# Patient Record
Sex: Male | Born: 1965 | Race: Black or African American | Hispanic: No | Marital: Married | Smoking: Former smoker
Health system: Southern US, Community
[De-identification: ages and names within clinical notes are randomized; demographics above are authoritative.]

## PROBLEM LIST (undated history)

## (undated) DIAGNOSIS — H30033 Focal chorioretinal inflammation, peripheral, bilateral: Secondary | ICD-10-CM

## (undated) DIAGNOSIS — H35063 Retinal vasculitis, bilateral: Secondary | ICD-10-CM

## (undated) DIAGNOSIS — K219 Gastro-esophageal reflux disease without esophagitis: Secondary | ICD-10-CM

## (undated) DIAGNOSIS — N400 Enlarged prostate without lower urinary tract symptoms: Secondary | ICD-10-CM

## (undated) DIAGNOSIS — T7840XA Allergy, unspecified, initial encounter: Secondary | ICD-10-CM

## (undated) DIAGNOSIS — D869 Sarcoidosis, unspecified: Secondary | ICD-10-CM

## (undated) DIAGNOSIS — N529 Male erectile dysfunction, unspecified: Secondary | ICD-10-CM

## (undated) DIAGNOSIS — M1990 Unspecified osteoarthritis, unspecified site: Secondary | ICD-10-CM

## (undated) DIAGNOSIS — H579 Unspecified disorder of eye and adnexa: Secondary | ICD-10-CM

## (undated) HISTORY — DX: Sarcoidosis, unspecified: D86.9

## (undated) HISTORY — DX: Unspecified osteoarthritis, unspecified site: M19.90

---

## 1999-04-09 ENCOUNTER — Emergency Department (HOSPITAL_COMMUNITY): Admission: EM | Admit: 1999-04-09 | Discharge: 1999-04-09 | Payer: Self-pay | Admitting: Emergency Medicine

## 2008-12-21 ENCOUNTER — Emergency Department (HOSPITAL_COMMUNITY): Admission: EM | Admit: 2008-12-21 | Discharge: 2008-12-21 | Payer: Self-pay | Admitting: Emergency Medicine

## 2010-05-07 LAB — RAPID STREP SCREEN (MED CTR MEBANE ONLY): Streptococcus, Group A Screen (Direct): POSITIVE — AB

## 2012-01-27 ENCOUNTER — Emergency Department (HOSPITAL_COMMUNITY)
Admission: EM | Admit: 2012-01-27 | Discharge: 2012-01-27 | Disposition: A | Discharge status: Home / Self Care | Payer: Self-pay | Attending: Emergency Medicine | Admitting: Emergency Medicine

## 2012-01-27 ENCOUNTER — Encounter (HOSPITAL_COMMUNITY): Payer: Self-pay | Admitting: Emergency Medicine

## 2012-01-27 DIAGNOSIS — Z20828 Contact with and (suspected) exposure to other viral communicable diseases: Secondary | ICD-10-CM | POA: Insufficient documentation

## 2012-01-27 DIAGNOSIS — Z202 Contact with and (suspected) exposure to infections with a predominantly sexual mode of transmission: Secondary | ICD-10-CM

## 2012-01-27 DIAGNOSIS — Z8619 Personal history of other infectious and parasitic diseases: Secondary | ICD-10-CM | POA: Insufficient documentation

## 2012-01-27 MED ORDER — LIDOCAINE HCL 2 % IJ SOLN
INTRAMUSCULAR | Status: AC
  Administered 2012-01-27: 20 mg
  Filled 2012-01-27: qty 20

## 2012-01-27 MED ORDER — AZITHROMYCIN 250 MG PO TABS
1000.0000 mg | ORAL_TABLET | Freq: Once | ORAL | Status: AC
  Administered 2012-01-27: 1000 mg via ORAL
  Filled 2012-01-27: qty 4

## 2012-01-27 MED ORDER — CEFTRIAXONE SODIUM 1 G IJ SOLR
1.0000 g | Freq: Once | INTRAMUSCULAR | Status: AC
  Administered 2012-01-27: 1 g via INTRAMUSCULAR
  Filled 2012-01-27: qty 10

## 2012-01-27 NOTE — ED Notes (Signed)
Pain on urination x 1 week. Reports cloudy discharge from penis

## 2012-01-27 NOTE — ED Provider Notes (Signed)
Medical screening examination/treatment/procedure(s) were performed by non-physician practitioner and as supervising physician I was immediately available for consultation/collaboration.  Yazlyn Wentzel, MD 01/27/12 1513 

## 2012-01-27 NOTE — ED Provider Notes (Signed)
History     CSN: [id number]  Arrival date & time 01/27/12  1217   First MD Initiated Contact with Patient 01/27/12 1233      Chief Complaint  Patient presents with  . Penile Discharge    1 week hx of discharge from penis    (Consider location/radiation/quality/duration/timing/severity/associated sxs/prior treatment) HPI Comments: 46 year old male presents emergency department with concern of sexually transmitted disease. States he was "messing around with the girl who slept with an ex who had STDs" last week. He is coming from the past and tested positive for both gonorrhea and Chlamydia. States he has cloudy penile discharge. Denies pain or tenderness. Despite triage summary patient denies dysuria. Denies increased frequency or urgency. No testicular pain or swelling. No fever or chills.  Patient is a 46 y.o. male presenting with penile discharge. The history is provided by the patient.  Penile Discharge Pertinent negatives include no abdominal pain, chills or fever.    No past medical history on file.  No past surgical history on file.  No family history on file.  History  Substance Use Topics  . Smoking status: Not on file  . Smokeless tobacco: Not on file  . Alcohol Use: Not on file      Review of Systems  Constitutional: Negative for fever and chills.  Gastrointestinal: Negative for abdominal pain.  Genitourinary: Positive for discharge. Negative for frequency, penile swelling, scrotal swelling, penile pain and testicular pain.  Skin: Negative.     Allergies  Review of patient's allergies indicates no known allergies.  Home Medications  No current outpatient prescriptions on file.  There were no vitals taken for this visit.  Physical Exam  Constitutional: He is oriented to person, place, and time. He appears well-developed and well-nourished. No distress.  HENT:  Head: Normocephalic and atraumatic.  Mouth/Throat: Oropharynx is clear and moist.  Eyes:  Conjunctivae normal are normal.  Neck: Normal range of motion. Neck supple.  Cardiovascular: Normal rate, regular rhythm and normal heart sounds.   Pulmonary/Chest: Effort normal and breath sounds normal.  Genitourinary: Penis normal. Right testis shows no swelling and no tenderness. Left testis shows no swelling and no tenderness. Circumcised. No penile erythema or penile tenderness. No discharge found.  Musculoskeletal: Normal range of motion.  Neurological: He is alert and oriented to person, place, and time.  Skin: Skin is warm and dry. No rash noted.  Psychiatric: He has a normal mood and affect. His behavior is normal.    ED Course  Procedures (including critical care time)   Labs Reviewed  GC/CHLAMYDIA PROBE AMP   No results found.   1. Exposure to STD       MDM  46 year old male with exposure to sexually transmitted disease. Penile cultures obtained. Patient received 1 g IM Rocephin and 1000 mg azithromycin. He is aware if results are positive he needs to tell his partner. Return precautions discussed.       Trevor Mace, PA-C 01/27/12 1257

## 2012-01-28 LAB — GC/CHLAMYDIA PROBE AMP
CT Probe RNA: NEGATIVE
GC Probe RNA: NEGATIVE

## 2012-02-10 ENCOUNTER — Encounter (HOSPITAL_COMMUNITY): Payer: Self-pay | Admitting: Emergency Medicine

## 2012-02-10 ENCOUNTER — Emergency Department (HOSPITAL_COMMUNITY)
Admission: EM | Admit: 2012-02-10 | Discharge: 2012-02-10 | Disposition: A | Discharge status: Home / Self Care | Payer: Self-pay | Attending: Emergency Medicine | Admitting: Emergency Medicine

## 2012-02-10 DIAGNOSIS — R300 Dysuria: Secondary | ICD-10-CM | POA: Insufficient documentation

## 2012-02-10 DIAGNOSIS — F17200 Nicotine dependence, unspecified, uncomplicated: Secondary | ICD-10-CM | POA: Insufficient documentation

## 2012-02-10 LAB — URINALYSIS, ROUTINE W REFLEX MICROSCOPIC
Nitrite: NEGATIVE
Specific Gravity, Urine: 1.023 (ref 1.005–1.030)
Urobilinogen, UA: 0.2 mg/dL (ref 0.0–1.0)
pH: 6.5 (ref 5.0–8.0)

## 2012-02-10 MED ORDER — DOXYCYCLINE HYCLATE 100 MG PO CAPS: 100.0000 mg | ORAL_CAPSULE | Freq: Two times a day (BID) | ORAL | Status: DC

## 2012-02-10 MED ORDER — DOXYCYCLINE HYCLATE 100 MG PO TABS: 100.0000 mg | ORAL_TABLET | Freq: Once | ORAL | Status: DC

## 2012-02-10 NOTE — ED Provider Notes (Signed)
Medical screening examination/treatment/procedure(s) were performed by non-physician practitioner and as supervising physician I was immediately available for consultation/collaboration.    Nelia Shi, MD 02/10/12 1350

## 2012-02-10 NOTE — ED Notes (Addendum)
Pt reports pressure and urgency on urination x 3 days

## 2012-02-10 NOTE — ED Provider Notes (Signed)
History     CSN: [id number]  Arrival date & time 02/10/12  1059   First MD Initiated Contact with Patient 02/10/12 1116      Chief Complaint  Patient presents with  . Penile Discharge    discharge via penis. denies sexual contact    (Consider location/radiation/quality/duration/timing/severity/associated sxs/prior treatment) HPI The patient presents to the emergency department with continued dysuria.  Patient states he thinks he needs more antibiotics.  Patient denies fever, nausea, vomiting, abdominal pain, back pain, diarrhea, headache, chest pain, or shortness of breath.  Patient states she did not take any other medications prior to arrival.  He says nothing seems to make his condition, better or worse. History reviewed. No pertinent past medical history.  History reviewed. No pertinent past surgical history.  Family History  Problem Relation Age of Onset  . Diabetes Mother     History  Substance Use Topics  . Smoking status: Current Every Day Smoker    Types: Cigarettes  . Smokeless tobacco: Not on file  . Alcohol Use: Yes      Review of Systems All other systems negative except as documented in the HPI. All pertinent positives and negatives as reviewed in the HPI. Allergies  Review of patient's allergies indicates no known allergies.  Home Medications  No current outpatient prescriptions on file.  BP 119/76  Pulse 84  Temp 97.9 F (36.6 C) (Oral)  Resp 18  SpO2 99%  Physical Exam  Constitutional: He is oriented to person, place, and time. He appears well-developed and well-nourished. No distress.  HENT:  Head: Normocephalic and atraumatic.  Mouth/Throat: Oropharynx is clear and moist.  Eyes: Pupils are equal, round, and reactive to light.  Neck: Normal range of motion. Neck supple.  Cardiovascular: Normal rate, regular rhythm and normal heart sounds.  Exam reveals no gallop and no friction rub.   No murmur heard. Pulmonary/Chest: Effort normal and  breath sounds normal.  Genitourinary: Penis normal. No penile tenderness.  Neurological: He is alert and oriented to person, place, and time.    ED Course  Procedures (including critical care time)  Patient will be treated with doxycycline and advised to return here as needed.  Will be given urology followup.  Told to increase his fluid intake, as much  As possible  MDM          Carlyle Dolly, PA-C 02/10/12 1325

## 2012-02-10 NOTE — ED Notes (Signed)
PA at bedside. Pt given explanation about need to antibiotic to help any residual sx of STD. Referal reviewed with pt. Pt markedly more relaxed

## 2012-02-10 NOTE — ED Notes (Signed)
Pt is very concerned about infection. Pt has multiple questions. Not satisfied with answers by this RN. Referred to PA

## 2013-01-30 ENCOUNTER — Emergency Department (HOSPITAL_BASED_OUTPATIENT_CLINIC_OR_DEPARTMENT_OTHER)
Admission: EM | Admit: 2013-01-30 | Discharge: 2013-01-30 | Disposition: A | Discharge status: Home / Self Care | Payer: Self-pay | Attending: Emergency Medicine | Admitting: Emergency Medicine

## 2013-01-30 ENCOUNTER — Encounter (HOSPITAL_BASED_OUTPATIENT_CLINIC_OR_DEPARTMENT_OTHER): Payer: Self-pay | Admitting: Emergency Medicine

## 2013-01-30 DIAGNOSIS — Z792 Long term (current) use of antibiotics: Secondary | ICD-10-CM | POA: Insufficient documentation

## 2013-01-30 DIAGNOSIS — Z0000 Encounter for general adult medical examination without abnormal findings: Secondary | ICD-10-CM

## 2013-01-30 DIAGNOSIS — Z711 Person with feared health complaint in whom no diagnosis is made: Secondary | ICD-10-CM | POA: Insufficient documentation

## 2013-01-30 DIAGNOSIS — F17200 Nicotine dependence, unspecified, uncomplicated: Secondary | ICD-10-CM | POA: Insufficient documentation

## 2013-01-30 LAB — URINALYSIS, ROUTINE W REFLEX MICROSCOPIC
Leukocytes, UA: NEGATIVE
Nitrite: NEGATIVE
Protein, ur: NEGATIVE mg/dL
Specific Gravity, Urine: 1.027 (ref 1.005–1.030)
Urobilinogen, UA: 1 mg/dL (ref 0.0–1.0)

## 2013-01-30 NOTE — ED Provider Notes (Signed)
CSN: [id number]     Arrival date & time 01/30/13  9562 History   First MD Initiated Contact with Patient 01/30/13 0300     Chief Complaint  Patient presents with  . Urinary Frequency   (Consider location/radiation/quality/duration/timing/severity/associated sxs/prior Treatment) Patient is a 47 y.o. male presenting with frequency. The history is provided by the patient.  Urinary Frequency This is a chronic problem. The current episode started more than 1 week ago (at least 2 months). The problem occurs constantly. The problem has not changed since onset.Pertinent negatives include no chest pain, no abdominal pain, no headaches and no shortness of breath. Nothing aggravates the symptoms. Nothing relieves the symptoms. He has tried nothing for the symptoms. The treatment provided no relief.  No flank pain no penile discharge no exposure to STI.  No fevers no foul smelling urine  History reviewed. No pertinent past medical history. History reviewed. No pertinent past surgical history. Family History  Problem Relation Age of Onset  . Diabetes Mother    History  Substance Use Topics  . Smoking status: Current Every Day Smoker    Types: Cigars  . Smokeless tobacco: Not on file  . Alcohol Use: 3.6 oz/week    6 Cans of beer per week    Review of Systems  Respiratory: Negative for shortness of breath.   Cardiovascular: Negative for chest pain.  Gastrointestinal: Negative for abdominal pain.  Genitourinary: Positive for frequency. Negative for dysuria, hematuria, discharge, scrotal swelling, penile pain and testicular pain.  Neurological: Negative for headaches.  All other systems reviewed and are negative.    Allergies  Review of patient's allergies indicates no known allergies.  Home Medications   Current Outpatient Rx  Name  Route  Sig  Dispense  Refill  . doxycycline (VIBRAMYCIN) 100 MG capsule   Oral   Take 1 capsule (100 mg total) by mouth 2 (two) times daily.   20  capsule   0    BP 138/84  Pulse 87  Temp(Src) 98.6 F (37 C) (Oral)  Resp 20  Ht 5\' 8"  (1.727 m)  Wt 190 lb (86.183 kg)  BMI 28.90 kg/m2  SpO2 98% Physical Exam  Constitutional: He is oriented to person, place, and time. He appears well-developed and well-nourished. No distress.  HENT:  Head: Normocephalic and atraumatic.  Mouth/Throat: Oropharynx is clear and moist.  Eyes: Conjunctivae are normal. Pupils are equal, round, and reactive to light.  Neck: Normal range of motion. Neck supple.  Cardiovascular: Normal rate, regular rhythm and intact distal pulses.   Pulmonary/Chest: Effort normal and breath sounds normal. He has no wheezes. He has no rales.  Abdominal: Soft. Bowel sounds are normal. There is no tenderness. There is no rebound and no guarding.  Musculoskeletal: Normal range of motion.  Neurological: He is alert and oriented to person, place, and time.  Skin: Skin is warm and dry.  Psychiatric: He has a normal mood and affect.    ED Course  Procedures (including critical care time) Labs Review Labs Reviewed  URINALYSIS, ROUTINE W REFLEX MICROSCOPIC   Imaging Review No results found.  EKG Interpretation   None       MDM  No diagnosis found. Post void residuals normal.  No UTI.  Follow up with urology for ongoing evaluation    Brooks Stotz K Myrene Bougher-Rasch, MD 01/30/13 [phone]

## 2013-01-30 NOTE — ED Notes (Signed)
Pt complains of frequent urination for 2 months.  Reports the he doesn't feel he is emptying his bladder completely.  Denies dysuria.

## 2015-12-05 ENCOUNTER — Emergency Department (HOSPITAL_COMMUNITY)
Admission: EM | Admit: 2015-12-05 | Discharge: 2015-12-05 | Disposition: A | Discharge status: Home / Self Care | Payer: Self-pay | Attending: Emergency Medicine | Admitting: Emergency Medicine

## 2015-12-05 ENCOUNTER — Emergency Department (HOSPITAL_COMMUNITY): Payer: Self-pay

## 2015-12-05 ENCOUNTER — Encounter (HOSPITAL_COMMUNITY): Payer: Self-pay | Admitting: Emergency Medicine

## 2015-12-05 DIAGNOSIS — R0789 Other chest pain: Secondary | ICD-10-CM | POA: Insufficient documentation

## 2015-12-05 LAB — COMPREHENSIVE METABOLIC PANEL
ALK PHOS: 68 U/L (ref 38–126)
ALT: 14 U/L — AB (ref 17–63)
AST: 18 U/L (ref 15–41)
Albumin: 3.4 g/dL — ABNORMAL LOW (ref 3.5–5.0)
Anion gap: 5 (ref 5–15)
BUN: 11 mg/dL (ref 6–20)
CALCIUM: 9.1 mg/dL (ref 8.9–10.3)
CO2: 27 mmol/L (ref 22–32)
CREATININE: 1.14 mg/dL (ref 0.61–1.24)
Chloride: 108 mmol/L (ref 101–111)
GFR calc non Af Amer: >60 mL/min (ref >60)
GLUCOSE: 94 mg/dL (ref 65–99)
Potassium: 4.7 mmol/L (ref 3.5–5.1)
SODIUM: 140 mmol/L (ref 135–145)
Total Bilirubin: 0.4 mg/dL (ref 0.3–1.2)
Total Protein: 6.6 g/dL (ref 6.5–8.1)

## 2015-12-05 LAB — CBC WITH DIFFERENTIAL/PLATELET
BASOS PCT: 0 %
Basophils Absolute: 0 10*3/uL (ref 0.0–0.1)
EOS ABS: 0.1 10*3/uL (ref 0.0–0.7)
EOS PCT: 2 %
HCT: 39.9 % (ref 39.0–52.0)
Hemoglobin: 12.8 g/dL — ABNORMAL LOW (ref 13.0–17.0)
Lymphocytes Relative: 40 %
Lymphs Abs: 2.3 10*3/uL (ref 0.7–4.0)
MCH: 26.7 pg (ref 26.0–34.0)
MCHC: 32.1 g/dL (ref 30.0–36.0)
MCV: 83.3 fL (ref 78.0–100.0)
MONO ABS: 0.5 10*3/uL (ref 0.1–1.0)
MONOS PCT: 9 %
NEUTROS PCT: 49 %
Neutro Abs: 2.8 10*3/uL (ref 1.7–7.7)
PLATELETS: 251 10*3/uL (ref 150–400)
RBC: 4.79 MIL/uL (ref 4.22–5.81)
RDW: 14.3 % (ref 11.5–15.5)
WBC: 5.8 10*3/uL (ref 4.0–10.5)

## 2015-12-05 LAB — I-STAT TROPONIN, ED: TROPONIN I, POC: 0 ng/mL (ref 0.00–0.08)

## 2015-12-05 LAB — TROPONIN I: Troponin I: <0.03 ng/mL (ref <0.03)

## 2015-12-05 MED ORDER — ASPIRIN 81 MG PO CHEW
324.0000 mg | CHEWABLE_TABLET | Freq: Once | ORAL | Status: AC
  Administered 2015-12-05: 324 mg via ORAL
  Filled 2015-12-05: qty 4

## 2015-12-05 MED ORDER — METHOCARBAMOL 500 MG PO TABS: 500.0000 mg | ORAL_TABLET | Freq: Two times a day (BID) | ORAL | 0 refills | Status: DC | PRN

## 2015-12-05 NOTE — Discharge Instructions (Signed)
Follow up with your primary care provider for discussion of today's diagnosis.  Robaxin is your muscle relaxer to take as needed. You can also take ibuprofen along with this medication.  Return to ER for new or worsening symptoms, any additional concerns.

## 2015-12-05 NOTE — ED Provider Notes (Signed)
[facility]-EMERGENCY DEPT Provider Note   CSN: [id number] Arrival date & time: 12/05/15  1202     History   Chief Complaint Chief Complaint  Patient presents with  . Chest Pain    HPI Nathan [name] is a 50 y.o. male.  The history is provided by the patient. No language interpreter was used.  Chest Pain   This is a new problem. The problem occurs constantly. The problem has been gradually worsening. The pain is moderate. The pain does not radiate. Pertinent negatives include no cough and no shortness of breath. He has tried nothing for the symptoms. The treatment provided no relief. There are no known risk factors.  Pt complains of pain in his right chest that started this morning.  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    History reviewed. No pertinent past medical history.  There are no active problems to display for this patient.   History reviewed. No pertinent surgical history.     Home Medications    Prior to Admission medications   Not on File    Family History History reviewed. No pertinent family history.  Social History Social History  Substance Use Topics  . Smoking status: Not on file  . Smokeless tobacco: Not on file  . Alcohol use Not on file     Allergies   Review of patient's allergies indicates no known allergies.   Review of Systems Review of Systems  Respiratory: Negative for cough and shortness of breath.   Cardiovascular: Positive for chest pain.  All other systems reviewed and are negative.    Physical Exam Updated Vital Signs BP 146/92 (BP Location: Right Arm)   Pulse (!) 55   Temp 98.4 F (36.9 C) (Oral)   Resp 19   SpO2 100%   Physical Exam  Constitutional: He appears well-developed and well-nourished.  HENT:  Head: Normocephalic and atraumatic.  Eyes: Conjunctivae are normal.  Neck: Neck supple.  Cardiovascular: Normal rate and regular rhythm.   No murmur heard. asmetry chest. Right chest larger/firmer chest  muscle  ? Spasm.    Pulmonary/Chest: Effort normal and breath sounds normal. No respiratory distress.  Abdominal: Soft. There is no tenderness.  Musculoskeletal: He exhibits no edema.  Neurological: He is alert.  Skin: Skin is warm and dry.  Psychiatric: He has a normal mood and affect.  Nursing note and vitals reviewed.    ED Treatments / Results  Labs (all labs ordered are listed, but only abnormal results are displayed) Labs Reviewed  COMPREHENSIVE METABOLIC PANEL - Abnormal; Notable for the following:       Result Value   Albumin 3.4 (*)    ALT 14 (*)    All other components within normal limits  CBC WITH DIFFERENTIAL/PLATELET - Abnormal; Notable for the following:    Hemoglobin 12.8 (*)    All other components within normal limits  TROPONIN I  I-STAT TROPOININ, ED    EKG  EKG Interpretation  Date/Time:  Thursday December 05 2015 12:06:40 EDT Ventricular Rate:  61 PR Interval:  122 QRS Duration: 90 QT Interval:  390 QTC Calculation: 392 R Axis:   74 Text Interpretation:  Normal sinus rhythm Artifact Abnormal ekg Confirmed by Gerhard MunchLOCKWOOD, ROBERT  MD [phone]([id number]) on 12/05/2015 12:13:36 PM       Radiology Dg Chest 2 View  Result Date: 12/05/2015 CLINICAL DATA:  Chest pain EXAM: CHEST  2 VIEW COMPARISON:  None. FINDINGS: The heart size and mediastinal contours are within  normal limits. Both lungs are clear. The visualized skeletal structures are unremarkable. IMPRESSION: No active cardiopulmonary disease. Electronically Signed   By: Marlan Palau M.D.   On: 12/05/2015 12:47    Procedures Procedures (including critical care time)  Medications Ordered in ED Medications  aspirin chewable tablet 324 mg (not administered)     Initial Impression / Assessment and Plan / ED Course  I have reviewed the triage vital signs and the nursing notes.  Pertinent labs & imaging results that were available during my care of the patient were reviewed by me and considered in my medical  decision making (see chart for details).  Clinical Course    I suspect pain is muscular.  Pt appears to have right sides chest muscle spasm.  Final Clinical Impressions(s) / ED Diagnoses   Final diagnoses:  Chest wall pain    New Prescriptions New Prescriptions   No medications on file     Elson Areas, PA-C 12/05/15 1558    Charlynne Pander, MD 12/06/15 [phone]

## 2015-12-05 NOTE — ED Triage Notes (Addendum)
Pt states chest pain on right side, nonradiating with SOB starting this morning. Denies dizziness or nausea. Nonsmoker. Reports had another episode similar but could not find anything on stress test few years ago. Does do some heavy lifting for job. Denies cough. Does get worse with movement.

## 2015-12-05 NOTE — ED Notes (Signed)
Pt verbalized understanding of d/c instructions and has no further questions. Pt stable and NAD. Pt d/c home with wife. Pt ambulatory, VSS.

## 2017-11-15 ENCOUNTER — Other Ambulatory Visit: Payer: Self-pay | Admitting: Family Medicine

## 2017-11-15 ENCOUNTER — Ambulatory Visit
Admission: RE | Admit: 2017-11-15 | Discharge: 2017-11-15 | Disposition: A | Discharge status: Home / Self Care | Payer: Managed Care, Other (non HMO) | Source: Ambulatory Visit | Attending: Family Medicine | Admitting: Family Medicine

## 2017-11-15 DIAGNOSIS — H3581 Retinal edema: Secondary | ICD-10-CM

## 2019-06-30 ENCOUNTER — Emergency Department (HOSPITAL_BASED_OUTPATIENT_CLINIC_OR_DEPARTMENT_OTHER)
Admission: EM | Admit: 2019-06-30 | Discharge: 2019-06-30 | Disposition: A | Discharge status: Home / Self Care | Payer: Commercial Managed Care - PPO | Attending: Emergency Medicine | Admitting: Emergency Medicine

## 2019-06-30 ENCOUNTER — Emergency Department (HOSPITAL_BASED_OUTPATIENT_CLINIC_OR_DEPARTMENT_OTHER): Payer: Commercial Managed Care - PPO

## 2019-06-30 ENCOUNTER — Other Ambulatory Visit: Payer: Self-pay

## 2019-06-30 ENCOUNTER — Encounter (HOSPITAL_BASED_OUTPATIENT_CLINIC_OR_DEPARTMENT_OTHER): Payer: Self-pay | Admitting: *Deleted

## 2019-06-30 DIAGNOSIS — F17290 Nicotine dependence, other tobacco product, uncomplicated: Secondary | ICD-10-CM | POA: Insufficient documentation

## 2019-06-30 DIAGNOSIS — S2241XA Multiple fractures of ribs, right side, initial encounter for closed fracture: Secondary | ICD-10-CM | POA: Diagnosis not present

## 2019-06-30 DIAGNOSIS — Y999 Unspecified external cause status: Secondary | ICD-10-CM | POA: Diagnosis not present

## 2019-06-30 DIAGNOSIS — Y9289 Other specified places as the place of occurrence of the external cause: Secondary | ICD-10-CM | POA: Insufficient documentation

## 2019-06-30 DIAGNOSIS — R079 Chest pain, unspecified: Secondary | ICD-10-CM

## 2019-06-30 DIAGNOSIS — S270XXA Traumatic pneumothorax, initial encounter: Secondary | ICD-10-CM | POA: Insufficient documentation

## 2019-06-30 DIAGNOSIS — Y9389 Activity, other specified: Secondary | ICD-10-CM | POA: Diagnosis not present

## 2019-06-30 DIAGNOSIS — S299XXA Unspecified injury of thorax, initial encounter: Secondary | ICD-10-CM | POA: Diagnosis present

## 2019-06-30 MED ORDER — OXYCODONE-ACETAMINOPHEN 5-325 MG PO TABS
2.0000 | ORAL_TABLET | Freq: Once | ORAL | Status: AC
  Administered 2019-06-30: 2 via ORAL
  Filled 2019-06-30: qty 2

## 2019-06-30 MED ORDER — OXYCODONE-ACETAMINOPHEN 5-325 MG PO TABS
1.0000 | ORAL_TABLET | Freq: Once | ORAL | Status: AC
  Administered 2019-06-30: 1 via ORAL
  Filled 2019-06-30: qty 1

## 2019-06-30 MED ORDER — OXYCODONE-ACETAMINOPHEN 5-325 MG PO TABS: 1.0000 | ORAL_TABLET | Freq: Four times a day (QID) | ORAL | 0 refills | Status: DC | PRN

## 2019-06-30 MED ORDER — OXYCODONE-ACETAMINOPHEN 5-325 MG PO TABS
1.0000 | ORAL_TABLET | ORAL | Status: DC | PRN
  Administered 2019-06-30: 1 via ORAL
  Filled 2019-06-30: qty 1
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
## 2019-07-05 ENCOUNTER — Emergency Department (HOSPITAL_BASED_OUTPATIENT_CLINIC_OR_DEPARTMENT_OTHER): Payer: Commercial Managed Care - PPO

## 2019-07-05 ENCOUNTER — Emergency Department (HOSPITAL_BASED_OUTPATIENT_CLINIC_OR_DEPARTMENT_OTHER)
Admission: EM | Admit: 2019-07-05 | Discharge: 2019-07-05 | Disposition: A | Discharge status: Home / Self Care | Payer: Commercial Managed Care - PPO | Attending: Emergency Medicine | Admitting: Emergency Medicine

## 2019-07-05 ENCOUNTER — Other Ambulatory Visit: Payer: Self-pay

## 2019-07-05 ENCOUNTER — Encounter (HOSPITAL_BASED_OUTPATIENT_CLINIC_OR_DEPARTMENT_OTHER): Payer: Self-pay

## 2019-07-05 DIAGNOSIS — S2241XD Multiple fractures of ribs, right side, subsequent encounter for fracture with routine healing: Secondary | ICD-10-CM | POA: Diagnosis not present

## 2019-07-05 DIAGNOSIS — R0602 Shortness of breath: Secondary | ICD-10-CM | POA: Diagnosis present

## 2019-07-05 DIAGNOSIS — S2241XA Multiple fractures of ribs, right side, initial encounter for closed fracture: Secondary | ICD-10-CM

## 2019-07-05 DIAGNOSIS — F17210 Nicotine dependence, cigarettes, uncomplicated: Secondary | ICD-10-CM | POA: Insufficient documentation

## 2019-07-05 MED ORDER — KETOROLAC TROMETHAMINE 60 MG/2ML IM SOLN
60.0000 mg | Freq: Once | INTRAMUSCULAR | Status: DC
  Filled 2019-07-05: qty 2

## 2019-07-05 MED ORDER — OXYCODONE-ACETAMINOPHEN 5-325 MG PO TABS: 1.0000 | ORAL_TABLET | ORAL | 0 refills | Status: DC | PRN

## 2019-07-05 MED ORDER — HYDROMORPHONE HCL 1 MG/ML IJ SOLN: 1.0000 mg | Freq: Once | INTRAMUSCULAR | Status: DC
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
## 2019-07-05 NOTE — ED Notes (Signed)
ED Provider at bedside. 

## 2019-07-05 NOTE — ED Triage Notes (Signed)
States SOB since he was seen here last Friday, states coughing up thick mucus, chest is sore from coughing and has a 'knot like feeling'. 99% on RA

## 2019-07-06 ENCOUNTER — Encounter: Payer: Self-pay | Admitting: Family Medicine

## 2019-07-06 ENCOUNTER — Ambulatory Visit (INDEPENDENT_AMBULATORY_CARE_PROVIDER_SITE_OTHER): Payer: Commercial Managed Care - PPO | Admitting: Family Medicine

## 2019-07-06 ENCOUNTER — Ambulatory Visit: Payer: Self-pay | Attending: Internal Medicine

## 2019-07-06 VITALS — BP 117/80 | HR 92 | Ht 67.0 in | Wt 200.0 lb

## 2019-07-06 DIAGNOSIS — Z23 Encounter for immunization: Secondary | ICD-10-CM

## 2019-07-06 DIAGNOSIS — S270XXA Traumatic pneumothorax, initial encounter: Secondary | ICD-10-CM

## 2019-07-06 DIAGNOSIS — S2241XA Multiple fractures of ribs, right side, initial encounter for closed fracture: Secondary | ICD-10-CM | POA: Insufficient documentation

## 2019-07-06 MED ORDER — LIDOCAINE 5 % EX PTCH: 1.0000 | MEDICATED_PATCH | Freq: Two times a day (BID) | CUTANEOUS | 2 refills | Status: AC

## 2019-07-06 NOTE — Progress Notes (Signed)
   Covid-19 Vaccination Clinic  Name:  RAY GERVASI    MRN: [id number] DOB: 10-Jun-1965  07/06/2019  Mr. Pollio was observed post Covid-19 immunization for 15 minutes without incident. He was provided with Vaccine Information Sheet and instruction to access the V-Safe system.   Mr. Mcdermid was instructed to call 911 with any severe reactions post vaccine: [name] Kitchen Difficulty breathing  . Swelling of face and throat  . A fast heartbeat  . A bad rash all over body  . Dizziness and weakness   Immunizations Administered    Name Date Dose VIS Date Route   Pfizer COVID-19 Vaccine 07/06/2019  8:59 AM 0.3 mL 03/29/2018 Intramuscular   Manufacturer: [employer], [employer]   Lot: [id number]   NDC: [address]-2
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
## 2019-07-06 NOTE — Patient Instructions (Signed)
Nice to meet you Please try the rib belt  Please try ice Please be seen in the emergency department if you develop shortness of breath or have trouble catching your breath   Please send me a message in MyChart with any questions or updates.  Please see me back in 2 weeks.   --Dr. Jordan Likes

## 2019-07-20 ENCOUNTER — Ambulatory Visit (INDEPENDENT_AMBULATORY_CARE_PROVIDER_SITE_OTHER): Payer: Commercial Managed Care - PPO | Admitting: Family Medicine

## 2019-07-20 ENCOUNTER — Ambulatory Visit (HOSPITAL_BASED_OUTPATIENT_CLINIC_OR_DEPARTMENT_OTHER)
Admission: RE | Admit: 2019-07-20 | Discharge: 2019-07-20 | Disposition: A | Discharge status: Home / Self Care | Payer: Commercial Managed Care - PPO | Source: Ambulatory Visit | Attending: Family Medicine | Admitting: Family Medicine

## 2019-07-20 ENCOUNTER — Other Ambulatory Visit: Payer: Self-pay

## 2019-07-20 ENCOUNTER — Encounter: Payer: Self-pay | Admitting: Family Medicine

## 2019-07-20 VITALS — BP 131/99 | HR 80 | Ht 67.0 in | Wt 205.0 lb

## 2019-07-20 DIAGNOSIS — S2241XD Multiple fractures of ribs, right side, subsequent encounter for fracture with routine healing: Secondary | ICD-10-CM | POA: Diagnosis present

## 2019-07-20 DIAGNOSIS — S270XXD Traumatic pneumothorax, subsequent encounter: Secondary | ICD-10-CM | POA: Diagnosis not present

## 2019-07-20 MED ORDER — OXYCODONE-ACETAMINOPHEN 5-325 MG PO TABS
1.0000 | ORAL_TABLET | ORAL | 0 refills | Status: AC | PRN
Start: 2019-07-20 — End: ?
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
## 2019-07-20 NOTE — Assessment & Plan Note (Signed)
No shortness of breath. -X-ray
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
## 2019-07-20 NOTE — Patient Instructions (Signed)
Good to see you Please try placing the lidocaine patches higher up and on the side.  Please try ice  Please try to avoid taking shallow breaths  I will call with the xrays today   Please send me a message in MyChart with any questions or updates.  Please see me back in 2 weeks.   --Dr. Jordan Likes

## 2019-07-20 NOTE — Progress Notes (Signed)
Medication Samples have been provided to the patient.  Drug name: Duexis       Strength: 800mg /26.6mg         Qty: 3 Boxes  LOT:  Exp.Date: 03/2020  Dosing instructions: Take 1 tablet by mouth three (3) times a day.  The patient has been instructed regarding the correct time, dose, and frequency of taking this medication, including desired effects and most common side effects.   04/2020, Kathi Simpers 1:34 PM 07/20/2019

## 2019-07-21 ENCOUNTER — Telehealth: Payer: Self-pay | Admitting: Family Medicine

## 2019-07-21 NOTE — Telephone Encounter (Signed)
Informed of results.   Myra Rude, MD [facility] 07/21/2019, 9:50 AM

## 2019-07-27 ENCOUNTER — Ambulatory Visit: Payer: Self-pay | Attending: Internal Medicine

## 2019-07-27 DIAGNOSIS — Z23 Encounter for immunization: Secondary | ICD-10-CM

## 2019-07-27 NOTE — Progress Notes (Signed)
   Covid-19 Vaccination Clinic  Name:  Nathan [name]    MRN: [id number] DOB: 1965-12-01  07/27/2019  Mr. [name] was observed post Covid-19 immunization for 15 minutes without incident. He was provided with Vaccine Information Sheet and instruction to access the V-Safe system.   Mr. [name] was instructed to call 911 with any severe reactions post vaccine: [name] Kitchen Difficulty breathing  . Swelling of face and throat  . A fast heartbeat  . A bad rash all over body  . Dizziness and weakness   Immunizations Administered    Name Date Dose VIS Date Route   Pfizer COVID-19 Vaccine 07/27/2019  8:55 AM 0.3 mL 03/29/2018 Intramuscular   Manufacturer: [employer], [employer]   Lot: [id number]   NDC: [address]-6

## 2019-08-03 ENCOUNTER — Ambulatory Visit (INDEPENDENT_AMBULATORY_CARE_PROVIDER_SITE_OTHER): Payer: Commercial Managed Care - PPO | Admitting: Family Medicine

## 2019-08-03 ENCOUNTER — Other Ambulatory Visit: Payer: Self-pay

## 2019-08-03 ENCOUNTER — Encounter: Payer: Self-pay | Admitting: Family Medicine

## 2019-08-03 DIAGNOSIS — S2241XD Multiple fractures of ribs, right side, subsequent encounter for fracture with routine healing: Secondary | ICD-10-CM

## 2019-08-03 NOTE — Progress Notes (Signed)
  David Gibson. - 54 y.o. male MRN [id number]  Date of birth: 05-Jan-1966  SUBJECTIVE:  Including CC & ROS.  Chief Complaint  Patient presents with  . Follow-up    ribs on right side    [name]. is a 54 y.o. male that is following up for his rib pain.  His pain is improved significantly.  The Duexis seems to be helping.  He denies any shortness of breath.  He still notices a twinge from time to time but overall his pain is improved significantly..  Independent review of the rib and chest x-ray from 6/17 shows ongoing rib fractures and no pneumothorax.   Review of Systems See HPI   HISTORY: Past Medical, Surgical, Social, and Family History Reviewed & Updated per EMR.   Pertinent Historical Findings include:  No past medical history on file.  No past surgical history on file.  Family History  Problem Relation Age of Onset  . Diabetes Mother     Social History   Socioeconomic History  . Marital status: Single    Spouse name: Not on file  . Number of children: Not on file  . Years of education: Not on file  . Highest education level: Not on file  Occupational History  . Not on file  Tobacco Use  . Smoking status: Current Every Day Smoker    Types: Cigars  . Smokeless tobacco: Never Used  Substance and Sexual Activity  . Alcohol use: Yes    Alcohol/week: 6.0 standard drinks    Types: 6 Cans of beer per week  . Drug use: Yes    Types: Marijuana  . Sexual activity: Not on file  Other Topics Concern  . Not on file  Social History Narrative  . Not on file   Social Determinants of Health   Financial Resource Strain:   . Difficulty of Paying Living Expenses:   Food Insecurity:   . Worried About Programme researcher, broadcasting/film/video in the Last Year:   . Barista in the Last Year:   Transportation Needs:   . Freight forwarder (Medical):   [name] Kitchen Lack of Transportation (Non-Medical):   Physical Activity:   . Days of Exercise per Week:   . Minutes  of Exercise per Session:   Stress:   . Feeling of Stress :   Social Connections:   . Frequency of Communication with Friends and Family:   . Frequency of Social Gatherings with Friends and Family:   . Attends Religious Services:   . Active Member of Clubs or Organizations:   . Attends Banker Meetings:   [name] Kitchen Marital Status:   Intimate Partner Violence:   . Fear of Current or Ex-Partner:   . Emotionally Abused:   [name] Kitchen Physically Abused:   . Sexually Abused:      PHYSICAL EXAM:  VS: BP 120/81   Pulse 62   Ht 5\' 8"  (1.727 m)   Wt 214 lb (97.1 kg)   BMI 32.54 kg/m  Physical Exam Gen: NAD, alert, cooperative with exam, well-appearing    ASSESSMENT & PLAN:   Multiple closed fractures of ribs of right side Pain is improved quite significantly since his initial injury.  Initially was injured on 5/28. -Counseled on home exercise therapy and supportive care. -Duexis samples. -Provided work note.
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
## 2019-08-03 NOTE — Progress Notes (Signed)
Medication Samples have been provided to the patient.  Drug name: Duexis       Strength: 800mg /26.6mg         Qty: 2 boxes  LOT:  Exp.Date: 04/2020  Dosing instructions: take 1 tablet by mouth three (3) times a day.  The patient has been instructed regarding the correct time, dose, and frequency of taking this medication, including desired effects and most common side effects.   05/2020, Kathi Simpers 11:24 AM 08/03/2019

## 2019-08-03 NOTE — Patient Instructions (Signed)
Good to see you Please use ice as needed  Please try the duexis as needed   Please send me a message in MyChart with any questions or updates.  Please see [place] back as needed.   --Dr. Jordan Likes

## 2020-02-15 ENCOUNTER — Ambulatory Visit: Payer: Self-pay | Attending: Internal Medicine

## 2020-02-15 DIAGNOSIS — Z23 Encounter for immunization: Secondary | ICD-10-CM

## 2020-02-15 NOTE — Progress Notes (Signed)
   Covid-19 Vaccination Clinic  Name:  Nathan [name]    MRN: [id number] DOB: 07-Apr-1965  02/15/2020  [name] was observed post Covid-19 immunization for 15 minutes without incident. He was provided with Vaccine Information Sheet and instruction to access the V-Safe system.   [name] was instructed to call 911 with any severe reactions post vaccine: [name] Kitchen Difficulty breathing  . Swelling of face and throat  . A fast heartbeat  . A bad rash all over body  . Dizziness and weakness   Immunizations Administered    Name Date Dose VIS Date Route   Pfizer COVID-19 Vaccine 02/15/2020  3:54 PM 0.3 mL 11/22/2019 Intramuscular   Manufacturer: [employer], [employer]   Lot: [id number]   NDC: [address]-1

## 2020-03-19 ENCOUNTER — Emergency Department (HOSPITAL_BASED_OUTPATIENT_CLINIC_OR_DEPARTMENT_OTHER): Payer: Self-pay

## 2020-03-19 ENCOUNTER — Other Ambulatory Visit: Payer: Self-pay

## 2020-03-19 ENCOUNTER — Emergency Department (HOSPITAL_COMMUNITY)
Admission: EM | Admit: 2020-03-19 | Discharge: 2020-03-19 | Disposition: A | Discharge status: Home / Self Care | Payer: Self-pay | Attending: Emergency Medicine | Admitting: Emergency Medicine

## 2020-03-19 ENCOUNTER — Encounter (HOSPITAL_COMMUNITY): Payer: Self-pay | Admitting: Emergency Medicine

## 2020-03-19 ENCOUNTER — Other Ambulatory Visit (HOSPITAL_COMMUNITY): Payer: Self-pay | Admitting: Emergency Medicine

## 2020-03-19 DIAGNOSIS — R609 Edema, unspecified: Secondary | ICD-10-CM

## 2020-03-19 DIAGNOSIS — M7989 Other specified soft tissue disorders: Secondary | ICD-10-CM

## 2020-03-19 DIAGNOSIS — M7122 Synovial cyst of popliteal space [Baker], left knee: Secondary | ICD-10-CM | POA: Insufficient documentation

## 2020-03-19 DIAGNOSIS — F17290 Nicotine dependence, other tobacco product, uncomplicated: Secondary | ICD-10-CM | POA: Insufficient documentation

## 2020-03-19 LAB — CBC
HCT: 46.3 % (ref 39.0–52.0)
Hemoglobin: 13.4 g/dL (ref 13.0–17.0)
MCH: 23.8 pg — ABNORMAL LOW (ref 26.0–34.0)
MCHC: 28.9 g/dL — ABNORMAL LOW (ref 30.0–36.0)
MCV: 82.1 fL (ref 80.0–100.0)
Platelets: 247 10*3/uL (ref 150–400)
RBC: 5.64 MIL/uL (ref 4.22–5.81)
RDW: 15.4 % (ref 11.5–15.5)
WBC: 4.8 10*3/uL (ref 4.0–10.5)
nRBC: 0 % (ref 0.0–0.2)

## 2020-03-19 LAB — BASIC METABOLIC PANEL
Anion gap: 8 (ref 5–15)
BUN: 13 mg/dL (ref 6–20)
CO2: 25 mmol/L (ref 22–32)
Calcium: 9.3 mg/dL (ref 8.9–10.3)
Chloride: 108 mmol/L (ref 98–111)
Creatinine, Ser: 0.92 mg/dL (ref 0.61–1.24)
GFR, Estimated: >60 mL/min (ref >60)
Glucose, Bld: 65 mg/dL — ABNORMAL LOW (ref 70–99)
Potassium: 4 mmol/L (ref 3.5–5.1)
Sodium: 141 mmol/L (ref 135–145)

## 2020-03-19 MED ORDER — NAPROXEN 500 MG PO TABS: 500.0000 mg | ORAL_TABLET | Freq: Three times a day (TID) | ORAL | 0 refills | Status: AC

## 2020-03-19 MED ORDER — OXYCODONE-ACETAMINOPHEN 5-325 MG PO TABS
1.0000 | ORAL_TABLET | Freq: Once | ORAL | Status: AC
  Administered 2020-03-19: 1 via ORAL
  Filled 2020-03-19: qty 1

## 2020-03-19 MED ORDER — NAPROXEN 500 MG PO TABS: 500.0000 mg | ORAL_TABLET | Freq: Three times a day (TID) | ORAL | 0 refills | Status: DC

## 2020-03-19 NOTE — ED Provider Notes (Signed)
[facility] EMERGENCY DEPARTMENT Provider Note   CSN: [id number] Arrival date & time: 03/19/20  1041     History Chief Complaint  Patient presents with  . Leg Pain    David Gibson. is a 55 y.o. male with no significant past medical history presents to the ED for evaluation of leg pain.  Left-sided, posterior leg, constant.  Associated with swelling.  Onset in December but worsening over the last 1 month.  States he finally came to the ED because he started of pain.  Pain is worse with movement, extension of the leg, walking, palpation.  Has taken some over-the-counter pain reliever without sustained relief.  Reports in the middle of December 2021 he was at work and had an accidental injury.  He was moving a ladder on wheels when it fell and hit him on his lower tibia bilaterally.  He developed a bruise and swelling on both shins.  States the swelling and bruising started to improve but in the last 1 month he noticed that his left leg started swelling up again and hurting.  Denies distal tingling, numbness.  No redness, warmth.  No chest pain, shortness of breath, hemoptysis.  No history of DVT or PE.  No recent surgery, hormone therapy, prolonged sitting.  HPI     History reviewed. No pertinent past medical history.  Patient Active Problem List   Diagnosis Date Noted  . Multiple closed fractures of ribs of right side 07/06/2019  . Pneumothorax, traumatic 07/06/2019    History reviewed. No pertinent surgical history.     Family History  Problem Relation Age of Onset  . Diabetes Mother     Social History   Tobacco Use  . Smoking status: Current Every Day Smoker    Types: Cigars  . Smokeless tobacco: Never Used  Substance Use Topics  . Alcohol use: Yes    Alcohol/week: 6.0 standard drinks    Types: 6 Cans of beer per week  . Drug use: Yes    Types: Marijuana    Home Medications Prior to Admission medications   Medication Sig Start Date  End Date Taking? Authorizing Provider  lidocaine (LIDODERM) 5 % Place 1 patch onto the skin every 12 (twelve) hours. Remove & Discard patch within 12 hours or as directed by MD 07/06/19   Myra Rude, MD  naproxen (NAPROSYN) 500 MG tablet Take 1 tablet (500 mg total) by mouth 3 (three) times daily with meals for 15 days. 03/19/20 04/03/20  Liberty Handy, PA-C  oxyCODONE-acetaminophen (PERCOCET) 5-325 MG tablet Take 1-2 tablets by mouth every 4 (four) hours as needed for severe pain. 07/20/19   Myra Rude, MD    Allergies    Patient has no known allergies.  Review of Systems   Review of Systems  Cardiovascular: Positive for leg swelling.  Musculoskeletal: Positive for gait problem.  All other systems reviewed and are negative.   Physical Exam Updated Vital Signs BP (!) 146/97 (BP Location: Left Arm)   Pulse (!) 57   Temp 98.6 F (37 C) (Oral)   Resp (!) 21   SpO2 98%   Physical Exam Vitals and nursing note reviewed.  Constitutional:      General: He is not in acute distress.    Appearance: He is well-developed and well-nourished.     Comments: NAD.  HENT:     Head: Normocephalic and atraumatic.     Right Ear: External ear normal.  Left Ear: External ear normal.     Nose: Nose normal.  Eyes:     General: No scleral icterus.    Extraocular Movements: EOM normal.     Conjunctiva/sclera: Conjunctivae normal.  Cardiovascular:     Rate and Rhythm: Normal rate and regular rhythm.     Pulses: Intact distal pulses.          Dorsalis pedis pulses are 1+ on the right side and 1+ on the left side.     Heart sounds: Normal heart sounds. No murmur heard.     Comments: Nonpitting edema in the left leg from the knee down to the dorsal foot.  Tenderness in the proximal calf and popliteal space.  Noted left medial thigh varicose veins, patient states these are new but does not know how long they have been there.  No skin redness, warmth.  Right leg normal. Pulmonary:      Effort: Pulmonary effort is normal.     Breath sounds: Normal breath sounds. No wheezing.  Musculoskeletal:        General: No deformity. Normal range of motion.     Cervical back: Normal range of motion and neck supple.     Comments: No reproducible tenderness over bony prominences of the tib/fib bilaterally.  No focal bony tenderness of the knees or ankles.  Full range of motion of these joints bilaterally.  Skin:    General: Skin is warm and dry.     Capillary Refill: Capillary refill takes less than 2 seconds.  Neurological:     Mental Status: He is alert and oriented to person, place, and time.     Comments: Sensation and strength intact in lower extremities bilaterally  Psychiatric:        Mood and Affect: Mood and affect normal.        Behavior: Behavior normal.        Thought Content: Thought content normal.        Judgment: Judgment normal.     ED Results / Procedures / Treatments   Labs (all labs ordered are listed, but only abnormal results are displayed) Labs Reviewed  BASIC METABOLIC PANEL - Abnormal; Notable for the following components:      Result Value   Glucose, Bld 65 (*)    All other components within normal limits  CBC - Abnormal; Notable for the following components:   MCH 23.8 (*)    MCHC 28.9 (*)    All other components within normal limits    EKG None  Radiology VAS [place] LOWER EXTREMITY VENOUS (DVT) ([facility] 7a-7p)  Result Date: 03/19/2020  Lower Venous DVT Study Indications: Edema, and Swelling.  Comparison Study: no prior Performing Technologist: Blanch Media RVS  Examination Guidelines: A complete evaluation includes B-mode imaging, spectral Doppler, color Doppler, and power Doppler as needed of all accessible portions of each vessel. Bilateral testing is considered an integral part of a complete examination. Limited examinations for reoccurring indications may be performed as noted. The reflux portion of the exam is performed with the patient  in reverse Trendelenburg.  +---------+---------------+---------+-----------+----------+--------------+ RIGHT    CompressibilityPhasicitySpontaneityPropertiesThrombus Aging +---------+---------------+---------+-----------+----------+--------------+ CFV      Full           Yes      Yes                                 +---------+---------------+---------+-----------+----------+--------------+ SFJ  Full                                                        +---------+---------------+---------+-----------+----------+--------------+ FV Prox  Full                                                        +---------+---------------+---------+-----------+----------+--------------+ FV Mid   Full                                                        +---------+---------------+---------+-----------+----------+--------------+ FV DistalFull                                                        +---------+---------------+---------+-----------+----------+--------------+ PFV      Full                                                        +---------+---------------+---------+-----------+----------+--------------+ POP      Full           Yes      Yes                                 +---------+---------------+---------+-----------+----------+--------------+ PTV      Full                                                        +---------+---------------+---------+-----------+----------+--------------+ PERO     Full                                                        +---------+---------------+---------+-----------+----------+--------------+   +---------+---------------+---------+-----------+----------+--------------+ LEFT     CompressibilityPhasicitySpontaneityPropertiesThrombus Aging +---------+---------------+---------+-----------+----------+--------------+ CFV      Full           Yes      Yes                                  +---------+---------------+---------+-----------+----------+--------------+ SFJ      Full                                                        +---------+---------------+---------+-----------+----------+--------------+  FV Prox  Full                                                        +---------+---------------+---------+-----------+----------+--------------+ FV Mid   Full                                                        +---------+---------------+---------+-----------+----------+--------------+ FV DistalFull                                                        +---------+---------------+---------+-----------+----------+--------------+ PFV      Full                                                        +---------+---------------+---------+-----------+----------+--------------+ POP      Full           Yes      Yes                                 +---------+---------------+---------+-----------+----------+--------------+ PTV      Full                                                        +---------+---------------+---------+-----------+----------+--------------+ PERO     Full                                                        +---------+---------------+---------+-----------+----------+--------------+     Summary: RIGHT: - There is no evidence of deep vein thrombosis in the lower extremity.  - No cystic structure found in the popliteal fossa.  LEFT: - There is no evidence of deep vein thrombosis in the lower extremity.  - A cystic structure is found in the popliteal fossa.  *See table(s) above for measurements and observations.    Preliminary     Procedures Procedures   Medications Ordered in ED Medications  oxyCODONE-acetaminophen (PERCOCET/ROXICET) 5-325 MG per tablet 1 tablet (1 tablet Oral Given 03/19/20 1508)    ED Course  I have reviewed the triage vital signs and the nursing notes.  Pertinent labs & imaging results that were  available during my care of the patient were reviewed by me and considered in my medical decision making (see chart for details).    MDM Rules/Calculators/A&P                           55 y.o.  yo with chief complaint of left leg swelling and pain. Preceding tibia injury months prior.   Previous medical records available, triage and nursing notes reviewed to obtain more history and assist with MDM  Differential diagnosis: DVT vs baker's cyst vs gastroc strain. No history of CHF, kidney or liver disease. Considered CHF, AKI less likely. No evidence of cellulitis, abscess, ischemic leg. Normal distal pulse and neuro. Doubt compartment syndrome.   ER lab work and imaging ordered by triage RN and me, as above  I have personally visualized and interpreted ER diagnostic work up including labs and imaging.    Labs reveal - normal WBC, creatinine.   Imaging reveals - bilateral vascular US shows left popliteal cyst, no DVT   Medications ordered - percocet   1500: Re-evaluated the patient.   Discussed US findings. Suspect baker's cyst given distal edema possibly ruptured. No evidence of infection, vascular or nerve compromise distally. Will give percocet. Ace dressing, knee sleeve for compression, crutches. Discharge with high dose NSAIDs, elevation and ortho follow up. Return precautions discussed.  Final Clinical Impression(s) / ED Diagnoses Final diagnoses:  Popliteal cyst, left    Rx / DC Orders ED Discharge Orders         Ordered    naproxen (NAPROSYN) 500 MG tablet  3 times daily with meals,   Status:  Discontinued        03/19/20 1509    naproxen (NAPROSYN) 500 MG tablet  3 times daily with meals        03/19/20 1509           Liberty Handy, PA-C 03/19/20 1510    Derwood Kaplan, MD 03/19/20 1655

## 2020-03-19 NOTE — Progress Notes (Signed)
Lower extremity venous has been completed.   Preliminary results in CV Proc.   Blanch Media 03/19/2020 1:20 PM

## 2020-03-19 NOTE — Progress Notes (Signed)
Orthopedic Tech Progress Note Patient Details:  David Gibson. [birth year] [id number]  Ortho Devices Type of Ortho Device: Ace wrap,Crutches,Knee Sleeve Ortho Device/Splint Location: LLE Ortho Device/Splint Interventions: Ordered,Application,Adjustment   Post Interventions Patient Tolerated: Well Instructions Provided: Care of device   Donald Pore 03/19/2020, 3:59 PM

## 2020-03-19 NOTE — Discharge Instructions (Signed)
You were seen in the ED for left leg pain and swelling  Ultrasound showed a cyst in the back of your knee.  The cyst is filled with fluid.  Given the swelling in the lower part of your leg and foot it is possible that the cyst is leaking or has ruptured.  This fluid will cause inflammation and pain and swelling.  Take 500 mg of naproxen morning, afternoon and night with meals for pain.  You can also take it as needed.  If you have persistent or more severe pain, you can add at 650 mg to 1000 mg of acetaminophen (Tylenol) every 6-8 hours in addition to the naproxen.  We have given you a knee sleeve and an Ace bandage.  Wear these to help provide compression and help with swelling.  Elevate your leg as much as possible to help with swelling.  You may use your crutches for the next 48 hours to rest your leg.  Further care is done by orthopedic providers.  Please see contact information below, call them today and make an appointment in the next 7 to 14 days for reevaluation in the clinic  Return to the ED for signs of infection like redness, warmth, fevers or decreased sensation or discoloration of your foot

## 2020-03-19 NOTE — ED Notes (Signed)
Patient transported to Ultrasound 

## 2020-03-19 NOTE — ED Triage Notes (Signed)
Pt reports L leg pain x 1 month.  States he got hit at work with a rolling set of steps to L lower leg 1 month ago and he continues to have pain and difficulty ambulating.

## 2020-08-26 ENCOUNTER — Encounter (HOSPITAL_BASED_OUTPATIENT_CLINIC_OR_DEPARTMENT_OTHER): Payer: Self-pay | Admitting: Urology

## 2020-08-26 ENCOUNTER — Other Ambulatory Visit: Payer: Self-pay

## 2020-08-26 ENCOUNTER — Emergency Department (HOSPITAL_BASED_OUTPATIENT_CLINIC_OR_DEPARTMENT_OTHER)
Admission: EM | Admit: 2020-08-26 | Discharge: 2020-08-26 | Disposition: A | Discharge status: Home / Self Care | Payer: Medicaid Other | Attending: Emergency Medicine | Admitting: Emergency Medicine

## 2020-08-26 ENCOUNTER — Emergency Department (HOSPITAL_BASED_OUTPATIENT_CLINIC_OR_DEPARTMENT_OTHER): Payer: Medicaid Other

## 2020-08-26 DIAGNOSIS — R1030 Lower abdominal pain, unspecified: Secondary | ICD-10-CM | POA: Diagnosis not present

## 2020-08-26 DIAGNOSIS — F17290 Nicotine dependence, other tobacco product, uncomplicated: Secondary | ICD-10-CM | POA: Insufficient documentation

## 2020-08-26 DIAGNOSIS — N50812 Left testicular pain: Secondary | ICD-10-CM | POA: Diagnosis present

## 2020-08-26 LAB — URINALYSIS, ROUTINE W REFLEX MICROSCOPIC
Bilirubin Urine: NEGATIVE
Glucose, UA: NEGATIVE mg/dL
Hgb urine dipstick: NEGATIVE
Ketones, ur: NEGATIVE mg/dL
Leukocytes,Ua: NEGATIVE
Nitrite: NEGATIVE
Protein, ur: NEGATIVE mg/dL
Specific Gravity, Urine: >1.03 — ABNORMAL HIGH (ref 1.005–1.030)
pH: 6 (ref 5.0–8.0)

## 2020-08-26 MED ORDER — IBUPROFEN 800 MG PO TABS: 800.0000 mg | ORAL_TABLET | Freq: Three times a day (TID) | ORAL | 0 refills | Status: AC | PRN

## 2020-08-26 MED ORDER — KETOROLAC TROMETHAMINE 60 MG/2ML IM SOLN: 60.0000 mg | Freq: Once | INTRAMUSCULAR | Status: DC

## 2020-08-26 NOTE — ED Triage Notes (Signed)
States left sided groin pain and swelling x 1 year, states feels bloated as well, denies N/V.

## 2020-08-26 NOTE — Discharge Instructions (Addendum)
You presented to the ED with some right sided heaviness and intermittent right flank pain. You stated it was chronic in nature but worsening. No hernia was seen on examination. An ultrasound of the testicle was done that showed you had a hydrocele. I would recommend a urology follow up to see the definite management of the hydrocele as that could explain your heaviness feel in the testicles. Your blood pressure was elevated today but that could be due to pain. I would recommend establishing with a primary care doctor and seeing if you have underlying blood pressure. If you get chest pain or shortness of breath, please come to the ED.

## 2020-08-26 NOTE — ED Notes (Signed)
Unable to obtain VS. Pt is in [place].

## 2020-08-27 NOTE — ED Provider Notes (Addendum)
[facility] EMERGENCY DEPARTMENT Provider Note   CSN: [id number] Arrival date & time: 08/26/20  1522     History No chief complaint on file.   David Gibson. is a 55 y.o. male with past medical history as stated below presented to the ED with heaviness feeling in the groin area. Patient stated this is a chronic problem for him and that he has been having similar symptoms since last year. He lifted something heavy last year that after which the symptoms developed. He remembered getting an unusual feeling after that incident that persisted. Recently it has become a constant discomfort. He feels pain in the left flank and groin area. He feels as his testicles are hanging down and he has to lift them up. He denied having a PCP. He endorsed abdominal fullness but denied any pain. He also denied nausea, vomiting, diarrhea or constipation.   The history is provided by the patient.     History reviewed. No pertinent past medical history.  Patient Active Problem List   Diagnosis Date Noted   Multiple closed fractures of ribs of right side 07/06/2019   Pneumothorax, traumatic 07/06/2019    History reviewed. No pertinent surgical history.     Family History  Problem Relation Age of Onset   Diabetes Mother     Social History   Tobacco Use   Smoking status: Every Day    Types: Cigars   Smokeless tobacco: Never  Substance Use Topics   Alcohol use: Yes    Alcohol/week: 6.0 standard drinks    Types: 6 Cans of beer per week   Drug use: Yes    Types: Marijuana    Home Medications Prior to Admission medications   Medication Sig Start Date End Date Taking? Authorizing Provider  ibuprofen (ADVIL) 800 MG tablet Take 1 tablet (800 mg total) by mouth every 8 (eight) hours as needed for moderate pain. 08/26/20 09/25/20 Yes Gwenevere Abbot, MD  lidocaine (LIDODERM) 5 % Place 1 patch onto the skin every 12 (twelve) hours. Remove & Discard patch within 12 hours or as  directed by MD 07/06/19   Myra Rude, MD  oxyCODONE-acetaminophen (PERCOCET) 5-325 MG tablet Take 1-2 tablets by mouth every 4 (four) hours as needed for severe pain. 07/20/19   Myra Rude, MD    Allergies    Patient has no known allergies.  Review of Systems   Review of Systems  Constitutional:  Negative for chills and fever.  HENT:  Negative for ear pain and sore throat.   Eyes:  Negative for pain and visual disturbance.  Respiratory:  Negative for cough and shortness of breath.   Cardiovascular:  Negative for chest pain and palpitations.  Gastrointestinal:  Negative for abdominal pain and vomiting.  Genitourinary:  Positive for flank pain (intermittent left side flank pain) and scrotal swelling. Negative for dysuria, hematuria and penile discharge.  Musculoskeletal:  Positive for gait problem. Negative for arthralgias and back pain.  Skin:  Negative for color change and rash.  Neurological:  Negative for seizures and syncope.  Psychiatric/Behavioral:  Negative for agitation and behavioral problems.   All other systems reviewed and are negative.  Physical Exam Updated Vital Signs BP (!) 148/113 (BP Location: Right Arm)   Pulse (!) 55   Temp 98.3 F (36.8 C) (Oral)   Resp 18   Ht 5\' 8"  (1.727 m)   Wt 97.1 kg   SpO2 100%   BMI 32.54 kg/m   Physical Exam  Vitals and nursing note reviewed.  Constitutional:      Appearance: Normal appearance. He is well-developed.  HENT:     Head: Normocephalic and atraumatic.     Right Ear: External ear normal.     Left Ear: External ear normal.     Mouth/Throat:     Mouth: Mucous membranes are moist.     Pharynx: Oropharynx is clear. No posterior oropharyngeal erythema.  Eyes:     Extraocular Movements: Extraocular movements intact.     Conjunctiva/sclera: Conjunctivae normal.     Pupils: Pupils are equal, round, and reactive to light.  Cardiovascular:     Rate and Rhythm: Normal rate and regular rhythm.     Heart sounds:  No murmur heard. Pulmonary:     Effort: Pulmonary effort is normal. No respiratory distress.     Breath sounds: Normal breath sounds.  Abdominal:     General: Bowel sounds are normal.     Palpations: Abdomen is soft.     Tenderness: There is no abdominal tenderness.     Hernia: There is no hernia in the left inguinal area or right inguinal area.  Genitourinary:    Penis: Normal.      Testes: Cremasteric reflex is present.     Comments: Testicles appear swollen.  Musculoskeletal:        General: No swelling or tenderness. Normal range of motion.     Cervical back: Normal range of motion and neck supple.  Skin:    General: Skin is warm and dry.  Neurological:     General: No focal deficit present.     Mental Status: He is alert and oriented to person, place, and time.  Psychiatric:        Mood and Affect: Mood normal.        Behavior: Behavior normal.    ED Results / Procedures / Treatments   Labs (all labs ordered are listed, but only abnormal results are displayed) Labs Reviewed  URINALYSIS, ROUTINE W REFLEX MICROSCOPIC - Abnormal; Notable for the following components:      Result Value   Specific Gravity, Urine >1.030 (*)    All other components within normal limits    EKG None  Radiology US SCROTUM W/DOPPLER  Result Date: 08/26/2020 CLINICAL DATA:  55 year old male with left testicular pain. EXAM: SCROTAL ULTRASOUND DOPPLER ULTRASOUND OF THE TESTICLES TECHNIQUE: Complete ultrasound examination of the testicles, epididymis, and other scrotal structures was performed. Color and spectral Doppler ultrasound were also utilized to evaluate blood flow to the testicles. COMPARISON:  None. FINDINGS: Right testicle Measurements: 4.3 x 2.0 x 3.6 cm. No mass or microlithiasis visualized. Left testicle Measurements: 3.6 x 1.9 x 3.1 cm. No mass or microlithiasis visualized. Right epididymis:  Normal in size and appearance. Left epididymis:  Normal in size and appearance. Hydrocele:   Small bilateral hydroceles. Varicocele:  None visualized. Pulsed Doppler interrogation of both testes demonstrates normal low resistance arterial and venous waveforms bilaterally. IMPRESSION: Small bilateral hydroceles, otherwise unremarkable testicular ultrasound. Electronically Signed   By: Elgie Collard M.D.   On: 08/26/2020 20:39    Procedures Procedures   Medications Ordered in ED Medications - No data to display  ED Course  I have reviewed the triage vital signs and the nursing notes.  Pertinent labs & imaging results that were available during my care of the patient were reviewed by me and considered in my medical decision making (see chart for details).    MDM Rules/Calculators/A&P  Testicular Discomfort Patient presented to the ED with left sided testicular discomfort that is chronic in nature but recently worsened. It was intermittent but now became constant. Ddx include inguinal hernia vs varicocele vs hydrocele vs testicular mass/tumor vs testicular torsion. Most concerning was testicular tumor and testicular torsion so ultrasound of both testicles was done which showed no mass or torsion but a hydrocele.  Varicocele was suspected from physical exam but not seen on the ultrasound making it unlikely. Hydrocele explains his symptoms of heaviness in his testicles and the feeling that he gets about them hanging too low. Advised patient a urology follow up is needed for definitive management of this. The pulling sensation may be causing strain on his groin.   Plan: -Ultrasound of testicles -Ibuprofen 800mg  TID prn -Follow up PCP and urology referral for definitive management.  -PCP to consider CT of AP if symptoms persist Final Clinical Impression(s) / ED Diagnoses Final diagnoses:  Pain in left testicle    Rx / DC Orders ED Discharge Orders          Ordered    ibuprofen (ADVIL) 800 MG tablet  Every 8 hours PRN        08/26/20 2142              2143, MD 08/27/20 1649    08/29/20, MD 08/27/20 1651    08/29/20, MD 08/28/20 1419

## 2020-10-12 ENCOUNTER — Inpatient Hospital Stay (HOSPITAL_COMMUNITY)
Admission: EM | Admit: 2020-10-12 | Discharge: 2020-10-22 | DRG: 196 | Disposition: A | Discharge status: Home / Self Care | Payer: 59 | Attending: Internal Medicine | Admitting: Internal Medicine

## 2020-10-12 ENCOUNTER — Emergency Department (HOSPITAL_COMMUNITY): Payer: 59

## 2020-10-12 ENCOUNTER — Other Ambulatory Visit: Payer: Self-pay

## 2020-10-12 ENCOUNTER — Encounter (HOSPITAL_COMMUNITY): Payer: Self-pay | Admitting: Emergency Medicine

## 2020-10-12 DIAGNOSIS — J90 Pleural effusion, not elsewhere classified: Secondary | ICD-10-CM | POA: Diagnosis present

## 2020-10-12 DIAGNOSIS — Z79899 Other long term (current) drug therapy: Secondary | ICD-10-CM | POA: Diagnosis not present

## 2020-10-12 DIAGNOSIS — R590 Localized enlarged lymph nodes: Secondary | ICD-10-CM

## 2020-10-12 DIAGNOSIS — K219 Gastro-esophageal reflux disease without esophagitis: Secondary | ICD-10-CM | POA: Diagnosis present

## 2020-10-12 DIAGNOSIS — D869 Sarcoidosis, unspecified: Secondary | ICD-10-CM | POA: Diagnosis present

## 2020-10-12 DIAGNOSIS — D8685 Sarcoid myocarditis: Secondary | ICD-10-CM | POA: Diagnosis not present

## 2020-10-12 DIAGNOSIS — Z20822 Contact with and (suspected) exposure to covid-19: Secondary | ICD-10-CM | POA: Diagnosis present

## 2020-10-12 DIAGNOSIS — R079 Chest pain, unspecified: Secondary | ICD-10-CM | POA: Diagnosis present

## 2020-10-12 DIAGNOSIS — Z23 Encounter for immunization: Secondary | ICD-10-CM | POA: Diagnosis not present

## 2020-10-12 DIAGNOSIS — R42 Dizziness and giddiness: Secondary | ICD-10-CM | POA: Diagnosis present

## 2020-10-12 DIAGNOSIS — E875 Hyperkalemia: Secondary | ICD-10-CM | POA: Diagnosis not present

## 2020-10-12 DIAGNOSIS — I340 Nonrheumatic mitral (valve) insufficiency: Secondary | ICD-10-CM | POA: Diagnosis not present

## 2020-10-12 DIAGNOSIS — I493 Ventricular premature depolarization: Secondary | ICD-10-CM | POA: Diagnosis not present

## 2020-10-12 DIAGNOSIS — H30033 Focal chorioretinal inflammation, peripheral, bilateral: Secondary | ICD-10-CM | POA: Diagnosis present

## 2020-10-12 DIAGNOSIS — Z91120 Patient's intentional underdosing of medication regimen due to financial hardship: Secondary | ICD-10-CM

## 2020-10-12 DIAGNOSIS — I5021 Acute systolic (congestive) heart failure: Secondary | ICD-10-CM | POA: Diagnosis not present

## 2020-10-12 DIAGNOSIS — E669 Obesity, unspecified: Secondary | ICD-10-CM | POA: Diagnosis present

## 2020-10-12 DIAGNOSIS — I472 Ventricular tachycardia: Secondary | ICD-10-CM | POA: Diagnosis not present

## 2020-10-12 DIAGNOSIS — I5041 Acute combined systolic (congestive) and diastolic (congestive) heart failure: Secondary | ICD-10-CM | POA: Diagnosis not present

## 2020-10-12 DIAGNOSIS — Z6832 Body mass index (BMI) 32.0-32.9, adult: Secondary | ICD-10-CM | POA: Diagnosis not present

## 2020-10-12 DIAGNOSIS — J189 Pneumonia, unspecified organism: Secondary | ICD-10-CM | POA: Diagnosis not present

## 2020-10-12 DIAGNOSIS — I272 Pulmonary hypertension, unspecified: Secondary | ICD-10-CM | POA: Diagnosis not present

## 2020-10-12 DIAGNOSIS — I959 Hypotension, unspecified: Secondary | ICD-10-CM | POA: Diagnosis not present

## 2020-10-12 DIAGNOSIS — R0600 Dyspnea, unspecified: Secondary | ICD-10-CM | POA: Diagnosis present

## 2020-10-12 DIAGNOSIS — N1831 Chronic kidney disease, stage 3a: Secondary | ICD-10-CM | POA: Diagnosis present

## 2020-10-12 DIAGNOSIS — R0609 Other forms of dyspnea: Secondary | ICD-10-CM | POA: Diagnosis not present

## 2020-10-12 DIAGNOSIS — N170 Acute kidney failure with tubular necrosis: Secondary | ICD-10-CM | POA: Diagnosis present

## 2020-10-12 DIAGNOSIS — Z597 Insufficient social insurance and welfare support: Secondary | ICD-10-CM | POA: Diagnosis not present

## 2020-10-12 DIAGNOSIS — Z833 Family history of diabetes mellitus: Secondary | ICD-10-CM

## 2020-10-12 DIAGNOSIS — R0602 Shortness of breath: Secondary | ICD-10-CM | POA: Diagnosis present

## 2020-10-12 DIAGNOSIS — I420 Dilated cardiomyopathy: Secondary | ICD-10-CM | POA: Diagnosis not present

## 2020-10-12 DIAGNOSIS — I083 Combined rheumatic disorders of mitral, aortic and tricuspid valves: Secondary | ICD-10-CM | POA: Diagnosis present

## 2020-10-12 DIAGNOSIS — N400 Enlarged prostate without lower urinary tract symptoms: Secondary | ICD-10-CM | POA: Diagnosis present

## 2020-10-12 DIAGNOSIS — N179 Acute kidney failure, unspecified: Secondary | ICD-10-CM

## 2020-10-12 DIAGNOSIS — K5900 Constipation, unspecified: Secondary | ICD-10-CM | POA: Diagnosis not present

## 2020-10-12 DIAGNOSIS — R0603 Acute respiratory distress: Secondary | ICD-10-CM | POA: Diagnosis not present

## 2020-10-12 DIAGNOSIS — D8689 Sarcoidosis of other sites: Secondary | ICD-10-CM | POA: Diagnosis present

## 2020-10-12 DIAGNOSIS — D649 Anemia, unspecified: Secondary | ICD-10-CM | POA: Diagnosis not present

## 2020-10-12 DIAGNOSIS — I248 Other forms of acute ischemic heart disease: Secondary | ICD-10-CM | POA: Diagnosis not present

## 2020-10-12 DIAGNOSIS — I4729 Other ventricular tachycardia: Secondary | ICD-10-CM

## 2020-10-12 DIAGNOSIS — Z87891 Personal history of nicotine dependence: Secondary | ICD-10-CM

## 2020-10-12 DIAGNOSIS — H35063 Retinal vasculitis, bilateral: Secondary | ICD-10-CM | POA: Diagnosis present

## 2020-10-12 HISTORY — DX: Benign prostatic hyperplasia without lower urinary tract symptoms: N40.0

## 2020-10-12 HISTORY — DX: Unspecified disorder of eye and adnexa: H57.9

## 2020-10-12 HISTORY — DX: Male erectile dysfunction, unspecified: N52.9

## 2020-10-12 HISTORY — DX: Focal chorioretinal inflammation, peripheral, bilateral: H30.033

## 2020-10-12 HISTORY — DX: Allergy, unspecified, initial encounter: T78.40XA

## 2020-10-12 HISTORY — DX: Gastro-esophageal reflux disease without esophagitis: K21.9

## 2020-10-12 HISTORY — DX: Retinal vasculitis, bilateral: H35.063

## 2020-10-12 LAB — RESP PANEL BY RT-PCR (FLU A&B, COVID) ARPGX2
Influenza A by PCR: NEGATIVE
Influenza B by PCR: NEGATIVE
SARS Coronavirus 2 by RT PCR: NEGATIVE

## 2020-10-12 LAB — LIPASE, BLOOD: Lipase: 28 U/L (ref 11–51)

## 2020-10-12 LAB — BRAIN NATRIURETIC PEPTIDE: B Natriuretic Peptide: 558.3 pg/mL — ABNORMAL HIGH (ref 0.0–100.0)

## 2020-10-12 LAB — CBC
HCT: 38.8 % — ABNORMAL LOW (ref 39.0–52.0)
Hemoglobin: 12.2 g/dL — ABNORMAL LOW (ref 13.0–17.0)
MCH: 27.4 pg (ref 26.0–34.0)
MCHC: 31.4 g/dL (ref 30.0–36.0)
MCV: 87 fL (ref 80.0–100.0)
Platelets: 343 10*3/uL (ref 150–400)
RBC: 4.46 MIL/uL (ref 4.22–5.81)
RDW: 15.2 % (ref 11.5–15.5)
WBC: 4.7 10*3/uL (ref 4.0–10.5)
nRBC: 0 % (ref 0.0–0.2)

## 2020-10-12 LAB — BASIC METABOLIC PANEL
Anion gap: 8 (ref 5–15)
BUN: 12 mg/dL (ref 6–20)
CO2: 22 mmol/L (ref 22–32)
Calcium: 9 mg/dL (ref 8.9–10.3)
Chloride: 109 mmol/L (ref 98–111)
Creatinine, Ser: 1.51 mg/dL — ABNORMAL HIGH (ref 0.61–1.24)
GFR, Estimated: 55 mL/min — ABNORMAL LOW (ref >60)
Glucose, Bld: 96 mg/dL (ref 70–99)
Potassium: 3.8 mmol/L (ref 3.5–5.1)
Sodium: 139 mmol/L (ref 135–145)

## 2020-10-12 LAB — HEPATIC FUNCTION PANEL
ALT: 19 U/L (ref 0–44)
AST: 30 U/L (ref 15–41)
Albumin: 3.3 g/dL — ABNORMAL LOW (ref 3.5–5.0)
Alkaline Phosphatase: 73 U/L (ref 38–126)
Bilirubin, Direct: 0.2 mg/dL (ref 0.0–0.2)
Indirect Bilirubin: 0.9 mg/dL (ref 0.3–0.9)
Total Bilirubin: 1.1 mg/dL (ref 0.3–1.2)
Total Protein: 7.3 g/dL (ref 6.5–8.1)

## 2020-10-12 LAB — TROPONIN I (HIGH SENSITIVITY)
Troponin I (High Sensitivity): 156 ng/L (ref <18)
Troponin I (High Sensitivity): 164 ng/L (ref <18)

## 2020-10-12 LAB — D-DIMER, QUANTITATIVE: D-Dimer, Quant: 1.68 ug/mL-FEU — ABNORMAL HIGH (ref 0.00–0.50)

## 2020-10-12 MED ORDER — SODIUM CHLORIDE 0.9 % IV SOLN
1.0000 g | Freq: Once | INTRAVENOUS | Status: AC
  Administered 2020-10-12: 1 g via INTRAVENOUS
  Filled 2020-10-12: qty 10

## 2020-10-12 MED ORDER — MORPHINE SULFATE (PF) 4 MG/ML IV SOLN
4.0000 mg | Freq: Once | INTRAVENOUS | Status: AC
  Administered 2020-10-12: 4 mg via INTRAVENOUS
  Filled 2020-10-12: qty 1

## 2020-10-12 MED ORDER — SODIUM CHLORIDE 0.9 % IV SOLN
2.0000 g | INTRAVENOUS | Status: AC
  Administered 2020-10-13 – 2020-10-17 (×4): 2 g via INTRAVENOUS
  Filled 2020-10-12 (×5): qty 20

## 2020-10-12 MED ORDER — PANTOPRAZOLE SODIUM 40 MG PO TBEC
40.0000 mg | DELAYED_RELEASE_TABLET | Freq: Every day | ORAL | Status: DC
  Administered 2020-10-13 – 2020-10-22 (×10): 40 mg via ORAL
  Filled 2020-10-12 (×11): qty 1

## 2020-10-12 MED ORDER — SODIUM CHLORIDE 0.9% FLUSH: 3.0000 mL | INTRAVENOUS | Status: DC | PRN

## 2020-10-12 MED ORDER — ACETAMINOPHEN 650 MG RE SUPP: 650.0000 mg | Freq: Four times a day (QID) | RECTAL | Status: DC | PRN

## 2020-10-12 MED ORDER — ALBUTEROL SULFATE (2.5 MG/3ML) 0.083% IN NEBU
2.5000 mg | INHALATION_SOLUTION | Freq: Four times a day (QID) | RESPIRATORY_TRACT | Status: DC
  Administered 2020-10-12 – 2020-10-13 (×3): 2.5 mg via RESPIRATORY_TRACT
  Filled 2020-10-12 (×3): qty 3

## 2020-10-12 MED ORDER — METHOCARBAMOL 500 MG PO TABS
500.0000 mg | ORAL_TABLET | Freq: Three times a day (TID) | ORAL | Status: DC | PRN
  Administered 2020-10-14 – 2020-10-15 (×2): 500 mg via ORAL
  Filled 2020-10-12 (×2): qty 1

## 2020-10-12 MED ORDER — HYDROCODONE-ACETAMINOPHEN 5-325 MG PO TABS: 1.0000 | ORAL_TABLET | ORAL | Status: DC | PRN

## 2020-10-12 MED ORDER — LORATADINE 10 MG PO TABS
10.0000 mg | ORAL_TABLET | Freq: Every day | ORAL | Status: DC
  Administered 2020-10-13 – 2020-10-22 (×10): 10 mg via ORAL
  Filled 2020-10-12 (×11): qty 1

## 2020-10-12 MED ORDER — TAMSULOSIN HCL 0.4 MG PO CAPS
0.8000 mg | ORAL_CAPSULE | ORAL | Status: DC
  Administered 2020-10-17 – 2020-10-21 (×3): 0.8 mg via ORAL
  Filled 2020-10-12 (×7): qty 2

## 2020-10-12 MED ORDER — TAMSULOSIN HCL 0.4 MG PO CAPS
0.8000 mg | ORAL_CAPSULE | Freq: Every day | ORAL | Status: DC
  Administered 2020-10-12 – 2020-10-16 (×5): 0.8 mg via ORAL
  Filled 2020-10-12 (×4): qty 2

## 2020-10-12 MED ORDER — TAMSULOSIN HCL 0.4 MG PO CAPS: 0.8000 mg | ORAL_CAPSULE | ORAL | Status: DC

## 2020-10-12 MED ORDER — MONTELUKAST SODIUM 10 MG PO TABS
10.0000 mg | ORAL_TABLET | Freq: Every day | ORAL | Status: DC
  Administered 2020-10-13 – 2020-10-22 (×9): 10 mg via ORAL
  Filled 2020-10-12 (×11): qty 1

## 2020-10-12 MED ORDER — SODIUM CHLORIDE 0.9 % IV SOLN
500.0000 mg | Freq: Once | INTRAVENOUS | Status: DC
  Filled 2020-10-12: qty 500

## 2020-10-12 MED ORDER — SODIUM CHLORIDE 0.9% FLUSH
3.0000 mL | Freq: Two times a day (BID) | INTRAVENOUS | Status: DC
  Administered 2020-10-13 – 2020-10-22 (×8): 3 mL via INTRAVENOUS

## 2020-10-12 MED ORDER — ENOXAPARIN SODIUM 40 MG/0.4ML IJ SOSY
40.0000 mg | PREFILLED_SYRINGE | INTRAMUSCULAR | Status: DC
  Administered 2020-10-12 – 2020-10-22 (×9): 40 mg via SUBCUTANEOUS
  Filled 2020-10-12 (×10): qty 0.4

## 2020-10-12 MED ORDER — ONDANSETRON HCL 4 MG/2ML IJ SOLN
4.0000 mg | Freq: Once | INTRAMUSCULAR | Status: AC
  Administered 2020-10-12: 4 mg via INTRAVENOUS
  Filled 2020-10-12: qty 2

## 2020-10-12 MED ORDER — POLYETHYLENE GLYCOL 3350 17 G PO PACK: 17.0000 g | PACK | Freq: Every day | ORAL | Status: DC | PRN

## 2020-10-12 MED ORDER — IOHEXOL 350 MG/ML SOLN
75.0000 mL | Freq: Once | INTRAVENOUS | Status: AC | PRN
  Administered 2020-10-12: 75 mL via INTRAVENOUS

## 2020-10-12 MED ORDER — ONDANSETRON HCL 4 MG/2ML IJ SOLN: 4.0000 mg | Freq: Four times a day (QID) | INTRAMUSCULAR | Status: DC | PRN

## 2020-10-12 MED ORDER — ONDANSETRON HCL 4 MG PO TABS: 4.0000 mg | ORAL_TABLET | Freq: Four times a day (QID) | ORAL | Status: DC | PRN

## 2020-10-12 MED ORDER — ACETAMINOPHEN 325 MG PO TABS
650.0000 mg | ORAL_TABLET | Freq: Four times a day (QID) | ORAL | Status: DC | PRN
  Administered 2020-10-13 – 2020-10-20 (×3): 650 mg via ORAL
  Filled 2020-10-12 (×3): qty 2

## 2020-10-12 MED ORDER — DOCUSATE SODIUM 100 MG PO CAPS
100.0000 mg | ORAL_CAPSULE | Freq: Two times a day (BID) | ORAL | Status: DC
  Administered 2020-10-13 – 2020-10-22 (×18): 100 mg via ORAL
  Filled 2020-10-12 (×19): qty 1

## 2020-10-12 MED ORDER — SODIUM CHLORIDE 0.9 % IV SOLN
500.0000 mg | INTRAVENOUS | Status: DC
  Administered 2020-10-12 – 2020-10-13 (×2): 500 mg via INTRAVENOUS
  Filled 2020-10-12 (×2): qty 500

## 2020-10-12 MED ORDER — SODIUM CHLORIDE 0.9 % IV SOLN
1.0000 g | Freq: Once | INTRAVENOUS | Status: DC
  Filled 2020-10-12: qty 10

## 2020-10-12 MED ORDER — SODIUM CHLORIDE 0.9 % IV SOLN: 250.0000 mL | INTRAVENOUS | Status: DC | PRN

## 2020-10-12 MED ORDER — SODIUM CHLORIDE 0.9 % IV SOLN: 1.0000 g | INTRAVENOUS | Status: DC

## 2020-10-12 MED ORDER — ASPIRIN EC 81 MG PO TBEC
81.0000 mg | DELAYED_RELEASE_TABLET | Freq: Every day | ORAL | Status: DC
  Administered 2020-10-13 – 2020-10-22 (×10): 81 mg via ORAL
  Filled 2020-10-12 (×11): qty 1

## 2020-10-12 MED ORDER — FUROSEMIDE 10 MG/ML IJ SOLN
20.0000 mg | Freq: Once | INTRAMUSCULAR | Status: AC
  Administered 2020-10-12: 20 mg via INTRAVENOUS
  Filled 2020-10-12: qty 2

## 2020-10-12 NOTE — H&P (Addendum)
[facility] History and Physical  [name]:[id number] DOB: 06-16-1965 DOA: 10/12/2020  Referring physician: ED  PCP: Dois Davenport, MD   Patient is coming from: Home  Chief Complaint: Shortness of breath-3 to 4 weeks  HPI: Nathan [name] is a 55 y.o. male with no significant past medical history presented to hospital with 3 to 4 weeks of shortness of breath and chest discomfort for 3 to 4 weeks.  Patient stated that all started with some chest discomfort around epigastric lower chest region 3 to 4 weeks back.  Subsequently, he started having increasing shortness of breath especially dyspnea on exertion.  Patient also had some cough with some productive sputum.  Patient denied any hemoptysis.  Patient denies any fever, chills or rigor.  Patient stated that his pain is constant over the last 3 to 4 weeks.  He has been taking Motrin with some relief.  Patient denies any dizziness, loss of consciousness.  Patient denies any nausea vomiting or abdominal pain.  Patient was seen by PCP and diagnosed with GERD and was taking medication for that.  Denies any changes in his bowel habits.  Denies any urinary urgency, frequency or dysuria.  Denies any sick contacts or recent travel.  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  This was 3 years back.  His symptoms improved so he did not follow-up with his ophthalmologist.    ED Course: Patient initially presented to the urgent care unit.  Chest x-ray was done which showed some hilar lymphadenopathy.  Was initially noted to be hypoxic with pulse ox of 87% on room air. patient was also noted to be tachycardic.  Patient was then transferred to the ED for further evaluation.  In the ED, patient was noted to have elevated BNP of 558.  D- dimer was elevated at 1.6, so CT angiogram of the chest was performed which showed a hilar and midsternal lymphadenopathy,  pleural effusion.  COVID test and influenza was negative.  Creatinine was elevated at 1.5.  Chest x-ray showed patchy interstitial and hazy airspace opacities suggestive of multifocal pneumonia.  Patient also received Rocephin and Zithromax and 20 mg of Lasix in the ED.  Review of Systems:  All systems were reviewed and were negative unless otherwise mentioned in the HPI  History reviewed. No pertinent past medical history. History reviewed. No pertinent surgical history.  Social History:  reports that he has been smoking cigarettes. He has never used smokeless tobacco. He reports current alcohol use. He reports that he does not currently use drugs.  No Known Allergies  History reviewed. No pertinent family history.   Prior to Admission medications   Medication Sig Start Date End Date Taking? Authorizing Provider  cetirizine (ZYRTEC) 10 MG tablet Take 10 mg by mouth daily.   Yes [provider]  ibuprofen (ADVIL,MOTRIN) 200 MG tablet Take 800 mg by mouth every 6 (six) hours as needed for moderate pain.   Yes [provider]  montelukast (SINGULAIR) 10 MG tablet Take 10 mg by mouth daily. 08/30/20  Yes [provider]  omeprazole (PRILOSEC) 40 MG capsule Take 40 mg by mouth every morning. 08/30/20  Yes [provider]  sildenafil (REVATIO) 20 MG tablet Take 60 mg by mouth as needed. 04/19/20  Yes [provider]  tamsulosin (FLOMAX) 0.4 MG CAPS capsule Take 0.8 mg by mouth every other day. 10/04/20  Yes  [provider]  methocarbamol (ROBAXIN) 500 MG tablet Take 1 tablet (500 mg total) by mouth 2 (two) times daily as needed for muscle spasms. Patient not taking: No sig reported 12/05/15   Ward, Chase Picket, PA-C  naproxen (NAPROSYN) 500 MG tablet TAKE 1 TABLET (500 MG TOTAL) BY MOUTH 3 (THREE) TIMES DAILY WITH MEALS FOR 15 DAYS. Patient not taking: No sig reported 03/19/20 03/19/21  Liberty Handy, [place]    Physical Exam: Vitals:    10/12/20 1359 10/12/20 1445 10/12/20 1559 10/12/20 1600  BP: (!) 124/96 131/89 126/83 126/83  Pulse: (!) 117 (!) 113 (!) 114 (!) 115  Resp: (!) 31 (!) 32 (!) 22 18  Temp:      TempSrc:      SpO2: 91% 97% 98% 98%  Weight:      Height:       Wt Readings from Last 3 Encounters:  10/12/20 102.5 kg   Body mass index is 32.43 kg/m.  General: Obese built, not in obvious distress HENT: Normocephalic, pupils equally reacting to light and accommodation.  No scleral pallor or icterus noted. Oral mucosa is moist.  Chest:    Diminished breath sounds bilaterally. No crackles or wheezes.  CVS: S1 &S2 heard. No murmur.  Regular rate and rhythm. Abdomen: Soft, nontender, nondistended.  Bowel sounds are heard.  Liver is not palpable, no abdominal mass palpated Extremities: No cyanosis, clubbing or edema.  Peripheral pulses are palpable. Psych: Alert, awake and oriented, normal mood CNS:  No cranial nerve deficits.  Power equal in all extremities.   No cerebellar signs.   Skin: Warm and dry.  No rashes noted.  Labs on Admission:   CBC: Recent Labs  Lab 10/12/20 1224  WBC 4.7  HGB 12.2*  HCT 38.8*  MCV 87.0  PLT 343    Basic Metabolic Panel: Recent Labs  Lab 10/12/20 1224  NA 139  K 3.8  CL 109  CO2 22  GLUCOSE 96  BUN 12  CREATININE 1.51*  CALCIUM 9.0    Liver Function Tests: Recent Labs  Lab 10/12/20 1252  AST 30  ALT 19  ALKPHOS 73  BILITOT 1.1  PROT 7.3  ALBUMIN 3.3*   Recent Labs  Lab 10/12/20 1252  LIPASE 28   No results for input(s): AMMONIA in the last 168 hours.  Cardiac Enzymes: No results for input(s): CKTOTAL, CKMB, CKMBINDEX, TROPONINI in the last 168 hours.  BNP (last 3 results) Recent Labs    10/12/20 1253  BNP 558.3*    ProBNP (last 3 results) No results for input(s): PROBNP in the last 8760 hours.  CBG: No results for input(s): GLUCAP in the last 168 hours.  Lipase     Component Value Date/Time   LIPASE 28 10/12/2020 1252      Urinalysis No results found for: COLORURINE, APPEARANCEUR, LABSPEC, PHURINE, GLUCOSEU, HGBUR, BILIRUBINUR, KETONESUR, PROTEINUR, UROBILINOGEN, NITRITE, LEUKOCYTESUR   Drugs of Abuse  No results found for: LABOPIA, COCAINSCRNUR, LABBENZ, AMPHETMU, THCU, LABBARB    Radiological Exams on Admission: DG Chest 2 View  Result Date: 10/12/2020 CLINICAL DATA:  Chest pain and shortness of breath. EXAM: CHEST - 2 VIEW COMPARISON:  Chest x-ray dated July 20, 2019. FINDINGS: The heart size and mediastinal contours are within normal limits. Patchy interstitial and hazy airspace opacities in both lungs. No pleural effusion or pneumothorax. No acute osseous abnormality. IMPRESSION: 1. Bilateral patchy interstitial and hazy airspace opacities, concerning for multifocal pneumonia, less likely pulmonary edema. Electronically Signed  By: Obie Dredge M.D.   On: 10/12/2020 14:54   CT Angio Chest PE W and/or Wo Contrast  Result Date: 10/12/2020 CLINICAL DATA:  Suspected pulmonary embolus. EXAM: CT ANGIOGRAPHY CHEST WITH CONTRAST TECHNIQUE: Multidetector CT imaging of the chest was performed using the standard protocol during bolus administration of intravenous contrast. Multiplanar CT image reconstructions and MIPs were obtained to evaluate the vascular anatomy. CONTRAST:  31mL OMNIPAQUE IOHEXOL 350 MG/ML SOLN COMPARISON:  Jun 30, 2019 FINDINGS: Cardiovascular: Satisfactory opacification of the pulmonary arteries to the segmental level. No evidence of pulmonary embolism. Normal heart size. No pericardial effusion. Mediastinum/Nodes: Conglomerates of bulky abnormal lymph nodes are seen in right pretracheal, left prevascular, bilateral hilar and infra carinal regions. These measure up to 5 cm in diameter. Lungs/Pleura: Bilateral pleural effusions, small on the left and moderate on the right. Patchy ground-glass opacities with central predominance are seen in both lungs, disproportionately affecting the right upper  lobe. Upper Abdomen: No acute abnormality. Musculoskeletal: No chest wall abnormality. No acute or significant osseous findings. Review of the MIP images confirms the above findings. IMPRESSION: 1. No evidence of pulmonary embolism. 2. Multifocal bulky mediastinal and hilar lymphadenopathy. These may represent lymphoproliferative disorder, malignant or reactive lymphadenopathy. 3. Bilateral pleural effusions, small on the left and moderate on the right. 4. Patchy ground-glass opacities with central predominance in both lungs, disproportionately affecting the right upper lobe. This may represent infectious/inflammatory pneumonitis or asymmetric mixed pattern pulmonary edema. Electronically Signed   By: Ted Mcalpine M.D.   On: 10/12/2020 15:52    EKG: Personally reviewed by me which shows tachycardia  Assessment/Plan Principal Problem:   Shortness of breath Active Problems:   Mediastinal lymphadenopathy   Pneumonia   Chest pain   Dyspnea  Dyspnea, chest pain and shortness of breath.  Chest x-ray with multi focal pneumonia.  We will continue with Rocephin and Zithromax for now.  CT scan shows bilateral hilar and midsternal lymphadenopathy with pleural effusion.  Possibility of lymphoproliferative disorder/sarcoidosis.  We will also check HIV.  We will consult pulmonary for further evaluation.   Might need pleural tapping as well.  Continue with nebulizer, supportive care, incentive spirometry.  Might benefit from IV steroids.  We will defer to pulmonary.  Multifocal pneumonia, atypical pneumonia.  Continue Rocephin and Zithromax.  Influenza and COVID was negative.  Hilar, midsternal lymphadenopathy. As per the CT angiogram of the chest.  Likely secondary to sarcoidosis or lymphoproliferative disorder.  We will consult pulmonary.  Spoke with pulmonary on-call today.  Elevated creatinine.  Creatinine of 1.5 at this time ,we will continue to monitor closely.  Hold NSAIDs for now.  Received 1  dose of IV Lasix in the ED.  Will monitor closely.  Creatinine on 02/07/2019 was 1.3 as reviewed from care everywhere.  Elevated troponin.  But flat trend.  EKG with tachycardia.  Chest pain for almost 1 month constant.  Atypical chest pain.  Has elevated creatinine as well.  Could not rule out cardiac involvement by systemic disease,  Check 2D echocardiogram for assessing regional wall motion abnormality.  Rule out cardiac involvement/pericarditis.  Continue aspirin for now.  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  Was on steroids for 6 months in the past was seen by ophthalmologist in the past.  DVT Prophylaxis: Lovenox subcu  Consultant: None  Code Status: Full code  Microbiology none  Antibiotics:  Rocephin and Zithromax IV  Family Communication:  Patients' condition and plan of care including tests  being ordered have been discussed with the patient and the patient's wife at bedside who indicate understanding and agree with the plan.  Status is: Inpatient  Remains inpatient appropriate because:IV treatments appropriate due to intensity of illness or inability to take PO and Inpatient level of care appropriate due to severity of illness  Dispo: The patient is from: Home              Anticipated d/c is to: Home              Patient currently is not medically stable to d/c.   Difficult to place patient No   Severity of Illness: The appropriate patient status for this patient is INPATIENT. Inpatient status is judged to be reasonable and necessary in order to provide the required intensity of service to ensure the patient's safety. The patient's presenting symptoms, physical exam findings, and initial radiographic and laboratory data in the context of their chronic comorbidities is felt to place them at high risk for further clinical deterioration. Furthermore, it is not anticipated that the patient will be medically stable for discharge from the hospital within 2 midnights  of admission.  I certify that at the point of admission it is my clinical judgment that the patient will require inpatient hospital care spanning beyond 2 midnights from the point of admission due to high intensity of service, high risk for further deterioration and high frequency of surveillance required.  Signed, Joycelyn Das, MD [facility] 10/12/2020

## 2020-10-12 NOTE — Consult Note (Addendum)
NAME:  Nathan [name], MRN:  [id number], DOB:  March 10, 1965, LOS: 0 ADMISSION DATE:  10/12/2020, CONSULTATION DATE:  10/12/2020 REFERRING MD:  Nathan Das MD, CHIEF COMPLAINT:  Abnormal CT scan   History of Present Illness:  55 Y/O with remote smoking history, peripheral focal chorioretinal inflammation eyes  Admitted with dyspnea, chest discomfort.  CT scan on admission significant for small bilateral pleural effusion, mediastinal lymphadenopathy  Pertinent  Medical History    has a past medical history of Ocular disease.  He has history of peripheral focal chorioretinal inflammation of both eyes diagnosed in 2020.  Followed at [facility] ophthalmology.  He was treated with methotrexate and folic acid but self discontinued after 6 months in 2021.  He has not followed up with his ophthalmologist.  Remote smoker in his 20 to 30s  Has a [employer] company.  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  No pets  Significant Hospital Events: Including procedures, antibiotic start and stop dates in addition to other pertinent events   9/10 admit  Interim History / Subjective:    Objective   Blood pressure 126/83, pulse (!) 115, temperature 98.4 F (36.9 C), temperature source Oral, resp. rate 18, height 5\' 10"  (1.778 m), weight 102.5 kg, SpO2 98 %.       No intake or output data in the 24 hours ending 10/12/20 1717 Filed Weights   10/12/20 1246  Weight: 102.5 kg    Examination: Gen:      No acute distress HEENT:  EOMI, sclera anicteric Neck:     No masses; no thyromegaly Lungs:    Clear to auscultation bilaterally; normal respiratory effort CV:         Regular rate and rhythm; no murmurs Abd:      + bowel sounds; soft, non-tender; no palpable masses, no distension Ext:    No edema; adequate peripheral perfusion Skin:      Warm and dry; no rash Neuro: alert and oriented x 3 Psych: normal mood and affect   Lab/imaging personally reviewed CT significant for  bulky mediastinal, bilateral hilar lymphadenopathy, groundglass opacity bilaterally with bilateral pleural effusion.  Troponin 156, BNP 558 Hepatic panel within normal limits, creatinine 1.51  Resolved Hospital Problem list     Assessment & Plan:  Bilateral mediastinal and hilar lymphadenopathy Suspect either sarcoidosis or lymphoproliferative disorder.  Sarcoidosis is more likely given history of chorioretinal inflammation in the past.  He may have a community-acquired pneumonia but the size of the lymph nodes is out of proportion.  Agree with ceftriaxone, azithromycin Avoid steroids until diagnosis is made Supplemental oxygen Check ACE level Follow chest x-ray If stabilized we will plan for bronchoscope with endobronchial ultrasound biopsy of lymph nodes early next week  Elevated troponin, BNP Bilateral effusions Effusions may be related to heart failure.   We will hold off on thoracentesis for now as we attempt diuresis Continue with Lasix as tolerated Trend cardiac enzymes, check echocardiogram  Best Practice (right click and "Reselect all SmartList Selections" daily)   Diet/type: Regular consistency (see orders) DVT prophylaxis: not indicated GI prophylaxis: N/A Lines: N/A Foley:  N/A Code Status:  full code Last date of multidisciplinary goals of care discussion []   Labs   CBC: Recent Labs  Lab 10/12/20 1224  WBC 4.7  HGB 12.2*  HCT 38.8*  MCV 87.0  PLT 343    Basic Metabolic Panel: Recent Labs  Lab 10/12/20 1224  NA 139  K 3.8  CL 109  CO2 22  GLUCOSE 96  BUN 12  CREATININE 1.51*  CALCIUM 9.0   GFR: Estimated Creatinine Clearance: 67.9 mL/min (A) (by C-G formula based on SCr of 1.51 mg/dL (H)). Recent Labs  Lab 10/12/20 1224  WBC 4.7    Liver Function Tests: Recent Labs  Lab 10/12/20 1252  AST 30  ALT 19  ALKPHOS 73  BILITOT 1.1  PROT 7.3  ALBUMIN 3.3*   Recent Labs  Lab 10/12/20 1252  LIPASE 28   No results for input(s):  AMMONIA in the last 168 hours.  ABG No results found for: PHART, PCO2ART, PO2ART, HCO3, TCO2, ACIDBASEDEF, O2SAT   Coagulation Profile: No results for input(s): INR, PROTIME in the last 168 hours.  Cardiac Enzymes: No results for input(s): CKTOTAL, CKMB, CKMBINDEX, TROPONINI in the last 168 hours.  HbA1C: No results found for: HGBA1C  CBG: No results for input(s): GLUCAP in the last 168 hours.  Review of Systems:   REVIEW OF SYSTEMS:   All negative; except for those that are bolded, which indicate positives.  Constitutional: weight loss, weight gain, night sweats, fevers, chills, fatigue, weakness.  HEENT: headaches, sore throat, sneezing, nasal congestion, post nasal drip, difficulty swallowing, tooth/dental problems, visual complaints, visual changes, ear aches. Neuro: difficulty with speech, weakness, numbness, ataxia. CV:  chest pain, orthopnea, PND, swelling in lower extremities, dizziness, palpitations, syncope.  Resp: cough, hemoptysis, dyspnea, wheezing. GI: heartburn, indigestion, abdominal pain, nausea, vomiting, diarrhea, constipation, change in bowel habits, loss of appetite, hematemesis, melena, hematochezia.  GU: dysuria, change in color of urine, urgency or frequency, flank pain, hematuria. MSK: joint pain or swelling, decreased range of motion. Psych: change in mood or affect, depression, anxiety, suicidal ideations, homicidal ideations. Skin: rash, itching, bruising.   Past Medical History:  He,  has a past medical history of Ocular disease.   Surgical History:  History reviewed. No pertinent surgical history.   Social History:   reports that he has been smoking cigarettes. He has never used smokeless tobacco. He reports current alcohol use. He reports that he does not currently use drugs.   Family History:  His family history includes Diabetes in his father.   Allergies No Known Allergies   Home Medications  Prior to Admission medications    Medication Sig Start Date End Date Taking? Authorizing Provider  cetirizine (ZYRTEC) 10 MG tablet Take 10 mg by mouth daily.   Yes [provider]  ibuprofen (ADVIL,MOTRIN) 200 MG tablet Take 800 mg by mouth every 6 (six) hours as needed for moderate pain.   Yes [provider]  montelukast (SINGULAIR) 10 MG tablet Take 10 mg by mouth daily. 08/30/20  Yes [provider]  omeprazole (PRILOSEC) 40 MG capsule Take 40 mg by mouth every morning. 08/30/20  Yes [provider]  sildenafil (REVATIO) 20 MG tablet Take 60 mg by mouth as needed. 04/19/20  Yes [provider]  tamsulosin (FLOMAX) 0.4 MG CAPS capsule Take 0.8 mg by mouth every other day. 10/04/20  Yes [provider]  methocarbamol (ROBAXIN) 500 MG tablet Take 1 tablet (500 mg total) by mouth 2 (two) times daily as needed for muscle spasms. Patient not taking: No sig reported 12/05/15   Ward, Chase Picket, PA-C  naproxen (NAPROSYN) 500 MG tablet TAKE 1 TABLET (500 MG TOTAL) BY MOUTH 3 (THREE) TIMES DAILY WITH MEALS FOR 15 DAYS. Patient not taking: No sig reported 03/19/20 03/19/21  Liberty Handy, PA-C     Critical care  time: NA   Chilton Greathouse MD [facility] See Amion for pager  If no response to pager , please call [phone] until 7pm After 7:00 pm call Elink  [phone] 10/12/2020, 6:02 PM

## 2020-10-12 NOTE — ED Notes (Signed)
Pt SpO2 87%, labored breathing, endorsed SOB. Placed on 2 liters [place] with relief.

## 2020-10-12 NOTE — ED Notes (Signed)
Spoke with pt and wife about home meds and morning meds. Pt would like Flomax at this time and once daily like he has been taking. MD also messaged for clarification of orders.

## 2020-10-12 NOTE — ED Triage Notes (Signed)
Pt reports L sided chest pain, epigastric pain that radiates to back and SOB x 1 month that has been getting worse over the past week.  Seen by PCP and diagnosed with GERD.  Seen by [facility] today and told to come to ED for ? Mass on chest x-ray.

## 2020-10-12 NOTE — ED Provider Notes (Signed)
Emergency Department Provider Note   I have reviewed the triage vital signs and the nursing notes.   HISTORY  Chief Complaint Chest Pain   HPI Nathan [name] is a 55 y.o. male with past medical history reviewed below including remote smoking history, presents to the emergency department with discomfort in the chest and shortness of breath.  He states symptoms have been lingering over the past 3 to 4 weeks but acutely worsened in the past 7 days.  He saw his primary care doctor initially diagnosed him with GERD and started him on medications.  Been taking his medicines with no relief in symptoms.  He feels increasingly short of breath and is having some productive sputum.  He went to urgent care today and was referred here for further evaluation given tachycardia and abnormal chest x-ray finding.  Had a rapid COVID test at urgent care which was negative.  Patient describes pain in the chest as a pressure type sensation along with difficulty breathing.  No blood in the sputum.  No vomiting, belly pain, diarrhea. No sick contacts. No prior history of asthma or COPD.    Past Medical History:  Diagnosis Date   Allergies    09/16/2020   BPH (benign prostatic hyperplasia)    ED (erectile dysfunction)    GERD (gastroesophageal reflux disease)    Peripheral focal chorioretinal inflammation of both eyes    Retinal vasculitis of both eyes     Patient Active Problem List   Diagnosis Date Noted   Pleural effusion    Acute combined systolic and diastolic heart failure (HCC)    Sarcoidosis    Severe mitral regurgitation    Shortness of breath 10/12/2020   Mediastinal lymphadenopathy 10/12/2020   Pneumonia 10/12/2020   Chest pain 10/12/2020   Dyspnea 10/12/2020     History reviewed. No pertinent surgical history.  Allergies Patient has no known allergies.  Family History  Problem Relation Age of Onset   Cancer Father 33   Diabetes Father     Social History Social History    Tobacco Use   Smoking status: Former    Packs/day: 0.50    Types: Cigarettes    Quit date: 02/02/2010    Years since quitting: 10.7   Smokeless tobacco: Never  Substance Use Topics   Alcohol use: Yes   Drug use: Not Currently    Review of Systems  Constitutional: No fever/chills Eyes: No visual changes. ENT: No sore throat. Cardiovascular: Positive chest pain. Respiratory: Positive shortness of breath. Positive productive cough.  Gastrointestinal: No abdominal pain.  No nausea, no vomiting.  No diarrhea.  No constipation. Genitourinary: Negative for dysuria. Musculoskeletal: Negative for back pain. Skin: Negative for rash. Neurological: Negative for headaches, focal weakness or numbness.  10-point ROS otherwise negative.  ____________________________________________   PHYSICAL EXAM:  VITAL SIGNS: ED Triage Vitals  Enc Vitals Group     BP 10/12/20 1214 (!) 148/90     Pulse Rate 10/12/20 1214 (!) 116     Resp 10/12/20 1214 (!) 26     Temp 10/12/20 1214 98.4 F (36.9 C)     Temp Source 10/12/20 1214 Oral     SpO2 10/12/20 1214 97 %     Weight 10/12/20 1246 226 lb (102.5 kg)     Height 10/12/20 1246 5\' 10"  (1.778 m)   Constitutional: Alert and oriented. Well appearing. Some increased respiratory rate but speaking in full sentences. No acute distress.  Eyes: Conjunctivae are normal.  Head:  Atraumatic. Nose: No congestion/rhinnorhea. Mouth/Throat: Mucous membranes are moist.   Neck: No stridor.   Cardiovascular: Tachycardia. Good peripheral circulation. Grossly normal heart sounds.   Respiratory: Normal respiratory effort but RR is increased from normal. No retractions. Lungs w/o wheezing or rhonchi. Faint crackles at the bases bilaterally.  Gastrointestinal: Soft and nontender. No distention.  Musculoskeletal: No lower extremity tenderness nor edema. No gross deformities of extremities. Neurologic:  Normal speech and language. No gross focal neurologic deficits are  appreciated.  Skin:  Skin is warm, dry and intact. No rash noted.   ____________________________________________   LABS (all labs ordered are listed, but only abnormal results are displayed)  Labs Reviewed  BASIC METABOLIC PANEL - Abnormal; Notable for the following components:      Result Value   Creatinine, Ser 1.51 (*)    GFR, Estimated 55 (*)    All other components within normal limits  CBC - Abnormal; Notable for the following components:   Hemoglobin 12.2 (*)    HCT 38.8 (*)    All other components within normal limits  HEPATIC FUNCTION PANEL - Abnormal; Notable for the following components:   Albumin 3.3 (*)    All other components within normal limits  D-DIMER, QUANTITATIVE - Abnormal; Notable for the following components:   D-Dimer, Quant 1.68 (*)    All other components within normal limits  BRAIN NATRIURETIC PEPTIDE - Abnormal; Notable for the following components:   B Natriuretic Peptide 558.3 (*)    All other components within normal limits  COMPREHENSIVE METABOLIC PANEL - Abnormal; Notable for the following components:   CO2 21 (*)    Glucose, Bld 103 (*)    Creatinine, Ser 1.64 (*)    Calcium 8.6 (*)    Total Protein 6.2 (*)    Albumin 2.8 (*)    GFR, Estimated 50 (*)    All other components within normal limits  CBC - Abnormal; Notable for the following components:   RBC 4.04 (*)    Hemoglobin 11.2 (*)    HCT 34.6 (*)    All other components within normal limits  BASIC METABOLIC PANEL - Abnormal; Notable for the following components:   CO2 21 (*)    Glucose, Bld 102 (*)    Creatinine, Ser 1.55 (*)    Calcium 8.7 (*)    GFR, Estimated 53 (*)    All other components within normal limits  CBC - Abnormal; Notable for the following components:   RBC 4.07 (*)    Hemoglobin 11.1 (*)    HCT 34.9 (*)    All other components within normal limits  BASIC METABOLIC PANEL - Abnormal; Notable for the following components:   Sodium 134 (*)    Glucose, Bld 102  (*)    Creatinine, Ser 1.42 (*)    GFR, Estimated 59 (*)    All other components within normal limits  BASIC METABOLIC PANEL - Abnormal; Notable for the following components:   Sodium 134 (*)    Glucose, Bld 114 (*)    Creatinine, Ser 1.44 (*)    GFR, Estimated 58 (*)    All other components within normal limits  CBC - Abnormal; Notable for the following components:   RBC 4.14 (*)    Hemoglobin 11.4 (*)    HCT 34.9 (*)    All other components within normal limits  TROPONIN I (HIGH SENSITIVITY) - Abnormal; Notable for the following components:   Troponin I (High Sensitivity) 156 (*)  All other components within normal limits  TROPONIN I (HIGH SENSITIVITY) - Abnormal; Notable for the following components:   Troponin I (High Sensitivity) 164 (*)    All other components within normal limits  TROPONIN I (HIGH SENSITIVITY) - Abnormal; Notable for the following components:   Troponin I (High Sensitivity) 154 (*)    All other components within normal limits  TROPONIN I (HIGH SENSITIVITY) - Abnormal; Notable for the following components:   Troponin I (High Sensitivity) 193 (*)    All other components within normal limits  TROPONIN I (HIGH SENSITIVITY) - Abnormal; Notable for the following components:   Troponin I (High Sensitivity) 129 (*)    All other components within normal limits  TROPONIN I (HIGH SENSITIVITY) - Abnormal; Notable for the following components:   Troponin I (High Sensitivity) 120 (*)    All other components within normal limits  RESP PANEL BY RT-PCR (FLU A&B, COVID) ARPGX2  LIPASE, BLOOD  HIV ANTIBODY (ROUTINE TESTING W REFLEX)  HEMOGLOBIN A1C  MAGNESIUM  PHOSPHORUS  ANGIOTENSIN CONVERTING ENZYME  MAGNESIUM  MAGNESIUM  PHOSPHORUS   ____________________________________________  EKG   EKG Interpretation  Date/Time:  Saturday October 12 2020 12:45:41 EDT Ventricular Rate:  121 PR Interval:  142 QRS Duration: 101 QT Interval:  349 QTC  Calculation: 496 R Axis:   76 Text Interpretation: Sinus tachycardia Ventricular premature complex Probable left atrial enlargement Borderline repol abnormality, diffuse leads Borderline prolonged QT interval No acute changes inferior and lateral ST depression Confirmed by Derwood Kaplan [phone]) on 10/13/2020 2:06:18 PM        ____________________________________________  RADIOLOGY  CXR and CTA PE reviewed.   ____________________________________________   PROCEDURES  Procedure(s) performed:   Procedures  CRITICAL CARE Performed by: Maia Plan Total critical care time: 35 minutes Critical care time was exclusive of separately billable procedures and treating other patients. Critical care was necessary to treat or prevent imminent or life-threatening deterioration. Critical care was time spent personally by me on the following activities: development of treatment plan with patient and/or surrogate as well as nursing, discussions with consultants, evaluation of patient's response to treatment, examination of patient, obtaining history from patient or surrogate, ordering and performing treatments and interventions, ordering and review of laboratory studies, ordering and review of radiographic studies, pulse oximetry and re-evaluation of patient's condition.  Alona Bene, MD Emergency Medicine  ____________________________________________   INITIAL IMPRESSION / ASSESSMENT AND PLAN / ED COURSE  Pertinent labs & imaging results that were available during my care of the patient were reviewed by me and considered in my medical decision making (see chart for details).   Patient presents emergency department valuation of chest pain and shortness of breath.  Developed some productive cough.  He looks well and on exam has some increased respiratory rate but no wheezing.  Faint crackles at the bases.  X-ray described in paperwork from the urgent care showing I will are adenopathy versus  atypical infection.   Differential includes all life-threatening causes for chest pain. This includes but is not exclusive to acute coronary syndrome, aortic dissection, pulmonary embolism, cardiac tamponade, community-acquired pneumonia, pericarditis, musculoskeletal chest wall pain, etc.  Plan for labs including troponin, BNP, d-dimer, and repeat CXR. Received albuterol PTA with no relief which may explain some tachycardia. Do not see an indication for repeat treatment at this time.   02:40 PM  Troponins are mildly elevated at 156.  D-dimer at 1.6.  Doubt ACS as the primary cause of the patient's symptoms.  Leaning more towards some secondary etiology for symptoms causing heart strain and mild troponin leak.  Question PE. Will move forward with PE study.   CTA w/o PE. Plan for admit with question of new CHF and pleural effusions possibly causing symptoms.   Discussed patient's case with [facility] to request admission. Patient and family (if present) updated with plan. Care transferred to [facility] service.  I reviewed all nursing notes, vitals, pertinent old records, EKGs, labs, imaging (as available).  ____________________________________________  FINAL CLINICAL IMPRESSION(S) / ED DIAGNOSES  Final diagnoses:  Pleural effusion  Mediastinal lymphadenopathy     MEDICATIONS GIVEN DURING THIS VISIT:  Medications  pantoprazole (PROTONIX) EC tablet 40 mg (40 mg Oral Given 10/15/20 1114)  methocarbamol (ROBAXIN) tablet 500 mg (500 mg Oral Given 10/15/20 0303)  loratadine (CLARITIN) tablet 10 mg (10 mg Oral Given 10/15/20 1113)  montelukast (SINGULAIR) tablet 10 mg (10 mg Oral Given 10/15/20 1113)  enoxaparin (LOVENOX) injection 40 mg (40 mg Subcutaneous Given 10/15/20 1805)  sodium chloride flush (NS) 0.9 % injection 3 mL (3 mLs Intravenous Given 10/15/20 2126)  sodium chloride flush (NS) 0.9 % injection 3 mL (has no administration in time range)  0.9 %  sodium chloride infusion (has no administration in  time range)  acetaminophen (TYLENOL) tablet 650 mg (650 mg Oral Given 10/15/20 0756)    Or  acetaminophen (TYLENOL) suppository 650 mg ( Rectal See Alternative 10/15/20 0756)  docusate sodium (COLACE) capsule 100 mg (100 mg Oral Given 10/15/20 2125)  polyethylene glycol (MIRALAX / GLYCOLAX) packet 17 g (has no administration in time range)  aspirin EC tablet 81 mg (81 mg Oral Given 10/15/20 1112)  ondansetron (ZOFRAN) tablet 4 mg (has no administration in time range)    Or  ondansetron (ZOFRAN) injection 4 mg (has no administration in time range)  tamsulosin (FLOMAX) capsule 0.8 mg (0.8 mg Oral Not Given 10/15/20 1112)  cefTRIAXone (ROCEPHIN) 2 g in sodium chloride 0.9 % 100 mL IVPB (2 g Intravenous New Bag/Given 10/15/20 1524)  tamsulosin (FLOMAX) capsule 0.8 mg (0.8 mg Oral Given 10/15/20 1113)  feeding supplement (ENSURE ENLIVE / ENSURE PLUS) liquid 237 mL (0 mLs Oral Hold 10/15/20 1521)  albuterol (PROVENTIL) (2.5 MG/3ML) 0.083% nebulizer solution 2.5 mg (2.5 mg Nebulization Given 10/15/20 0808)  nitroGLYCERIN (NITROSTAT) SL tablet 0.4 mg (0.4 mg Sublingual Given 10/15/20 0231)  morphine 2 MG/ML injection 2 mg (2 mg Intravenous Given 10/14/20 0609)  sodium chloride flush (NS) 0.9 % injection 3 mL (3 mLs Intravenous Given 10/15/20 2125)  sodium chloride flush (NS) 0.9 % injection 3 mL (has no administration in time range)  0.9 %  sodium chloride infusion (has no administration in time range)  azithromycin (ZITHROMAX) tablet 500 mg (500 mg Oral Given 10/15/20 1114)  multivitamin with minerals tablet 1 tablet (1 tablet Oral Given 10/15/20 1115)  furosemide (LASIX) injection 40 mg (40 mg Intravenous Given 10/15/20 1805)  potassium chloride SA (KLOR-CON) CR tablet 20 mEq (20 mEq Oral Given 10/15/20 2125)  oxyCODONE-acetaminophen (PERCOCET/ROXICET) 5-325 MG per tablet 1-2 tablet (has no administration in time range)  predniSONE (DELTASONE) tablet 40 mg (40 mg Oral Given 10/15/20 1316)  0.9 %  sodium chloride  infusion ( Intravenous New Bag/Given 10/16/20 0505)  morphine 4 MG/ML injection 4 mg (4 mg Intravenous Given 10/12/20 1352)  ondansetron (ZOFRAN) injection 4 mg (4 mg Intravenous Given 10/12/20 1351)  iohexol (OMNIPAQUE) 350 MG/ML injection 75 mL (75 mLs Intravenous Contrast Given 10/12/20 1520)  cefTRIAXone (ROCEPHIN) 1 g  in sodium chloride 0.9 % 100 mL IVPB (0 g Intravenous Stopped 10/12/20 1730)  furosemide (LASIX) injection 20 mg (20 mg Intravenous Given 10/12/20 1647)  influenza vac split quadrivalent PF (FLUARIX) injection 0.5 mL (0.5 mLs Intramuscular Given 10/15/20 1317)  potassium chloride SA (KLOR-CON) CR tablet 40 mEq (40 mEq Oral Given 10/15/20 1114)  aspirin chewable tablet 81 mg (81 mg Oral Given 10/16/20 0537)       Note:  This document was prepared using Dragon voice recognition software and may include unintentional dictation errors.  Alona BeneJoshua Romolo Sieling, MD, [facility] Emergency Medicine    Benedetta Sundstrom, Arlyss RepressJoshua G, MD 10/16/20 [phone]

## 2020-10-13 ENCOUNTER — Encounter (HOSPITAL_COMMUNITY): Payer: Self-pay | Admitting: Internal Medicine

## 2020-10-13 ENCOUNTER — Inpatient Hospital Stay (HOSPITAL_COMMUNITY): Payer: 59

## 2020-10-13 DIAGNOSIS — R0600 Dyspnea, unspecified: Secondary | ICD-10-CM | POA: Diagnosis not present

## 2020-10-13 DIAGNOSIS — R0602 Shortness of breath: Secondary | ICD-10-CM | POA: Diagnosis not present

## 2020-10-13 DIAGNOSIS — R590 Localized enlarged lymph nodes: Secondary | ICD-10-CM

## 2020-10-13 DIAGNOSIS — I5041 Acute combined systolic (congestive) and diastolic (congestive) heart failure: Secondary | ICD-10-CM | POA: Diagnosis not present

## 2020-10-13 DIAGNOSIS — D869 Sarcoidosis, unspecified: Secondary | ICD-10-CM | POA: Diagnosis not present

## 2020-10-13 DIAGNOSIS — R0609 Other forms of dyspnea: Secondary | ICD-10-CM | POA: Diagnosis not present

## 2020-10-13 DIAGNOSIS — R079 Chest pain, unspecified: Secondary | ICD-10-CM | POA: Diagnosis not present

## 2020-10-13 DIAGNOSIS — I340 Nonrheumatic mitral (valve) insufficiency: Secondary | ICD-10-CM

## 2020-10-13 LAB — COMPREHENSIVE METABOLIC PANEL
ALT: 16 U/L (ref 0–44)
AST: 25 U/L (ref 15–41)
Albumin: 2.8 g/dL — ABNORMAL LOW (ref 3.5–5.0)
Alkaline Phosphatase: 63 U/L (ref 38–126)
Anion gap: 8 (ref 5–15)
BUN: 13 mg/dL (ref 6–20)
CO2: 21 mmol/L — ABNORMAL LOW (ref 22–32)
Calcium: 8.6 mg/dL — ABNORMAL LOW (ref 8.9–10.3)
Chloride: 107 mmol/L (ref 98–111)
Creatinine, Ser: 1.64 mg/dL — ABNORMAL HIGH (ref 0.61–1.24)
GFR, Estimated: 50 mL/min — ABNORMAL LOW (ref >60)
Glucose, Bld: 103 mg/dL — ABNORMAL HIGH (ref 70–99)
Potassium: 3.7 mmol/L (ref 3.5–5.1)
Sodium: 136 mmol/L (ref 135–145)
Total Bilirubin: 1.1 mg/dL (ref 0.3–1.2)
Total Protein: 6.2 g/dL — ABNORMAL LOW (ref 6.5–8.1)

## 2020-10-13 LAB — CBC
HCT: 34.6 % — ABNORMAL LOW (ref 39.0–52.0)
Hemoglobin: 11.2 g/dL — ABNORMAL LOW (ref 13.0–17.0)
MCH: 27.7 pg (ref 26.0–34.0)
MCHC: 32.4 g/dL (ref 30.0–36.0)
MCV: 85.6 fL (ref 80.0–100.0)
Platelets: 211 10*3/uL (ref 150–400)
RBC: 4.04 MIL/uL — ABNORMAL LOW (ref 4.22–5.81)
RDW: 15.1 % (ref 11.5–15.5)
WBC: 5 10*3/uL (ref 4.0–10.5)
nRBC: 0 % (ref 0.0–0.2)

## 2020-10-13 LAB — MAGNESIUM: Magnesium: 1.8 mg/dL (ref 1.7–2.4)

## 2020-10-13 LAB — ECHOCARDIOGRAM COMPLETE
Calc EF: 31.4 %
Height: 70 in
MV M vel: 4.49 m/s
MV Peak grad: 80.6 mmHg
Radius: 0.5 cm
S' Lateral: 5.5 cm
Single Plane A2C EF: 25.2 %
Single Plane A4C EF: 36.9 %
Weight: 3569.69 oz

## 2020-10-13 LAB — HEMOGLOBIN A1C
Hgb A1c MFr Bld: 4.9 % (ref 4.8–5.6)
Mean Plasma Glucose: 93.93 mg/dL

## 2020-10-13 LAB — PHOSPHORUS: Phosphorus: 3.7 mg/dL (ref 2.5–4.6)

## 2020-10-13 LAB — HIV ANTIBODY (ROUTINE TESTING W REFLEX): HIV Screen 4th Generation wRfx: NONREACTIVE

## 2020-10-13 LAB — TROPONIN I (HIGH SENSITIVITY)
Troponin I (High Sensitivity): 154 ng/L (ref <18)
Troponin I (High Sensitivity): 193 ng/L (ref <18)

## 2020-10-13 MED ORDER — ENSURE ENLIVE PO LIQD
237.0000 mL | Freq: Two times a day (BID) | ORAL | Status: DC
  Administered 2020-10-13 – 2020-10-22 (×15): 237 mL via ORAL

## 2020-10-13 MED ORDER — ALBUTEROL SULFATE (2.5 MG/3ML) 0.083% IN NEBU
2.5000 mg | INHALATION_SOLUTION | RESPIRATORY_TRACT | Status: DC | PRN
  Administered 2020-10-14 – 2020-10-15 (×2): 2.5 mg via RESPIRATORY_TRACT
  Filled 2020-10-13 (×2): qty 3

## 2020-10-13 MED ORDER — INFLUENZA VAC SPLIT QUAD 0.5 ML IM SUSY
0.5000 mL | PREFILLED_SYRINGE | INTRAMUSCULAR | Status: AC
  Administered 2020-10-15: 0.5 mL via INTRAMUSCULAR
  Filled 2020-10-13 (×2): qty 0.5

## 2020-10-13 NOTE — Progress Notes (Signed)
NAME:  Nathan [name], MRN:  [id number], DOB:  1965-09-18, LOS: 1 ADMISSION DATE:  10/12/2020, CONSULTATION DATE:  10/12/2020 REFERRING MD:  Joycelyn Das MD, CHIEF COMPLAINT:  Abnormal CT scan   History of Present Illness:  55 Y/O with remote smoking history, peripheral focal chorioretinal inflammation eyes  Admitted with dyspnea, chest discomfort.  CT scan on admission significant for small bilateral pleural effusion, mediastinal lymphadenopathy  Pertinent  Medical History    has a past medical history of Ocular disease.  He has history of peripheral focal chorioretinal inflammation of both eyes diagnosed in 2020.  Followed at [facility] ophthalmology.  He was treated with methotrexate and folic acid but self discontinued after 6 months in 2021.  He has not followed up with his ophthalmologist.  Remote smoker in his 20 to 30s  Has a [employer] company.  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  No pets  Significant Hospital Events: Including procedures, antibiotic start and stop dates in addition to other pertinent events   9/10 admit  Interim History / Subjective:    Objective   Blood pressure 115/88, pulse (!) 107, temperature 98.3 F (36.8 C), temperature source Oral, resp. rate 16, height 5\' 10"  (1.778 m), weight 101.2 kg, SpO2 96 %.        Intake/Output Summary (Last 24 hours) at 10/13/2020 1035 Last data filed at 10/13/2020 0500 Gross per 24 hour  Intake --  Output 1350 ml  Net -1350 ml   Filed Weights   10/12/20 1246 10/13/20 0500  Weight: 102.5 kg 101.2 kg    Examination: Gen:      No acute distress HEENT:  EOMI, sclera anicteric Neck:     No masses; no thyromegaly Lungs:    Clear to auscultation bilaterally; normal respiratory effort CV:         Regular rate and rhythm; no murmurs Abd:      + bowel sounds; soft, non-tender; no palpable masses, no distension Ext:    No edema; adequate peripheral perfusion Skin:      Warm and dry; no  rash Neuro: alert and oriented x 3 Psych: normal mood and affect   Lab/imaging personally reviewed CT significant for bulky mediastinal, bilateral hilar lymphadenopathy, groundglass opacity bilaterally with bilateral pleural effusion.  Troponin 156, BNP 558 Hepatic panel within normal limits, creatinine 1.51  Resolved Hospital Problem list     Assessment & Plan:  Bilateral mediastinal and hilar lymphadenopathy Suspect either sarcoidosis or lymphoproliferative disorder.  Sarcoidosis is more likely given history of chorioretinal inflammation in the past.  He may have a community-acquired pneumonia but the size of the lymph nodes is out of proportion.  Continue ceftriaxone, azithromycin Avoid steroids until diagnosis is made Supplemental oxygen ACE level is pending With plan for bronchoscope he stabilized and after review of echocardiogram.  Cardiology has already been consulted by primary team Tentatively plan for tomorrow morning pending availability in the Endo suite Keep n.p.o. after midnight  Elevated troponin, BNP Bilateral effusions Effusions may be related to heart failure.   We will hold off on thoracentesis for now as we attempt diuresis Trend cardiac enzymes, check echocardiogram  Nathan [name] and his wife updated today at bedside  Best Practice (right click and "Reselect all SmartList Selections" daily)   Diet/type: Regular consistency (see orders) DVT prophylaxis: not indicated GI prophylaxis: N/A Lines: N/A Foley:  N/A Code Status:  full code Last date of multidisciplinary goals of care discussion []   Signature:   Chilton Greathouse MD [facility] See Amion for pager  If no response to pager , please call [phone] until 7pm After 7:00 pm call Elink  [phone] 10/13/2020, 10:35 AM

## 2020-10-13 NOTE — Progress Notes (Signed)
Notified by RN that pt having some indigestion. Denies "chest pain" or SOB. No nausea or vomiting. Had elevated troponin levels when admitted. Last troponin level was yesterday afternoon at 2:25 and was 164.  Echo ordered for am.   EKG done and shows tachycardia to 112 with nonspecific ST changes but no acute ST elevation or depression. Has prolonged QT interval.  Pt awake and alert x3. No complaints of pain, SOB, diaphoresis or nausea/vomiting.   Check serial troponin. Obtain Echo in am as planned.  If troponin has significantly increased will consult cardiology

## 2020-10-13 NOTE — Progress Notes (Signed)
PROGRESS NOTE  Nathan [name] ZJQ:[id number] DOB: Jun 19, 1965 DOA: 10/12/2020 PCP: Dois Davenport, MD   LOS: 1 day   Brief narrative: Nathan [name] is a 55 y.o. male with no significant past medical history presented to hospital with 3 to 4 weeks of shortness of breath and chest discomfort for 3 to 4 weeks.    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  Chest x-ray was done which showed some hilar lymphadenopathy.  Was initially noted to be hypoxic with pulse ox of 87% on room air.  Patient was also noted to be tachycardic.  Patient was then transferred to the ED for further evaluation.  In the ED, patient was noted to have elevated BNP of 558.  D- dimer was elevated at 1.6, so CT angiogram of the chest was performed which showed a hilar and midsternal lymphadenopathy, pleural effusion.  COVID test and influenza was negative.  Creatinine was elevated at 1.5.  Chest x-ray showed patchy interstitial and hazy airspace opacities suggestive of multifocal pneumonia.  Patient also received Rocephin and Zithromax and 20 mg of Lasix in the ED.  Patient was then admitted hospital for further evaluation and treatment.   Assessment/Plan:  Principal Problem:   Shortness of breath Active Problems:   Mediastinal lymphadenopathy   Pneumonia   Chest pain   Dyspnea   Dyspnea, chest pain and shortness of breath.   Chest x-ray with multifocal pneumonia.  continue with Rocephin and Zithromax for now.  CT scan shows bilateral hilar and mediastinal lymphadenopathy with pleural effusion.  Possibility of lymphoproliferative disorder/sarcoidosis.  HIV pending.  Pulmonary on board.  Continue with nebulizer, supportive care, incentive spirometry.  Holding up with steroids for possibility of lymphoproliferative disorder.  Might need bronchoscopy . Check echo  Multifocal pneumonia, atypical pneumonia.  Continue Rocephin and  Zithromax.  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  Influenza and COVID was negative.   Hilar, midsternal lymphadenopathy. As per the CT angiogram of the chest.  Likely secondary to sarcoidosis or lymphoproliferative disorder.  Pulmonary on board.  Elevated creatinine.  Creatinine of 1.5 at this time ,we will continue to monitor closely.  Hold NSAIDs for now.  Received 1 dose of IV Lasix in the ED.  Will monitor closely.  Creatinine on 02/07/2019 was 1.3 as reviewed from care everywhere. Hold further doses for now.   Elevated troponin.   EKG with tachycardia.  Chest pain for almost 1 month constant.  Atypical chest pain.  Has elevated creatinine as well.  Could not rule out cardiac involvement by systemic disease,  Check 2D echocardiogram for assessing regional wall motion abnormality.  Rule out cardiac involvement/pericarditis.  Continue aspirin for now. Will consult with cardiology.    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  Was on steroids for 6 months in the past was seen by ophthalmologist in the past.    DVT prophylaxis: enoxaparin (LOVENOX) injection 40 mg Start: 10/12/20 1730   Code Status: Full code  Family Communication: spoke with the wife on the phone.   Status is: Inpatient  Remains inpatient appropriate because:IV treatments appropriate due to intensity of illness or inability to take PO and Inpatient level of care appropriate due to severity of illness  Dispo: The patient is from: Home              Anticipated d/c is to: Home  Patient currently is not medically stable to d/c.   Difficult to place patient No  Consultants: Pulmonary Cardiology   Procedures: None  Anti-infectives:  Rocephin and Zithromax  Anti-infectives (From admission, onward)    Start     Dose/Rate Route Frequency Ordered Stop   10/13/20 1600  cefTRIAXone (ROCEPHIN) 2 g in sodium chloride 0.9 % 100 mL IVPB        2 g 200 mL/hr over 30 Minutes Intravenous Every 24 hours  10/12/20 1737     10/12/20 1745  cefTRIAXone (ROCEPHIN) 1 g in sodium chloride 0.9 % 100 mL IVPB        1 g 200 mL/hr over 30 Minutes Intravenous  Once 10/12/20 1737     10/12/20 1730  cefTRIAXone (ROCEPHIN) 1 g in sodium chloride 0.9 % 100 mL IVPB  Status:  Discontinued        1 g 200 mL/hr over 30 Minutes Intravenous Every 24 hours 10/12/20 1727 10/12/20 1735   10/12/20 1730  azithromycin (ZITHROMAX) 500 mg in sodium chloride 0.9 % 250 mL IVPB        500 mg 250 mL/hr over 60 Minutes Intravenous Every 24 hours 10/12/20 1727     10/12/20 1615  cefTRIAXone (ROCEPHIN) 1 g in sodium chloride 0.9 % 100 mL IVPB        1 g 200 mL/hr over 30 Minutes Intravenous  Once 10/12/20 1608 10/12/20 1730   10/12/20 1615  azithromycin (ZITHROMAX) 500 mg in sodium chloride 0.9 % 250 mL IVPB  Status:  Discontinued        500 mg 250 mL/hr over 60 Minutes Intravenous  Once 10/12/20 1608 10/12/20 1736       Subjective: Today, patient was seen and examined at bedside. Complains of toe pain. Cough, sputum production and chest discomfort with DOE.  Objective: Vitals:   10/13/20 0715 10/13/20 0735  BP: 115/88   Pulse: (!) 107   Resp: 16   Temp: 98.3 F (36.8 C)   SpO2: 92% 96%    Intake/Output Summary (Last 24 hours) at 10/13/2020 0918 Last data filed at 10/13/2020 0500 Gross per 24 hour  Intake --  Output 1350 ml  Net -1350 ml   Filed Weights   10/12/20 1246 10/13/20 0500  Weight: 102.5 kg 101.2 kg   Body mass index is 32.01 kg/m.   Physical Exam:  GENERAL: Patient is alert awake and oriented. Not in obvious distress. On nasal canula oxygen. HENT: No scleral pallor or icterus. Pupils equally reactive to light. Oral mucosa is moist NECK: is supple, no gross swelling noted. CHEST: Diminished breath sounds bilaterally. CVS: S1 and S2 heard, no murmur. Regular rate and rhythm.  ABDOMEN: Soft, non-tender, bowel sounds are present. EXTREMITIES: No edema. CNS: Cranial nerves are intact. No  focal motor deficits. SKIN: warm and dry without rashes.  Data Review: I have personally reviewed the following laboratory data and studies,  CBC: Recent Labs  Lab 10/12/20 1224 10/13/20 0237  WBC 4.7 5.0  HGB 12.2* 11.2*  HCT 38.8* 34.6*  MCV 87.0 85.6  PLT 343 211   Basic Metabolic Panel: Recent Labs  Lab 10/12/20 1224 10/13/20 0237  NA 139 136  K 3.8 3.7  CL 109 107  CO2 22 21*  GLUCOSE 96 103*  BUN 12 13  CREATININE 1.51* 1.64*  CALCIUM 9.0 8.6*  MG  --  1.8  PHOS  --  3.7   Liver Function Tests: Recent Labs  Lab 10/12/20 1252 10/13/20  0237  AST 30 25  ALT 19 16  ALKPHOS 73 63  BILITOT 1.1 1.1  PROT 7.3 6.2*  ALBUMIN 3.3* 2.8*   Recent Labs  Lab 10/12/20 1252  LIPASE 28   No results for input(s): AMMONIA in the last 168 hours. Cardiac Enzymes: No results for input(s): CKTOTAL, CKMB, CKMBINDEX, TROPONINI in the last 168 hours. BNP (last 3 results) Recent Labs    10/12/20 1253  BNP 558.3*    ProBNP (last 3 results) No results for input(s): PROBNP in the last 8760 hours.  CBG: No results for input(s): GLUCAP in the last 168 hours. Recent Results (from the past 240 hour(s))  Resp Panel by RT-PCR (Flu A&B, Covid) Nasopharyngeal Swab     Status: None   Collection Time: 10/12/20  1:20 PM   Specimen: Nasopharyngeal Swab; Nasopharyngeal(NP) swabs in vial transport medium  Result Value Ref Range Status   SARS Coronavirus 2 by RT PCR NEGATIVE NEGATIVE Final    Comment: (NOTE) SARS-CoV-2 target nucleic acids are NOT DETECTED.  The SARS-CoV-2 RNA is generally detectable in upper respiratory specimens during the acute phase of infection. The lowest concentration of SARS-CoV-2 viral copies this assay can detect is 138 copies/mL. A negative result does not preclude SARS-Cov-2 infection and should not be used as the sole basis for treatment or other patient management decisions. A negative result may occur with  improper specimen collection/handling,  submission of specimen other than nasopharyngeal swab, presence of viral mutation(s) within the areas targeted by this assay, and inadequate number of viral copies(<138 copies/mL). A negative result must be combined with clinical observations, patient history, and epidemiological information. The expected result is Negative.  Fact Sheet for Patients:  BloggerCourse.com  Fact Sheet for Healthcare Providers:  SeriousBroker.it  This test is no t yet approved or cleared by the [place] FDA and  has been authorized for detection and/or diagnosis of SARS-CoV-2 by FDA under an Emergency Use Authorization (EUA). This EUA will remain  in effect (meaning this test can be used) for the duration of the COVID-19 declaration under Section 564(b)(1) of the Act, 21 U.S.C.section 360bbb-3(b)(1), unless the authorization is terminated  or revoked sooner.       Influenza A by PCR NEGATIVE NEGATIVE Final   Influenza B by PCR NEGATIVE NEGATIVE Final    Comment: (NOTE) The Xpert Xpress SARS-CoV-2/FLU/RSV plus assay is intended as an aid in the diagnosis of influenza from Nasopharyngeal swab specimens and should not be used as a sole basis for treatment. Nasal washings and aspirates are unacceptable for Xpert Xpress SARS-CoV-2/FLU/RSV testing.  Fact Sheet for Patients: BloggerCourse.com  Fact Sheet for Healthcare Providers: SeriousBroker.it  This test is not yet approved or cleared by the [place] FDA and has been authorized for detection and/or diagnosis of SARS-CoV-2 by FDA under an Emergency Use Authorization (EUA). This EUA will remain in effect (meaning this test can be used) for the duration of the COVID-19 declaration under Section 564(b)(1) of the Act, 21 U.S.C. section 360bbb-3(b)(1), unless the authorization is terminated or revoked.  Performed at [facility], [address], [place], [place] [address]      Studies: DG Chest 2 View  Result Date: 10/12/2020 CLINICAL DATA:  Chest pain and shortness of breath. EXAM: CHEST - 2 VIEW COMPARISON:  Chest x-ray dated July 20, 2019. FINDINGS: The heart size and mediastinal contours are within normal limits. Patchy interstitial and hazy airspace opacities in both lungs. No pleural effusion or pneumothorax.  No acute osseous abnormality. IMPRESSION: 1. Bilateral patchy interstitial and hazy airspace opacities, concerning for multifocal pneumonia, less likely pulmonary edema. Electronically Signed   By: Obie Dredge M.D.   On: 10/12/2020 14:54   CT Angio Chest PE W and/or Wo Contrast  Result Date: 10/12/2020 CLINICAL DATA:  Suspected pulmonary embolus. EXAM: CT ANGIOGRAPHY CHEST WITH CONTRAST TECHNIQUE: Multidetector CT imaging of the chest was performed using the standard protocol during bolus administration of intravenous contrast. Multiplanar CT image reconstructions and MIPs were obtained to evaluate the vascular anatomy. CONTRAST:  64mL OMNIPAQUE IOHEXOL 350 MG/ML SOLN COMPARISON:  Jun 30, 2019 FINDINGS: Cardiovascular: Satisfactory opacification of the pulmonary arteries to the segmental level. No evidence of pulmonary embolism. Normal heart size. No pericardial effusion. Mediastinum/Nodes: Conglomerates of bulky abnormal lymph nodes are seen in right pretracheal, left prevascular, bilateral hilar and infra carinal regions. These measure up to 5 cm in diameter. Lungs/Pleura: Bilateral pleural effusions, small on the left and moderate on the right. Patchy ground-glass opacities with central predominance are seen in both lungs, disproportionately affecting the right upper lobe. Upper Abdomen: No acute abnormality. Musculoskeletal: No chest wall abnormality. No acute or significant osseous findings. Review of the MIP images confirms the above findings. IMPRESSION: 1. No evidence of pulmonary embolism. 2. Multifocal bulky  mediastinal and hilar lymphadenopathy. These may represent lymphoproliferative disorder, malignant or reactive lymphadenopathy. 3. Bilateral pleural effusions, small on the left and moderate on the right. 4. Patchy ground-glass opacities with central predominance in both lungs, disproportionately affecting the right upper lobe. This may represent infectious/inflammatory pneumonitis or asymmetric mixed pattern pulmonary edema. Electronically Signed   By: Ted Mcalpine M.D.   On: 10/12/2020 15:52      Joycelyn Das, MD  [facility] 10/13/2020  If 7PM-7AM, please contact night-coverage

## 2020-10-13 NOTE — Progress Notes (Signed)
   10/12/20 2233  Assess: MEWS Score  Temp 98.9 F (37.2 C)  BP 108/75  Pulse Rate (!) 112  Resp 18  SpO2 96 %  O2 Device Nasal Cannula  O2 Flow Rate (L/min) 2 L/min  Assess: MEWS Score  MEWS Temp 0  MEWS Systolic 0  MEWS Pulse 2  MEWS RR 0  MEWS LOC 0  MEWS Score 2  MEWS Score Color Yellow  Assess: if the MEWS score is Yellow or Red  Were vital signs taken at a resting state? Yes  Focused Assessment No change from prior assessment (per pt HR and RR improved from ED)  Early Detection of Sepsis Score *See Row Information* Low  MEWS guidelines implemented *See Row Information* Yes  Treat  MEWS Interventions Other (Comment) (no intervention needed at this time)  Pain Scale 0-10  Pain Score 0  Take Vital Signs  Increase Vital Sign Frequency  Yellow: Q 2hr X 2 then Q 4hr X 2, if remains yellow, continue Q 4hrs  Escalate  MEWS: Escalate Yellow: discuss with charge nurse/RN and consider discussing with provider and RRT  Notify: Charge Nurse/RN  Name of Charge Nurse/RN Notified Raymond RN  Date Charge Nurse/RN Notified 10/12/20  Time Charge Nurse/RN Notified 2242   Dr. Rachael Darby notified at 10/13/20 0003 via text page, provider responded back at 0020. EKG ordered for reassessment. No acute change in patient status since admission to floor.

## 2020-10-13 NOTE — Progress Notes (Addendum)
5701: Dr. Rachael Darby text paged regarding second serial troponin result of 193, up from earlier value 154. Awaiting call back.   7793: Provider called, CTM as Echo planned already.

## 2020-10-13 NOTE — Progress Notes (Signed)
  Echocardiogram 2D Echocardiogram has been performed.  Augustine Radar 10/13/2020, 12:02 PM

## 2020-10-13 NOTE — Progress Notes (Signed)
0132: EKG unable to upload into the EMR. Copy of in the paper chart. Preliminary result given to Dr. Rachael Darby via text page. Awaiting further recommendation.   Provider evaluated patient at bedside.  9924: Dr. Rachael Darby text paged about critical lab value. "FYI critical lab value Troponin I 154 this AM but down from yesterday 164."

## 2020-10-13 NOTE — Consult Note (Signed)
Cardiology Consultation:   Patient ID: Nathan [name] MRN: [id number]; DOB: [birth year]  Admit date: 10/12/2020 Date of Consult: 10/13/2020  PCP:  Dois Davenport, MD   [facility] Providers Cardiologist:  Chilton Si, MD new     Patient Profile:   Nathan [name] is a 55 y.o. male with no hx of cardiac issues who is being seen 10/13/2020 for the evaluation of chest pain and likely sarcoid, at the request of Dr Tyson Babinski.  History of Present Illness:   Nathan [name] has had chest pain for a month. However, it only bothered him when he tried to lay down. He would lay down, and the chest pain would start after a while. It was a dull, aching pain, associated w/ SOB. 7/10. He would sit up, and take deep breaths. The pain would resolve in about 30". Sometimes he would take Ibuprofen, but it did not really help.   He saw his PCP and she gave him Singulair and Prilosec for allergies and reflux, but those meds did not seem to help. He did not take them consistently.   He was also noticing increasing DOE. No LE edema, but some leg cramping. Also had cramping in his hands.   Symptoms gradually worsened.   Finally, he got tired of dealing with it and came to the ER.   In the ER, nebulizers helped the breathing. IV Lasix helped his breathing. He is on ABX as well.  He had a chest CTA 02/12/2020 revealed bulky lymphadenopathy concerning for lymphoproliferative disorder or malignant lymphadenopathy.  He also had bilateral pleural effusions and groundglass opacities centrally in both lungs.  This was concerning for infectious/inflammatory pneumonitis or asymmetric pulmonary edema.  The CT was negative for PE.  High-sensitivity troponin was elevated to 193 and relatively flat.  BNP was elevated to 550.  He had an echocardiogram.  On my review his LVEF is significantly reduced and he has focal wall motion abnormalities in the lateral regions.  His left ventricle is dilated.  He has at least moderate  mitral regurgitation.  Given his history of chorioretinal inflammation in both eyes, there is concerned that he has sarcoidosis.  He was treated with 6 months of steroids.  An ACE level is pending.   Past Medical History:  Diagnosis Date   Allergies    09/16/2020   BPH (benign prostatic hyperplasia)    ED (erectile dysfunction)    GERD (gastroesophageal reflux disease)    Peripheral focal chorioretinal inflammation of both eyes    Retinal vasculitis of both eyes     History reviewed. No pertinent surgical history.   Home Medications:  Prior to Admission medications   Medication Sig Start Date End Date Taking? Authorizing Provider  cetirizine (ZYRTEC) 10 MG tablet Take 10 mg by mouth daily.   Yes [provider]  ibuprofen (ADVIL,MOTRIN) 200 MG tablet Take 800 mg by mouth every 6 (six) hours as needed for moderate pain.   Yes [provider]  montelukast (SINGULAIR) 10 MG tablet Take 10 mg by mouth daily. 08/30/20  Yes [provider]  omeprazole (PRILOSEC) 40 MG capsule Take 40 mg by mouth every morning. 08/30/20  Yes [provider]  sildenafil (REVATIO) 20 MG tablet Take 60 mg by mouth as needed. 04/19/20  Yes [provider]  tamsulosin (FLOMAX) 0.4 MG CAPS capsule Take 0.8 mg by mouth every other day. 10/04/20  Yes [provider]  methocarbamol (ROBAXIN) 500 MG tablet Take 1 tablet (500 mg  total) by mouth 2 (two) times daily as needed for muscle spasms. Patient not taking: No sig reported 12/05/15   Ward, Chase Picket, PA-C  naproxen (NAPROSYN) 500 MG tablet TAKE 1 TABLET (500 MG TOTAL) BY MOUTH 3 (THREE) TIMES DAILY WITH MEALS FOR 15 DAYS. Patient not taking: No sig reported 03/19/20 03/19/21  Liberty Handy, PA-C    Inpatient Medications: Scheduled Meds:  aspirin EC  81 mg Oral Daily   docusate sodium  100 mg Oral BID   enoxaparin (LOVENOX) injection  40 mg Subcutaneous Q24H   feeding supplement  237 mL Oral BID BM    [START ON 10/14/2020] influenza vac split quadrivalent PF  0.5 mL Intramuscular Tomorrow-1000   loratadine  10 mg Oral Daily   montelukast  10 mg Oral Daily   pantoprazole  40 mg Oral Daily   sodium chloride flush  3 mL Intravenous Q12H   tamsulosin  0.8 mg Oral QODAY   tamsulosin  0.8 mg Oral Daily   Continuous Infusions:  sodium chloride     azithromycin Stopped (10/12/20 1852)   cefTRIAXone (ROCEPHIN)  IV     cefTRIAXone (ROCEPHIN)  IV     PRN Meds: sodium chloride, acetaminophen **OR** acetaminophen, albuterol, HYDROcodone-acetaminophen, methocarbamol, ondansetron **OR** ondansetron (ZOFRAN) IV, polyethylene glycol, sodium chloride flush  Allergies:   No Known Allergies  Social History:   Social History   Socioeconomic History   Marital status: Married    Spouse name: Not on file   Number of children: Not on file   Years of education: Not on file   Highest education level: Not on file  Occupational History   Not on file  Tobacco Use   Smoking status: Former    Packs/day: 0.50    Types: Cigarettes    Quit date: 02/02/2010    Years since quitting: 10.7   Smokeless tobacco: Never  Substance and Sexual Activity   Alcohol use: Yes   Drug use: Not Currently   Sexual activity: Not on file  Other Topics Concern   Not on file  Social History Narrative   Not on file   Social Determinants of Health   Financial Resource Strain: Not on file  Food Insecurity: Not on file  Transportation Needs: Not on file  Physical Activity: Not on file  Stress: Not on file  Social Connections: Not on file  Intimate Partner Violence: Not on file    Family History:   Family History  Problem Relation Age of Onset   Cancer Father 70   Diabetes Father      ROS:  Please see the history of present illness.  All other ROS reviewed and negative.     Physical Exam/Data:   Vitals:   10/13/20 0500 10/13/20 0715 10/13/20 0735 10/13/20 1300  BP: 101/75 115/88  115/86  Pulse: (!) 104 (!)  107  (!) 117  Resp: 20 16  20   Temp: 98.1 F (36.7 C) 98.3 F (36.8 C)  98.5 F (36.9 C)  TempSrc: Oral Oral  Oral  SpO2: 93% 92% 96% 99%  Weight:      Height:        Intake/Output Summary (Last 24 hours) at 10/13/2020 1406 Last data filed at 10/13/2020 0500 Gross per 24 hour  Intake --  Output 1350 ml  Net -1350 ml   Last 3 Weights 10/13/2020 10/12/2020  Weight (lbs) 223 lb 1.7 oz 226 lb  Weight (kg) 101.2 kg 102.513 kg     Body mass  index is 32.01 kg/m.  General:  Well nourished, well developed, +SOB HEENT: normal Lymph: no adenopathy Neck: JVD 9 cm Endocrine:  No thryomegaly Vascular: No carotid bruits; 4/4 extremity pulses 2+ Cardiac:  normal S1, S2; RRR; no murmur  Lungs: rales and some rhonchi bilaterally, no wheezing Abd: soft, nontender, no hepatomegaly  Ext: no edema Musculoskeletal:  No deformities, BUE and BLE strength normal and equal Skin: warm and dry  Neuro:  CNs 2-12 intact, no focal abnormalities noted Psych:  Normal affect   EKG:  The EKG was personally reviewed and demonstrates:  Sinus tach, HR 110, PVCs Telemetry:  Telemetry was personally reviewed and demonstrates:  SR, mostly sinus tach, frequent PVCs  Relevant CV Studies:  Echo: 10/13/2020  1. Left ventricular ejection fraction, by estimation, is 30 to 35%. The  left ventricle has moderately decreased function. The left ventricle  demonstrates global hypokinesis. The left ventricular internal cavity size was severely dilated. Left ventricular diastolic parameters are indeterminate.   2. Right ventricular systolic function is normal. The right ventricular  size is normal. There is moderately elevated pulmonary artery systolic pressure.   3. Left atrial size was moderately dilated.   4. The mitral valve is normal in structure. Severe mitral valve  regurgitation. No evidence of mitral stenosis.   5. The aortic valve is tricuspid. Aortic valve regurgitation is trivial.  Mild aortic valve sclerosis  is present, with no evidence of aortic valve stenosis.   6. The inferior vena cava is normal in size with greater than 50%  respiratory variability, suggesting right atrial pressure of 3 mmHg.   Laboratory Data:  High Sensitivity Troponin:   Recent Labs  Lab 10/12/20 1224 10/12/20 1424 10/13/20 0237 10/13/20 0524  TROPONINIHS 156* 164* 154* 193*     Chemistry Recent Labs  Lab 10/12/20 1224 10/13/20 0237  NA 139 136  K 3.8 3.7  CL 109 107  CO2 22 21*  GLUCOSE 96 103*  BUN 12 13  CREATININE 1.51* 1.64*  CALCIUM 9.0 8.6*  GFRNONAA 55* 50*  ANIONGAP 8 8    Recent Labs  Lab 10/12/20 1252 10/13/20 0237  PROT 7.3 6.2*  ALBUMIN 3.3* 2.8*  AST 30 25  ALT 19 16  ALKPHOS 73 63  BILITOT 1.1 1.1   Hematology Recent Labs  Lab 10/12/20 1224 10/13/20 0237  WBC 4.7 5.0  RBC 4.46 4.04*  HGB 12.2* 11.2*  HCT 38.8* 34.6*  MCV 87.0 85.6  MCH 27.4 27.7  MCHC 31.4 32.4  RDW 15.2 15.1  PLT 343 211   BNP Recent Labs  Lab 10/12/20 1253  BNP 558.3*    DDimer  Recent Labs  Lab 10/12/20 1252  DDIMER 1.68*     Radiology/Studies:  DG Chest 2 View  Result Date: 10/12/2020 CLINICAL DATA:  Chest pain and shortness of breath. EXAM: CHEST - 2 VIEW COMPARISON:  Chest x-ray dated July 20, 2019. FINDINGS: The heart size and mediastinal contours are within normal limits. Patchy interstitial and hazy airspace opacities in both lungs. No pleural effusion or pneumothorax. No acute osseous abnormality. IMPRESSION: 1. Bilateral patchy interstitial and hazy airspace opacities, concerning for multifocal pneumonia, less likely pulmonary edema. Electronically Signed   By: Obie Dredge M.D.   On: 10/12/2020 14:54   CT Angio Chest PE W and/or Wo Contrast  Result Date: 10/12/2020 CLINICAL DATA:  Suspected pulmonary embolus. EXAM: CT ANGIOGRAPHY CHEST WITH CONTRAST TECHNIQUE: Multidetector CT imaging of the chest was performed using the standard protocol during  bolus administration of  intravenous contrast. Multiplanar CT image reconstructions and MIPs were obtained to evaluate the vascular anatomy. CONTRAST:  10mL OMNIPAQUE IOHEXOL 350 MG/ML SOLN COMPARISON:  Jun 30, 2019 FINDINGS: Cardiovascular: Satisfactory opacification of the pulmonary arteries to the segmental level. No evidence of pulmonary embolism. Normal heart size. No pericardial effusion. Mediastinum/Nodes: Conglomerates of bulky abnormal lymph nodes are seen in right pretracheal, left prevascular, bilateral hilar and infra carinal regions. These measure up to 5 cm in diameter. Lungs/Pleura: Bilateral pleural effusions, small on the left and moderate on the right. Patchy ground-glass opacities with central predominance are seen in both lungs, disproportionately affecting the right upper lobe. Upper Abdomen: No acute abnormality. Musculoskeletal: No chest wall abnormality. No acute or significant osseous findings. Review of the MIP images confirms the above findings. IMPRESSION: 1. No evidence of pulmonary embolism. 2. Multifocal bulky mediastinal and hilar lymphadenopathy. These may represent lymphoproliferative disorder, malignant or reactive lymphadenopathy. 3. Bilateral pleural effusions, small on the left and moderate on the right. 4. Patchy ground-glass opacities with central predominance in both lungs, disproportionately affecting the right upper lobe. This may represent infectious/inflammatory pneumonitis or asymmetric mixed pattern pulmonary edema. Electronically Signed   By: Ted Mcalpine M.D.   On: 10/12/2020 15:52   ECHOCARDIOGRAM COMPLETE  Result Date: 10/13/2020    ECHOCARDIOGRAM REPORT   Patient Name:   Nathan [name] Date of Exam: 10/13/2020 Medical Rec #:  [id number]       Height:       70.0 in Accession #:    [id number]      Weight:       223.1 lb Date of Birth:  1965/05/05        BSA:          2.187 m Patient Age:    55 years        BP:           115/88 mmHg Patient Gender: M               HR:           116  bpm. Exam Location:  Inpatient Procedure: 2D Echo, Cardiac Doppler and Color Doppler Indications:    Dyspnea R06.00  History:        Patient has no prior history of Echocardiogram examinations.  Sonographer:    Eulah Pont RDCS Referring Phys: [id number] [facility] [place]OK[place]REL IMPRESSIONS  1. Left ventricular ejection fraction, by estimation, is 30 to 35%. The left ventricle has moderately decreased function. The left ventricle demonstrates global hypokinesis. The left ventricular internal cavity size was severely dilated. Left ventricular diastolic parameters are indeterminate.  2. Right ventricular systolic function is normal. The right ventricular size is normal. There is moderately elevated pulmonary artery systolic pressure.  3. Left atrial size was moderately dilated.  4. The mitral valve is normal in structure. Severe mitral valve regurgitation. No evidence of mitral stenosis.  5. The aortic valve is tricuspid. Aortic valve regurgitation is trivial. Mild aortic valve sclerosis is present, with no evidence of aortic valve stenosis.  6. The inferior vena cava is normal in size with greater than 50% respiratory variability, suggesting right atrial pressure of 3 mmHg. FINDINGS  Left Ventricle: Left ventricular ejection fraction, by estimation, is 30 to 35%. The left ventricle has moderately decreased function. The left ventricle demonstrates global hypokinesis. The left ventricular internal cavity size was severely dilated. There is no left ventricular hypertrophy. Left ventricular diastolic parameters are indeterminate. Right Ventricle: The right  ventricular size is normal. Right ventricular systolic function is normal. There is moderately elevated pulmonary artery systolic pressure. The tricuspid regurgitant velocity is 3.48 m/s, and with an assumed right atrial pressure of 3 mmHg, the estimated right ventricular systolic pressure is 51.4 mmHg. Left Atrium: Left atrial size was moderately dilated. Right Atrium: Right  atrial size was normal in size. Pericardium: Trivial pericardial effusion is present. Mitral Valve: The mitral valve is normal in structure. Severe mitral valve regurgitation. No evidence of mitral valve stenosis. Tricuspid Valve: The tricuspid valve is normal in structure. Tricuspid valve regurgitation is mild . No evidence of tricuspid stenosis. Aortic Valve: The aortic valve is tricuspid. Aortic valve regurgitation is trivial. Mild aortic valve sclerosis is present, with no evidence of aortic valve stenosis. Pulmonic Valve: The pulmonic valve was normal in structure. Pulmonic valve regurgitation is mild. No evidence of pulmonic stenosis. Aorta: The aortic root is normal in size and structure. Venous: The inferior vena cava is normal in size with greater than 50% respiratory variability, suggesting right atrial pressure of 3 mmHg. IAS/Shunts: No atrial level shunt detected by color flow Doppler.  LEFT VENTRICLE PLAX 2D LVIDd:         6.70 cm LVIDs:         5.50 cm LV PW:         1.20 cm LV IVS:        0.70 cm LVOT diam:     2.10 cm LV SV:         43 LV SV Index:   20 LVOT Area:     3.46 cm  LV Volumes (MOD) LV vol d, MOD A2C: 163.0 ml LV vol d, MOD A4C: 206.0 ml LV vol s, MOD A2C: 122.0 ml LV vol s, MOD A4C: 130.0 ml LV SV MOD A2C:     41.0 ml LV SV MOD A4C:     206.0 ml LV SV MOD BP:      57.8 ml RIGHT VENTRICLE TAPSE (M-mode): 1.6 cm LEFT ATRIUM             Index       RIGHT ATRIUM           Index LA diam:        4.80 cm 2.20 cm/m  RA Area:     13.10 cm LA Vol (A2C):   82.5 ml 37.73 ml/m RA Volume:   33.00 ml  15.09 ml/m LA Vol (A4C):   83.0 ml 37.96 ml/m LA Biplane Vol: 91.2 ml 41.71 ml/m  AORTIC VALVE LVOT Vmax:   99.45 cm/s LVOT Vmean:  65.500 cm/s LVOT VTI:    0.125 m  AORTA Ao Root diam: 2.90 cm Ao Asc diam:  3.20 cm MR Peak grad:    80.6 mmHg   TRICUSPID VALVE MR Mean grad:    52.0 mmHg   TR Peak grad:   48.4 mmHg MR Vmax:         449.00 cm/s TR Vmax:        348.00 cm/s MR Vmean:        336.0 cm/s  MR PISA:         1.57 cm    SHUNTS MR PISA Eff ROA: 12 mm      Systemic VTI:  0.12 m MR PISA Radius:  0.50 cm     Systemic Diam: 2.10 cm Olga MillersBrian Crenshaw MD Electronically signed by Olga MillersBrian Crenshaw MD Signature Date/Time: 10/13/2020/1:23:17 PM    Final  Assessment and Plan:   Chest pain,  associated w/ LVD on echo, but no WMA - sx typical for CHF, have been going on for about a month - with sarcoid a real possibility, need stress MRI to eval for ischemia and inflammatory issues  2. peripheral focal chorioretinal inflammation of both eyes and retinal vasculitis of both eyes - has been on steroids in the past, more recently on methotrexate - he quit taking the MTX sometime in the last year - last eye MD appt was 02/07/2019   Risk Assessment/Risk Scores:     HEAR Score (for undifferentiated chest pain):  HEAR Score: 3  New York Heart Association (NYHA) Functional Class NYHA Class IV        For questions or updates, please contact [facility] Please consult [email or web address] for contact info under    Signed, Theodore Demark, PA-C  10/13/2020 2:06 PM

## 2020-10-13 NOTE — Progress Notes (Signed)
   10/13/20 1804  Assess: MEWS Score  Temp 98.8 F (37.1 C)  BP 119/87  Pulse Rate (!) 129  Level of Consciousness Alert  SpO2 97 %  O2 Device Nasal Cannula  Assess: MEWS Score  MEWS Temp 0  MEWS Systolic 0  MEWS Pulse 2  MEWS RR 0  MEWS LOC 0  MEWS Score 2  MEWS Score Color Yellow  Assess: if the MEWS score is Yellow or Red  Were vital signs taken at a resting state? Yes  Focused Assessment No change from prior assessment  Early Detection of Sepsis Score *See Row Information* Low  MEWS guidelines implemented *See Row Information* No, previously yellow, continue vital signs every 4 hours  Treat  Pain Scale 0-10  Pain Score 0    Patient has tachycardia 130`s also complains of SOB. Vitals done and MD made aware. Ordered to hold next dose of albuterol.

## 2020-10-14 ENCOUNTER — Inpatient Hospital Stay (HOSPITAL_COMMUNITY): Payer: 59

## 2020-10-14 DIAGNOSIS — R0602 Shortness of breath: Secondary | ICD-10-CM | POA: Diagnosis not present

## 2020-10-14 DIAGNOSIS — I5041 Acute combined systolic (congestive) and diastolic (congestive) heart failure: Secondary | ICD-10-CM | POA: Diagnosis not present

## 2020-10-14 DIAGNOSIS — I340 Nonrheumatic mitral (valve) insufficiency: Secondary | ICD-10-CM | POA: Diagnosis not present

## 2020-10-14 DIAGNOSIS — R079 Chest pain, unspecified: Secondary | ICD-10-CM | POA: Diagnosis not present

## 2020-10-14 DIAGNOSIS — R0600 Dyspnea, unspecified: Secondary | ICD-10-CM | POA: Diagnosis not present

## 2020-10-14 LAB — CBC
HCT: 34.9 % — ABNORMAL LOW (ref 39.0–52.0)
Hemoglobin: 11.1 g/dL — ABNORMAL LOW (ref 13.0–17.0)
MCH: 27.3 pg (ref 26.0–34.0)
MCHC: 31.8 g/dL (ref 30.0–36.0)
MCV: 85.7 fL (ref 80.0–100.0)
Platelets: 216 10*3/uL (ref 150–400)
RBC: 4.07 MIL/uL — ABNORMAL LOW (ref 4.22–5.81)
RDW: 15.2 % (ref 11.5–15.5)
WBC: 5.9 10*3/uL (ref 4.0–10.5)
nRBC: 0 % (ref 0.0–0.2)

## 2020-10-14 LAB — BASIC METABOLIC PANEL
Anion gap: 9 (ref 5–15)
BUN: 14 mg/dL (ref 6–20)
CO2: 21 mmol/L — ABNORMAL LOW (ref 22–32)
Calcium: 8.7 mg/dL — ABNORMAL LOW (ref 8.9–10.3)
Chloride: 106 mmol/L (ref 98–111)
Creatinine, Ser: 1.55 mg/dL — ABNORMAL HIGH (ref 0.61–1.24)
GFR, Estimated: 53 mL/min — ABNORMAL LOW (ref >60)
Glucose, Bld: 102 mg/dL — ABNORMAL HIGH (ref 70–99)
Potassium: 3.7 mmol/L (ref 3.5–5.1)
Sodium: 136 mmol/L (ref 135–145)

## 2020-10-14 LAB — TROPONIN I (HIGH SENSITIVITY)
Troponin I (High Sensitivity): 129 ng/L (ref <18)
Troponin I (High Sensitivity): 120 ng/L (ref <18)

## 2020-10-14 LAB — ANGIOTENSIN CONVERTING ENZYME: Angiotensin-Converting Enzyme: 41 U/L (ref 14–82)

## 2020-10-14 LAB — MAGNESIUM: Magnesium: 1.8 mg/dL (ref 1.7–2.4)

## 2020-10-14 MED ORDER — AZITHROMYCIN 500 MG PO TABS
500.0000 mg | ORAL_TABLET | Freq: Every day | ORAL | Status: AC
  Administered 2020-10-14 – 2020-10-17 (×4): 500 mg via ORAL
  Filled 2020-10-14 (×4): qty 1

## 2020-10-14 MED ORDER — MORPHINE SULFATE (PF) 2 MG/ML IV SOLN
2.0000 mg | INTRAVENOUS | Status: DC | PRN
  Administered 2020-10-14: 2 mg via INTRAVENOUS
  Filled 2020-10-14: qty 1

## 2020-10-14 MED ORDER — NITROGLYCERIN 0.4 MG SL SUBL
0.4000 mg | SUBLINGUAL_TABLET | SUBLINGUAL | Status: DC | PRN
  Administered 2020-10-14 – 2020-10-15 (×4): 0.4 mg via SUBLINGUAL
  Filled 2020-10-14: qty 1

## 2020-10-14 MED ORDER — ADULT MULTIVITAMIN W/MINERALS CH
1.0000 | ORAL_TABLET | Freq: Every day | ORAL | Status: DC
  Administered 2020-10-14 – 2020-10-22 (×8): 1 via ORAL
  Filled 2020-10-14 (×8): qty 1

## 2020-10-14 MED ORDER — SODIUM CHLORIDE 0.9% FLUSH
3.0000 mL | Freq: Two times a day (BID) | INTRAVENOUS | Status: DC
  Administered 2020-10-14 – 2020-10-22 (×12): 3 mL via INTRAVENOUS

## 2020-10-14 NOTE — Progress Notes (Signed)
Initial Nutrition Assessment  DOCUMENTATION CODES:   Obesity unspecified  INTERVENTION:  -continue Ensure Enlive po BID, each supplement provides 350 kcal and 20 grams of protein -MVI with minerals daily  NUTRITION DIAGNOSIS:   Inadequate oral intake related to poor appetite as evidenced by meal completion < 50%.  GOAL:   Patient will meet greater than or equal to 90% of their needs  MONITOR:   PO intake, Supplement acceptance, Labs, Weight trends, I & O's  REASON FOR ASSESSMENT:   Malnutrition Screening Tool    ASSESSMENT:   Pt with PMH significant for GERD and BPH admitted with dyspnea, chest pain, and shortness of breath with bilateral pleural effusion.  CXR w/ multifocal PNA.   Pt unavailable at time of RD visit x2 attempts. Discussed pt with RN who reports pt with poor po intake. 25% meal completion x1 recorded meal. Pt with orders for Ensure Enlive/Plus BID and doing well with supplement thus far per RN.   Medications: colace, protonix Labs: Cr 1.55 (H), Troponin I 120 (H)  UOP: x24 hours I/O: - since admit  NUTRITION - FOCUSED PHYSICAL EXAM: Unable to perform at this time. Will attempt at follow-up.   Diet Order:   Diet Order             Diet Heart Room service appropriate? Yes; Fluid consistency: Thin  Diet effective now                   EDUCATION NEEDS:   No education needs have been identified at this time  Skin:  Skin Assessment: Reviewed RN Assessment  Last BM:  9/11  Height:   Ht Readings from Last 1 Encounters:  10/12/20 5\' 10"  (1.778 m)    Weight:   Wt Readings from Last 1 Encounters:  10/14/20 101.9 kg     BMI:  Body mass index is 32.23 kg/m.  Estimated Nutritional Needs:   Kcal:  2000-2200  Protein:  125-150 gram  Fluid:  >2L    12/14/20, MS, RD, LDN (she/her/hers) RD pager number and weekend/on-call pager number located in Amion.

## 2020-10-14 NOTE — Progress Notes (Addendum)
NAME:  Nathan [name], MRN:  [id number], DOB:  February 13, 1965, LOS: 2 ADMISSION DATE:  10/12/2020, CONSULTATION DATE:  10/12/2020 REFERRING MD:  Nathan Das MD, CHIEF COMPLAINT:  Abnormal CT scan   History of Present Illness:  55 Y/O with remote smoking history, peripheral focal chorioretinal inflammation eyes  Admitted with dyspnea, chest discomfort.  CT scan on admission significant for small bilateral pleural effusion, mediastinal lymphadenopathy  Pertinent  Medical History    has a past medical history of Allergies, BPH (benign prostatic hyperplasia), ED (erectile dysfunction), GERD (gastroesophageal reflux disease), Peripheral focal chorioretinal inflammation of both eyes, and Retinal vasculitis of both eyes.  He has history of peripheral focal chorioretinal inflammation of both eyes diagnosed in 2020.  Followed at [name].  He was treated with methotrexate and folic acid but self discontinued after 6 months in 2021.  He has not followed up with his ophthalmologist.  Remote smoker in his 20 to 30s  Has a [employer] company.  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  No pets  Significant Hospital Events: Including procedures, antibiotic start and stop dates in addition to other pertinent events   9/10 admit  Interim History / Subjective:   Complains of dyspnea.  Unchanged since yesterday.  Objective   Blood pressure 120/89, pulse (!) 113, temperature 98.1 F (36.7 C), temperature source Oral, resp. rate (!) 25, height 5\' 10"  (1.778 m), weight 101.9 kg, SpO2 98 %.        Intake/Output Summary (Last 24 hours) at 10/14/2020 0753 Last data filed at 10/13/2020 2100 Gross per 24 hour  Intake 686.6 ml  Output 400 ml  Net 286.6 ml   Filed Weights   10/12/20 1246 10/13/20 0500 10/14/20 0500  Weight: 102.5 kg 101.2 kg 101.9 kg    Examination: Gen:      No acute distress HEENT:  EOMI, sclera anicteric Neck:     No masses; no  thyromegaly Lungs:    Clear to auscultation bilaterally; normal respiratory effort CV:         Regular rate and rhythm; no murmurs Abd:      + bowel sounds; soft, non-tender; no palpable masses, no distension Ext:    No edema; adequate peripheral perfusion Skin:      Warm and dry; no rash Neuro: alert and oriented x 3 Psych: normal mood and affect   Lab/imaging personally reviewed CT significant for bulky mediastinal, bilateral hilar lymphadenopathy, groundglass opacity bilaterally with bilateral pleural effusion.  Creatinine slightly improved to 1.55, troponin remains elevated at 129 Chest x-ray with improved effusion.  Continues to have bilateral pulmonary infiltrates suggestive of edema/inflammation  Resolved Hospital Problem list     Assessment & Plan:  Bilateral mediastinal and hilar lymphadenopathy Suspect either sarcoidosis or lymphoproliferative disorder.  Sarcoidosis is more likely given history of chorioretinal inflammation in the past.  He may have a community-acquired pneumonia but the size of the lymph nodes is out of proportion.  Continue ceftriaxone, azithromycin Supplemental oxygen ACE level is pending Lasix as tolerated by renal function  Echo shows new cardiomyopathy and may need an ischemia eval. He continues to have persistent tachycardia. It is is not safe to do a bronch, biopsy under anesthesia so we will hold off. If Cardiac MRI (as suggested by cardiology) or ACE level is suggestive of sarcoid then we would recommend empiric steroids  Elevated troponin, BNP Bilateral effusions > improved on chest x ray New diagnosis of cardiomyopathy Effusions  may be related to heart failure.   Appreciate cardiology evaluation May need cardiac stress MRI, heart catheterization for ischemia evaluation renal function allows  [name] updated today at bedside  Best Practice (right click and "Reselect all SmartList Selections" daily)   Diet/type: Regular consistency (see  orders) DVT prophylaxis: not indicated GI prophylaxis: N/A Lines: N/A Foley:  N/A Code Status:  full code Last date of multidisciplinary goals of care discussion []   Signature:   MD [name] See Amion for pager  If no response to pager , please call [phone] until 7pm After 7:00 pm call Elink  [phone] 10/14/2020, 7:53 AM

## 2020-10-14 NOTE — Progress Notes (Signed)
Patient c/o chest pain and dyspneic at 5 am, checked VTS, done EKG, gand given Nitro and morphine. Then, verbalized of feeling relief of Chest pain and will continue to monitor closely.

## 2020-10-14 NOTE — Progress Notes (Addendum)
Patient ID: Nathan [name], male   DOB: 1966/01/01, 55 y.o.   MRN: [id number]   Primary Cardiologist:  Duke Salvia  Subjective:  Still with dyspnea would not be able to lay flat for cath or MRI Discussed options with patient and wife who is nurse on 5 W  Objective:  Vitals:   10/14/20 0050 10/14/20 0459 10/14/20 0500 10/14/20 0807  BP:  120/89  120/86  Pulse: (!) 129 (!) 113  (!) 111  Resp: (!) 25 (!) 25    Temp:  98.1 F (36.7 C)  98.4 F (36.9 C)  TempSrc:  Oral  Oral  SpO2: 97% 98%  99%  Weight:   101.9 kg   Height:        Intake/Output from previous day:  Intake/Output Summary (Last 24 hours) at 10/14/2020 9024 Last data filed at 10/13/2020 2100 Gross per 24 hour  Intake 686.6 ml  Output 400 ml  Net 286.6 ml    Physical Exam: Affect appropriate Healthy:  appears stated age HEENT: normal Neck supple with no adenopathy JVP normal no bruits no thyromegaly Lungs clear with no wheezing and good diaphragmatic motion Heart:  S1/S2 MR  murmur, no rub, gallop or click PMI normal Abdomen: benighn, BS positve, no tenderness, no AAA no bruit.  No HSM or HJR Distal pulses intact with no bruits No edema Neuro non-focal Skin warm and dry No muscular weakness   Lab Results: Basic Metabolic Panel: Recent Labs    10/13/20 0237 10/14/20 0327  NA 136 136  K 3.7 3.7  CL 107 106  CO2 21* 21*  GLUCOSE 103* 102*  BUN 13 14  CREATININE 1.64* 1.55*  CALCIUM 8.6* 8.7*  MG 1.8 1.8  PHOS 3.7  --    Liver Function Tests: Recent Labs    10/12/20 1252 10/13/20 0237  AST 30 25  ALT 19 16  ALKPHOS 73 63  BILITOT 1.1 1.1  PROT 7.3 6.2*  ALBUMIN 3.3* 2.8*   Recent Labs    10/12/20 1252  LIPASE 28   CBC: Recent Labs    10/13/20 0237 10/14/20 0327  WBC 5.0 5.9  HGB 11.2* 11.1*  HCT 34.6* 34.9*  MCV 85.6 85.7  PLT 211 216   Cardiac Enzymes: No results for input(s): CKTOTAL, CKMB, CKMBINDEX, TROPONINI in the last 72 hours. BNP: Invalid input(s):  POCBNP D-Dimer: Recent Labs    10/12/20 1252  DDIMER 1.68*   Hemoglobin A1C: Recent Labs    10/13/20 0237  HGBA1C 4.9   Fasting Lipid Panel: No results for input(s): CHOL, HDL, LDLCALC, TRIG, CHOLHDL, LDLDIRECT in the last 72 hours. Thyroid Function Tests: No results for input(s): TSH, T4TOTAL, T3FREE, THYROIDAB in the last 72 hours.  Invalid input(s): FREET3 Anemia Panel: No results for input(s): VITAMINB12, FOLATE, FERRITIN, TIBC, IRON, RETICCTPCT in the last 72 hours.  Imaging: DG Chest 2 View  Result Date: 10/12/2020 CLINICAL DATA:  Chest pain and shortness of breath. EXAM: CHEST - 2 VIEW COMPARISON:  Chest x-ray dated July 20, 2019. FINDINGS: The heart size and mediastinal contours are within normal limits. Patchy interstitial and hazy airspace opacities in both lungs. No pleural effusion or pneumothorax. No acute osseous abnormality. IMPRESSION: 1. Bilateral patchy interstitial and hazy airspace opacities, concerning for multifocal pneumonia, less likely pulmonary edema. Electronically Signed   By: Obie Dredge M.D.   On: 10/12/2020 14:54   CT Angio Chest PE W and/or Wo Contrast  Result Date: 10/12/2020 CLINICAL DATA:  Suspected pulmonary embolus. EXAM: CT  ANGIOGRAPHY CHEST WITH CONTRAST TECHNIQUE: Multidetector CT imaging of the chest was performed using the standard protocol during bolus administration of intravenous contrast. Multiplanar CT image reconstructions and MIPs were obtained to evaluate the vascular anatomy. CONTRAST:  56mL OMNIPAQUE IOHEXOL 350 MG/ML SOLN COMPARISON:  Jun 30, 2019 FINDINGS: Cardiovascular: Satisfactory opacification of the pulmonary arteries to the segmental level. No evidence of pulmonary embolism. Normal heart size. No pericardial effusion. Mediastinum/Nodes: Conglomerates of bulky abnormal lymph nodes are seen in right pretracheal, left prevascular, bilateral hilar and infra carinal regions. These measure up to 5 cm in diameter. Lungs/Pleura:  Bilateral pleural effusions, small on the left and moderate on the right. Patchy ground-glass opacities with central predominance are seen in both lungs, disproportionately affecting the right upper lobe. Upper Abdomen: No acute abnormality. Musculoskeletal: No chest wall abnormality. No acute or significant osseous findings. Review of the MIP images confirms the above findings. IMPRESSION: 1. No evidence of pulmonary embolism. 2. Multifocal bulky mediastinal and hilar lymphadenopathy. These may represent lymphoproliferative disorder, malignant or reactive lymphadenopathy. 3. Bilateral pleural effusions, small on the left and moderate on the right. 4. Patchy ground-glass opacities with central predominance in both lungs, disproportionately affecting the right upper lobe. This may represent infectious/inflammatory pneumonitis or asymmetric mixed pattern pulmonary edema. Electronically Signed   By: Ted Mcalpine M.D.   On: 10/12/2020 15:52   ECHOCARDIOGRAM COMPLETE  Result Date: 10/13/2020    ECHOCARDIOGRAM REPORT   Patient Name:   Nathan [name] Date of Exam: 10/13/2020 Medical Rec #:  [id number]       Height:       70.0 in Accession #:    [id number]      Weight:       223.1 lb Date of Birth:  04-Mar-1965        BSA:          2.187 m Patient Age:    55 years        BP:           115/88 mmHg Patient Gender: M               HR:           116 bpm. Exam Location:  Inpatient Procedure: 2D Echo, Cardiac Doppler and Color Doppler Indications:    Dyspnea R06.00  History:        Patient has no prior history of Echocardiogram examinations.  Sonographer:    Eulah Pont RDCS Referring Phys: [id number] [facility] [place]OKHREL IMPRESSIONS  1. Left ventricular ejection fraction, by estimation, is 30 to 35%. The left ventricle has moderately decreased function. The left ventricle demonstrates global hypokinesis. The left ventricular internal cavity size was severely dilated. Left ventricular diastolic parameters are indeterminate.  2.  Right ventricular systolic function is normal. The right ventricular size is normal. There is moderately elevated pulmonary artery systolic pressure.  3. Left atrial size was moderately dilated.  4. The mitral valve is normal in structure. Severe mitral valve regurgitation. No evidence of mitral stenosis.  5. The aortic valve is tricuspid. Aortic valve regurgitation is trivial. Mild aortic valve sclerosis is present, with no evidence of aortic valve stenosis.  6. The inferior vena cava is normal in size with greater than 50% respiratory variability, suggesting right atrial pressure of 3 mmHg. FINDINGS  Left Ventricle: Left ventricular ejection fraction, by estimation, is 30 to 35%. The left ventricle has moderately decreased function. The left ventricle demonstrates global hypokinesis. The left ventricular internal cavity size was  severely dilated. There is no left ventricular hypertrophy. Left ventricular diastolic parameters are indeterminate. Right Ventricle: The right ventricular size is normal. Right ventricular systolic function is normal. There is moderately elevated pulmonary artery systolic pressure. The tricuspid regurgitant velocity is 3.48 m/s, and with an assumed right atrial pressure of 3 mmHg, the estimated right ventricular systolic pressure is 51.4 mmHg. Left Atrium: Left atrial size was moderately dilated. Right Atrium: Right atrial size was normal in size. Pericardium: Trivial pericardial effusion is present. Mitral Valve: The mitral valve is normal in structure. Severe mitral valve regurgitation. No evidence of mitral valve stenosis. Tricuspid Valve: The tricuspid valve is normal in structure. Tricuspid valve regurgitation is mild . No evidence of tricuspid stenosis. Aortic Valve: The aortic valve is tricuspid. Aortic valve regurgitation is trivial. Mild aortic valve sclerosis is present, with no evidence of aortic valve stenosis. Pulmonic Valve: The pulmonic valve was normal in structure.  Pulmonic valve regurgitation is mild. No evidence of pulmonic stenosis. Aorta: The aortic root is normal in size and structure. Venous: The inferior vena cava is normal in size with greater than 50% respiratory variability, suggesting right atrial pressure of 3 mmHg. IAS/Shunts: No atrial level shunt detected by color flow Doppler.  LEFT VENTRICLE PLAX 2D LVIDd:         6.70 cm LVIDs:         5.50 cm LV PW:         1.20 cm LV IVS:        0.70 cm LVOT diam:     2.10 cm LV SV:         43 LV SV Index:   20 LVOT Area:     3.46 cm  LV Volumes (MOD) LV vol d, MOD A2C: 163.0 ml LV vol d, MOD A4C: 206.0 ml LV vol s, MOD A2C: 122.0 ml LV vol s, MOD A4C: 130.0 ml LV SV MOD A2C:     41.0 ml LV SV MOD A4C:     206.0 ml LV SV MOD BP:      57.8 ml RIGHT VENTRICLE TAPSE (M-mode): 1.6 cm LEFT ATRIUM             Index       RIGHT ATRIUM           Index LA diam:        4.80 cm 2.20 cm/m  RA Area:     13.10 cm LA Vol (A2C):   82.5 ml 37.73 ml/m RA Volume:   33.00 ml  15.09 ml/m LA Vol (A4C):   83.0 ml 37.96 ml/m LA Biplane Vol: 91.2 ml 41.71 ml/m  AORTIC VALVE LVOT Vmax:   99.45 cm/s LVOT Vmean:  65.500 cm/s LVOT VTI:    0.125 m  AORTA Ao Root diam: 2.90 cm Ao Asc diam:  3.20 cm MR Peak grad:    80.6 mmHg   TRICUSPID VALVE MR Mean grad:    52.0 mmHg   TR Peak grad:   48.4 mmHg MR Vmax:         449.00 cm/s TR Vmax:        348.00 cm/s MR Vmean:        336.0 cm/s MR PISA:         1.57 cm    SHUNTS MR PISA Eff ROA: 12 mm      Systemic VTI:  0.12 m MR PISA Radius:  0.50 cm     Systemic Diam: 2.10 cm Olga Millers MD Electronically  signed by Olga Millers MD Signature Date/Time: 10/13/2020/1:23:17 PM    Final     Cardiac Studies:  ECG: ST with PVC 10/14/2020    Telemetry:  Echo: EF 30-35% severe MR   Medications:    aspirin EC  81 mg Oral Daily   docusate sodium  100 mg Oral BID   enoxaparin (LOVENOX) injection  40 mg Subcutaneous Q24H   feeding supplement  237 mL Oral BID BM   influenza vac split quadrivalent PF  0.5  mL Intramuscular Tomorrow-1000   loratadine  10 mg Oral Daily   montelukast  10 mg Oral Daily   pantoprazole  40 mg Oral Daily   sodium chloride flush  3 mL Intravenous Q12H   tamsulosin  0.8 mg Oral QODAY   tamsulosin  0.8 mg Oral Daily      sodium chloride     azithromycin 500 mg (10/13/20 1603)   cefTRIAXone (ROCEPHIN)  IV     cefTRIAXone (ROCEPHIN)  IV 2 g (10/13/20 1504)    Assessment/Plan:   CHF: EF 30-35% global hypokinesis with severe MR and very broad based jet Functional MR/secondary Given degree of LV dysfunction and MR don't feel stress MRI sufficient to assess hemodynamics and r/o CAD. He is too dyspnic to lay flat and certainly could not tolerate long MRI More likely to tolerate right and left cath from arm sitting up a bit. Tentatively arranged right and left cath for Wednesday with Dr Herbie Baltimore and MRI Thursday as morphology only r/o Sarcoid. Orders written and Sarah CT Navigator aware of MRI for Thursday Sarcoid:  Lung disease ? 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 ON antibiotics per pulmonary Not ready for bronch at this time.   Situation a bit tenuous with severe LV dysfunction and severe MR. Will add ARB low dose and Dig no beta blockers with acute CHF and low output Continue diuresis follow CR   Charlton Haws 10/14/2020, 8:42 AM

## 2020-10-14 NOTE — Progress Notes (Addendum)
PROGRESS NOTE  Nathan [name] IPJ:[id number] DOB: 01-14-1966 DOA: 10/12/2020 PCP: Dois Davenport, MD   LOS: 2 days   Brief narrative:  Nathan [name] is a 55 y.o. male with no significant past medical history presented to hospital with 3 to 4 weeks of shortness of breath and chest discomfort for 3 to 4 weeks.    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  Chest x-ray was done which showed some hilar lymphadenopathy.  Was initially noted to be hypoxic with pulse ox of 87% on room air.  Patient was also noted to be tachycardic.  Patient was then transferred to the ED for further evaluation.  In the ED, patient was noted to have elevated BNP of 558.  D- dimer was elevated at 1.6, so CT angiogram of the chest was performed which showed a hilar and midsternal lymphadenopathy, pleural effusion.  COVID test and influenza was negative.  Creatinine was elevated at 1.5.  Chest x-ray showed patchy interstitial and hazy airspace opacities suggestive of multifocal pneumonia.  Patient also received Rocephin and Zithromax and 20 mg of Lasix in the ED.  Patient was then admitted hospital for further evaluation and treatment.   Assessment/Plan:  Principal Problem:   Shortness of breath Active Problems:   Mediastinal lymphadenopathy   Pneumonia   Chest pain   Dyspnea   Acute combined systolic and diastolic heart failure (HCC)   Sarcoidosis   Severe mitral regurgitation  Dyspnea, chest pain and shortness of breath with bilateral pleural effusion.   Chest x-ray with multifocal pneumonia.  Continue with Rocephin and Zithromax for now.  CT scan shows bilateral hilar and mediastinal lymphadenopathy with pleural effusion.  Possibility of lymphoproliferative disorder/sarcoidosis.  HIV pending.  Pulmonary on board.  Continue with nebulizer, supportive care, incentive spirometry.  Cardiac MRI has been requested.  Multifocal  pneumonia, atypical pneumonia.  Continue Rocephin and Zithromax.    Influenza and COVID was negative.   Hilar, midsternal lymphadenopathy. As per the CT angiogram of the chest.  Likely secondary to sarcoidosis or lymphoproliferative disorder.  Pulmonary on board.  Awaiting for cardiac optimization prior to pulmonary intervention.  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  Creatinine of 1.5.  Will continue to monitor.  Elevated troponin with reduced LV function  Likely acute systolic congestive heart failure.   EKG with tachycardia.  Still with tachycardia.  Chest pain for almost 1 month constant.  Atypical chest pain.  2D echocardiogram showed LV ejection fraction of 30 to 35% with global hypokinesis and severely dilated left ventricle.  Moderate elevation of pulmonary artery systolic pressure.  Severe mitral valve regurgitation noted.  Cardiology on board and recommend starting digoxin and ARB.  Avoiding beta-blocker at this time.  Might need cardiac catheterization as per cardiology..   Continue aspirin for now.   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  Was on steroids for 6 months in the past was seen by ophthalmologist in the past.  Obesity.  Present on admission.  Would benefit from weight loss as outpatient.   DVT prophylaxis: enoxaparin (LOVENOX) injection 40 mg Start: 10/12/20 1730   Code Status: Full code  Family Communication: Spoke with the wife at bedside.  Status is: Inpatient  Remains inpatient appropriate because:IV treatments appropriate due to intensity of illness or inability to take PO and Inpatient level of care appropriate due to severity of illness  Dispo:  The patient is from: Home              Anticipated d/c is to: Home              Patient currently is not medically stable to d/c.   Difficult to place patient No  Consultants: Pulmonary Cardiology   Procedures: None  Anti-infectives:  Rocephin and Zithromax  Anti-infectives (From  admission, onward)    Start     Dose/Rate Route Frequency Ordered Stop   10/13/20 1600  cefTRIAXone (ROCEPHIN) 2 g in sodium chloride 0.9 % 100 mL IVPB        2 g 200 mL/hr over 30 Minutes Intravenous Every 24 hours 10/12/20 1737     10/12/20 1745  cefTRIAXone (ROCEPHIN) 1 g in sodium chloride 0.9 % 100 mL IVPB        1 g 200 mL/hr over 30 Minutes Intravenous  Once 10/12/20 1737     10/12/20 1730  cefTRIAXone (ROCEPHIN) 1 g in sodium chloride 0.9 % 100 mL IVPB  Status:  Discontinued        1 g 200 mL/hr over 30 Minutes Intravenous Every 24 hours 10/12/20 1727 10/12/20 1735   10/12/20 1730  azithromycin (ZITHROMAX) 500 mg in sodium chloride 0.9 % 250 mL IVPB        500 mg 250 mL/hr over 60 Minutes Intravenous Every 24 hours 10/12/20 1727     10/12/20 1615  cefTRIAXone (ROCEPHIN) 1 g in sodium chloride 0.9 % 100 mL IVPB        1 g 200 mL/hr over 30 Minutes Intravenous  Once 10/12/20 1608 10/12/20 1730   10/12/20 1615  azithromycin (ZITHROMAX) 500 mg in sodium chloride 0.9 % 250 mL IVPB  Status:  Discontinued        500 mg 250 mL/hr over 60 Minutes Intravenous  Once 10/12/20 1608 10/12/20 1736       Subjective: Today, patient was seen and examined at bedside.  Complains of shortness of breath,chest discomfort and dyspnea on exertion and orthopnea.  Objective: Vitals:   10/14/20 0807 10/14/20 1236  BP: 120/86 104/71  Pulse: (!) 111 (!) 116  Resp:  20  Temp: 98.4 F (36.9 C) 98.3 F (36.8 C)  SpO2: 99% 99%    Intake/Output Summary (Last 24 hours) at 10/14/2020 1349 Last data filed at 10/13/2020 2100 Gross per 24 hour  Intake 566.6 ml  Output --  Net 566.6 ml    Filed Weights   10/12/20 1246 10/13/20 0500 10/14/20 0500  Weight: 102.5 kg 101.2 kg 101.9 kg   Body mass index is 32.23 kg/m.   Physical Exam:  GENERAL: Patient is alert awake and oriented. Not in obvious distress. On nasal canula oxygen.  Obese HENT: No scleral pallor or icterus. Pupils equally reactive to  light. Oral mucosa is moist NECK: is supple, no gross swelling noted. CHEST: Diminished breath sounds bilaterally.  Coarse breath sounds noted CVS: S1 and S2 heard, systolic murmur, tachycardia ABDOMEN: Soft, non-tender, bowel sounds are present. EXTREMITIES: No edema. CNS: Cranial nerves are intact. No focal motor deficits. SKIN: warm and dry without rashes.  Data Review: I have personally reviewed the following laboratory data and studies,  CBC: Recent Labs  Lab 10/12/20 1224 10/13/20 0237 10/14/20 0327  WBC 4.7 5.0 5.9  HGB 12.2* 11.2* 11.1*  HCT 38.8* 34.6* 34.9*  MCV 87.0 85.6 85.7  PLT 343 211 216    Basic Metabolic Panel: Recent Labs  Lab 10/12/20 1224 10/13/20  1540 10/14/20 0327  NA 139 136 136  K 3.8 3.7 3.7  CL 109 107 106  CO2 22 21* 21*  GLUCOSE 96 103* 102*  BUN 12 13 14   CREATININE 1.51* 1.64* 1.55*  CALCIUM 9.0 8.6* 8.7*  MG  --  1.8 1.8  PHOS  --  3.7  --     Liver Function Tests: Recent Labs  Lab 10/12/20 1252 10/13/20 0237  AST 30 25  ALT 19 16  ALKPHOS 73 63  BILITOT 1.1 1.1  PROT 7.3 6.2*  ALBUMIN 3.3* 2.8*    Recent Labs  Lab 10/12/20 1252  LIPASE 28    No results for input(s): AMMONIA in the last 168 hours. Cardiac Enzymes: No results for input(s): CKTOTAL, CKMB, CKMBINDEX, TROPONINI in the last 168 hours. BNP (last 3 results) Recent Labs    10/12/20 1253  BNP 558.3*     ProBNP (last 3 results) No results for input(s): PROBNP in the last 8760 hours.  CBG: No results for input(s): GLUCAP in the last 168 hours. Recent Results (from the past 240 hour(s))  Resp Panel by RT-PCR (Flu A&B, Covid) Nasopharyngeal Swab     Status: None   Collection Time: 10/12/20  1:20 PM   Specimen: Nasopharyngeal Swab; Nasopharyngeal(NP) swabs in vial transport medium  Result Value Ref Range Status   SARS Coronavirus 2 by RT PCR NEGATIVE NEGATIVE Final    Comment: (NOTE) SARS-CoV-2 target nucleic acids are NOT DETECTED.  The  SARS-CoV-2 RNA is generally detectable in upper respiratory specimens during the acute phase of infection. The lowest concentration of SARS-CoV-2 viral copies this assay can detect is 138 copies/mL. A negative result does not preclude SARS-Cov-2 infection and should not be used as the sole basis for treatment or other patient management decisions. A negative result may occur with  improper specimen collection/handling, submission of specimen other than nasopharyngeal swab, presence of viral mutation(s) within the areas targeted by this assay, and inadequate number of viral copies(<138 copies/mL). A negative result must be combined with clinical observations, patient history, and epidemiological information. The expected result is Negative.  Fact Sheet for Patients:  BloggerCourse.com  Fact Sheet for Healthcare Providers:  SeriousBroker.it  This test is no t yet approved or cleared by the [place] FDA and  has been authorized for detection and/or diagnosis of SARS-CoV-2 by FDA under an Emergency Use Authorization (EUA). This EUA will remain  in effect (meaning this test can be used) for the duration of the COVID-19 declaration under Section 564(b)(1) of the Act, 21 U.S.C.section 360bbb-3(b)(1), unless the authorization is terminated  or revoked sooner.       Influenza A by PCR NEGATIVE NEGATIVE Final   Influenza B by PCR NEGATIVE NEGATIVE Final    Comment: (NOTE) The Xpert Xpress SARS-CoV-2/FLU/RSV plus assay is intended as an aid in the diagnosis of influenza from Nasopharyngeal swab specimens and should not be used as a sole basis for treatment. Nasal washings and aspirates are unacceptable for Xpert Xpress SARS-CoV-2/FLU/RSV testing.  Fact Sheet for Patients: BloggerCourse.com  Fact Sheet for Healthcare Providers: SeriousBroker.it  This test is not yet approved or  cleared by the [place] FDA and has been authorized for detection and/or diagnosis of SARS-CoV-2 by FDA under an Emergency Use Authorization (EUA). This EUA will remain in effect (meaning this test can be used) for the duration of the COVID-19 declaration under Section 564(b)(1) of the Act, 21 U.S.C. section 360bbb-3(b)(1), unless the authorization is terminated or revoked.  Performed at [facility], [address] [place], [place] [address]       Studies: DG Chest 2 View  Result Date: 10/14/2020 CLINICAL DATA:  Pleural effusion, mediastinal adenopathy. EXAM: CHEST - 2 VIEW COMPARISON:  10/12/2020 FINDINGS: Heart is normal size. Prominence of the mediastinal and hilar contours compatible with adenopathy as seen on prior CT. Patchy bilateral airspace disease again noted. Small bilateral effusions. IMPRESSION: Patchy bilateral airspace disease with small effusions, similar to prior CT. Mediastinal and bilateral hilar adenopathy, similar to prior study. Electronically Signed   By: Charlett Nose M.D.   On: 10/14/2020 10:09   DG Chest 2 View  Result Date: 10/12/2020 CLINICAL DATA:  Chest pain and shortness of breath. EXAM: CHEST - 2 VIEW COMPARISON:  Chest x-ray dated July 20, 2019. FINDINGS: The heart size and mediastinal contours are within normal limits. Patchy interstitial and hazy airspace opacities in both lungs. No pleural effusion or pneumothorax. No acute osseous abnormality. IMPRESSION: 1. Bilateral patchy interstitial and hazy airspace opacities, concerning for multifocal pneumonia, less likely pulmonary edema. Electronically Signed   By: Obie Dredge M.D.   On: 10/12/2020 14:54   CT Angio Chest PE W and/or Wo Contrast  Result Date: 10/12/2020 CLINICAL DATA:  Suspected pulmonary embolus. EXAM: CT ANGIOGRAPHY CHEST WITH CONTRAST TECHNIQUE: Multidetector CT imaging of the chest was performed using the standard protocol during bolus administration of intravenous contrast.  Multiplanar CT image reconstructions and MIPs were obtained to evaluate the vascular anatomy. CONTRAST:  55mL OMNIPAQUE IOHEXOL 350 MG/ML SOLN COMPARISON:  Jun 30, 2019 FINDINGS: Cardiovascular: Satisfactory opacification of the pulmonary arteries to the segmental level. No evidence of pulmonary embolism. Normal heart size. No pericardial effusion. Mediastinum/Nodes: Conglomerates of bulky abnormal lymph nodes are seen in right pretracheal, left prevascular, bilateral hilar and infra carinal regions. These measure up to 5 cm in diameter. Lungs/Pleura: Bilateral pleural effusions, small on the left and moderate on the right. Patchy ground-glass opacities with central predominance are seen in both lungs, disproportionately affecting the right upper lobe. Upper Abdomen: No acute abnormality. Musculoskeletal: No chest wall abnormality. No acute or significant osseous findings. Review of the MIP images confirms the above findings. IMPRESSION: 1. No evidence of pulmonary embolism. 2. Multifocal bulky mediastinal and hilar lymphadenopathy. These may represent lymphoproliferative disorder, malignant or reactive lymphadenopathy. 3. Bilateral pleural effusions, small on the left and moderate on the right. 4. Patchy ground-glass opacities with central predominance in both lungs, disproportionately affecting the right upper lobe. This may represent infectious/inflammatory pneumonitis or asymmetric mixed pattern pulmonary edema. Electronically Signed   By: Ted Mcalpine M.D.   On: 10/12/2020 15:52   ECHOCARDIOGRAM COMPLETE  Result Date: 10/13/2020    ECHOCARDIOGRAM REPORT   Patient Name:   Nathan [name] Date of Exam: 10/13/2020 Medical Rec #:  [id number]       Height:       70.0 in Accession #:    [id number]      Weight:       223.1 lb Date of Birth:  Jul 16, 1965        BSA:          2.187 m Patient Age:    55 years        BP:           115/88 mmHg Patient Gender: M               HR:           116  bpm. Exam Location:   Inpatient Procedure: 2D Echo, Cardiac Doppler and Color Doppler Indications:    Dyspnea R06.00  History:        Patient has no prior history of Echocardiogram examinations.  Sonographer:    Eulah Pont RDCS Referring Phys: [id number] [facility] Meir IMPRESSIONS  1. Left ventricular ejection fraction, by estimation, is 30 to 35%. The left ventricle has moderately decreased function. The left ventricle demonstrates global hypokinesis. The left ventricular internal cavity size was severely dilated. Left ventricular diastolic parameters are indeterminate.  2. Right ventricular systolic function is normal. The right ventricular size is normal. There is moderately elevated pulmonary artery systolic pressure.  3. Left atrial size was moderately dilated.  4. The mitral valve is normal in structure. Severe mitral valve regurgitation. No evidence of mitral stenosis.  5. The aortic valve is tricuspid. Aortic valve regurgitation is trivial. Mild aortic valve sclerosis is present, with no evidence of aortic valve stenosis.  6. The inferior vena cava is normal in size with greater than 50% respiratory variability, suggesting right atrial pressure of 3 mmHg. FINDINGS  Left Ventricle: Left ventricular ejection fraction, by estimation, is 30 to 35%. The left ventricle has moderately decreased function. The left ventricle demonstrates global hypokinesis. The left ventricular internal cavity size was severely dilated. There is no left ventricular hypertrophy. Left ventricular diastolic parameters are indeterminate. Right Ventricle: The right ventricular size is normal. Right ventricular systolic function is normal. There is moderately elevated pulmonary artery systolic pressure. The tricuspid regurgitant velocity is 3.48 m/s, and with an assumed right atrial pressure of 3 mmHg, the estimated right ventricular systolic pressure is 51.4 mmHg. Left Atrium: Left atrial size was moderately dilated. Right Atrium: Right atrial size was normal  in size. Pericardium: Trivial pericardial effusion is present. Mitral Valve: The mitral valve is normal in structure. Severe mitral valve regurgitation. No evidence of mitral valve stenosis. Tricuspid Valve: The tricuspid valve is normal in structure. Tricuspid valve regurgitation is mild . No evidence of tricuspid stenosis. Aortic Valve: The aortic valve is tricuspid. Aortic valve regurgitation is trivial. Mild aortic valve sclerosis is present, with no evidence of aortic valve stenosis. Pulmonic Valve: The pulmonic valve was normal in structure. Pulmonic valve regurgitation is mild. No evidence of pulmonic stenosis. Aorta: The aortic root is normal in size and structure. Venous: The inferior vena cava is normal in size with greater than 50% respiratory variability, suggesting right atrial pressure of 3 mmHg. IAS/Shunts: No atrial level shunt detected by color flow Doppler.  LEFT VENTRICLE PLAX 2D LVIDd:         6.70 cm LVIDs:         5.50 cm LV PW:         1.20 cm LV IVS:        0.70 cm LVOT diam:     2.10 cm LV SV:         43 LV SV Index:   20 LVOT Area:     3.46 cm  LV Volumes (MOD) LV vol d, MOD A2C: 163.0 ml LV vol d, MOD A4C: 206.0 ml LV vol s, MOD A2C: 122.0 ml LV vol s, MOD A4C: 130.0 ml LV SV MOD A2C:     41.0 ml LV SV MOD A4C:     206.0 ml LV SV MOD BP:      57.8 ml RIGHT VENTRICLE TAPSE (M-mode): 1.6 cm LEFT ATRIUM             Index  RIGHT ATRIUM           Index LA diam:        4.80 cm 2.20 cm/m  RA Area:     13.10 cm LA Vol (A2C):   82.5 ml 37.73 ml/m RA Volume:   33.00 ml  15.09 ml/m LA Vol (A4C):   83.0 ml 37.96 ml/m LA Biplane Vol: 91.2 ml 41.71 ml/m  AORTIC VALVE LVOT Vmax:   99.45 cm/s LVOT Vmean:  65.500 cm/s LVOT VTI:    0.125 m  AORTA Ao Root diam: 2.90 cm Ao Asc diam:  3.20 cm MR Peak grad:    80.6 mmHg   TRICUSPID VALVE MR Mean grad:    52.0 mmHg   TR Peak grad:   48.4 mmHg MR Vmax:         449.00 cm/s TR Vmax:        348.00 cm/s MR Vmean:        336.0 cm/s MR PISA:         1.57  cm    SHUNTS MR PISA Eff ROA: 12 mm      Systemic VTI:  0.12 m MR PISA Radius:  0.50 cm     Systemic Diam: 2.10 cm Olga Millers MD Electronically signed by Olga Millers MD Signature Date/Time: 10/13/2020/1:23:17 PM    Final       Joycelyn Das, MD  [facility] 10/14/2020  If 7PM-7AM, please contact night-coverage

## 2020-10-15 DIAGNOSIS — R0600 Dyspnea, unspecified: Secondary | ICD-10-CM | POA: Diagnosis not present

## 2020-10-15 DIAGNOSIS — J90 Pleural effusion, not elsewhere classified: Secondary | ICD-10-CM | POA: Diagnosis not present

## 2020-10-15 DIAGNOSIS — R0602 Shortness of breath: Secondary | ICD-10-CM | POA: Diagnosis not present

## 2020-10-15 DIAGNOSIS — I340 Nonrheumatic mitral (valve) insufficiency: Secondary | ICD-10-CM | POA: Diagnosis not present

## 2020-10-15 DIAGNOSIS — I5041 Acute combined systolic (congestive) and diastolic (congestive) heart failure: Secondary | ICD-10-CM | POA: Diagnosis not present

## 2020-10-15 DIAGNOSIS — R079 Chest pain, unspecified: Secondary | ICD-10-CM | POA: Diagnosis not present

## 2020-10-15 DIAGNOSIS — R590 Localized enlarged lymph nodes: Secondary | ICD-10-CM | POA: Diagnosis not present

## 2020-10-15 LAB — BASIC METABOLIC PANEL
Anion gap: 9 (ref 5–15)
BUN: 13 mg/dL (ref 6–20)
CO2: 24 mmol/L (ref 22–32)
Calcium: 9.1 mg/dL (ref 8.9–10.3)
Chloride: 101 mmol/L (ref 98–111)
Creatinine, Ser: 1.42 mg/dL — ABNORMAL HIGH (ref 0.61–1.24)
GFR, Estimated: 59 mL/min — ABNORMAL LOW (ref >60)
Glucose, Bld: 102 mg/dL — ABNORMAL HIGH (ref 70–99)
Potassium: 4.1 mmol/L (ref 3.5–5.1)
Sodium: 134 mmol/L — ABNORMAL LOW (ref 135–145)

## 2020-10-15 MED ORDER — PREDNISONE 20 MG PO TABS
40.0000 mg | ORAL_TABLET | Freq: Every day | ORAL | Status: DC
  Administered 2020-10-15 – 2020-10-22 (×8): 40 mg via ORAL
  Filled 2020-10-15 (×8): qty 2

## 2020-10-15 MED ORDER — POTASSIUM CHLORIDE CRYS ER 20 MEQ PO TBCR
40.0000 meq | EXTENDED_RELEASE_TABLET | Freq: Once | ORAL | Status: AC
  Administered 2020-10-15: 40 meq via ORAL
  Filled 2020-10-15: qty 2

## 2020-10-15 MED ORDER — FUROSEMIDE 10 MG/ML IJ SOLN
40.0000 mg | Freq: Two times a day (BID) | INTRAMUSCULAR | Status: DC
  Administered 2020-10-15 – 2020-10-17 (×3): 40 mg via INTRAVENOUS
  Filled 2020-10-15 (×4): qty 4

## 2020-10-15 MED ORDER — OXYCODONE-ACETAMINOPHEN 5-325 MG PO TABS: 1.0000 | ORAL_TABLET | Freq: Four times a day (QID) | ORAL | Status: DC | PRN

## 2020-10-15 MED ORDER — POTASSIUM CHLORIDE CRYS ER 20 MEQ PO TBCR
20.0000 meq | EXTENDED_RELEASE_TABLET | Freq: Two times a day (BID) | ORAL | Status: DC
  Administered 2020-10-15 – 2020-10-18 (×5): 20 meq via ORAL
  Filled 2020-10-15 (×6): qty 1

## 2020-10-15 NOTE — Progress Notes (Signed)
NAME:  Nathan [name], MRN:  [id number], DOB:  08-06-1965, LOS: 3 ADMISSION DATE:  10/12/2020, CONSULTATION DATE:  10/12/2020 REFERRING MD:  Joycelyn Das MD, CHIEF COMPLAINT:  Abnormal CT scan   History of Present Illness:  56 Y/O with remote smoking history, peripheral focal chorioretinal inflammation eyes  Admitted with dyspnea, chest discomfort.  CT scan on admission significant for small bilateral pleural effusion, mediastinal lymphadenopathy Found to have EF 30 to 35% with severe MR  Pertinent  Medical History    has a past medical history of Allergies, BPH (benign prostatic hyperplasia), ED (erectile dysfunction), GERD (gastroesophageal reflux disease), Peripheral focal chorioretinal inflammation of both eyes, and Retinal vasculitis of both eyes.  He has history of peripheral focal chorioretinal inflammation of both eyes diagnosed in 2020.  Followed at [facility] ophthalmology.  He was treated with methotrexate and folic acid but self discontinued after 6 months in 2021.  He has not followed up with his ophthalmologist.  Remote smoker in his 20 to 30s  Has a [employer] company.  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  No pets  Significant Hospital Events: Including procedures, antibiotic start and stop dates in addition to other pertinent events   9/10 admit  Interim History / Subjective:  Complains of chest pressure overnight, given muscle relaxant without relief. Better this morning although still complains of mild shortness of breath. On 1 L nasal cannula, wife Nathan [name] at bedside  Objective   Blood pressure (!) 122/93, pulse (!) 117, temperature 97.8 F (36.6 C), temperature source Oral, resp. rate (!) 22, height 5\' 10"  (1.778 m), weight 103 kg, SpO2 98 %.        Intake/Output Summary (Last 24 hours) at 10/15/2020 1013 Last data filed at 10/15/2020 0800 Gross per 24 hour  Intake 220 ml  Output 800 ml  Net -580 ml    Filed Weights    10/13/20 0500 10/14/20 0500 10/15/20 0402  Weight: 101.2 kg 101.9 kg 103 kg    Examination: Gen:      No acute distress HEENT:  EOMI, sclera anicteric Neck:     No masses; no thyromegaly Lungs:    No accessory muscle use, clear lungs, no crackles CV:         S3 gallop present, regular heart sounds Abd:      + bowel sounds; soft, non-tender; no palpable masses, no distension Ext:    No edema; adequate peripheral perfusion Skin:      Warm and dry; no rash Neuro: alert and oriented x 3 Psych: normal mood and affect    CT significant for bulky mediastinal, bilateral hilar lymphadenopathy, groundglass opacity bilaterally with bilateral pleural effusion.  Labs show slight decrease in creatinine from 1.6-1.5, normal electrolytes, no leukocytosis  Chest x-ray 9/12 independently reviewed shows patchy bilateral airspace disease, small effusions and bilateral hilar lymphadenopathy  Resolved Hospital Problem list     Assessment & Plan:  Bilateral mediastinal and hilar lymphadenopathy Suspect either sarcoidosis or lymphoproliferative disorder.  Sarcoidosis is more likely given history of chorioretinal inflammation 3 years ago for which he was treated with methotrexate for 6 months.  He self discontinued this when he lost insurance.   He may have a community-acquired pneumonia but the size of the lymph nodes is out of proportion. ACE level of 41 was not helpful  Continue ceftriaxone, azithromycin x 5 ds, although I doubt infection Supplemental oxygen Seems like he will need biopsy of mediastinal lymph  nodes either EBUS guided TB NA or mediastinoscopy (more invasive).  He seems to be acutely ill with life-threatening cardiac involvement hence we will start treating for sarcoidosis empirically with oral prednisone 40 mg.  Clearly cardiac evaluation takes precedence over bronchoscopy and it is likely that this procedure may be delayed.  Ideally would like systolic heart failure to be compensated  before proceeding with such procedure and meds to be optimized  Elevated troponin, BNP Bilateral effusions > improved on chest x ray New diagnosis of cardiomyopathy/acute systolic heart failure/severe MR Effusions may be related to heart failure.   Appreciate cardiology evaluation Plan is for cardiac MRI and right and left heart cath for ischemia eval Lasix and monitoring of renal function   Discussed risks and benefits of starting empiric steroids with patient and his wife Nathan and they are in agreement. Only problem I see here is potentially treating another etiology such as lymphoma but clearly he is acutely ill and benefits outweigh risk  Signature:   Cyril Mourning MD. Tonny Bollman. [facility] Pager : [phone]  If no response to pager , please call [phone] until 7 pm After 7:00 pm call Elink  [phone]    10/15/2020, 10:13 AM

## 2020-10-15 NOTE — Progress Notes (Signed)
Progress Note  Patient Name: ANIL HAVARD Date of Encounter: 10/15/2020  [facility] Cardiologist: Chilton Si, MD   Subjective   55 year old gentleman with a history of acute on chronic combined systolic and diastolic congestive heart failure, severe mitral regurgitation. He has a history of sarcoid lung disease.  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  He is scheduled tentatively to have an MRI on Thursday.  He has had worsening shortness of breath for the past 3 to 4 weeks.  Inpatient Medications    Scheduled Meds:  aspirin EC  81 mg Oral Daily   azithromycin  500 mg Oral Daily   docusate sodium  100 mg Oral BID   enoxaparin (LOVENOX) injection  40 mg Subcutaneous Q24H   feeding supplement  237 mL Oral BID BM   influenza vac split quadrivalent PF  0.5 mL Intramuscular Tomorrow-1000   loratadine  10 mg Oral Daily   montelukast  10 mg Oral Daily   multivitamin with minerals  1 tablet Oral Daily   pantoprazole  40 mg Oral Daily   sodium chloride flush  3 mL Intravenous Q12H   sodium chloride flush  3 mL Intravenous Q12H   tamsulosin  0.8 mg Oral QODAY   tamsulosin  0.8 mg Oral Daily   Continuous Infusions:  sodium chloride     cefTRIAXone (ROCEPHIN)  IV Stopped (10/14/20 1643)   PRN Meds: sodium chloride, acetaminophen **OR** acetaminophen, albuterol, HYDROcodone-acetaminophen, methocarbamol, morphine injection, nitroGLYCERIN, ondansetron **OR** ondansetron (ZOFRAN) IV, polyethylene glycol, sodium chloride flush   Vital Signs    Vitals:   10/14/20 2003 10/15/20 0028 10/15/20 0402 10/15/20 0833  BP: 114/74 (!) 108/92 119/86 (!) 122/93  Pulse: (!) 109 (!) 110 (!) 110 (!) 117  Resp: 20 20 18  (!) 22  Temp: 98.5 F (36.9 C) 97.8 F (36.6 C) 98.4 F (36.9 C) 97.8 F (36.6 C)  TempSrc: Oral Oral Oral Oral  SpO2: 98% 97% 96% 98%  Weight:   103 kg   Height:        Intake/Output Summary (Last 24 hours) at 10/15/2020 10/17/2020 Last data  filed at 10/15/2020 0800 Gross per 24 hour  Intake 220 ml  Output 800 ml  Net -580 ml   Last 3 Weights 10/15/2020 10/14/2020 10/13/2020  Weight (lbs) 227 lb 1.2 oz 224 lb 10.4 oz 223 lb 1.7 oz  Weight (kg) 103 kg 101.9 kg 101.2 kg      Telemetry    Sinus tach - Personally Reviewed  ECG     - Personally Reviewed  Physical Exam   GEN: middle age male,  mild respiratory distress  Neck: No JVD Cardiac: RRR, loud S3 gallop,   + systolic murmur  Respiratory: rales,  rhonchi, R>L GI: Soft, nontender, non-distended  MS: No edema; No deformity. Neuro:  Nonfocal  Psych: Normal affect   Labs    High Sensitivity Troponin:   Recent Labs  Lab 10/12/20 1424 10/13/20 0237 10/13/20 0524 10/14/20 0549 10/14/20 0820  TROPONINIHS 164* 154* 193* 129* 120*      Chemistry Recent Labs  Lab 10/12/20 1224 10/12/20 1252 10/13/20 0237 10/14/20 0327  NA 139  --  136 136  K 3.8  --  3.7 3.7  CL 109  --  107 106  CO2 22  --  21* 21*  GLUCOSE 96  --  103* 102*  BUN 12  --  13 14  CREATININE 1.51*  --  1.64* 1.55*  CALCIUM  9.0  --  8.6* 8.7*  PROT  --  7.3 6.2*  --   ALBUMIN  --  3.3* 2.8*  --   AST  --  30 25  --   ALT  --  19 16  --   ALKPHOS  --  73 63  --   BILITOT  --  1.1 1.1  --   GFRNONAA 55*  --  50* 53*  ANIONGAP 8  --  8 9     Hematology Recent Labs  Lab 10/12/20 1224 10/13/20 0237 10/14/20 0327  WBC 4.7 5.0 5.9  RBC 4.46 4.04* 4.07*  HGB 12.2* 11.2* 11.1*  HCT 38.8* 34.6* 34.9*  MCV 87.0 85.6 85.7  MCH 27.4 27.7 27.3  MCHC 31.4 32.4 31.8  RDW 15.2 15.1 15.2  PLT 343 211 216    BNP Recent Labs  Lab 10/12/20 1253  BNP 558.3*     DDimer  Recent Labs  Lab 10/12/20 1252  DDIMER 1.68*     Radiology    DG Chest 2 View  Result Date: 10/14/2020 CLINICAL DATA:  Pleural effusion, mediastinal adenopathy. EXAM: CHEST - 2 VIEW COMPARISON:  10/12/2020 FINDINGS: Heart is normal size. Prominence of the mediastinal and hilar contours compatible with  adenopathy as seen on prior CT. Patchy bilateral airspace disease again noted. Small bilateral effusions. IMPRESSION: Patchy bilateral airspace disease with small effusions, similar to prior CT. Mediastinal and bilateral hilar adenopathy, similar to prior study. Electronically Signed   By: Charlett Nose M.D.   On: 10/14/2020 10:09   ECHOCARDIOGRAM COMPLETE  Result Date: 10/13/2020    ECHOCARDIOGRAM REPORT   Patient Name:   CURLY MACKOWSKI Date of Exam: 10/13/2020 Medical Rec #:  [id number]       Height:       70.0 in Accession #:    [id number]      Weight:       223.1 lb Date of Birth:  August 16, 1965        BSA:          2.187 m Patient Age:    55 years        BP:           115/88 mmHg Patient Gender: M               HR:           116 bpm. Exam Location:  Inpatient Procedure: 2D Echo, Cardiac Doppler and Color Doppler Indications:    Dyspnea R06.00  History:        Patient has no prior history of Echocardiogram examinations.  Sonographer:    Eulah Pont RDCS Referring Phys: [id number] [facility] POKHREL IMPRESSIONS  1. Left ventricular ejection fraction, by estimation, is 30 to 35%. The left ventricle has moderately decreased function. The left ventricle demonstrates global hypokinesis. The left ventricular internal cavity size was severely dilated. Left ventricular diastolic parameters are indeterminate.  2. Right ventricular systolic function is normal. The right ventricular size is normal. There is moderately elevated pulmonary artery systolic pressure.  3. Left atrial size was moderately dilated.  4. The mitral valve is normal in structure. Severe mitral valve regurgitation. No evidence of mitral stenosis.  5. The aortic valve is tricuspid. Aortic valve regurgitation is trivial. Mild aortic valve sclerosis is present, with no evidence of aortic valve stenosis.  6. The inferior vena cava is normal in size with greater than 50% respiratory variability, suggesting right atrial pressure of 3 mmHg. FINDINGS  Left Ventricle:  Left ventricular ejection fraction, by estimation, is 30 to 35%. The left ventricle has moderately decreased function. The left ventricle demonstrates global hypokinesis. The left ventricular internal cavity size was severely dilated. There is no left ventricular hypertrophy. Left ventricular diastolic parameters are indeterminate. Right Ventricle: The right ventricular size is normal. Right ventricular systolic function is normal. There is moderately elevated pulmonary artery systolic pressure. The tricuspid regurgitant velocity is 3.48 m/s, and with an assumed right atrial pressure of 3 mmHg, the estimated right ventricular systolic pressure is 51.4 mmHg. Left Atrium: Left atrial size was moderately dilated. Right Atrium: Right atrial size was normal in size. Pericardium: Trivial pericardial effusion is present. Mitral Valve: The mitral valve is normal in structure. Severe mitral valve regurgitation. No evidence of mitral valve stenosis. Tricuspid Valve: The tricuspid valve is normal in structure. Tricuspid valve regurgitation is mild . No evidence of tricuspid stenosis. Aortic Valve: The aortic valve is tricuspid. Aortic valve regurgitation is trivial. Mild aortic valve sclerosis is present, with no evidence of aortic valve stenosis. Pulmonic Valve: The pulmonic valve was normal in structure. Pulmonic valve regurgitation is mild. No evidence of pulmonic stenosis. Aorta: The aortic root is normal in size and structure. Venous: The inferior vena cava is normal in size with greater than 50% respiratory variability, suggesting right atrial pressure of 3 mmHg. IAS/Shunts: No atrial level shunt detected by color flow Doppler.  LEFT VENTRICLE PLAX 2D LVIDd:         6.70 cm LVIDs:         5.50 cm LV PW:         1.20 cm LV IVS:        0.70 cm LVOT diam:     2.10 cm LV SV:         43 LV SV Index:   20 LVOT Area:     3.46 cm  LV Volumes (MOD) LV vol d, MOD A2C: 163.0 ml LV vol d, MOD A4C: 206.0 ml LV vol s, MOD A2C: 122.0  ml LV vol s, MOD A4C: 130.0 ml LV SV MOD A2C:     41.0 ml LV SV MOD A4C:     206.0 ml LV SV MOD BP:      57.8 ml RIGHT VENTRICLE TAPSE (M-mode): 1.6 cm LEFT ATRIUM             Index       RIGHT ATRIUM           Index LA diam:        4.80 cm 2.20 cm/m  RA Area:     13.10 cm LA Vol (A2C):   82.5 ml 37.73 ml/m RA Volume:   33.00 ml  15.09 ml/m LA Vol (A4C):   83.0 ml 37.96 ml/m LA Biplane Vol: 91.2 ml 41.71 ml/m  AORTIC VALVE LVOT Vmax:   99.45 cm/s LVOT Vmean:  65.500 cm/s LVOT VTI:    0.125 m  AORTA Ao Root diam: 2.90 cm Ao Asc diam:  3.20 cm MR Peak grad:    80.6 mmHg   TRICUSPID VALVE MR Mean grad:    52.0 mmHg   TR Peak grad:   48.4 mmHg MR Vmax:         449.00 cm/s TR Vmax:        348.00 cm/s MR Vmean:        336.0 cm/s MR PISA:         1.57 cm    SHUNTS MR  PISA Eff ROA: 12 mm      Systemic VTI:  0.12 m MR PISA Radius:  0.50 cm     Systemic Diam: 2.10 cm Olga Millers MD Electronically signed by Olga Millers MD Signature Date/Time: 10/13/2020/1:23:17 PM    Final     Cardiac Studies     Patient Profile     55 y.o. male with acute on chronic combined systolic and diastolic congestive heart failure associated with severe mitral regurgitation.  He is acutely short of breath this morning.  Assessment & Plan    1.  Acute respiratory distress: He is having acute shortness of breath today.  He has a loud S3 gallop on exam.  I suspect that he is volume overloaded despite not having any leg edema or having significant rales on exam.  He is coughing up pink frothy sputum which I suspect is pulmonary edema.  We will give him Lasix 40 mg IV.  We will give him potassium chloride 40 mEq this morning followed by 20 mEq twice a day.  We will draw a basic metabolic profile daily for the next several days.  He is tentatively scheduled to have a right and left heart catheterization.  He will need to be considered for mitral valve repair.  There is also some concern for cardiac sarcoidosis.   2.  History  of sarcoidosis: He is scheduled tentatively to have an cardiac MRI later this week to look for cardiac sarcoidosis.  3.  Mitral regurgitation: This looks to be secondary mitral regurgitation.  Further evaluation after getting bit better diuresed.        For questions or updates, please contact [facility] Please consult [email or web address] for contact info under        Signed, Kristeen Miss, MD  10/15/2020, 9:09 AM

## 2020-10-15 NOTE — Progress Notes (Signed)
See my separate note from same day   Progress Note  Patient Name: Nathan [name] Date of Encounter: 10/15/2020  [facility] Cardiologist: Nathan Si, MD   Subjective   55 yo wit CHF, MR   Inpatient Medications    Scheduled Meds:  aspirin EC  81 mg Oral Daily   azithromycin  500 mg Oral Daily   docusate sodium  100 mg Oral BID   enoxaparin (LOVENOX) injection  40 mg Subcutaneous Q24H   feeding supplement  237 mL Oral BID BM   influenza vac split quadrivalent PF  0.5 mL Intramuscular Tomorrow-1000   loratadine  10 mg Oral Daily   montelukast  10 mg Oral Daily   multivitamin with minerals  1 tablet Oral Daily   pantoprazole  40 mg Oral Daily   sodium chloride flush  3 mL Intravenous Q12H   sodium chloride flush  3 mL Intravenous Q12H   tamsulosin  0.8 mg Oral QODAY   tamsulosin  0.8 mg Oral Daily   Continuous Infusions:  sodium chloride     cefTRIAXone (ROCEPHIN)  IV Stopped (10/14/20 1643)   PRN Meds: sodium chloride, acetaminophen **OR** acetaminophen, albuterol, HYDROcodone-acetaminophen, methocarbamol, morphine injection, nitroGLYCERIN, ondansetron **OR** ondansetron (ZOFRAN) IV, polyethylene glycol, sodium chloride flush   Vital Signs    Vitals:   10/14/20 2003 10/15/20 0028 10/15/20 0402 10/15/20 0833  BP: 114/74 (!) 108/92 119/86 (!) 122/93  Pulse: (!) 109 (!) 110 (!) 110 (!) 117  Resp: 20 20 18  (!) 22  Temp: 98.5 F (36.9 C) 97.8 F (36.6 C) 98.4 F (36.9 C) 97.8 F (36.6 C)  TempSrc: Oral Oral Oral Oral  SpO2: 98% 97% 96% 98%  Weight:   103 kg   Height:        Intake/Output Summary (Last 24 hours) at 10/15/2020 0856 Last data filed at 10/15/2020 0800 Gross per 24 hour  Intake 220 ml  Output 800 ml  Net -580 ml   Last 3 Weights 10/15/2020 10/14/2020 10/13/2020  Weight (lbs) 227 lb 1.2 oz 224 lb 10.4 oz 223 lb 1.7 oz  Weight (kg) 103 kg 101.9 kg 101.2 kg      Telemetry    Nsr  - Personally Reviewed  ECG     - Personally  Reviewed  Physical Exam   GEN: No acute distress.   Neck: No JVD Cardiac: RRR, loud s3 gallop   Respiratory:  rales, rhonchi especially on Rside  GI: Soft, nontender, non-distended  MS: No edema; No deformity. Neuro:  Nonfocal  Psych: Normal affect   Labs    High Sensitivity Troponin:   Recent Labs  Lab 10/12/20 1424 10/13/20 0237 10/13/20 0524 10/14/20 0549 10/14/20 0820  TROPONINIHS 164* 154* 193* 129* 120*      Chemistry Recent Labs  Lab 10/12/20 1224 10/12/20 1252 10/13/20 0237 10/14/20 0327  NA 139  --  136 136  K 3.8  --  3.7 3.7  CL 109  --  107 106  CO2 22  --  21* 21*  GLUCOSE 96  --  103* 102*  BUN 12  --  13 14  CREATININE 1.51*  --  1.64* 1.55*  CALCIUM 9.0  --  8.6* 8.7*  PROT  --  7.3 6.2*  --   ALBUMIN  --  3.3* 2.8*  --   AST  --  30 25  --   ALT  --  19 16  --   ALKPHOS  --  73 63  --  BILITOT  --  1.1 1.1  --   GFRNONAA 55*  --  50* 53*  ANIONGAP 8  --  8 9     Hematology Recent Labs  Lab 10/12/20 1224 10/13/20 0237 10/14/20 0327  WBC 4.7 5.0 5.9  RBC 4.46 4.04* 4.07*  HGB 12.2* 11.2* 11.1*  HCT 38.8* 34.6* 34.9*  MCV 87.0 85.6 85.7  MCH 27.4 27.7 27.3  MCHC 31.4 32.4 31.8  RDW 15.2 15.1 15.2  PLT 343 211 216    BNP Recent Labs  Lab 10/12/20 1253  BNP 558.3*     DDimer  Recent Labs  Lab 10/12/20 1252  DDIMER 1.68*     Radiology    DG Chest 2 View  Result Date: 10/14/2020 CLINICAL DATA:  Pleural effusion, mediastinal adenopathy. EXAM: CHEST - 2 VIEW COMPARISON:  10/12/2020 FINDINGS: Heart is normal size. Prominence of the mediastinal and hilar contours compatible with adenopathy as seen on prior CT. Patchy bilateral airspace disease again noted. Small bilateral effusions. IMPRESSION: Patchy bilateral airspace disease with small effusions, similar to prior CT. Mediastinal and bilateral hilar adenopathy, similar to prior study. Electronically Signed   By: Charlett Nose M.D.   On: 10/14/2020 10:09   ECHOCARDIOGRAM  COMPLETE  Result Date: 10/13/2020    ECHOCARDIOGRAM REPORT   Patient Name:   Nathan [name] Date of Exam: 10/13/2020 Medical Rec #:  [id number]       Height:       70.0 in Accession #:    [id number]      Weight:       223.1 lb Date of Birth:  Jul 01, 1965        BSA:          2.187 m Patient Age:    55 years        BP:           115/88 mmHg Patient Gender: M               HR:           116 bpm. Exam Location:  Inpatient Procedure: 2D Echo, Cardiac Doppler and Color Doppler Indications:    Dyspnea R06.00  History:        Patient has no prior history of Echocardiogram examinations.  Sonographer:    Eulah Pont RDCS Referring Phys: [id number] [facility] [place]OKHREL IMPRESSIONS  1. Left ventricular ejection fraction, by estimation, is 30 to 35%. The left ventricle has moderately decreased function. The left ventricle demonstrates global hypokinesis. The left ventricular internal cavity size was severely dilated. Left ventricular diastolic parameters are indeterminate.  2. Right ventricular systolic function is normal. The right ventricular size is normal. There is moderately elevated pulmonary artery systolic pressure.  3. Left atrial size was moderately dilated.  4. The mitral valve is normal in structure. Severe mitral valve regurgitation. No evidence of mitral stenosis.  5. The aortic valve is tricuspid. Aortic valve regurgitation is trivial. Mild aortic valve sclerosis is present, with no evidence of aortic valve stenosis.  6. The inferior vena cava is normal in size with greater than 50% respiratory variability, suggesting right atrial pressure of 3 mmHg. FINDINGS  Left Ventricle: Left ventricular ejection fraction, by estimation, is 30 to 35%. The left ventricle has moderately decreased function. The left ventricle demonstrates global hypokinesis. The left ventricular internal cavity size was severely dilated. There is no left ventricular hypertrophy. Left ventricular diastolic parameters are indeterminate. Right Ventricle:  The right ventricular size is normal. Right ventricular  systolic function is normal. There is moderately elevated pulmonary artery systolic pressure. The tricuspid regurgitant velocity is 3.48 m/s, and with an assumed right atrial pressure of 3 mmHg, the estimated right ventricular systolic pressure is 51.4 mmHg. Left Atrium: Left atrial size was moderately dilated. Right Atrium: Right atrial size was normal in size. Pericardium: Trivial pericardial effusion is present. Mitral Valve: The mitral valve is normal in structure. Severe mitral valve regurgitation. No evidence of mitral valve stenosis. Tricuspid Valve: The tricuspid valve is normal in structure. Tricuspid valve regurgitation is mild . No evidence of tricuspid stenosis. Aortic Valve: The aortic valve is tricuspid. Aortic valve regurgitation is trivial. Mild aortic valve sclerosis is present, with no evidence of aortic valve stenosis. Pulmonic Valve: The pulmonic valve was normal in structure. Pulmonic valve regurgitation is mild. No evidence of pulmonic stenosis. Aorta: The aortic root is normal in size and structure. Venous: The inferior vena cava is normal in size with greater than 50% respiratory variability, suggesting right atrial pressure of 3 mmHg. IAS/Shunts: No atrial level shunt detected by color flow Doppler.  LEFT VENTRICLE PLAX 2D LVIDd:         6.70 cm LVIDs:         5.50 cm LV PW:         1.20 cm LV IVS:        0.70 cm LVOT diam:     2.10 cm LV SV:         43 LV SV Index:   20 LVOT Area:     3.46 cm  LV Volumes (MOD) LV vol d, MOD A2C: 163.0 ml LV vol d, MOD A4C: 206.0 ml LV vol s, MOD A2C: 122.0 ml LV vol s, MOD A4C: 130.0 ml LV SV MOD A2C:     41.0 ml LV SV MOD A4C:     206.0 ml LV SV MOD BP:      57.8 ml RIGHT VENTRICLE TAPSE (M-mode): 1.6 cm LEFT ATRIUM             Index       RIGHT ATRIUM           Index LA diam:        4.80 cm 2.20 cm/m  RA Area:     13.10 cm LA Vol (A2C):   82.5 ml 37.73 ml/m RA Volume:   33.00 ml  15.09 ml/m LA  Vol (A4C):   83.0 ml 37.96 ml/m LA Biplane Vol: 91.2 ml 41.71 ml/m  AORTIC VALVE LVOT Vmax:   99.45 cm/s LVOT Vmean:  65.500 cm/s LVOT VTI:    0.125 m  AORTA Ao Root diam: 2.90 cm Ao Asc diam:  3.20 cm MR Peak grad:    80.6 mmHg   TRICUSPID VALVE MR Mean grad:    52.0 mmHg   TR Peak grad:   48.4 mmHg MR Vmax:         449.00 cm/s TR Vmax:        348.00 cm/s MR Vmean:        336.0 cm/s MR PISA:         1.57 cm    SHUNTS MR PISA Eff ROA: 12 mm      Systemic VTI:  0.12 m MR PISA Radius:  0.50 cm     Systemic Diam: 2.10 cm Olga Millers MD Electronically signed by Olga Millers MD Signature Date/Time: 10/13/2020/1:23:17 PM    Final     Cardiac Studies   Echocardiogram 10/13/20: 1.  Left ventricular ejection fraction, by estimation, is 30 to 35%. The  left ventricle has moderately decreased function. The left ventricle  demonstrates global hypokinesis. The left ventricular internal cavity size  was severely dilated. Left  ventricular diastolic parameters are indeterminate.   2. Right ventricular systolic function is normal. The right ventricular  size is normal. There is moderately elevated pulmonary artery systolic  pressure.   3. Left atrial size was moderately dilated.   4. The mitral valve is normal in structure. Severe mitral valve  regurgitation. No evidence of mitral stenosis.   5. The aortic valve is tricuspid. Aortic valve regurgitation is trivial.  Mild aortic valve sclerosis is present, with no evidence of aortic valve  stenosis.   6. The inferior vena cava is normal in size with greater than 50%  respiratory variability, suggesting right atrial pressure of 3 mmHg.   Patient Profile     55 y.o. 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 Acute combined CHF: patient presented with SOB and chest discomfort. CXR with bilateral effusions and multifocal PNA. BNP 558. Echo revealed EF 30-35%, G1DD, severe LV dilation, indeterminate LV diastolic function, normal RV size/function, moderately elevated PA systolic pressures, moderate LAE, and severe MR. - Will diurese with IV lasix 40mg   BID today - Hopeful to add GDMT this admission if BP and Cr remain stable - Continue to monitor strict I&Os and daily weights.   2. Elevated HsTrop in patient with no known CAD: HsTrop peaked at 193 and trended down. EKG with upsloping anterolateral STD but no STE. Echo revealed EF 30-35% with global hypokinesis. No mention of coronary artery calcifications on CT.  - Continue aspirin - Plan for [facility] tomorrow if he is able to lay flat  3. Multifocal PNA: noted on CXR/CT Chest.  - Continue IV antibiotics and supportive care per primary team  4. Lymphadenopathy c/f possible sarcoidosis: patient noted to have bilateral mediastinal and hilar lymphadenopathy c/f sarcoidosis or lymphoproliferative disorder as the size of lymph nodes was felt to be out of proportion to findings c/w CAP. Pulmonology followed initially but felt risk of bronchoscopy was too high. ACE level was wnl. Awaiting cardiac MR to evaluate for possible cardiac sarcoidosis - Continuing supportive care and IV antibiotics for now      For questions or updates, please contact [facility] Please consult [email or web address] for contact info under        Signed, [facility], PA-C  10/15/2020, 8:56 AM    See my separate note from same day    10/17/2020, MD  10/15/2020 9:36 AM    [facility] [address] [place],  [address] [place], [place]  [place] Phone: [phone]; Fax: [phone]

## 2020-10-15 NOTE — Progress Notes (Addendum)
PROGRESS NOTE  Nathan [name] UQJ:[id number] DOB: 01/24/1966 DOA: 10/12/2020 PCP: Dois Davenport, MD   LOS: 3 days   Brief narrative: Nathan [name] is a 55 y.o. male with no significant past medical history presented to hospital with 3 to 4 weeks of shortness of breath and chest discomfort for 3 to 4 weeks.    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  Chest x-ray was done which showed some hilar lymphadenopathy.  Was initially noted to be hypoxic with pulse ox of 87% on room air.  Patient was also noted to be tachycardic.  Patient was then transferred to the ED for further evaluation.  In the ED, patient was noted to have elevated BNP of 558.  D- dimer was elevated at 1.6, so CT angiogram of the chest was performed which showed a hilar and midsternal lymphadenopathy, pleural effusion.  COVID test and influenza was negative.  Creatinine was elevated at 1.5.  Chest x-ray showed patchy interstitial and hazy airspace opacities suggestive of multifocal pneumonia.  Patient also received Rocephin and Zithromax and 20 mg of Lasix in the ED.  Patient was then admitted hospital for further evaluation and treatment.   Assessment/Plan:  Principal Problem:   Dyspnea Active Problems:   Shortness of breath   Mediastinal lymphadenopathy   Pneumonia   Chest pain   Acute combined systolic and diastolic heart failure (HCC)   Sarcoidosis   Severe mitral regurgitation  Dyspnea, chest pain,shortness of breath with bilateral pleural effusion.   Chest x-ray with multifocal pneumonia.  Continue with Rocephin and Zithromax for now.  CT scan shows bilateral hilar and mediastinal lymphadenopathy with pleural effusion.  Possibility of lymphoproliferative disorder/sarcoidosis.  HIV nonreactive.  Pulmonary on board.  Continue with nebulizer, supportive care, incentive spirometry.  Cardiac MRI has been planned for or  10/17/2020  Multifocal pneumonia, atypical pneumonia.  Continue Rocephin and Zithromax.    Influenza and COVID was negative.   Hilar, midsternal lymphadenopathy. As per the CT angiogram of the chest.  Likely secondary to sarcoidosis or lymphoproliferative disorder.  Pulmonary on board.  Awaiting for cardiac optimization prior to pulmonary intervention.  Patient is unable to lie flat for procedures.  Will need adequate diuresis.  Elevated creatinine likely CKD stage IIIa.  Creatinine of 1.5.  Will continue to monitor.  Elevated troponin with reduced LV function  Likely acute systolic congestive heart failure.   EKG with tachycardia.  Still with mild tachycardia.    2D echocardiogram showed LV ejection fraction of 30 to 35% with global hypokinesis and severely dilated left ventricle.  Moderate elevation of pulmonary artery systolic pressure.  Severe mitral valve regurgitation.  Cardiology on board and patient has been started on IV Lasix today.  On Lasix 40 mg twice a day.  Avoiding beta-blocker at this time as per cardiology..For cardiac catheterization as per cardiology likely 10/16/2020...   Continue aspirin for now.  Continue potassium supplements.   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  Was on methotrexate for 6 months in the past, was seen by ophthalmologist in the past.Lost follow up.  Obesity.  Present on admission.  Would benefit from weight loss as outpatient.   DVT prophylaxis: enoxaparin (LOVENOX) injection 40 mg Start: 10/12/20 1730   Code Status: Full code  Family Communication:  I spoke with the patient's wife and updated her about the clinical condition of the patient.  Status is: Inpatient  Remains inpatient appropriate because:IV treatments appropriate due to intensity of illness or inability to take PO and Inpatient level of care appropriate due to severity of illness  Dispo: The patient is from: Home              Anticipated d/c is to: Home               Patient currently is not medically stable to d/c.   Difficult to place patient No  Consultants: Pulmonary Cardiology   Procedures: None so far  Anti-infectives:  Rocephin and Zithromax IV 9/10>   Subjective: Today, patient was seen and examined at bedside.  Continues to complain of shortness of breath, dyspnea and mild chest discomfort.    Objective: Vitals:   10/15/20 0402 10/15/20 0833  BP: 119/86 (!) 122/93  Pulse: (!) 110 (!) 117  Resp: 18 (!) 22  Temp: 98.4 F (36.9 C) 97.8 F (36.6 C)  SpO2: 96% 98%    Intake/Output Summary (Last 24 hours) at 10/15/2020 0932 Last data filed at 10/15/2020 0800 Gross per 24 hour  Intake 220 ml  Output 800 ml  Net -580 ml    Filed Weights   10/13/20 0500 10/14/20 0500 10/15/20 0402  Weight: 101.2 kg 101.9 kg 103 kg   Body mass index is 32.58 kg/m.   Physical Exam: GENERAL: Patient is alert awake and communicative, appears to be in mild distress, feels dyspneic.  On nasal cannula oxygen and is obese.  HENT: No scleral pallor or icterus. Pupils equally reactive to light. Oral mucosa is moist NECK: is supple, no gross swelling noted. CHEST: Diminished breath sounds bilaterally.  Coarse breath sounds noted with some crackles. CVS: S1 and S2 heard, systolic murmur, tachycardia noted. ABDOMEN: Soft, non-tender, bowel sounds are present. EXTREMITIES: No edema. CNS: Cranial nerves are intact. No focal motor deficits. SKIN: warm and dry without rashes.  Data Review: I have personally reviewed the following laboratory data and studies,  CBC: Recent Labs  Lab 10/12/20 1224 10/13/20 0237 10/14/20 0327  WBC 4.7 5.0 5.9  HGB 12.2* 11.2* 11.1*  HCT 38.8* 34.6* 34.9*  MCV 87.0 85.6 85.7  PLT 343 211 216    Basic Metabolic Panel: Recent Labs  Lab 10/12/20 1224 10/13/20 0237 10/14/20 0327  NA 139 136 136  K 3.8 3.7 3.7  CL 109 107 106  CO2 22 21* 21*  GLUCOSE 96 103* 102*  BUN 12 13 14   CREATININE 1.51* 1.64* 1.55*   CALCIUM 9.0 8.6* 8.7*  MG  --  1.8 1.8  PHOS  --  3.7  --     Liver Function Tests: Recent Labs  Lab 10/12/20 1252 10/13/20 0237  AST 30 25  ALT 19 16  ALKPHOS 73 63  BILITOT 1.1 1.1  PROT 7.3 6.2*  ALBUMIN 3.3* 2.8*    Recent Labs  Lab 10/12/20 1252  LIPASE 28    No results for input(s): AMMONIA in the last 168 hours. Cardiac Enzymes: No results for input(s): CKTOTAL, CKMB, CKMBINDEX, TROPONINI in the last 168 hours. BNP (last 3 results) Recent Labs    10/12/20 1253  BNP 558.3*     ProBNP (last 3 results) No results for input(s): PROBNP in the last 8760 hours.  CBG: No results for input(s): GLUCAP in the last 168 hours. Recent Results (from the past 240 hour(s))  Resp Panel by RT-PCR (Flu A&B, Covid) Nasopharyngeal Swab     Status: None   Collection Time: 10/12/20  1:20 PM   Specimen: Nasopharyngeal Swab; Nasopharyngeal(NP) swabs in vial transport medium  Result Value Ref Range Status   SARS Coronavirus 2 by RT PCR NEGATIVE NEGATIVE Final    Comment: (NOTE) SARS-CoV-2 target nucleic acids are NOT DETECTED.  The SARS-CoV-2 RNA is generally detectable in upper respiratory specimens during the acute phase of infection. The lowest concentration of SARS-CoV-2 viral copies this assay can detect is 138 copies/mL. A negative result does not preclude SARS-Cov-2 infection and should not be used as the sole basis for treatment or other patient management decisions. A negative result may occur with  improper specimen collection/handling, submission of specimen other than nasopharyngeal swab, presence of viral mutation(s) within the areas targeted by this assay, and inadequate number of viral copies(<138 copies/mL). A negative result must be combined with clinical observations, patient history, and epidemiological information. The expected result is Negative.  Fact Sheet for Patients:  BloggerCourse.com  Fact Sheet for Healthcare  Providers:  SeriousBroker.it  This test is no t yet approved or cleared by the [place] FDA and  has been authorized for detection and/or diagnosis of SARS-CoV-2 by FDA under an Emergency Use Authorization (EUA). This EUA will remain  in effect (meaning this test can be used) for the duration of the COVID-19 declaration under Section 564(b)(1) of the Act, 21 U.S.C.section 360bbb-3(b)(1), unless the authorization is terminated  or revoked sooner.       Influenza A by PCR NEGATIVE NEGATIVE Final   Influenza B by PCR NEGATIVE NEGATIVE Final    Comment: (NOTE) The Xpert Xpress SARS-CoV-2/FLU/RSV plus assay is intended as an aid in the diagnosis of influenza from Nasopharyngeal swab specimens and should not be used as a sole basis for treatment. Nasal washings and aspirates are unacceptable for Xpert Xpress SARS-CoV-2/FLU/RSV testing.  Fact Sheet for Patients: BloggerCourse.com  Fact Sheet for Healthcare Providers: SeriousBroker.it  This test is not yet approved or cleared by the [place] FDA and has been authorized for detection and/or diagnosis of SARS-CoV-2 by FDA under an Emergency Use Authorization (EUA). This EUA will remain in effect (meaning this test can be used) for the duration of the COVID-19 declaration under Section 564(b)(1) of the Act, 21 U.S.C. section 360bbb-3(b)(1), unless the authorization is terminated or revoked.  Performed at [facility], [address], [place], [place] [address]       Studies: DG Chest 2 View  Result Date: 10/14/2020 CLINICAL DATA:  Pleural effusion, mediastinal adenopathy. EXAM: CHEST - 2 VIEW COMPARISON:  10/12/2020 FINDINGS: Heart is normal size. Prominence of the mediastinal and hilar contours compatible with adenopathy as seen on prior CT. Patchy bilateral airspace disease again noted. Small bilateral effusions. IMPRESSION: Patchy bilateral  airspace disease with small effusions, similar to prior CT. Mediastinal and bilateral hilar adenopathy, similar to prior study. Electronically Signed   By: Charlett Nose M.D.   On: 10/14/2020 10:09   ECHOCARDIOGRAM COMPLETE  Result Date: 10/13/2020    ECHOCARDIOGRAM REPORT   Patient Name:   Nathan [name] Date of Exam: 10/13/2020 Medical Rec #:  [id number]       Height:       70.0 in Accession #:    [id number]      Weight:       223.1 lb Date of Birth:  26-Sep-1965        BSA:          2.187 m Patient Age:    58 years  BP:           115/88 mmHg Patient Gender: M               HR:           116 bpm. Exam Location:  Inpatient Procedure: 2D Echo, Cardiac Doppler and Color Doppler Indications:    Dyspnea R06.00  History:        Patient has no prior history of Echocardiogram examinations.  Sonographer:    Eulah Pont RDCS Referring Phys: [id number] [facility] Adreyan Carbajal IMPRESSIONS  1. Left ventricular ejection fraction, by estimation, is 30 to 35%. The left ventricle has moderately decreased function. The left ventricle demonstrates global hypokinesis. The left ventricular internal cavity size was severely dilated. Left ventricular diastolic parameters are indeterminate.  2. Right ventricular systolic function is normal. The right ventricular size is normal. There is moderately elevated pulmonary artery systolic pressure.  3. Left atrial size was moderately dilated.  4. The mitral valve is normal in structure. Severe mitral valve regurgitation. No evidence of mitral stenosis.  5. The aortic valve is tricuspid. Aortic valve regurgitation is trivial. Mild aortic valve sclerosis is present, with no evidence of aortic valve stenosis.  6. The inferior vena cava is normal in size with greater than 50% respiratory variability, suggesting right atrial pressure of 3 mmHg. FINDINGS  Left Ventricle: Left ventricular ejection fraction, by estimation, is 30 to 35%. The left ventricle has moderately decreased function. The left  ventricle demonstrates global hypokinesis. The left ventricular internal cavity size was severely dilated. There is no left ventricular hypertrophy. Left ventricular diastolic parameters are indeterminate. Right Ventricle: The right ventricular size is normal. Right ventricular systolic function is normal. There is moderately elevated pulmonary artery systolic pressure. The tricuspid regurgitant velocity is 3.48 m/s, and with an assumed right atrial pressure of 3 mmHg, the estimated right ventricular systolic pressure is 51.4 mmHg. Left Atrium: Left atrial size was moderately dilated. Right Atrium: Right atrial size was normal in size. Pericardium: Trivial pericardial effusion is present. Mitral Valve: The mitral valve is normal in structure. Severe mitral valve regurgitation. No evidence of mitral valve stenosis. Tricuspid Valve: The tricuspid valve is normal in structure. Tricuspid valve regurgitation is mild . No evidence of tricuspid stenosis. Aortic Valve: The aortic valve is tricuspid. Aortic valve regurgitation is trivial. Mild aortic valve sclerosis is present, with no evidence of aortic valve stenosis. Pulmonic Valve: The pulmonic valve was normal in structure. Pulmonic valve regurgitation is mild. No evidence of pulmonic stenosis. Aorta: The aortic root is normal in size and structure. Venous: The inferior vena cava is normal in size with greater than 50% respiratory variability, suggesting right atrial pressure of 3 mmHg. IAS/Shunts: No atrial level shunt detected by color flow Doppler.  LEFT VENTRICLE PLAX 2D LVIDd:         6.70 cm LVIDs:         5.50 cm LV PW:         1.20 cm LV IVS:        0.70 cm LVOT diam:     2.10 cm LV SV:         43 LV SV Index:   20 LVOT Area:     3.46 cm  LV Volumes (MOD) LV vol d, MOD A2C: 163.0 ml LV vol d, MOD A4C: 206.0 ml LV vol s, MOD A2C: 122.0 ml LV vol s, MOD A4C: 130.0 ml LV SV MOD A2C:     41.0 ml LV SV  MOD A4C:     206.0 ml LV SV MOD BP:      57.8 ml RIGHT VENTRICLE  TAPSE (M-mode): 1.6 cm LEFT ATRIUM             Index       RIGHT ATRIUM           Index LA diam:        4.80 cm 2.20 cm/m  RA Area:     13.10 cm LA Vol (A2C):   82.5 ml 37.73 ml/m RA Volume:   33.00 ml  15.09 ml/m LA Vol (A4C):   83.0 ml 37.96 ml/m LA Biplane Vol: 91.2 ml 41.71 ml/m  AORTIC VALVE LVOT Vmax:   99.45 cm/s LVOT Vmean:  65.500 cm/s LVOT VTI:    0.125 m  AORTA Ao Root diam: 2.90 cm Ao Asc diam:  3.20 cm MR Peak grad:    80.6 mmHg   TRICUSPID VALVE MR Mean grad:    52.0 mmHg   TR Peak grad:   48.4 mmHg MR Vmax:         449.00 cm/s TR Vmax:        348.00 cm/s MR Vmean:        336.0 cm/s MR PISA:         1.57 cm    SHUNTS MR PISA Eff ROA: 12 mm      Systemic VTI:  0.12 m MR PISA Radius:  0.50 cm     Systemic Diam: 2.10 cm Olga Millers MD Electronically signed by Olga Millers MD Signature Date/Time: 10/13/2020/1:23:17 PM    Final      Joycelyn Das, MD  [facility] 10/15/2020  If 7PM-7AM, please contact night-coverage

## 2020-10-15 NOTE — Plan of Care (Signed)

## 2020-10-16 ENCOUNTER — Encounter (HOSPITAL_COMMUNITY)
Admission: EM | Disposition: A | Discharge status: Home / Self Care | Payer: Self-pay | Source: Home / Self Care | Attending: Internal Medicine

## 2020-10-16 DIAGNOSIS — I340 Nonrheumatic mitral (valve) insufficiency: Secondary | ICD-10-CM | POA: Diagnosis not present

## 2020-10-16 DIAGNOSIS — I272 Pulmonary hypertension, unspecified: Secondary | ICD-10-CM | POA: Diagnosis not present

## 2020-10-16 DIAGNOSIS — I420 Dilated cardiomyopathy: Secondary | ICD-10-CM

## 2020-10-16 DIAGNOSIS — I5041 Acute combined systolic (congestive) and diastolic (congestive) heart failure: Secondary | ICD-10-CM | POA: Diagnosis not present

## 2020-10-16 DIAGNOSIS — R590 Localized enlarged lymph nodes: Secondary | ICD-10-CM

## 2020-10-16 DIAGNOSIS — R0602 Shortness of breath: Secondary | ICD-10-CM | POA: Diagnosis not present

## 2020-10-16 HISTORY — PX: RIGHT/LEFT HEART CATH AND CORONARY ANGIOGRAPHY: CATH118266

## 2020-10-16 LAB — POCT I-STAT EG7
Acid-Base Excess: 0 mmol/L (ref 0.0–2.0)
Acid-base deficit: 1 mmol/L (ref 0.0–2.0)
Bicarbonate: 24.3 mmol/L (ref 20.0–28.0)
Bicarbonate: 22.7 mmol/L (ref 20.0–28.0)
Calcium, Ion: 1.21 mmol/L (ref 1.15–1.40)
Calcium, Ion: 1.24 mmol/L (ref 1.15–1.40)
HCT: 36 % — ABNORMAL LOW (ref 39.0–52.0)
HCT: 36 % — ABNORMAL LOW (ref 39.0–52.0)
Hemoglobin: 12.2 g/dL — ABNORMAL LOW (ref 13.0–17.0)
Hemoglobin: 12.2 g/dL — ABNORMAL LOW (ref 13.0–17.0)
O2 Saturation: 66 %
O2 Saturation: 69 %
Potassium: 4.3 mmol/L (ref 3.5–5.1)
Potassium: 4.4 mmol/L (ref 3.5–5.1)
Sodium: 139 mmol/L (ref 135–145)
Sodium: 138 mmol/L (ref 135–145)
TCO2: 25 mmol/L (ref 22–32)
TCO2: 24 mmol/L (ref 22–32)
pCO2, Ven: 37.2 mmHg — ABNORMAL LOW (ref 44.0–60.0)
pCO2, Ven: 34.2 mmHg — ABNORMAL LOW (ref 44.0–60.0)
pH, Ven: 7.423 (ref 7.250–7.430)
pH, Ven: 7.43 (ref 7.250–7.430)
pO2, Ven: 33 mmHg (ref 32.0–45.0)
pO2, Ven: 35 mmHg (ref 32.0–45.0)

## 2020-10-16 LAB — POCT I-STAT 7, (LYTES, BLD GAS, ICA,H+H)
Acid-base deficit: 2 mmol/L (ref 0.0–2.0)
Bicarbonate: 20.7 mmol/L (ref 20.0–28.0)
Calcium, Ion: 1.22 mmol/L (ref 1.15–1.40)
HCT: 36 % — ABNORMAL LOW (ref 39.0–52.0)
Hemoglobin: 12.2 g/dL — ABNORMAL LOW (ref 13.0–17.0)
O2 Saturation: 96 %
Potassium: 4.3 mmol/L (ref 3.5–5.1)
Sodium: 138 mmol/L (ref 135–145)
TCO2: 22 mmol/L (ref 22–32)
pCO2 arterial: 29.7 mmHg — ABNORMAL LOW (ref 32.0–48.0)
pH, Arterial: 7.451 — ABNORMAL HIGH (ref 7.350–7.450)
pO2, Arterial: 75 mmHg — ABNORMAL LOW (ref 83.0–108.0)

## 2020-10-16 LAB — MAGNESIUM: Magnesium: 2.1 mg/dL (ref 1.7–2.4)

## 2020-10-16 LAB — CBC
HCT: 34.9 % — ABNORMAL LOW (ref 39.0–52.0)
Hemoglobin: 11.4 g/dL — ABNORMAL LOW (ref 13.0–17.0)
MCH: 27.5 pg (ref 26.0–34.0)
MCHC: 32.7 g/dL (ref 30.0–36.0)
MCV: 84.3 fL (ref 80.0–100.0)
Platelets: 243 10*3/uL (ref 150–400)
RBC: 4.14 MIL/uL — ABNORMAL LOW (ref 4.22–5.81)
RDW: 14.9 % (ref 11.5–15.5)
WBC: 5.7 10*3/uL (ref 4.0–10.5)
nRBC: 0 % (ref 0.0–0.2)

## 2020-10-16 LAB — BASIC METABOLIC PANEL
Anion gap: 8 (ref 5–15)
BUN: 20 mg/dL (ref 6–20)
CO2: 23 mmol/L (ref 22–32)
Calcium: 9.2 mg/dL (ref 8.9–10.3)
Chloride: 103 mmol/L (ref 98–111)
Creatinine, Ser: 1.44 mg/dL — ABNORMAL HIGH (ref 0.61–1.24)
GFR, Estimated: 58 mL/min — ABNORMAL LOW (ref >60)
Glucose, Bld: 114 mg/dL — ABNORMAL HIGH (ref 70–99)
Potassium: 4.8 mmol/L (ref 3.5–5.1)
Sodium: 134 mmol/L — ABNORMAL LOW (ref 135–145)

## 2020-10-16 LAB — PHOSPHORUS: Phosphorus: 3.5 mg/dL (ref 2.5–4.6)

## 2020-10-16 SURGERY — RIGHT/LEFT HEART CATH AND CORONARY ANGIOGRAPHY
Anesthesia: LOCAL

## 2020-10-16 MED ORDER — HEPARIN SODIUM (PORCINE) 1000 UNIT/ML IJ SOLN
INTRAMUSCULAR | Status: DC | PRN
  Administered 2020-10-16: 5000 [IU] via INTRAVENOUS

## 2020-10-16 MED ORDER — SODIUM CHLORIDE 0.9% FLUSH: 3.0000 mL | INTRAVENOUS | Status: DC | PRN

## 2020-10-16 MED ORDER — HYDRALAZINE HCL 20 MG/ML IJ SOLN: 10.0000 mg | INTRAMUSCULAR | Status: AC | PRN

## 2020-10-16 MED ORDER — LABETALOL HCL 5 MG/ML IV SOLN: 10.0000 mg | INTRAVENOUS | Status: AC | PRN

## 2020-10-16 MED ORDER — LIDOCAINE HCL (PF) 1 % IJ SOLN
INTRAMUSCULAR | Status: AC
  Filled 2020-10-16: qty 30

## 2020-10-16 MED ORDER — IOHEXOL 350 MG/ML SOLN
INTRAVENOUS | Status: DC | PRN
  Administered 2020-10-16: 50 mL

## 2020-10-16 MED ORDER — MIDAZOLAM HCL 2 MG/2ML IJ SOLN
INTRAMUSCULAR | Status: DC | PRN
  Administered 2020-10-16: 1 mg via INTRAVENOUS

## 2020-10-16 MED ORDER — VERAPAMIL HCL 2.5 MG/ML IV SOLN
INTRAVENOUS | Status: AC
  Filled 2020-10-16: qty 2

## 2020-10-16 MED ORDER — SODIUM CHLORIDE 0.9 % IV SOLN: INTRAVENOUS | Status: AC

## 2020-10-16 MED ORDER — HEPARIN (PORCINE) IN NACL 1000-0.9 UT/500ML-% IV SOLN
INTRAVENOUS | Status: AC
  Filled 2020-10-16: qty 1000

## 2020-10-16 MED ORDER — MIDAZOLAM HCL 2 MG/2ML IJ SOLN
INTRAMUSCULAR | Status: AC
  Filled 2020-10-16: qty 2

## 2020-10-16 MED ORDER — ASPIRIN 81 MG PO CHEW
81.0000 mg | CHEWABLE_TABLET | ORAL | Status: AC
  Administered 2020-10-16: 81 mg via ORAL
  Filled 2020-10-16: qty 1

## 2020-10-16 MED ORDER — LIDOCAINE HCL (PF) 1 % IJ SOLN
INTRAMUSCULAR | Status: DC | PRN
  Administered 2020-10-16 (×2): 2 mL

## 2020-10-16 MED ORDER — HEPARIN (PORCINE) IN NACL 1000-0.9 UT/500ML-% IV SOLN
INTRAVENOUS | Status: DC | PRN
  Administered 2020-10-16 (×2): 500 mL

## 2020-10-16 MED ORDER — SODIUM CHLORIDE 0.9 % IV SOLN: 250.0000 mL | INTRAVENOUS | Status: DC | PRN

## 2020-10-16 MED ORDER — SODIUM CHLORIDE 0.9 % IV SOLN: INTRAVENOUS | Status: DC

## 2020-10-16 MED ORDER — ASPIRIN 81 MG PO CHEW: 81.0000 mg | CHEWABLE_TABLET | ORAL | Status: DC

## 2020-10-16 MED ORDER — VERAPAMIL HCL 2.5 MG/ML IV SOLN
INTRAVENOUS | Status: DC | PRN
  Administered 2020-10-16: 10 mL via INTRA_ARTERIAL

## 2020-10-16 MED ORDER — FUROSEMIDE 10 MG/ML IJ SOLN
INTRAMUSCULAR | Status: DC | PRN
  Administered 2020-10-16: 40 mg via INTRAVENOUS

## 2020-10-16 MED ORDER — HEPARIN SODIUM (PORCINE) 1000 UNIT/ML IJ SOLN
INTRAMUSCULAR | Status: AC
  Filled 2020-10-16: qty 1

## 2020-10-16 MED ORDER — SODIUM CHLORIDE 0.9% FLUSH
3.0000 mL | Freq: Two times a day (BID) | INTRAVENOUS | Status: DC
  Administered 2020-10-17 – 2020-10-22 (×8): 3 mL via INTRAVENOUS

## 2020-10-16 MED ORDER — FUROSEMIDE 10 MG/ML IJ SOLN
INTRAMUSCULAR | Status: AC
  Filled 2020-10-16: qty 4

## 2020-10-16 SURGICAL SUPPLY — 12 items
CATH BALLN WEDGE 5F 110CM (CATHETERS) ×1 IMPLANT
CATH OPTITORQUE TIG 4.0 5F (CATHETERS) ×1 IMPLANT
DEVICE RAD COMP TR BAND LRG (VASCULAR PRODUCTS) ×1 IMPLANT
GLIDESHEATH SLEND SS 6F .021 (SHEATH) ×1 IMPLANT
GUIDEWIRE INQWIRE 1.5J.035X260 (WIRE) IMPLANT
INQWIRE 1.5J .035X260CM (WIRE) ×2
KIT HEART LEFT (KITS) ×2 IMPLANT
PACK CARDIAC CATHETERIZATION (CUSTOM PROCEDURE TRAY) ×2 IMPLANT
SHEATH GLIDE SLENDER 4/5FR (SHEATH) ×1 IMPLANT
SHEATH PROBE COVER 6X72 (BAG) ×1 IMPLANT
TRANSDUCER W/STOPCOCK (MISCELLANEOUS) ×2 IMPLANT
TUBING CIL FLEX 10 FLL-RA (TUBING) ×2 IMPLANT

## 2020-10-16 NOTE — Assessment & Plan Note (Addendum)
Patient with known ocular sarcoidosis. Patient was forced to discontinue therapy when he lost insurance. . The patient will undergo a cardiac MRI to evaluate for cardiac sarcoidosis. Prednisone started.

## 2020-10-16 NOTE — Assessment & Plan Note (Signed)
Treated effectively with diuresis. Monitor.

## 2020-10-16 NOTE — H&P (View-Only) (Signed)
Progress Note  Patient Name: Nathan [name] Date of Encounter: 10/16/2020  [facility] Cardiologist: Chilton Si, MD   Subjective   Short of breath improved with IV lasix. He slept better, no cp.  Inpatient Medications    Scheduled Meds:  aspirin EC  81 mg Oral Daily   azithromycin  500 mg Oral Daily   docusate sodium  100 mg Oral BID   enoxaparin (LOVENOX) injection  40 mg Subcutaneous Q24H   feeding supplement  237 mL Oral BID BM   furosemide  40 mg Intravenous BID   loratadine  10 mg Oral Daily   montelukast  10 mg Oral Daily   multivitamin with minerals  1 tablet Oral Daily   pantoprazole  40 mg Oral Daily   potassium chloride  20 mEq Oral BID   predniSONE  40 mg Oral Q breakfast   sodium chloride flush  3 mL Intravenous Q12H   sodium chloride flush  3 mL Intravenous Q12H   tamsulosin  0.8 mg Oral QODAY   tamsulosin  0.8 mg Oral Daily   Continuous Infusions:  sodium chloride     sodium chloride     sodium chloride 10 mL/hr at 10/16/20 0505   cefTRIAXone (ROCEPHIN)  IV 2 g (10/15/20 1524)   PRN Meds: sodium chloride, sodium chloride, acetaminophen **OR** acetaminophen, albuterol, methocarbamol, morphine injection, nitroGLYCERIN, ondansetron **OR** ondansetron (ZOFRAN) IV, oxyCODONE-acetaminophen, polyethylene glycol, sodium chloride flush, sodium chloride flush   Vital Signs    Vitals:   10/16/20 0019 10/16/20 0402 10/16/20 0815 10/16/20 1105  BP: 116/83 110/85 120/86 113/82  Pulse: (!) 104 (!) 107 (!) 116 (!) 109  Resp: 18 18 16  (!) 26  Temp: 97.6 F (36.4 C) 97.7 F (36.5 C) 98 F (36.7 C) 98 F (36.7 C)  TempSrc: Oral Oral Oral Oral  SpO2: 95% 99% 100% 98%  Weight:  101.1 kg    Height:        Intake/Output Summary (Last 24 hours) at 10/16/2020 1120 Last data filed at 10/16/2020 1101 Gross per 24 hour  Intake 3.44 ml  Output 1450 ml  Net -1446.56 ml   Last 3 Weights 10/16/2020 10/15/2020 10/14/2020  Weight (lbs) 222 lb 14.2 oz 227 lb 1.2  oz 224 lb 10.4 oz  Weight (kg) 101.1 kg 103 kg 101.9 kg      Telemetry    10 beats of ventricular tachycardia were noted at 3:55 AM, nursing notified. Currently sinus taCHYCARDIA - Personally Reviewed  ECG    No new- Personally Reviewed  Physical Exam   GEN: No acute distress.   Neck: No JVD Cardiac: RRR, no murmurs, rubs, or gallops.  Respiratory: Clear to auscultation bilaterally. GI: Soft, nontender, non-distended  MS: No edema; No deformity. Neuro:  Nonfocal  Psych: Normal affect   Labs    High Sensitivity Troponin:   Recent Labs  Lab 10/12/20 1424 10/13/20 0237 10/13/20 0524 10/14/20 0549 10/14/20 0820  TROPONINIHS 164* 154* 193* 129* 120*      Chemistry Recent Labs  Lab 10/12/20 1252 10/13/20 0237 10/14/20 0327 10/15/20 1028 10/16/20 0343  NA  --  136 136 134* 134*  K  --  3.7 3.7 4.1 4.8  CL  --  107 106 101 103  CO2  --  21* 21* 24 23  GLUCOSE  --  103* 102* 102* 114*  BUN  --  13 14 13 20   CREATININE  --  1.64* 1.55* 1.42* 1.44*  CALCIUM  --  8.6* 8.7* 9.1 9.2  PROT 7.3 6.2*  --   --   --   ALBUMIN 3.3* 2.8*  --   --   --   AST 30 25  --   --   --   ALT 19 16  --   --   --   ALKPHOS 73 63  --   --   --   BILITOT 1.1 1.1  --   --   --   GFRNONAA  --  50* 53* 59* 58*  ANIONGAP  --  8 9 9 8      Hematology Recent Labs  Lab 10/13/20 0237 10/14/20 0327 10/16/20 0343  WBC 5.0 5.9 5.7  RBC 4.04* 4.07* 4.14*  HGB 11.2* 11.1* 11.4*  HCT 34.6* 34.9* 34.9*  MCV 85.6 85.7 84.3  MCH 27.7 27.3 27.5  MCHC 32.4 31.8 32.7  RDW 15.1 15.2 14.9  PLT 211 216 243    BNP Recent Labs  Lab 10/12/20 1253  BNP 558.3*     DDimer  Recent Labs  Lab 10/12/20 1252  DDIMER 1.68*     Radiology    No results found.  Cardiac Studies   Echocardiogram 10/13/2020:    1. Left ventricular ejection fraction, by estimation, is 30 to 35%. The  left ventricle has moderately decreased function. The left ventricle  demonstrates global hypokinesis. The  left ventricular internal cavity size  was severely dilated. Left  ventricular diastolic parameters are indeterminate.   2. Right ventricular systolic function is normal. The right ventricular  size is normal. There is moderately elevated pulmonary artery systolic  pressure.   3. Left atrial size was moderately dilated.   4. The mitral valve is normal in structure. Severe mitral valve  regurgitation. No evidence of mitral stenosis.   5. The aortic valve is tricuspid. Aortic valve regurgitation is trivial.  Mild aortic valve sclerosis is present, with no evidence of aortic valve  stenosis.   6. The inferior vena cava is normal in size with greater than 50%  respiratory variability, suggesting right atrial pressure of 3 mmHg.   Patient Profile     55 y.o. 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  Worsening shortness of breath over the past 4 weeks.  Assessment & Plan    Shortness of breath, pulmonary sarcoid, acute systolic heart failure.  - Yesterday suspicion was volume overload because of S3 gallop.  No significant leg edema or rales noted on exam.  Coughing up pink frothy sputum from pulmonary edema.  He was given Lasix 40 mg IV yesterday with potassium.  Watching his basic metabolic profile. On lasix 40 BID IV. Improved symptoms.  -Awaiting cardiac MRI to help diagnose cardiac sarcoid, Thursday -Awaiting right and left heart catheterization for consideration of potential mitral valve repair. This is for today 2:30.  Mitral valve regurgitation-severe - Likely secondary to dilated left ventricle in the setting of cardiomyopathy.  Make sense to further diuresis and to reassess.  Elevated troponin - Demand ischemia in the setting of systolic heart failure     For questions or updates, please contact [facility] Please consult [email or web address]  for contact info under        Signed, Tuesday, MD  10/16/2020, 11:20 AM  '

## 2020-10-16 NOTE — Progress Notes (Signed)
NAME:  Nathan [name], MRN:  [id number], DOB:  12-10-1965, LOS: 4 ADMISSION DATE:  10/12/2020, CONSULTATION DATE:  10/12/2020 REFERRING MD:  Joycelyn Das MD, CHIEF COMPLAINT:  Abnormal CT scan   History of Present Illness:  55 Y/O with remote smoking history, peripheral focal chorioretinal inflammation eyes  Admitted with dyspnea, chest discomfort.  CT scan on admission significant for small bilateral pleural effusion, mediastinal lymphadenopathy Found to have EF 30 to 35% with severe MR  Pertinent  Medical History    has a past medical history of Allergies, BPH (benign prostatic hyperplasia), ED (erectile dysfunction), GERD (gastroesophageal reflux disease), Peripheral focal chorioretinal inflammation of both eyes, and Retinal vasculitis of both eyes.  He has history of peripheral focal chorioretinal inflammation of both eyes diagnosed in 2020.  Followed at [facility] ophthalmology.  He was treated with methotrexate and folic acid but self discontinued after 6 months in 2021.  He has not followed up with his ophthalmologist.  Remote smoker in his 20 to 30s  Has a [employer] company.  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  No pets  Significant Hospital Events: Including procedures, antibiotic start and stop dates in addition to other pertinent events   9/10 admit  Interim History / Subjective:   Shortness of breath is improved with lasix IV yesterday. He was also started on prednisone empirically for concern of sarcoidosis.  Objective   Blood pressure 113/82, pulse (!) 109, temperature 98 F (36.7 C), temperature source Oral, resp. rate (!) 26, height 5\' 10"  (1.778 m), weight 101.1 kg, SpO2 98 %.        Intake/Output Summary (Last 24 hours) at 10/16/2020 1210 Last data filed at 10/16/2020 1101 Gross per 24 hour  Intake 3.44 ml  Output 1450 ml  Net -1446.56 ml   Filed Weights   10/14/20 0500 10/15/20 0402 10/16/20 0402  Weight: 101.9 kg 103 kg 101.1  kg    Examination: Gen:      No acute distress, obese male HEENT:  EOMI, sclera anicteric, moist mucous memranes Lungs:    No accessory muscle use, clear lungs, no crackles CV:         S3 gallop present, regular heart sounds Abd:      + bowel sounds; soft, non-tender; no palpable masses, no distension Ext:    No edema, warm Skin:      Warm and dry; no rash Neuro: alert and oriented x 3   CT significant for bulky mediastinal, bilateral hilar lymphadenopathy, groundglass opacity bilaterally with bilateral pleural effusion.  Resolved Hospital Problem list     Assessment & Plan:  Bilateral mediastinal and hilar lymphadenopathy Suspect either sarcoidosis or lymphoproliferative disorder.  Sarcoidosis is more likely given history of chorioretinal inflammation 3 years ago for which he was treated with methotrexate for 6 months.  He self discontinued this when he lost insurance.   He may have a community-acquired pneumonia but the size of the lymph nodes is out of proportion.  -Ceftriaxone and azithromycin 5 day course to be completed today -Wean Supplemental oxygen as able, goal SpO2 92-94% -Will plan for bronchoscopy/EBUS when cardiac workup is completed. Possibly Friday at the earliest possible date. Can consider scheduling on an outpatient basis as well. - Started 40mg  prednisone daily empirically for concern of sarcoidosis on 9/13 (risks/benefits discussed with patient and family by Dr. )  Elevated troponin, BNP Bilateral effusions > improved on chest x ray New diagnosis of cardiomyopathy/acute systolic heart  failure/severe MR Effusions likely related to heart failure.   Appreciate cardiology evaluation Plan is for cardiac MRI on Thursday and right and left heart cath today Lasix and monitoring of renal function  Signature:   Melody Comas, MD [facility] Office: [phone]   See Amion for personal pager PCCM on call pager [phone] until  7pm. Please call Elink 7p-7a. [phone]    10/16/2020, 12:10 PM

## 2020-10-16 NOTE — Progress Notes (Signed)
Patient brought to 4E from Cath lab. Telemetry wall monitor applied, CCMD notified. VSS. CHG bath completed. Patient oriented to staff and room. Call bell in reach.  Kenard Gower, RN

## 2020-10-16 NOTE — Assessment & Plan Note (Addendum)
Upon presentation the patient demonstrated bilateral patchy infiltrates most prominent in the right upper lobe. The patient will complete treatment for community acquired pneumonia today. However per pulmonary the lymphadenopathy is out of scale for pneumonia. Testing for influenza and COVID-19 are negative.

## 2020-10-16 NOTE — Assessment & Plan Note (Signed)
Chest x-ray with multifocal pneumonia. Continue with Rocephin and Zithromax for now.  Ruled out for MI by EKG and enzyme criteria.CT scan shows bilateral hilar and mediastinal lymphadenopathywith pleural effusion. Possibility of lymphoproliferative disorder/sarcoidosis. HIV nonreactive. Pulmonary on board.  Continue with nebulizer, supportive care, incentive spirometry. Cardiac MRI has been planned for or 10/17/2020

## 2020-10-16 NOTE — Assessment & Plan Note (Addendum)
Pt to undergo left and right cardiac catheterization today to evaluate for consideration of possible mitral valve repair. Diurese and evaluate for the present time.

## 2020-10-16 NOTE — Interval H&P Note (Signed)
History and Physical Interval Note:  10/16/2020 4:38 PM  Nathan [name]  has presented today for surgery, with the diagnosis of chest pain -MITRAL REGURGITATION, CARDIOMYOPATHY.  The various methods of treatment have been discussed with the patient and family. After consideration of risks, benefits and other options for treatment, the patient has consented to  Procedure(s): RIGHT/LEFT HEART CATH AND CORONARY ANGIOGRAPHY (N/A) as a surgical intervention.  The patient's history has been reviewed, patient examined, no change in status, stable for surgery.  I have reviewed the patient's chart and labs.  Questions were answered to the patient's satisfaction.     Bryan Lemma

## 2020-10-16 NOTE — Plan of Care (Signed)
  Problem: Education: Goal: Knowledge of General Education information will improve Description: Including pain rating scale, medication(s)/side effects and non-pharmacologic comfort measures Outcome: Progressing   Problem: Health Behavior/Discharge Planning: Goal: Ability to manage health-related needs will improve Outcome: Progressing   Problem: Clinical Measurements: Goal: Ability to maintain clinical measurements within normal limits will improve Outcome: Progressing Goal: Will remain free from infection Outcome: Progressing   Problem: Nutrition: Goal: Adequate nutrition will be maintained Outcome: Progressing   Problem: Pain Managment: Goal: General experience of comfort will improve Outcome: Progressing   

## 2020-10-16 NOTE — Progress Notes (Addendum)
Progress Note  Patient Name: Nathan [name] Date of Encounter: 10/16/2020  [facility] Cardiologist: Chilton Si, MD   Subjective   Short of breath improved with IV lasix. He slept better, no cp.  Inpatient Medications    Scheduled Meds:  aspirin EC  81 mg Oral Daily   azithromycin  500 mg Oral Daily   docusate sodium  100 mg Oral BID   enoxaparin (LOVENOX) injection  40 mg Subcutaneous Q24H   feeding supplement  237 mL Oral BID BM   furosemide  40 mg Intravenous BID   loratadine  10 mg Oral Daily   montelukast  10 mg Oral Daily   multivitamin with minerals  1 tablet Oral Daily   pantoprazole  40 mg Oral Daily   potassium chloride  20 mEq Oral BID   predniSONE  40 mg Oral Q breakfast   sodium chloride flush  3 mL Intravenous Q12H   sodium chloride flush  3 mL Intravenous Q12H   tamsulosin  0.8 mg Oral QODAY   tamsulosin  0.8 mg Oral Daily   Continuous Infusions:  sodium chloride     sodium chloride     sodium chloride 10 mL/hr at 10/16/20 0505   cefTRIAXone (ROCEPHIN)  IV 2 g (10/15/20 1524)   PRN Meds: sodium chloride, sodium chloride, acetaminophen **OR** acetaminophen, albuterol, methocarbamol, morphine injection, nitroGLYCERIN, ondansetron **OR** ondansetron (ZOFRAN) IV, oxyCODONE-acetaminophen, polyethylene glycol, sodium chloride flush, sodium chloride flush   Vital Signs    Vitals:   10/16/20 0019 10/16/20 0402 10/16/20 0815 10/16/20 1105  BP: 116/83 110/85 120/86 113/82  Pulse: (!) 104 (!) 107 (!) 116 (!) 109  Resp: 18 18 16  (!) 26  Temp: 97.6 F (36.4 C) 97.7 F (36.5 C) 98 F (36.7 C) 98 F (36.7 C)  TempSrc: Oral Oral Oral Oral  SpO2: 95% 99% 100% 98%  Weight:  101.1 kg    Height:        Intake/Output Summary (Last 24 hours) at 10/16/2020 1120 Last data filed at 10/16/2020 1101 Gross per 24 hour  Intake 3.44 ml  Output 1450 ml  Net -1446.56 ml   Last 3 Weights 10/16/2020 10/15/2020 10/14/2020  Weight (lbs) 222 lb 14.2 oz 227 lb 1.2  oz 224 lb 10.4 oz  Weight (kg) 101.1 kg 103 kg 101.9 kg      Telemetry    10 beats of ventricular tachycardia were noted at 3:55 AM, nursing notified. Currently sinus taCHYCARDIA - Personally Reviewed  ECG    No new- Personally Reviewed  Physical Exam   GEN: No acute distress.   Neck: No JVD Cardiac: RRR, no murmurs, rubs, or gallops.  Respiratory: Clear to auscultation bilaterally. GI: Soft, nontender, non-distended  MS: No edema; No deformity. Neuro:  Nonfocal  Psych: Normal affect   Labs    High Sensitivity Troponin:   Recent Labs  Lab 10/12/20 1424 10/13/20 0237 10/13/20 0524 10/14/20 0549 10/14/20 0820  TROPONINIHS 164* 154* 193* 129* 120*      Chemistry Recent Labs  Lab 10/12/20 1252 10/13/20 0237 10/14/20 0327 10/15/20 1028 10/16/20 0343  NA  --  136 136 134* 134*  K  --  3.7 3.7 4.1 4.8  CL  --  107 106 101 103  CO2  --  21* 21* 24 23  GLUCOSE  --  103* 102* 102* 114*  BUN  --  13 14 13 20   CREATININE  --  1.64* 1.55* 1.42* 1.44*  CALCIUM  --  8.6* 8.7* 9.1 9.2  PROT 7.3 6.2*  --   --   --   ALBUMIN 3.3* 2.8*  --   --   --   AST 30 25  --   --   --   ALT 19 16  --   --   --   ALKPHOS 73 63  --   --   --   BILITOT 1.1 1.1  --   --   --   GFRNONAA  --  50* 53* 59* 58*  ANIONGAP  --  8 9 9 8      Hematology Recent Labs  Lab 10/13/20 0237 10/14/20 0327 10/16/20 0343  WBC 5.0 5.9 5.7  RBC 4.04* 4.07* 4.14*  HGB 11.2* 11.1* 11.4*  HCT 34.6* 34.9* 34.9*  MCV 85.6 85.7 84.3  MCH 27.7 27.3 27.5  MCHC 32.4 31.8 32.7  RDW 15.1 15.2 14.9  PLT 211 216 243    BNP Recent Labs  Lab 10/12/20 1253  BNP 558.3*     DDimer  Recent Labs  Lab 10/12/20 1252  DDIMER 1.68*     Radiology    No results found.  Cardiac Studies   Echocardiogram 10/13/2020:    1. Left ventricular ejection fraction, by estimation, is 30 to 35%. The  left ventricle has moderately decreased function. The left ventricle  demonstrates global hypokinesis. The  left ventricular internal cavity size  was severely dilated. Left  ventricular diastolic parameters are indeterminate.   2. Right ventricular systolic function is normal. The right ventricular  size is normal. There is moderately elevated pulmonary artery systolic  pressure.   3. Left atrial size was moderately dilated.   4. The mitral valve is normal in structure. Severe mitral valve  regurgitation. No evidence of mitral stenosis.   5. The aortic valve is tricuspid. Aortic valve regurgitation is trivial.  Mild aortic valve sclerosis is present, with no evidence of aortic valve  stenosis.   6. The inferior vena cava is normal in size with greater than 50%  respiratory variability, suggesting right atrial pressure of 3 mmHg.   Patient Profile     55 y.o. 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  Worsening shortness of breath over the past 4 weeks.  Assessment & Plan    Shortness of breath, pulmonary sarcoid, acute systolic heart failure.  - Yesterday suspicion was volume overload because of S3 gallop.  No significant leg edema or rales noted on exam.  Coughing up pink frothy sputum from pulmonary edema.  He was given Lasix 40 mg IV yesterday with potassium.  Watching his basic metabolic profile. On lasix 40 BID IV. Improved symptoms.  -Awaiting cardiac MRI to help diagnose cardiac sarcoid, Thursday -Awaiting right and left heart catheterization for consideration of potential mitral valve repair. This is for today 2:30.  Mitral valve regurgitation-severe - Likely secondary to dilated left ventricle in the setting of cardiomyopathy.  Make sense to further diuresis and to reassess.  Elevated troponin - Demand ischemia in the setting of systolic heart failure     For questions or updates, please contact [facility] Please consult [email or web address]  for contact info under        Signed, Sunday, MD  10/16/2020, 11:20 AM  '

## 2020-10-16 NOTE — Assessment & Plan Note (Signed)
Bilateral mediastinal and hilar lymphadenopathy.  Suspect either sarcoidosis or lymphoproliferative disorder.  Sarcoidosis is more likely given history of chorioretinal inflammation 3 years ago for which he was treated with methotrexate for 6 months.  He self discontinued this when he lost insurance.   He may have a community-acquired pneumonia but the size of the lymph nodes is out of proportion. ACE level of 41 was not helpful  Continue ceftriaxone, azithromycin x 5 ds, although I doubt infection Supplemental oxygen Seems like he will need biopsy of mediastinal lymph nodes either EBUS guided TB NA or mediastinoscopy (more invasive).  He seems to be acutely ill with life-threatening cardiac involvement hence we will start treating for sarcoidosis empirically with oral prednisone 40 mg.  Clearly cardiac evaluation takes precedence over bronchoscopy and it is likely that this procedure may be delayed.  Ideally would like systolic heart failure to be compensated before proceeding with such procedure and meds to be optimized

## 2020-10-16 NOTE — Assessment & Plan Note (Addendum)
Improving. The patient is currently saturating 100% on 2L [place]. Monitor and wean as possible.

## 2020-10-16 NOTE — Progress Notes (Signed)
PROGRESS NOTE  Nathan [name] EZM:[id number] DOB: May 24, 1965 DOA: 10/12/2020 PCP: Dois Davenport, MD  Brief History   Nathan [name] is a 55 y.o. male with no significant past medical history presented to hospital with 3 to 4 weeks of shortness of breath and chest discomfort for 3 to 4 weeks.    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  Chest x-ray was done which showed some hilar lymphadenopathy.  Was initially noted to be hypoxic with pulse ox of 87% on room air.  Patient was also noted to be tachycardic.  Patient was then transferred to the ED for further evaluation.  In the ED, patient was noted to have elevated BNP of 558.  D- dimer was elevated at 1.6, so CT angiogram of the chest was performed which showed a hilar and midsternal lymphadenopathy, pleural effusion.  COVID test and influenza was negative.  Creatinine was elevated at 1.5.  Chest x-ray showed patchy interstitial and hazy airspace opacities suggestive of multifocal pneumonia.  Patient also received Rocephin and Zithromax and 20 mg of Lasix in the ED.  Patient was then admitted hospital for further evaluation and treatment.   The patient will undergo right and left heart catheterization today for evaluation for mitral valve repair.   Consultants  PCCM Cardiology  Procedures  Right and left heart catheterization  Antibiotics   Anti-infectives (From admission, onward)    Start     Dose/Rate Route Frequency Ordered Stop   10/14/20 1700  [MAR Hold]  azithromycin (ZITHROMAX) tablet 500 mg        (MAR Hold since Wed 10/16/2020 at 1623.Hold Reason: Transfer to a Procedural area)   500 mg Oral Daily 10/14/20 1534     10/13/20 1600  [MAR Hold]  cefTRIAXone (ROCEPHIN) 2 g in sodium chloride 0.9 % 100 mL IVPB        (MAR Hold since Wed 10/16/2020 at 1623.Hold Reason: Transfer to a Procedural area)   2 g 200 mL/hr over 30 Minutes Intravenous  Every 24 hours 10/12/20 1737     10/12/20 1745  cefTRIAXone (ROCEPHIN) 1 g in sodium chloride 0.9 % 100 mL IVPB  Status:  Discontinued        1 g 200 mL/hr over 30 Minutes Intravenous  Once 10/12/20 1737 10/14/20 1532   10/12/20 1730  cefTRIAXone (ROCEPHIN) 1 g in sodium chloride 0.9 % 100 mL IVPB  Status:  Discontinued        1 g 200 mL/hr over 30 Minutes Intravenous Every 24 hours 10/12/20 1727 10/12/20 1735   10/12/20 1730  azithromycin (ZITHROMAX) 500 mg in sodium chloride 0.9 % 250 mL IVPB  Status:  Discontinued        500 mg 250 mL/hr over 60 Minutes Intravenous Every 24 hours 10/12/20 1727 10/14/20 1534   10/12/20 1615  cefTRIAXone (ROCEPHIN) 1 g in sodium chloride 0.9 % 100 mL IVPB        1 g 200 mL/hr over 30 Minutes Intravenous  Once 10/12/20 1608 10/12/20 1730   10/12/20 1615  azithromycin (ZITHROMAX) 500 mg in sodium chloride 0.9 % 250 mL IVPB  Status:  Discontinued        500 mg 250 mL/hr over 60 Minutes Intravenous  Once 10/12/20 1608 10/12/20 1736       Subjective  No new complaints.   Objective   Vitals:  Vitals:   10/16/20 1105  10/16/20 1514  BP: 113/82 101/76  Pulse: (!) 109 (!) 113  Resp: (!) 26 (!) 27  Temp: 98 F (36.7 C) 97.8 F (36.6 C)  SpO2: 98% 100%    Exam:  Constitutional:  Patient is awake, alert, and oriented x 3. No acute distress. Respiratory:  CTA bilaterally, no w/r/r.  Respiratory effort normal. No retractions or accessory muscle use Cardiovascular:  RRR, no m/r/g No LE extremity edema   Normal pedal pulses Abdomen:  Abdomen appears normal; no tenderness or masses No hernias No HSM Musculoskeletal:  Digits/nails BUE: no clubbing, cyanosis, petechiae, infection exam of joints, bones, muscles of at least one of following: head/neck, RUE, LUE, RLE, LLE   No tenderness, masses Skin:  No rashes, lesions, ulcers palpation of skin: no induration or nodules Neurologic:  CN 2-12 intact Sensation all 4 extremities  intact Psychiatric:  Mental status awake and alert. Mood, affect congruent Orientation to person, place, time    I have personally reviewed the following:   Today's Data  CBC, BMP, Creatinine 1.44  Imaging  CTA chest with contrast.  Cardiology Data  EKG Echocardiogram  Scheduled Meds:  aspirin EC  81 mg Oral Daily   azithromycin  500 mg Oral Daily   docusate sodium  100 mg Oral BID   enoxaparin (LOVENOX) injection  40 mg Subcutaneous Q24H   feeding supplement  237 mL Oral BID BM   furosemide  40 mg Intravenous BID   loratadine  10 mg Oral Daily   montelukast  10 mg Oral Daily   multivitamin with minerals  1 tablet Oral Daily   pantoprazole  40 mg Oral Daily   potassium chloride  20 mEq Oral BID   predniSONE  40 mg Oral Q breakfast   sodium chloride flush  3 mL Intravenous Q12H   sodium chloride flush  3 mL Intravenous Q12H   tamsulosin  0.8 mg Oral QODAY   tamsulosin  0.8 mg Oral Daily   Continuous Infusions:  sodium chloride     sodium chloride     sodium chloride 10 mL/hr at 10/16/20 0505   cefTRIAXone (ROCEPHIN)  IV 2 g (10/15/20 1524)    Principal Problem:   Dyspnea Active Problems:   Shortness of breath   Mediastinal lymphadenopathy   Pneumonia   Chest pain   Acute combined systolic and diastolic heart failure (HCC)   Sarcoidosis   Severe mitral regurgitation   Pleural effusion   LOS: 4 days   A & P  Thoracic lymphadenopathy Bilateral mediastinal and hilar lymphadenopathy.  Suspect either sarcoidosis or lymphoproliferative disorder.  Sarcoidosis is more likely given history of chorioretinal inflammation 3 years ago for which he was treated with methotrexate for 6 months.  He self discontinued this when he lost insurance.   He may have a community-acquired pneumonia but the size of the lymph nodes is out of proportion. ACE level of 41 was not helpful   Continue ceftriaxone, azithromycin x 5 ds, although I doubt infection Supplemental  oxygen Seems like he will need biopsy of mediastinal lymph nodes either EBUS guided TB NA or mediastinoscopy (more invasive).  He seems to be acutely ill with life-threatening cardiac involvement hence we will start treating for sarcoidosis empirically with oral prednisone 40 mg.  Clearly cardiac evaluation takes precedence over bronchoscopy and it is likely that this procedure may be delayed.  Ideally would like systolic heart failure to be compensated before proceeding with such procedure and meds to be optimized  Shortness  of breath Improving. The patient is currently saturating 100% on 2L [place]. Monitor and wean as possible.  Severe mitral regurgitation Pt to undergo left and right cardiac catheterization today to evaluate for consideration of possible mitral valve repair. Diurese and evaluate for the present time.  Sarcoidosis Patient with known ocular sarcoidosis. Patient was forced to discontinue therapy when he lost insurance. . The patient will undergo a cardiac MRI to evaluate for cardiac sarcoidosis. Prednisone started.  Pneumonia Upon presentation the patient demonstrated bilateral patchy infiltrates most prominent in the right upper lobe. The patient will complete treatment for community acquired pneumonia today. However per pulmonary the lymphadenopathy is out of scale for pneumonia. Testing for influenza and COVID-19 are negative.  Pleural effusion Treated effectively with diuresis. Monitor.  Chest pain Chest x-ray with multifocal pneumonia.  Continue with Rocephin and Zithromax for now.  Ruled out for MI by EKG and enzyme criteria. CT scan shows bilateral hilar and mediastinal lymphadenopathy with pleural effusion.  Possibility of lymphoproliferative disorder/sarcoidosis.  HIV nonreactive.  Pulmonary on board.  Continue with nebulizer, supportive care, incentive spirometry.  Cardiac MRI has been planned for or 10/17/2020  Acute combined systolic and diastolic heart failure  (HCC) Echocardiogram performed on 10/13/2020 demonstrated EF of 30-35% with global hypokinesis with a severely dilated LV cavity. There were indeterminate diastolic parameters.    Inpatient status I have seen and examined this patient myself. I have spent 34 minutes in his evaluation and care.  DVT prophylaxis: Lovenox Code Status: Full Family Communication: None available Disposition Plan: tbd    Shaday Rayborn, DO [facility] Direct contact: see [email or web address]  7PM-7AM contact night coverage as above 10/16/2020, 3:50 PM  LOS: 4 days

## 2020-10-16 NOTE — Progress Notes (Signed)
Pt w/ 10 beats Vtach RVR at 0355, pt asymptomatic. VS wnl. Dr. Rachael Darby informed.

## 2020-10-16 NOTE — Assessment & Plan Note (Addendum)
Echocardiogram performed on 10/13/2020 demonstrated EF of 30-35% with global hypokinesis with a severely dilated LV cavity. There were indeterminate diastolic parameters.

## 2020-10-17 ENCOUNTER — Inpatient Hospital Stay (HOSPITAL_COMMUNITY): Payer: 59

## 2020-10-17 ENCOUNTER — Telehealth: Payer: Self-pay | Admitting: Pulmonary Disease

## 2020-10-17 ENCOUNTER — Encounter (HOSPITAL_COMMUNITY): Payer: Self-pay | Admitting: Cardiology

## 2020-10-17 DIAGNOSIS — D8685 Sarcoid myocarditis: Secondary | ICD-10-CM

## 2020-10-17 DIAGNOSIS — R0602 Shortness of breath: Secondary | ICD-10-CM | POA: Diagnosis not present

## 2020-10-17 LAB — BASIC METABOLIC PANEL
Anion gap: 8 (ref 5–15)
BUN: 22 mg/dL — ABNORMAL HIGH (ref 6–20)
CO2: 23 mmol/L (ref 22–32)
Calcium: 9.1 mg/dL (ref 8.9–10.3)
Chloride: 106 mmol/L (ref 98–111)
Creatinine, Ser: 1.58 mg/dL — ABNORMAL HIGH (ref 0.61–1.24)
GFR, Estimated: 52 mL/min — ABNORMAL LOW (ref >60)
Glucose, Bld: 108 mg/dL — ABNORMAL HIGH (ref 70–99)
Potassium: 4.2 mmol/L (ref 3.5–5.1)
Sodium: 137 mmol/L (ref 135–145)

## 2020-10-17 LAB — CBC WITH DIFFERENTIAL/PLATELET
Abs Immature Granulocytes: 0.04 10*3/uL (ref 0.00–0.07)
Basophils Absolute: 0 10*3/uL (ref 0.0–0.1)
Basophils Relative: 0 %
Eosinophils Absolute: 0 10*3/uL (ref 0.0–0.5)
Eosinophils Relative: 0 %
HCT: 36.3 % — ABNORMAL LOW (ref 39.0–52.0)
Hemoglobin: 11.6 g/dL — ABNORMAL LOW (ref 13.0–17.0)
Immature Granulocytes: 1 %
Lymphocytes Relative: 13 %
Lymphs Abs: 1 10*3/uL (ref 0.7–4.0)
MCH: 27.4 pg (ref 26.0–34.0)
MCHC: 32 g/dL (ref 30.0–36.0)
MCV: 85.6 fL (ref 80.0–100.0)
Monocytes Absolute: 1 10*3/uL (ref 0.1–1.0)
Monocytes Relative: 14 %
Neutro Abs: 5.3 10*3/uL (ref 1.7–7.7)
Neutrophils Relative %: 72 %
Platelets: 360 10*3/uL (ref 150–400)
RBC: 4.24 MIL/uL (ref 4.22–5.81)
RDW: 15.2 % (ref 11.5–15.5)
WBC: 7.4 10*3/uL (ref 4.0–10.5)
nRBC: 0 % (ref 0.0–0.2)

## 2020-10-17 MED ORDER — FUROSEMIDE 40 MG PO TABS
40.0000 mg | ORAL_TABLET | Freq: Two times a day (BID) | ORAL | Status: DC
  Administered 2020-10-17 – 2020-10-18 (×2): 40 mg via ORAL
  Filled 2020-10-17 (×2): qty 1

## 2020-10-17 MED ORDER — GADOBUTROL 1 MMOL/ML IV SOLN
10.0000 mL | Freq: Once | INTRAVENOUS | Status: AC | PRN
  Administered 2020-10-17: 10 mL via INTRAVENOUS

## 2020-10-17 NOTE — Progress Notes (Signed)
Progress Note  Patient Name: Nathan [name] Date of Encounter: 10/17/2020  Primary Cardiologist:   Chilton Si, MD   Subjective   No pain and breathing OK.    Inpatient Medications    Scheduled Meds:  aspirin EC  81 mg Oral Daily   docusate sodium  100 mg Oral BID   enoxaparin (LOVENOX) injection  40 mg Subcutaneous Q24H   feeding supplement  237 mL Oral BID BM   furosemide  40 mg Intravenous BID   loratadine  10 mg Oral Daily   montelukast  10 mg Oral Daily   multivitamin with minerals  1 tablet Oral Daily   pantoprazole  40 mg Oral Daily   potassium chloride  20 mEq Oral BID   predniSONE  40 mg Oral Q breakfast   sodium chloride flush  3 mL Intravenous Q12H   sodium chloride flush  3 mL Intravenous Q12H   sodium chloride flush  3 mL Intravenous Q12H   tamsulosin  0.8 mg Oral QODAY   Continuous Infusions:  sodium chloride     sodium chloride     cefTRIAXone (ROCEPHIN)  IV 2 g (10/15/20 1524)   PRN Meds: sodium chloride, sodium chloride, acetaminophen **OR** acetaminophen, albuterol, methocarbamol, morphine injection, nitroGLYCERIN, ondansetron **OR** ondansetron (ZOFRAN) IV, oxyCODONE-acetaminophen, polyethylene glycol, sodium chloride flush, sodium chloride flush   Vital Signs    Vitals:   10/16/20 2257 10/16/20 2318 10/17/20 0334 10/17/20 0929  BP: 97/64 93/66 100/73 103/77  Pulse: 100 99 96   Resp: 20 18 16 14   Temp:  98.5 F (36.9 C) 97.6 F (36.4 C) 97.6 F (36.4 C)  TempSrc:  Oral Oral Oral  SpO2: 95% 96% 91% 96%  Weight:   101 kg   Height:        Intake/Output Summary (Last 24 hours) at 10/17/2020 1130 Last data filed at 10/17/2020 0416 Gross per 24 hour  Intake --  Output 1700 ml  Net -1700 ml   Filed Weights   10/15/20 0402 10/16/20 0402 10/17/20 0334  Weight: 103 kg 101.1 kg 101 kg    Telemetry    NSR, sinus tach with PVCs - Personally Reviewed  ECG    NA - Personally Reviewed  Physical Exam   GEN: No acute distress.    Neck: No  JVD Cardiac: RRR, no murmurs, rubs, or gallops.  Respiratory: Clear  to auscultation bilaterally. GI: Soft, nontender, non-distended  MS: No  edema; No deformity. Neuro:  Nonfocal  Psych: Normal affect   Labs    Chemistry Recent Labs  Lab 10/12/20 1252 10/13/20 0237 10/14/20 0327 10/15/20 1028 10/16/20 0343 10/16/20 1655 10/16/20 1703 10/16/20 1704 10/17/20 0231  NA  --  136   < > 134* 134*   < > 138 139 137  K  --  3.7   < > 4.1 4.8   < > 4.4 4.3 4.2  CL  --  107   < > 101 103  --   --   --  106  CO2  --  21*   < > 24 23  --   --   --  23  GLUCOSE  --  103*   < > 102* 114*  --   --   --  108*  BUN  --  13   < > 13 20  --   --   --  22*  CREATININE  --  1.64*   < > 1.42* 1.44*  --   --   --  1.58*  CALCIUM  --  8.6*   < > 9.1 9.2  --   --   --  9.1  PROT 7.3 6.2*  --   --   --   --   --   --   --   ALBUMIN 3.3* 2.8*  --   --   --   --   --   --   --   AST 30 25  --   --   --   --   --   --   --   ALT 19 16  --   --   --   --   --   --   --   ALKPHOS 73 63  --   --   --   --   --   --   --   BILITOT 1.1 1.1  --   --   --   --   --   --   --   GFRNONAA  --  50*   < > 59* 58*  --   --   --  52*  ANIONGAP  --  8   < > 9 8  --   --   --  8   < > = values in this interval not displayed.     Hematology Recent Labs  Lab 10/14/20 0327 10/16/20 0343 10/16/20 1655 10/16/20 1703 10/16/20 1704 10/17/20 0231  WBC 5.9 5.7  --   --   --  7.4  RBC 4.07* 4.14*  --   --   --  4.24  HGB 11.1* 11.4*   < > 12.2* 12.2* 11.6*  HCT 34.9* 34.9*   < > 36.0* 36.0* 36.3*  MCV 85.7 84.3  --   --   --  85.6  MCH 27.3 27.5  --   --   --  27.4  MCHC 31.8 32.7  --   --   --  32.0  RDW 15.2 14.9  --   --   --  15.2  PLT 216 243  --   --   --  360   < > = values in this interval not displayed.    Cardiac EnzymesNo results for input(s): TROPONINI in the last 168 hours. No results for input(s): TROPIPOC in the last 168 hours.   BNP Recent Labs  Lab 10/12/20 1253  BNP 558.3*      DDimer  Recent Labs  Lab 10/12/20 1252  DDIMER 1.68*     Radiology    CARDIAC CATHETERIZATION  Result Date: 10/16/2020   Hemodynamic findings consistent with mild pulmonary hypertension.   Estimated moderate-severe MR with 3-4+ based on size of V wave on PCWP tracing.   Angiographically Normal Coronary Arteries SUMMARY Angiographically normal coronary arteries Mild to moderate Pulmonary Hypertension secondary to Mitral Regurgitation, and elevated LVEDP from left-sided failure. PAP 46/19 mm with a mean of 33 mmHg.  P CWP 27 mmHg and LVEDP 30 mmHg.  In the setting of systolic pressures 90/20 mmHg Cardiac Output (Fick) 6.75-3.06.-Index SVR 10.96, PVR 0.9 (Wood units) RECOMMENDATIONS Transfer to cardiac telemetry unit for continued management.  Was given 40 mg IV Lasix in the Cath Lab, defer further diuresis to cardiology service. Bryan Lemma, MD   Cardiac Studies   MRI:  Results pending.    Cardiac cath (right heart pressures) Flowsheet Row Most Recent Value  Fick Cardiac Output 6.75 L/min  Fick Cardiac Output Index 3.06 (L/min)/BSA  RA A Wave 9 mmHg  RA V Wave 5 mmHg  RA Mean 5 mmHg  RV Systolic Pressure 43 mmHg  RV Diastolic Pressure 4 mmHg  RV EDP 7 mmHg  PA Systolic Pressure 46 mmHg  PA Diastolic Pressure 19 mmHg  PA Mean 33 mmHg  PW A Wave 28 mmHg  PW V Wave 27 mmHg  PW Mean 27 mmHg  AO Systolic Pressure 89 mmHg  AO Diastolic Pressure 70 mmHg  AO Mean 79 mmHg  LV Systolic Pressure 93 mmHg  LV Diastolic Pressure 20 mmHg  LV EDP 31 mmHg  AOp Systolic Pressure 89 mmHg  AOp Diastolic Pressure 70 mmHg  AOp Mean Pressure 79 mmHg  LVp Systolic Pressure 92 mmHg  LVp Diastolic Pressure 17 mmHg  LVp EDP Pressure 29 mmHg  QP/QS 1  TPVR Index 10.77 HRUI  TSVR Index 25.78 HRUI  PVR SVR Ratio 0.08  TPVR/TSVR Ratio 0.42    Patient Profile     55 y.o. 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  Worsening shortness of breath over the past 4 weeks.  Assessment & Plan   Acute systolic and diastolic HF:   Normal coronaries.  Right heart pressures as above.  MRI done with results pending to evaluate for possible sarcoid cardiac involvement given his lung disease.   BP will not allow med titration.   Adenopathy:  Plan for bronch/EBUS possibly tomorrow.  OK from a cardiac standpoint for this.    CKD IIIA:  Net negative 4.6 liters. He seems to be euvolemic and is progressively more tachycardic. BP is low.  I will switch to PO Lasix.  Might be able to decrease the dose in the AM.   MR:    Medical management for now pending competed work up.     Elevated troponin:  NL coronaries.  No further work up.     For questions or updates, please contact [facility] Please consult [email or web address] for contact info under Cardiology/STEMI.   Signed, Rollene Rotunda, MD  10/17/2020, 11:30 AM

## 2020-10-17 NOTE — Telephone Encounter (Signed)
Please double book at 9:45am on 9/21. Thanks  Nathan [name]

## 2020-10-17 NOTE — Telephone Encounter (Signed)
No available appt within 14 days with you or alva. Would you like to have NP see the patient or double book appts?

## 2020-10-17 NOTE — Telephone Encounter (Signed)
Please schedule for hospital follow up in 7-14 days with myself or Dr. Vassie Loll for mediastinal adenopathy/sarcoidosis.  Thanks, Cletis Athens

## 2020-10-17 NOTE — Progress Notes (Signed)
NAME:  Nathan [name], MRN:  [id number], DOB:  1965-06-22, LOS: 5 ADMISSION DATE:  10/12/2020, CONSULTATION DATE:  10/12/2020 REFERRING MD:  Joycelyn Das MD, CHIEF COMPLAINT:  Abnormal CT scan   History of Present Illness:  55 Y/O with remote smoking history, peripheral focal chorioretinal inflammation eyes  Admitted with dyspnea, chest discomfort.  CT scan on admission significant for small bilateral pleural effusion, mediastinal lymphadenopathy Found to have EF 30 to 35% with severe MR  Pertinent  Medical History    has a past medical history of Allergies, BPH (benign prostatic hyperplasia), ED (erectile dysfunction), GERD (gastroesophageal reflux disease), Peripheral focal chorioretinal inflammation of both eyes, and Retinal vasculitis of both eyes.  He has history of peripheral focal chorioretinal inflammation of both eyes diagnosed in 2020.  Followed at [facility] ophthalmology.  He was treated with methotrexate and folic acid but self discontinued after 6 months in 2021.  He has not followed up with his ophthalmologist.  Remote smoker in his 20 to 30s  Has a [employer] company.  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  No pets  Significant Hospital Events: Including procedures, antibiotic start and stop dates in addition to other pertinent events   9/10 admit  Interim History / Subjective:   Breathing is better today. LHC/RHC went well yesterday, no coronary artery disease. Cleared by cardiology for procedure tomorrow.  Objective   Blood pressure 103/77, pulse 96, temperature 97.6 F (36.4 C), temperature source Oral, resp. rate 14, height 5\' 10"  (1.778 m), weight 101 kg, SpO2 96 %.        Intake/Output Summary (Last 24 hours) at 10/17/2020 1009 Last data filed at 10/17/2020 0416 Gross per 24 hour  Intake --  Output 1900 ml  Net -1900 ml   Filed Weights   10/15/20 0402 10/16/20 0402 10/17/20 0334  Weight: 103 kg 101.1 kg 101 kg     Examination: Gen:      No acute distress, obese male HEENT:  EOMI, sclera anicteric, moist mucous memranes Lungs:    No accessory muscle use, clear lungs, mild crackles left base CV:         S3 gallop present, regular heart sounds Abd:      + bowel sounds; soft, non-tender; no palpable masses, no distension Ext:    No edema, warm Skin:      Warm and dry; no rash Neuro:  alert and oriented x 3   CT significant for bulky mediastinal, bilateral hilar lymphadenopathy, groundglass opacity bilaterally with bilateral pleural effusion.  Resolved Hospital Problem list     Assessment & Plan:  Bilateral mediastinal and hilar lymphadenopathy Suspect either sarcoidosis vs lymphoproliferative disorder vs infection vs reactive LN in heart failure.  Sarcoidosis is of concern given history of chorioretinal inflammation 3 years ago for which he was treated with methotrexate for 6 months (self discontinued when he lost insurance) and new onset non-ischemic heart failure.  -Ceftriaxone and azithromycin 5 day course to be completed for concern of CAP -Has been weaned off supplemental oxygen -Will plan for bronchoscopy/EBUS tomorrow with Dr. 10/19/20. Patient to be NPO after mid-night tonight. - Started 40mg  prednisone daily empirically for concern of sarcoidosis on 9/13 (risks/benefits discussed with patient and family by Dr. )  Elevated troponin, BNP Bilateral effusions > improved on chest x ray New diagnosis of cardiomyopathy/acute systolic heart failure/severe MR Effusions likely related to heart failure.   Appreciate cardiology evaluation. Heart cath showed pulmonary hypertension secondary  to mitral regurgitation. Normal coronaries.  Follow up cardiac MRI results Lasix per cardiology and monitoring of renal function  Signature:   Melody Comas, MD [facility] Office: [phone]   See Amion for personal pager PCCM on call pager [phone] until 7pm. Please  call Elink 7p-7a. [phone]

## 2020-10-17 NOTE — Progress Notes (Signed)
Mobility Specialist Progress Note    10/17/20 1516  Mobility  Activity Ambulated in hall  Level of Assistance Independent  Assistive Device None  Distance Ambulated (ft) 980 ft  Mobility Ambulated independently in hallway  Mobility Response Tolerated well  Mobility performed by Mobility specialist  Bed Position Chair  $Mobility charge 1 Mobility   Pre-Mobility: 123 HR, 124/88 BP During Mobility: 136 HR Post-Mobility: 120 HR, 132/88 BP  Pt found in chair and agreeable to ambulation. Asx throughout and returned to chair with call bell in reach.    [facility] Mobility Specialist  Mobility Specialist Phone: [phone]

## 2020-10-17 NOTE — Telephone Encounter (Signed)
Appt made.   Nothing further needed at this time.  

## 2020-10-17 NOTE — H&P (View-Only) (Signed)
NAME:  Nathan [name], MRN:  [id number], DOB:  1965-06-22, LOS: 5 ADMISSION DATE:  10/12/2020, CONSULTATION DATE:  10/12/2020 REFERRING MD:  Joycelyn Das MD, CHIEF COMPLAINT:  Abnormal CT scan   History of Present Illness:  55 Y/O with remote smoking history, peripheral focal chorioretinal inflammation eyes  Admitted with dyspnea, chest discomfort.  CT scan on admission significant for small bilateral pleural effusion, mediastinal lymphadenopathy Found to have EF 30 to 35% with severe MR  Pertinent  Medical History    has a past medical history of Allergies, BPH (benign prostatic hyperplasia), ED (erectile dysfunction), GERD (gastroesophageal reflux disease), Peripheral focal chorioretinal inflammation of both eyes, and Retinal vasculitis of both eyes.  He has history of peripheral focal chorioretinal inflammation of both eyes diagnosed in 2020.  Followed at [facility] ophthalmology.  He was treated with methotrexate and folic acid but self discontinued after 6 months in 2021.  He has not followed up with his ophthalmologist.  Remote smoker in his 20 to 30s  Has a [employer] company.  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  No pets  Significant Hospital Events: Including procedures, antibiotic start and stop dates in addition to other pertinent events   9/10 admit  Interim History / Subjective:   Breathing is better today. LHC/RHC went well yesterday, no coronary artery disease. Cleared by cardiology for procedure tomorrow.  Objective   Blood pressure 103/77, pulse 96, temperature 97.6 F (36.4 C), temperature source Oral, resp. rate 14, height 5\' 10"  (1.778 m), weight 101 kg, SpO2 96 %.        Intake/Output Summary (Last 24 hours) at 10/17/2020 1009 Last data filed at 10/17/2020 0416 Gross per 24 hour  Intake --  Output 1900 ml  Net -1900 ml   Filed Weights   10/15/20 0402 10/16/20 0402 10/17/20 0334  Weight: 103 kg 101.1 kg 101 kg     Examination: Gen:      No acute distress, obese male HEENT:  EOMI, sclera anicteric, moist mucous memranes Lungs:    No accessory muscle use, clear lungs, mild crackles left base CV:         S3 gallop present, regular heart sounds Abd:      + bowel sounds; soft, non-tender; no palpable masses, no distension Ext:    No edema, warm Skin:      Warm and dry; no rash Neuro:  alert and oriented x 3   CT significant for bulky mediastinal, bilateral hilar lymphadenopathy, groundglass opacity bilaterally with bilateral pleural effusion.  Resolved Hospital Problem list     Assessment & Plan:  Bilateral mediastinal and hilar lymphadenopathy Suspect either sarcoidosis vs lymphoproliferative disorder vs infection vs reactive LN in heart failure.  Sarcoidosis is of concern given history of chorioretinal inflammation 3 years ago for which he was treated with methotrexate for 6 months (self discontinued when he lost insurance) and new onset non-ischemic heart failure.  -Ceftriaxone and azithromycin 5 day course to be completed for concern of CAP -Has been weaned off supplemental oxygen -Will plan for bronchoscopy/EBUS tomorrow with Dr. 10/19/20. Patient to be NPO after mid-night tonight. - Started 40mg  prednisone daily empirically for concern of sarcoidosis on 9/13 (risks/benefits discussed with patient and family by Dr. )  Elevated troponin, BNP Bilateral effusions > improved on chest x ray New diagnosis of cardiomyopathy/acute systolic heart failure/severe MR Effusions likely related to heart failure.   Appreciate cardiology evaluation. Heart cath showed pulmonary hypertension secondary  to mitral regurgitation. Normal coronaries.  Follow up cardiac MRI results Lasix per cardiology and monitoring of renal function  Signature:   Melody Comas, MD [facility] Office: [phone]   See Amion for personal pager PCCM on call pager [phone] until 7pm. Please  call Elink 7p-7a. [phone]

## 2020-10-17 NOTE — Progress Notes (Signed)
PROGRESS NOTE  Nathan [name] QPR:[id number] DOB: 1965-09-06 DOA: 10/12/2020 PCP: Dois Davenport, MD  Brief History   Nathan [name] is a 55 y.o. male with no significant past medical history presented to hospital with 3 to 4 weeks of shortness of breath and chest discomfort for 3 to 4 weeks.    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  Chest x-ray was done which showed some hilar lymphadenopathy.  Was initially noted to be hypoxic with pulse ox of 87% on room air.  Patient was also noted to be tachycardic.  Patient was then transferred to the ED for further evaluation.  In the ED, patient was noted to have elevated BNP of 558.  D- dimer was elevated at 1.6, so CT angiogram of the chest was performed which showed a hilar and midsternal lymphadenopathy, pleural effusion.  COVID test and influenza was negative.  Creatinine was elevated at 1.5.  Chest x-ray showed patchy interstitial and hazy airspace opacities suggestive of multifocal pneumonia.  Patient also received Rocephin and Zithromax and 20 mg of Lasix in the ED.  Patient was then admitted hospital for further evaluation and treatment.   The patient underwent right and left heart catheterization on 10/16/2020 to evaluate MR. Plan now is for medical management.   Cardiac MRI performed this am to evaluate for cardiac sarcoidosis. Results pending.   Plan is for bronchoscopy/EBUS tomorrow. He has been cleared by cardiology for this procedure. Consultants  PCCM Cardiology  Procedures  Right and left heart catheterization  Antibiotics   Anti-infectives (From admission, onward)    Start     Dose/Rate Route Frequency Ordered Stop   10/14/20 1700  azithromycin (ZITHROMAX) tablet 500 mg        500 mg Oral Daily 10/14/20 1534 10/17/20 0950   10/13/20 1600  cefTRIAXone (ROCEPHIN) 2 g in sodium chloride 0.9 % 100 mL IVPB        2 g 200 mL/hr over 30  Minutes Intravenous Every 24 hours 10/12/20 1737 10/17/20 2359   10/12/20 1745  cefTRIAXone (ROCEPHIN) 1 g in sodium chloride 0.9 % 100 mL IVPB  Status:  Discontinued        1 g 200 mL/hr over 30 Minutes Intravenous  Once 10/12/20 1737 10/14/20 1532   10/12/20 1730  cefTRIAXone (ROCEPHIN) 1 g in sodium chloride 0.9 % 100 mL IVPB  Status:  Discontinued        1 g 200 mL/hr over 30 Minutes Intravenous Every 24 hours 10/12/20 1727 10/12/20 1735   10/12/20 1730  azithromycin (ZITHROMAX) 500 mg in sodium chloride 0.9 % 250 mL IVPB  Status:  Discontinued        500 mg 250 mL/hr over 60 Minutes Intravenous Every 24 hours 10/12/20 1727 10/14/20 1534   10/12/20 1615  cefTRIAXone (ROCEPHIN) 1 g in sodium chloride 0.9 % 100 mL IVPB        1 g 200 mL/hr over 30 Minutes Intravenous  Once 10/12/20 1608 10/12/20 1730   10/12/20 1615  azithromycin (ZITHROMAX) 500 mg in sodium chloride 0.9 % 250 mL IVPB  Status:  Discontinued        500 mg 250 mL/hr over 60 Minutes Intravenous  Once 10/12/20 1608 10/12/20 1736       Subjective  Pt is awake and alert. No new complaints. Family is at bedside.  Objective   Vitals:  Vitals:  10/17/20 0334 10/17/20 0929  BP: 100/73 103/77  Pulse: 96   Resp: 16 14  Temp: 97.6 F (36.4 C) 97.6 F (36.4 C)  SpO2: 91% 96%    Exam:  Constitutional:  Patient is awake, alert, and oriented x 3. No acute distress. Respiratory:  CTA bilaterally, no w/r/r.  Respiratory effort normal. No retractions or accessory muscle use Cardiovascular:  RRR, no m/r/g No LE extremity edema   Normal pedal pulses Abdomen:  Abdomen appears normal; no tenderness or masses No hernias No HSM Musculoskeletal:  Digits/nails BUE: no clubbing, cyanosis, petechiae, infection exam of joints, bones, muscles of at least one of following: head/neck, RUE, LUE, RLE, LLE   No tenderness, masses Skin:  No rashes, lesions, ulcers palpation of skin: no induration or nodules Neurologic:  CN  2-12 intact Sensation all 4 extremities intact Psychiatric:  Mental status awake and alert. Mood, affect congruent Orientation to person, place, time   I have personally reviewed the following:   Today's Data  CBC, BMP, Creatinine 1.58  Imaging  CTA chest with contrast. Cardiac MRI  Cardiology Data  EKG Echocardiogram  Scheduled Meds:  aspirin EC  81 mg Oral Daily   docusate sodium  100 mg Oral BID   enoxaparin (LOVENOX) injection  40 mg Subcutaneous Q24H   feeding supplement  237 mL Oral BID BM   furosemide  40 mg Oral BID   loratadine  10 mg Oral Daily   montelukast  10 mg Oral Daily   multivitamin with minerals  1 tablet Oral Daily   pantoprazole  40 mg Oral Daily   potassium chloride  20 mEq Oral BID   predniSONE  40 mg Oral Q breakfast   sodium chloride flush  3 mL Intravenous Q12H   sodium chloride flush  3 mL Intravenous Q12H   sodium chloride flush  3 mL Intravenous Q12H   tamsulosin  0.8 mg Oral QODAY   Continuous Infusions:  sodium chloride     sodium chloride     cefTRIAXone (ROCEPHIN)  IV 2 g (10/15/20 1524)    Principal Problem:   Shortness of breath Active Problems:   Pneumonia   Chest pain   Acute combined systolic and diastolic heart failure (HCC)   Sarcoidosis   Severe mitral regurgitation   Pleural effusion   Thoracic lymphadenopathy   Dilated cardiomyopathy (HCC)   LOS: 5 days   A & P  Thoracic lymphadenopathy Bilateral mediastinal and hilar lymphadenopathy. Bronchoscopy and EBUS tomorrow.   Suspect either sarcoidosis or lymphoproliferative disorder.  Sarcoidosis is more likely given history of chorioretinal inflammation 3 years ago for which he was treated with methotrexate for 6 months.  He self discontinued this when he lost insurance.  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 He may have a community-acquired pneumonia but the size of the lymph nodes is out of proportion. ACE level of 41 was not  helpful   Continue ceftriaxone, azithromycin x 5 ds, although I doubt infection. Last dose today. Patient is currently saturating 96% on room air. Seems like he will need biopsy of mediastinal lymph nodes either EBUS guided TB NA or mediastinoscopy (more invasive).  He seems to be acutely ill with life-threatening cardiac involvement hence we will start treating for sarcoidosis empirically with oral prednisone 40 mg.  The patient has been cleared by cardiology for bronchoscopy and EBUS tomorrow.   Shortness of breath Improving. The patient is currently saturating 96% on room air. Monitor and  wean as possible.  Severe mitral regurgitation Pt to underwent left and right cardiac catheterization on 10/16/2020 to evaluate for consideration of possible mitral valve repair. Cardiology is opting for medical management only at this point.  Sarcoidosis Patient with known ocular sarcoidosis. Patient was forced to discontinue therapy when he lost insurance. . The patient has undergone a cardiac MRI on 10/17/2020 evaluate for cardiac sarcoidosis. 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  Pneumonia Upon presentation the patient demonstrated bilateral patchy infiltrates most prominent in the right upper lobe. The patient will complete treatment for community acquired pneumonia today. However per pulmonary the lymphadenopathy is out of scale for pneumonia. Testing for influenza and COVID-19 are negative.  Pleural effusion Treated effectively with diuresis. Monitor.  Chest pain Resolved. Chest x-ray with multifocal pneumonia.  Continue with Rocephin and Zithromax through 10/17/2020.  Ruled out for MI by EKG and enzyme criteria. CT scan shows bilateral hilar and mediastinal lymphadenopathy with pleural effusion.  Possibility of lymphoproliferative disorder/sarcoidosis.  HIV nonreactive.  Pulmonary on board and planing for bronchoscopy with EBUS on 10/18/2020.  Continue with  nebulizer, supportive care, incentive spirometry.  Cardiac MRI has been completed on 10/17/2020.  Acute combined systolic and diastolic heart failure (HCC) Echocardiogram performed on 10/13/2020 demonstrated EF of 30-35% with global hypokinesis with a severely dilated LV cavity. There were indeterminate diastolic parameters.   Inpatient status I have seen and examined this patient myself. I have spent 36 minutes in his evaluation and care.  DVT prophylaxis: Lovenox Code Status: Full Family Communication: None available Disposition Plan: tbd    Sherlon Nied, DO [facility] Direct contact: see [email or web address]  7PM-7AM contact night coverage as above 10/17/2020, 2:31 PM  LOS: 4 days

## 2020-10-17 NOTE — Plan of Care (Signed)
  Problem: Education: Goal: Knowledge of General Education information will improve Description Including pain rating scale, medication(s)/side effects and non-pharmacologic comfort measures Outcome: Progressing   

## 2020-10-18 ENCOUNTER — Inpatient Hospital Stay (HOSPITAL_COMMUNITY): Payer: 59 | Admitting: Anesthesiology

## 2020-10-18 ENCOUNTER — Encounter (HOSPITAL_COMMUNITY): Payer: Self-pay | Admitting: Internal Medicine

## 2020-10-18 ENCOUNTER — Encounter (HOSPITAL_COMMUNITY)
Admission: EM | Disposition: A | Discharge status: Home / Self Care | Payer: Self-pay | Source: Home / Self Care | Attending: Internal Medicine

## 2020-10-18 DIAGNOSIS — R0602 Shortness of breath: Secondary | ICD-10-CM | POA: Diagnosis not present

## 2020-10-18 DIAGNOSIS — R590 Localized enlarged lymph nodes: Secondary | ICD-10-CM | POA: Diagnosis not present

## 2020-10-18 HISTORY — PX: VIDEO BRONCHOSCOPY WITH ENDOBRONCHIAL ULTRASOUND: SHX6177

## 2020-10-18 HISTORY — PX: BRONCHIAL WASHINGS: SHX5105

## 2020-10-18 HISTORY — PX: BRONCHIAL NEEDLE ASPIRATION BIOPSY: SHX5106

## 2020-10-18 LAB — BASIC METABOLIC PANEL
Anion gap: 11 (ref 5–15)
BUN: 28 mg/dL — ABNORMAL HIGH (ref 6–20)
CO2: 27 mmol/L (ref 22–32)
Calcium: 9.5 mg/dL (ref 8.9–10.3)
Chloride: 97 mmol/L — ABNORMAL LOW (ref 98–111)
Creatinine, Ser: 1.62 mg/dL — ABNORMAL HIGH (ref 0.61–1.24)
GFR, Estimated: 50 mL/min — ABNORMAL LOW (ref >60)
Glucose, Bld: 110 mg/dL — ABNORMAL HIGH (ref 70–99)
Potassium: 4.5 mmol/L (ref 3.5–5.1)
Sodium: 135 mmol/L (ref 135–145)

## 2020-10-18 SURGERY — BRONCHOSCOPY, WITH EBUS
Anesthesia: General | Laterality: Bilateral

## 2020-10-18 MED ORDER — PHENYLEPHRINE 40 MCG/ML (10ML) SYRINGE FOR IV PUSH (FOR BLOOD PRESSURE SUPPORT)
PREFILLED_SYRINGE | INTRAVENOUS | Status: DC | PRN
  Administered 2020-10-18: 160 ug via INTRAVENOUS

## 2020-10-18 MED ORDER — MIDAZOLAM HCL 2 MG/2ML IJ SOLN
INTRAMUSCULAR | Status: DC | PRN
Start: 2020-10-18 — End: 2020-10-18
  Administered 2020-10-18: 1 mg via INTRAVENOUS

## 2020-10-18 MED ORDER — PHENYLEPHRINE HCL-NACL 20-0.9 MG/250ML-% IV SOLN
INTRAVENOUS | Status: DC | PRN
  Administered 2020-10-18: 30 ug/min via INTRAVENOUS

## 2020-10-18 MED ORDER — ACETAMINOPHEN 500 MG PO TABS
1000.0000 mg | ORAL_TABLET | Freq: Once | ORAL | Status: AC
  Administered 2020-10-18: 1000 mg via ORAL

## 2020-10-18 MED ORDER — VASOPRESSIN 20 UNIT/ML IV SOLN
INTRAVENOUS | Status: DC | PRN
  Administered 2020-10-18: 2 [IU] via INTRAVENOUS

## 2020-10-18 MED ORDER — CARVEDILOL 3.125 MG PO TABS
3.1250 mg | ORAL_TABLET | Freq: Two times a day (BID) | ORAL | Status: DC
  Administered 2020-10-19: 3.125 mg via ORAL
  Filled 2020-10-18: qty 1

## 2020-10-18 MED ORDER — LIDOCAINE 2% (20 MG/ML) 5 ML SYRINGE
INTRAMUSCULAR | Status: DC | PRN
  Administered 2020-10-18: 80 mg via INTRAVENOUS

## 2020-10-18 MED ORDER — EPHEDRINE SULFATE 50 MG/ML IJ SOLN
INTRAMUSCULAR | Status: DC | PRN
  Administered 2020-10-18: 5 mg via INTRAVENOUS

## 2020-10-18 MED ORDER — PROPOFOL 10 MG/ML IV BOLUS
INTRAVENOUS | Status: DC | PRN
  Administered 2020-10-18: 100 mg via INTRAVENOUS

## 2020-10-18 MED ORDER — FENTANYL CITRATE (PF) 100 MCG/2ML IJ SOLN: 25.0000 ug | INTRAMUSCULAR | Status: DC | PRN

## 2020-10-18 MED ORDER — ONDANSETRON HCL 4 MG/2ML IJ SOLN: 4.0000 mg | Freq: Once | INTRAMUSCULAR | Status: DC | PRN

## 2020-10-18 MED ORDER — SUGAMMADEX SODIUM 200 MG/2ML IV SOLN
INTRAVENOUS | Status: DC | PRN
  Administered 2020-10-18: 200 mg via INTRAVENOUS

## 2020-10-18 MED ORDER — LACTATED RINGERS IV SOLN: INTRAVENOUS | Status: DC

## 2020-10-18 MED ORDER — ACETAMINOPHEN 500 MG PO TABS
ORAL_TABLET | ORAL | Status: AC
  Filled 2020-10-18: qty 2

## 2020-10-18 MED ORDER — ROCURONIUM BROMIDE 10 MG/ML (PF) SYRINGE
PREFILLED_SYRINGE | INTRAVENOUS | Status: DC | PRN
  Administered 2020-10-18: 50 mg via INTRAVENOUS

## 2020-10-18 MED ORDER — CHLORHEXIDINE GLUCONATE 0.12 % MT SOLN
OROMUCOSAL | Status: AC
  Filled 2020-10-18: qty 15

## 2020-10-18 MED ORDER — PHENYLEPHRINE 40 MCG/ML (10ML) SYRINGE FOR IV PUSH (FOR BLOOD PRESSURE SUPPORT)
PREFILLED_SYRINGE | INTRAVENOUS | Status: DC | PRN
  Administered 2020-10-18 (×2): 160 ug via INTRAVENOUS

## 2020-10-18 SURGICAL SUPPLY — 36 items
ADAPTER VALVE BIOPSY EBUS (MISCELLANEOUS) IMPLANT
ADPTR VALVE BIOPSY EBUS (MISCELLANEOUS)
BRUSH CYTOL CELLEBRITY 1.5X140 (MISCELLANEOUS) IMPLANT
CANISTER SUCT 3000ML PPV (MISCELLANEOUS) ×4 IMPLANT
CONT SPEC 4OZ CLIKSEAL STRL BL (MISCELLANEOUS) ×4 IMPLANT
COVER BACK TABLE 60X90IN (DRAPES) ×4 IMPLANT
FORCEPS BIOP RJ4 1.8 (CUTTING FORCEPS) IMPLANT
GAUZE SPONGE 4X4 12PLY STRL (GAUZE/BANDAGES/DRESSINGS) ×4 IMPLANT
GLOVE BIO SURGEON STRL SZ7.5 (GLOVE) ×4 IMPLANT
GOWN STRL REUS W/ TWL LRG LVL3 (GOWN DISPOSABLE) ×2 IMPLANT
GOWN STRL REUS W/ TWL XL LVL3 (GOWN DISPOSABLE) ×2 IMPLANT
GOWN STRL REUS W/TWL LRG LVL3 (GOWN DISPOSABLE) ×4
GOWN STRL REUS W/TWL XL LVL3 (GOWN DISPOSABLE) ×4
KIT CLEAN ENDO COMPLIANCE (KITS) ×8 IMPLANT
KIT TURNOVER KIT B (KITS) ×4 IMPLANT
MARKER SKIN DUAL TIP RULER LAB (MISCELLANEOUS) ×4 IMPLANT
NDL ASPIRATION VIZISHOT 19G (NEEDLE) IMPLANT
NDL ASPIRATION VIZISHOT 21G (NEEDLE) IMPLANT
NEEDLE ASPIRATION VIZISHOT 19G (NEEDLE) IMPLANT
NEEDLE ASPIRATION VIZISHOT 21G (NEEDLE) IMPLANT
NS IRRIG 1000ML POUR BTL (IV SOLUTION) ×4 IMPLANT
OIL SILICONE PENTAX (PARTS (SERVICE/REPAIRS)) ×4 IMPLANT
PAD ARMBOARD 7.5X6 YLW CONV (MISCELLANEOUS) ×8 IMPLANT
SYR 20ML ECCENTRIC (SYRINGE) ×8 IMPLANT
SYR 20ML LL LF (SYRINGE) ×8 IMPLANT
SYR 50ML SLIP (SYRINGE) IMPLANT
SYR 5ML LUER SLIP (SYRINGE) ×4 IMPLANT
TOWEL GREEN STERILE FF (TOWEL DISPOSABLE) ×4 IMPLANT
TRAP SPECIMEN MUCOUS 40CC (MISCELLANEOUS) IMPLANT
TUBE CONNECTING 20'X1/4 (TUBING) ×2
TUBE CONNECTING 20X1/4 (TUBING) ×6 IMPLANT
UNDERPAD 30X30 (UNDERPADS AND DIAPERS) ×4 IMPLANT
VALVE BIOPSY  SINGLE USE (MISCELLANEOUS) ×4
VALVE BIOPSY SINGLE USE (MISCELLANEOUS) ×2 IMPLANT
VALVE SUCTION BRONCHIO DISP (MISCELLANEOUS) ×4 IMPLANT
WATER STERILE IRR 1000ML POUR (IV SOLUTION) ×4 IMPLANT

## 2020-10-18 NOTE — Progress Notes (Signed)
Progress Note  Patient Name: Nathan [name] Date of Encounter: 10/18/2020  Primary Cardiologist:   Chilton Si, MD   Subjective   No pain.  Breathing OK.   Inpatient Medications    Scheduled Meds:  acetaminophen       aspirin EC  81 mg Oral Daily   chlorhexidine       docusate sodium  100 mg Oral BID   enoxaparin (LOVENOX) injection  40 mg Subcutaneous Q24H   feeding supplement  237 mL Oral BID BM   furosemide  40 mg Oral BID   loratadine  10 mg Oral Daily   montelukast  10 mg Oral Daily   multivitamin with minerals  1 tablet Oral Daily   pantoprazole  40 mg Oral Daily   potassium chloride  20 mEq Oral BID   predniSONE  40 mg Oral Q breakfast   sodium chloride flush  3 mL Intravenous Q12H   sodium chloride flush  3 mL Intravenous Q12H   sodium chloride flush  3 mL Intravenous Q12H   tamsulosin  0.8 mg Oral QODAY   Continuous Infusions:  sodium chloride     sodium chloride     PRN Meds: sodium chloride, sodium chloride, acetaminophen **OR** acetaminophen, albuterol, methocarbamol, morphine injection, nitroGLYCERIN, ondansetron **OR** ondansetron (ZOFRAN) IV, oxyCODONE-acetaminophen, polyethylene glycol, sodium chloride flush, sodium chloride flush   Vital Signs    Vitals:   10/18/20 1245 10/18/20 1308 10/18/20 1455 10/18/20 1735  BP: (!) 84/65 91/70 (!) 94/59   Pulse: 99 88    Resp: 12 12 20    Temp: 97.8 F (36.6 C)  98.7 F (37.1 C)   TempSrc:   Oral   SpO2: 100%  (!) 50% 97%  Weight:      Height:        Intake/Output Summary (Last 24 hours) at 10/18/2020 1759 Last data filed at 10/18/2020 1300 Gross per 24 hour  Intake 636.67 ml  Output 1425 ml  Net -788.33 ml   Filed Weights   10/16/20 0402 10/17/20 0334 10/18/20 0657  Weight: 101.1 kg 101 kg 101 kg    Telemetry    Sinus tachycardia, - Personally Reviewed  ECG    NA - Personally Reviewed  Physical Exam   GENERAL:  Well appearing NECK:  No jugular venous distention, waveform  within normal limits, carotid upstroke brisk and symmetric, no bruits, no thyromegaly LUNGS:  Clear to auscultation bilaterally CHEST:  Unremarkable HEART:  PMI not displaced or sustained,S1 and S2 within normal limits, positive S3, no S4, no clicks, no rubs, no murmurs ABD:  Flat, positive bowel sounds normal in frequency in pitch, no bruits, no rebound, no guarding, no midline pulsatile mass, no hepatomegaly, no splenomegaly EXT:  2 plus pulses throughout, no edema, no cyanosis no clubbing   Labs    Chemistry Recent Labs  Lab 10/12/20 1252 10/13/20 0237 10/14/20 0327 10/16/20 0343 10/16/20 1655 10/16/20 1704 10/17/20 0231 10/18/20 0100  NA  --  136   < > 134*   < > 139 137 135  K  --  3.7   < > 4.8   < > 4.3 4.2 4.5  CL  --  107   < > 103  --   --  106 97*  CO2  --  21*   < > 23  --   --  23 27  GLUCOSE  --  103*   < > 114*  --   --  108* 110*  BUN  --  13   < > 20  --   --  22* 28*  CREATININE  --  1.64*   < > 1.44*  --   --  1.58* 1.62*  CALCIUM  --  8.6*   < > 9.2  --   --  9.1 9.5  PROT 7.3 6.2*  --   --   --   --   --   --   ALBUMIN 3.3* 2.8*  --   --   --   --   --   --   AST 30 25  --   --   --   --   --   --   ALT 19 16  --   --   --   --   --   --   ALKPHOS 73 63  --   --   --   --   --   --   BILITOT 1.1 1.1  --   --   --   --   --   --   GFRNONAA  --  50*   < > 58*  --   --  52* 50*  ANIONGAP  --  8   < > 8  --   --  8 11   < > = values in this interval not displayed.     Hematology Recent Labs  Lab 10/14/20 0327 10/16/20 0343 10/16/20 1655 10/16/20 1703 10/16/20 1704 10/17/20 0231  WBC 5.9 5.7  --   --   --  7.4  RBC 4.07* 4.14*  --   --   --  4.24  HGB 11.1* 11.4*   < > 12.2* 12.2* 11.6*  HCT 34.9* 34.9*   < > 36.0* 36.0* 36.3*  MCV 85.7 84.3  --   --   --  85.6  MCH 27.3 27.5  --   --   --  27.4  MCHC 31.8 32.7  --   --   --  32.0  RDW 15.2 14.9  --   --   --  15.2  PLT 216 243  --   --   --  360   < > = values in this interval not displayed.     Cardiac EnzymesNo results for input(s): TROPONINI in the last 168 hours. No results for input(s): TROPIPOC in the last 168 hours.   BNP Recent Labs  Lab 10/12/20 1253  BNP 558.3*     DDimer  Recent Labs  Lab 10/12/20 1252  DDIMER 1.68*     Radiology    MR CARDIAC MORPHOLOGY W WO CONTRAST  Result Date: 10/17/2020 CLINICAL DATA:  Clinical question of sarcoidosis EXAM: CARDIAC MRI TECHNIQUE: The patient was scanned on a 1.5 Tesla GE magnet. A dedicated cardiac coil was used. Functional imaging was done using Fiesta sequences. 2,3, and 4 chamber views were done to assess for RWMA's. Modified Simpson's rule using a short axis stack was used to calculate an ejection fraction on a dedicated work [employer]. The patient received 10 cc of Gadavist. After 10 minutes inversion recovery sequences were used to assess for infiltration and scar tissue. CONTRAST:  10 cc  of Gadavist FINDINGS: 1. Severe left ventricular dilation, with LVEDD 70 mm, but LVEDVi 142 mL/m2. Normal left ventricular thickness, with intraventricular septal thickness of 7 mm, posterior wall thickness of 7 mm, and septal to posterior ratio < 1.5. Severe left ventricular systolic dysfunction (LVEF =  18%). There is severe basal and mid anterior, anteroseptal, inferior, and lateral walls hypokinesis. Left ventricular parametric mapping notable for slight elevation in the native T1 signal and normal T2 signal. There is late gadolinium enhancement in the left ventricular myocardium: Patchy basal septal LGE, transmural basal inferolateral and anterolateral LGE with associated thinning that extends into the apex. There is inferior epicardial LGE in the base. Though this could be consistent with multi-vessel disease, lack of subendocardial involvement and patchy nature would favor sarcoidosis. 2. Normal right ventricular size with RVEDVI 58 mL/m2. Normal right ventricular thickness. Moderate right ventricular systolic  dysfunction (RVEF =33%). There are no regional wall motion abnormalities or aneurysms. 3.  Normal left and right atrial size. 4. Normal size of the aortic root, ascending aorta and pulmonary artery. 5.  Qualitatively there is at least moderate mitral regurgitation. 6.  Normal pericardium.  No pericardial effusion. 7. There are bilateral effusions and regions within the right pleural effusion that are isointense with myocardium. This is best seen posterior to the left atrium. Correlates to area on recent CTPE with Hounsfield units of 258. This may be consistent with patient's bulky lymphadenopathy with some associated atelectasis. Ultimately, would recommended dedicated study if additional imaging need for non-cardiac pathology. 8.  Breath hold artifact noted. 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 Non-cardiac findings discussed with primary and pulmonology teams. Riley Lam MD Electronically Signed   By: Riley Lam M.D.   On: 10/17/2020 13:39    Cardiac Studies   MRI:    1. Severe left ventricular dilation, with LVEDD 70 mm, but LVEDVi 142 mL/m2.   Normal left ventricular thickness, with intraventricular septal thickness of 7 mm, posterior wall thickness of 7 mm, and septal to posterior ratio < 1.5.   Severe left ventricular systolic dysfunction (LVEF = 18%). There is severe basal and mid anterior, anteroseptal, inferior, and lateral walls hypokinesis.   Left ventricular parametric mapping notable for slight elevation in the native T1 signal and normal T2 signal.   There is late gadolinium enhancement in the left ventricular myocardium: Patchy basal septal LGE, transmural basal inferolateral and anterolateral LGE with associated thinning that extends into the apex. There is inferior epicardial LGE in the base. Though this could be consistent with multi-vessel disease, lack of subendocardial involvement and patchy nature  would favor sarcoidosis.   2. Normal right ventricular size with RVEDVI 58 mL/m2.   Normal right ventricular thickness.   Moderate right ventricular systolic dysfunction (RVEF =33%). There are no regional wall motion abnormalities or aneurysms.   3.  Normal left and right atrial size.   4. Normal size of the aortic root, ascending aorta and pulmonary artery.   5.  Qualitatively there is at least moderate mitral regurgitation.   6.  Normal pericardium.  No pericardial effusion.   7. There are bilateral effusions and regions within the right pleural effusion that are isointense with myocardium. This is best seen posterior to the left atrium. Correlates to area on recent CTPE with Hounsfield units of 258. This may be consistent with patient's bulky lymphadenopathy with some associated atelectasis. Ultimately, would recommended dedicated study if additional imaging need for non-cardiac pathology.   8.  Breath hold artifact noted.   Echo:   Left Ventricle: Left ventricular ejection fraction, by estimation, is 30  to 35%. The left ventricle has moderately decreased function. The left  ventricle demonstrates global hypokinesis. The left ventricular internal  cavity size was severely dilated.  There is no left ventricular hypertrophy. Left ventricular diastolic  parameters are indeterminate.   Right Ventricle: The right ventricular size is normal. Right ventricular  systolic function is normal. There is moderately elevated pulmonary artery  systolic pressure. The tricuspid regurgitant velocity is 3.48 m/s, and  with an assumed right atrial  pressure of 3 mmHg, the estimated right ventricular systolic pressure is  51.4 mmHg.   Cardiac cath (right heart pressures) Flowsheet Row Most Recent Value  Fick Cardiac Output 6.75 L/min  Fick Cardiac Output Index 3.06 (L/min)/BSA  RA A Wave 9 mmHg  RA V Wave 5 mmHg  RA Mean 5 mmHg  RV Systolic Pressure 43 mmHg  RV Diastolic Pressure  4 mmHg  RV EDP 7 mmHg  PA Systolic Pressure 46 mmHg  PA Diastolic Pressure 19 mmHg  PA Mean 33 mmHg  PW A Wave 28 mmHg  PW V Wave 27 mmHg  PW Mean 27 mmHg  AO Systolic Pressure 89 mmHg  AO Diastolic Pressure 70 mmHg  AO Mean 79 mmHg  LV Systolic Pressure 93 mmHg  LV Diastolic Pressure 20 mmHg  LV EDP 31 mmHg  AOp Systolic Pressure 89 mmHg  AOp Diastolic Pressure 70 mmHg  AOp Mean Pressure 79 mmHg  LVp Systolic Pressure 92 mmHg  LVp Diastolic Pressure 17 mmHg  LVp EDP Pressure 29 mmHg  QP/QS 1  TPVR Index 10.77 HRUI  TSVR Index 25.78 HRUI  PVR SVR Ratio 0.08  TPVR/TSVR Ratio 0.42    Patient Profile     55 y.o. 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  Worsening shortness of breath over the past 4 weeks.  Assessment & Plan   Acute systolic and diastolic HF:   Normal coronaries.  Right heart pressures as above.  Probable cardiac sarcoid as above on MRI.  Discussed with Dr. Gala Romney.   No indication for PET or other testing as this time pending the biopsy results to confirm or exclude sarcoid.  He will need to be followed in the advanced HF clinic.  I am going to hold his Lasix.  I would like to see if he will tolerate a low dose of beta blocker.     Adenopathy:   No endobronchial lesions.  Lymph node biopsies completed.    CKD IIIA:  Net negative 6 liters. I switched to PO Lasix.  Hold as above.   MR:     Secondary.   Elevated troponin:  NL coronaries.  No further work up.     For questions or updates, please contact [facility] Please consult [email or web address] for contact info under Cardiology/STEMI.   Signed, Rollene Rotunda, MD  10/18/2020, 5:59 PM

## 2020-10-18 NOTE — Anesthesia Procedure Notes (Signed)
Procedure Name: Intubation Date/Time: 10/18/2020 11:25 AM Performed by: Geraldine Contras, CRNA Pre-anesthesia Checklist: Patient identified, Patient being monitored, Timeout performed, Emergency Drugs available and Suction available Patient Re-evaluated:Patient Re-evaluated prior to induction Oxygen Delivery Method: Circle system utilized Preoxygenation: Pre-oxygenation with 100% oxygen Induction Type: IV induction Ventilation: Mask ventilation without difficulty Laryngoscope Size: Mac and 4 Grade View: Grade I Tube type: Oral Tube size: 8.5 mm Number of attempts: 1 Airway Equipment and Method: Stylet Placement Confirmation: ETT inserted through vocal cords under direct vision, positive ETCO2 and breath sounds checked- equal and bilateral Secured at: 21 cm Tube secured with: Tape Dental Injury: Teeth and Oropharynx as per pre-operative assessment

## 2020-10-18 NOTE — Progress Notes (Signed)
PROGRESS NOTE  CAMEO SHEWELL PPJ:[id number] DOB: Sep 26, 1965 DOA: 10/12/2020 PCP: Dois Davenport, MD  Brief History   Nathan [name] is a 55 y.o. male with no significant past medical history presented to hospital with 3 to 4 weeks of shortness of breath and chest discomfort for 3 to 4 weeks.    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  Chest x-ray was done which showed some hilar lymphadenopathy.  Was initially noted to be hypoxic with pulse ox of 87% on room air.  Patient was also noted to be tachycardic.  Patient was then transferred to the ED for further evaluation.  In the ED, patient was noted to have elevated BNP of 558.  D- dimer was elevated at 1.6, so CT angiogram of the chest was performed which showed a hilar and midsternal lymphadenopathy, pleural effusion.  COVID test and influenza was negative.  Creatinine was elevated at 1.5.  Chest x-ray showed patchy interstitial and hazy airspace opacities suggestive of multifocal pneumonia.  Patient also received Rocephin and Zithromax and 20 mg of Lasix in the ED.  Patient was then admitted hospital for further evaluation and treatment.   The patient underwent right and left heart catheterization on 10/16/2020 to evaluate MR. Plan now is for medical management.   Cardiac MRI performed this am to evaluate for cardiac sarcoidosis. The study was consistent with cardiac sarcoidosis.  The patient underwent bronchoscopy/EBUS earlier today. No endobronchial lesions were seen. Transbronchial biopsies were taken of lymph nodes. No blockage of bronchi were seen on exam per Dr. Tonia Brooms. Consultants  PCCM Cardiology  Procedures  Right and left heart catheterization Bronchoscopy and EBUS with biopsies of lymphadenopathy  Antibiotics   Anti-infectives (From admission, onward)    Start     Dose/Rate Route Frequency Ordered Stop   10/14/20 1700  azithromycin  (ZITHROMAX) tablet 500 mg        500 mg Oral Daily 10/14/20 1534 10/17/20 0950   10/13/20 1600  cefTRIAXone (ROCEPHIN) 2 g in sodium chloride 0.9 % 100 mL IVPB        2 g 200 mL/hr over 30 Minutes Intravenous Every 24 hours 10/12/20 1737 10/17/20 2359   10/12/20 1745  cefTRIAXone (ROCEPHIN) 1 g in sodium chloride 0.9 % 100 mL IVPB  Status:  Discontinued        1 g 200 mL/hr over 30 Minutes Intravenous  Once 10/12/20 1737 10/14/20 1532   10/12/20 1730  cefTRIAXone (ROCEPHIN) 1 g in sodium chloride 0.9 % 100 mL IVPB  Status:  Discontinued        1 g 200 mL/hr over 30 Minutes Intravenous Every 24 hours 10/12/20 1727 10/12/20 1735   10/12/20 1730  azithromycin (ZITHROMAX) 500 mg in sodium chloride 0.9 % 250 mL IVPB  Status:  Discontinued        500 mg 250 mL/hr over 60 Minutes Intravenous Every 24 hours 10/12/20 1727 10/14/20 1534   10/12/20 1615  cefTRIAXone (ROCEPHIN) 1 g in sodium chloride 0.9 % 100 mL IVPB        1 g 200 mL/hr over 30 Minutes Intravenous  Once 10/12/20 1608 10/12/20 1730   10/12/20 1615  azithromycin (ZITHROMAX) 500 mg in sodium chloride 0.9 % 250 mL IVPB  Status:  Discontinued        500 mg 250 mL/hr over 60 Minutes Intravenous  Once 10/12/20 1608 10/12/20 1736  Subjective  Nathan [name] is awake and alert. No new complaints.   Objective   Vitals:  Vitals:   10/18/20 1455 10/18/20 1735  BP: (!) 94/59   Pulse:    Resp: 20   Temp: 98.7 F (37.1 C)   SpO2: (!) 50% 97%    Exam:  Constitutional:  Patient is awake, alert, and oriented x 3. No acute distress. Respiratory:  CTA bilaterally, no w/r/r.  Respiratory effort normal. No retractions or accessory muscle use Cardiovascular:  RRR, no m/r/g No LE extremity edema   Normal pedal pulses Abdomen:  Abdomen appears normal; no tenderness or masses No hernias No HSM Musculoskeletal:  Digits/nails BUE: no clubbing, cyanosis, petechiae, infection exam of joints, bones, muscles of at least one of following:  head/neck, RUE, LUE, RLE, LLE   No tenderness, masses Skin:  No rashes, lesions, ulcers palpation of skin: no induration or nodules Neurologic:  CN 2-12 intact Sensation all 4 extremities intact Psychiatric:  Mental status awake and alert. Mood, affect congruent Orientation to person, place, time   I have personally reviewed the following:   Today's Data  CBC, BMP, Creatinine 1.58  Imaging  CTA chest with contrast. Cardiac MRI  Cardiology Data  EKG Echocardiogram  Scheduled Meds:  acetaminophen       aspirin EC  81 mg Oral Daily   chlorhexidine       docusate sodium  100 mg Oral BID   enoxaparin (LOVENOX) injection  40 mg Subcutaneous Q24H   feeding supplement  237 mL Oral BID BM   furosemide  40 mg Oral BID   loratadine  10 mg Oral Daily   montelukast  10 mg Oral Daily   multivitamin with minerals  1 tablet Oral Daily   pantoprazole  40 mg Oral Daily   potassium chloride  20 mEq Oral BID   predniSONE  40 mg Oral Q breakfast   sodium chloride flush  3 mL Intravenous Q12H   sodium chloride flush  3 mL Intravenous Q12H   sodium chloride flush  3 mL Intravenous Q12H   tamsulosin  0.8 mg Oral QODAY   Continuous Infusions:  sodium chloride     sodium chloride      Principal Problem:   Shortness of breath Active Problems:   Pneumonia   Chest pain   Acute combined systolic and diastolic heart failure (HCC)   Sarcoidosis   Severe mitral regurgitation   Pleural effusion   Thoracic lymphadenopathy   Dilated cardiomyopathy (HCC)   LOS: 6 days   A & P  Thoracic lymphadenopathy Bilateral mediastinal and hilar lymphadenopathy. Bronchoscopy and EBUS with biopsies seen earlier today. He may have a community-acquired pneumonia but the size of the lymph nodes is out of proportion. ACE level of 41 was not helpful Continue ceftriaxone, azithromycin x 5 ds, although I doubt infection. Last dose today. Patient is currently saturating 96% on room air. Seems like he  will need biopsy of mediastinal lymph nodes either EBUS guided TB NA or mediastinoscopy (more invasive).  He seems to be acutely ill with life-threatening cardiac involvement hence we will start treating for sarcoidosis empirically with oral prednisone 40 mg.  The patient had been cleared by cardiology for bronchoscopy and EBUS. It was performed this morning. No endobronchial lesions were seen. Transbronchial biopsies were taken of lymph nodes. No blockage of bronchi were seen on exam per Dr. Tonia Brooms. Shortness of breath Improving. The patient is currently saturating 96% on room air. Monitor and wean as  possible. Severe mitral regurgitation Nathan [name] to underwent left and right cardiac catheterization on 10/16/2020 to evaluate for consideration of possible mitral valve repair. Cardiology is opting for medical management only at this point. Sarcoidosis Patient with known ocular sarcoidosis. Patient was forced to discontinue therapy when he lost insurance. . The patient has undergone a cardiac MRI on 10/17/2020 evaluate for cardiac sarcoidosis. 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 Pneumonia Upon presentation the patient demonstrated bilateral patchy infiltrates most prominent in the right upper lobe. The patient will complete treatment for community acquired pneumonia today. However per pulmonary the lymphadenopathy is out of scale for pneumonia. Testing for influenza and COVID-19 are negative. Pleural effusion Treated effectively with diuresis. Monitor. Chest pain Resolved. Chest x-ray with multifocal pneumonia.  Continue with Rocephin and Zithromax through 10/17/2020.  Ruled out for MI by EKG and enzyme criteria. CT scan shows bilateral hilar and mediastinal lymphadenopathy with pleural effusion.  Possibility of lymphoproliferative disorder/sarcoidosis.  HIV nonreactive.  Pulmonary on board and planing for bronchoscopy with EBUS on 10/18/2020.  Continue with nebulizer,  supportive care, incentive spirometry.  Cardiac MRI has been completed on 10/17/2020. Acute combined systolic and diastolic heart failure (HCC) Echocardiogram performed on 10/13/2020 demonstrated EF of 30-35% with global hypokinesis with a severely dilated LV cavity. There were indeterminate diastolic parameters.   Inpatient status I have seen and examined this patient myself. I have spent 32 minutes in his evaluation and care.  DVT prophylaxis: Lovenox Code Status: Full Family Communication: None available Disposition Plan: tbd    Deriyah Kunath, DO [facility] Direct contact: see [email or web address]  7PM-7AM contact night coverage as above 10/18/2020, 6:04 PM  LOS: 4 days

## 2020-10-18 NOTE — Anesthesia Postprocedure Evaluation (Signed)
Anesthesia Post Note  Patient: Nathan [name]  Procedure(s) Performed: VIDEO BRONCHOSCOPY WITH ENDOBRONCHIAL ULTRASOUND (Bilateral) BRONCHIAL NEEDLE ASPIRATION BIOPSIES BRONCHIAL WASHINGS     Patient location during evaluation: PACU Anesthesia Type: General Level of consciousness: awake and alert Pain management: pain level controlled Vital Signs Assessment: post-procedure vital signs reviewed and stable Respiratory status: spontaneous breathing, nonlabored ventilation and respiratory function stable Cardiovascular status: blood pressure returned to baseline and stable Postop Assessment: no apparent nausea or vomiting Anesthetic complications: no   No notable events documented.  Last Vitals:  Vitals:   10/18/20 1735 10/18/20 1944  BP:  (!) 103/58  Pulse:    Resp:  (!) 25  Temp:  36.8 C  SpO2: 97% 96%    Last Pain:  Vitals:   10/18/20 1944  TempSrc: Oral  PainSc: 0-No pain                 Cecile Hearing

## 2020-10-18 NOTE — Interval H&P Note (Signed)
History and Physical Interval Note:  10/18/2020 11:13 AM  Nathan [name]  has presented today for surgery, with the diagnosis of Adenopathy.  The various methods of treatment have been discussed with the patient and family. After consideration of risks, benefits and other options for treatment, the patient has consented to  Procedure(s): VIDEO BRONCHOSCOPY WITH ENDOBRONCHIAL ULTRASOUND (Bilateral) as a surgical intervention.  The patient's history has been reviewed, patient examined, no change in status, stable for surgery.  I have reviewed the patient's chart and labs.  Questions were answered to the patient's satisfaction.     Rachel Bo Kamarion Zagami

## 2020-10-18 NOTE — Transfer of Care (Signed)
Immediate Anesthesia Transfer of Care Note  Patient: Nathan [name]  Procedure(s) Performed: VIDEO BRONCHOSCOPY WITH ENDOBRONCHIAL ULTRASOUND (Bilateral) BRONCHIAL NEEDLE ASPIRATION BIOPSIES  Patient Location: PACU  Anesthesia Type:General  Level of Consciousness: drowsy and patient cooperative  Airway & Oxygen Therapy: Patient Spontanous Breathing and Patient connected to nasal cannula oxygen  Post-op Assessment: Report given to RN, Post -op Vital signs reviewed and stable and Patient moving all extremities  Post vital signs: Reviewed and stable  Last Vitals:  Vitals Value Taken Time  BP 100/65 10/18/20 1220  Temp    Pulse    Resp 16 10/18/20 1222  SpO2    Vitals shown include unvalidated device data.  Last Pain:  Vitals:   10/18/20 0928  TempSrc: Oral  PainSc: 0-No pain      Patients Stated Pain Goal: 0 (10/17/20 0929)  Complications: No notable events documented.

## 2020-10-18 NOTE — Op Note (Signed)
Video Bronchoscopy with Endobronchial Ultrasound Procedure Note  Date of Operation: 10/18/2020  Pre-op Diagnosis: Mediastinal adenopathy   Post-op Diagnosis: Mediastinal adenopathy  Surgeon: Garner Nash, DO   Assistants: None   Anesthesia: General endotracheal anesthesia  Operation: Flexible video fiberoptic bronchoscopy with endobronchial ultrasound and biopsies.  Estimated Blood Loss: Minimal  Complications: None  Indications and History: Nathan [name] is a 55 y.o. male with mediastinal adenopathy.  The risks, benefits, complications, treatment options and expected outcomes were discussed with the patient.  The possibilities of pneumothorax, pneumonia, reaction to medication, pulmonary aspiration, perforation of a viscus, bleeding, failure to diagnose a condition and creating a complication requiring transfusion or operation were discussed with the patient who freely signed the consent.    Description of Procedure: The patient was examined in the preoperative area and history and data from the preprocedure consultation were reviewed. It was deemed appropriate to proceed.  The patient was taken to [facility] endoscopy room 3, identified as Nathan [name] and the procedure verified as Flexible Video Fiberoptic Bronchoscopy.  A Time Out was held and the above information confirmed. After being taken to the operating room general anesthesia was initiated and the patient  was orally intubated. The video fiberoptic bronchoscope was introduced via the endotracheal tube and a general inspection was performed which showed normal right and left lung anatomy, no evidence of endobronchial lesion. The standard scope was then withdrawn and the endobronchial ultrasound was used to identify and characterize the peritracheal, hilar and bronchial lymph nodes. Inspection showed enlarged paratracheal and subcarinal adenopathy. Using real-time ultrasound guidance Wang needle biopsies were take from Station 7  nodes and were sent for cytology.  We sent tissue samples for culture as well.  Following endobronchial ultrasound component of the procedure we switched back to the therapeutic standard bronchoscope.  A BAL was performed in the right middle lobe for cultures, cell count and differential.  Standard therapeutic scope was used for aspiration of the bilateral mainstem's to remove any remaining blood clots and debris.  All distal subsegments were patent at the termination of the procedure. The patient tolerated the procedure well without apparent complications. There was no significant blood loss. The bronchoscope was withdrawn. Anesthesia was reversed and the patient was taken to the PACU for recovery.   Samples: 1. Wang needle biopsies from Station 7 node 2. RML BAL   Plans:  The patient will be discharged from the PACU to home when recovered from anesthesia. We will review the cytology, pathology and microbiology results with the patient when they become available. Outpatient followup will be with Dr. Loanne Drilling pending pathology results.   Garner Nash, DO [facility] 10/18/2020 12:21 PM

## 2020-10-18 NOTE — Anesthesia Preprocedure Evaluation (Addendum)
Anesthesia Evaluation  Patient identified by MRN, date of birth, ID band Patient awake    Reviewed: Allergy & Precautions, NPO status , Patient's Chart, lab work & pertinent test results  Airway Mallampati: II  TM Distance: >3 FB Neck ROM: Full    Dental  (+) Dental Advisory Given, Poor Dentition, Missing   Pulmonary shortness of breath, former smoker,  Adenopathy    Pulmonary exam normal breath sounds clear to auscultation       Cardiovascular (-) angina+CHF  + Valvular Problems/Murmurs MR  Rhythm:Regular Rate:Tachycardia  Echocardiogram performed on 10/13/2020 demonstrated EF of 30-35% with global hypokinesis with a severely dilated LV cavity.    Neuro/Psych negative neurological ROS  negative psych ROS   GI/Hepatic Neg liver ROS, GERD  Medicated,  Endo/Other  Obesity   Renal/GU Renal InsufficiencyRenal disease     Musculoskeletal negative musculoskeletal ROS (+)   Abdominal   Peds  Hematology  (+) Blood dyscrasia, anemia ,   Anesthesia Other Findings Day of surgery medications reviewed with the patient.  Reproductive/Obstetrics                            Anesthesia Physical Anesthesia Plan  ASA: 4  Anesthesia Plan: General   Post-op Pain Management:    Induction: Intravenous  PONV Risk Score and Plan: 2 and Midazolam, Dexamethasone and Ondansetron  Airway Management Planned: Oral ETT  Additional Equipment:   Intra-op Plan:   Post-operative Plan: Extubation in OR  Informed Consent: I have reviewed the patients History and Physical, chart, labs and discussed the procedure including the risks, benefits and alternatives for the proposed anesthesia with the patient or authorized representative who has indicated his/her understanding and acceptance.     Dental advisory given  Plan Discussed with: CRNA  Anesthesia Plan Comments: (Etomidate for induction)        Anesthesia Quick Evaluation

## 2020-10-19 DIAGNOSIS — J189 Pneumonia, unspecified organism: Secondary | ICD-10-CM

## 2020-10-19 DIAGNOSIS — D8685 Sarcoid myocarditis: Secondary | ICD-10-CM | POA: Diagnosis not present

## 2020-10-19 DIAGNOSIS — I493 Ventricular premature depolarization: Secondary | ICD-10-CM

## 2020-10-19 DIAGNOSIS — I5021 Acute systolic (congestive) heart failure: Secondary | ICD-10-CM

## 2020-10-19 DIAGNOSIS — I420 Dilated cardiomyopathy: Secondary | ICD-10-CM

## 2020-10-19 DIAGNOSIS — R0602 Shortness of breath: Secondary | ICD-10-CM | POA: Diagnosis not present

## 2020-10-19 DIAGNOSIS — E875 Hyperkalemia: Secondary | ICD-10-CM | POA: Diagnosis not present

## 2020-10-19 DIAGNOSIS — D869 Sarcoidosis, unspecified: Secondary | ICD-10-CM | POA: Diagnosis not present

## 2020-10-19 LAB — BASIC METABOLIC PANEL
Anion gap: 8 (ref 5–15)
Anion gap: 6 (ref 5–15)
BUN: 27 mg/dL — ABNORMAL HIGH (ref 6–20)
BUN: 25 mg/dL — ABNORMAL HIGH (ref 6–20)
CO2: 24 mmol/L (ref 22–32)
CO2: 25 mmol/L (ref 22–32)
Calcium: 9.2 mg/dL (ref 8.9–10.3)
Calcium: 9 mg/dL (ref 8.9–10.3)
Chloride: 102 mmol/L (ref 98–111)
Chloride: 103 mmol/L (ref 98–111)
Creatinine, Ser: 1.72 mg/dL — ABNORMAL HIGH (ref 0.61–1.24)
Creatinine, Ser: 1.52 mg/dL — ABNORMAL HIGH (ref 0.61–1.24)
GFR, Estimated: 47 mL/min — ABNORMAL LOW (ref >60)
GFR, Estimated: 54 mL/min — ABNORMAL LOW (ref >60)
Glucose, Bld: 123 mg/dL — ABNORMAL HIGH (ref 70–99)
Glucose, Bld: 141 mg/dL — ABNORMAL HIGH (ref 70–99)
Potassium: 5.7 mmol/L — ABNORMAL HIGH (ref 3.5–5.1)
Potassium: 4.5 mmol/L (ref 3.5–5.1)
Sodium: 134 mmol/L — ABNORMAL LOW (ref 135–145)
Sodium: 134 mmol/L — ABNORMAL LOW (ref 135–145)

## 2020-10-19 LAB — ACID FAST SMEAR (AFB, MYCOBACTERIA)
Acid Fast Smear: NEGATIVE
Acid Fast Smear: NEGATIVE

## 2020-10-19 MED ORDER — DIGOXIN 125 MCG PO TABS
0.1250 mg | ORAL_TABLET | Freq: Every day | ORAL | Status: DC
  Administered 2020-10-19 – 2020-10-22 (×4): 0.125 mg via ORAL
  Filled 2020-10-19 (×4): qty 1

## 2020-10-19 MED ORDER — DAPAGLIFLOZIN PROPANEDIOL 10 MG PO TABS
10.0000 mg | ORAL_TABLET | Freq: Every day | ORAL | Status: DC
  Administered 2020-10-19 – 2020-10-22 (×4): 10 mg via ORAL
  Filled 2020-10-19 (×4): qty 1

## 2020-10-19 MED ORDER — MEXILETINE HCL 200 MG PO CAPS
200.0000 mg | ORAL_CAPSULE | Freq: Two times a day (BID) | ORAL | Status: DC
Start: 2020-10-19 — End: 2020-10-23
  Administered 2020-10-19 – 2020-10-22 (×7): 200 mg via ORAL
  Filled 2020-10-19 (×8): qty 1

## 2020-10-19 MED ORDER — SULFAMETHOXAZOLE-TRIMETHOPRIM 800-160 MG PO TABS
1.0000 | ORAL_TABLET | Freq: Every day | ORAL | Status: DC
  Administered 2020-10-19 – 2020-10-21 (×3): 1 via ORAL
  Filled 2020-10-19 (×3): qty 1

## 2020-10-19 MED ORDER — SODIUM ZIRCONIUM CYCLOSILICATE 10 G PO PACK
10.0000 g | PACK | Freq: Once | ORAL | Status: AC
  Administered 2020-10-19: 10 g via ORAL
  Filled 2020-10-19: qty 1

## 2020-10-19 NOTE — Consult Note (Addendum)
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 Flora Lipps.   55 y/o male with history of peripheral focal chorioretinal inflammation of both eyes diagnosed in 2020.  Followed at [facility] ophthalmology.  He was treated with methotrexate and folic acid but self discontinued after 6 months in 2021  Admitted with SOB and CP. CT chest 10/12/20 negative for PE. Found to have bulky mediastinal and hilar lymphadenopathy.   Echo: EF 25% dilated. RV moderately HK. Mod-severe MR Personally reviewed  Cath 10/16/20: No CAD PA 46/19 (33) PCWP 27 Fick 6.7/3.1  cMRI: EF 18% severely dilated LVIDd 7.0 cm  Underwent bronch biopsy of mediastinal LAD on 10/18/20  Denies CP or SOB. Continues to have frequent PVCs/NSVT.  B-blocker stopped due to low BP   Review of Systems: [y] = yes, [ ]  = no   General: Weight gain [ ] ; Weight loss [ ] ; Anorexia [ ] ; Fatigue [ ] ; Fever [ ] ; Chills [ ] ; Weakness [ ]   Cardiac: Chest pain/pressure [ y]; Resting SOB [ ] ; Exertional SOB [ y]; Orthopnea [ ] ; Pedal Edema [ ] ; Palpitations ]; Syncope [ ] ; Presyncope [ ] ; Paroxysmal nocturnal dyspnea[ ]   Pulmonary: Cough [ ] ; Wheezing[ ] ; Hemoptysis[ ] ; Sputum [ ] ; Snoring [ ]   GI: Vomiting[ ] ; Dysphagia[ ] ; Melena[ ] ; Hematochezia [ ] ; Heartburn[ ] ; Abdominal pain [ ] ; Constipation [ ] ; Diarrhea [ ] ; BRBPR [ ]   GU: Hematuria[ ] ; Dysuria [ ] ; Nocturia[ ]   Vascular: Pain in legs with walking [ ] ; Pain in feet with lying flat [ ] ; Non-healing sores [ ] ; Stroke [ ] ; TIA [ ] ; Slurred speech [ ] ;  Neuro: Headaches[ ] ; Vertigo[ ] ; Seizures[ ] ; Paresthesias[ ] ;Blurred vision [ ] ; Diplopia [ ] ; Vision changes [ ]   Ortho/Skin: Arthritis [ y]; Joint pain [ y]; Muscle  pain [ ] ; Joint swelling [ ] ; Back Pain [ ] ; Rash [ ]   Psych: Depression[ ] ; Anxiety[ ]   Heme: Bleeding problems [ ] ; Clotting disorders [ ] ; Anemia [ ]   Endocrine: Diabetes [ ] ; Thyroid dysfunction[ ]   Home Medications Prior to Admission medications   Medication Sig Start Date End Date Taking? Authorizing Provider  cetirizine (ZYRTEC) 10 MG tablet Take 10 mg by mouth daily.   Yes [provider]  ibuprofen (ADVIL,MOTRIN) 200 MG tablet Take 800 mg by mouth every 6 (six) hours as needed for moderate pain.   Yes [provider]  montelukast (SINGULAIR) 10 MG tablet Take 10 mg by mouth daily. 08/30/20  Yes [provider]  omeprazole (PRILOSEC) 40 MG capsule Take 40 mg by mouth every morning. 08/30/20  Yes [provider]  sildenafil (REVATIO) 20 MG tablet Take 60 mg by mouth as needed. 04/19/20  Yes [provider]  tamsulosin (FLOMAX) 0.4 MG CAPS capsule Take 0.8 mg by mouth every other day. 10/04/20  Yes [provider]  methocarbamol (ROBAXIN) 500 MG tablet Take 1 tablet (500 mg total) by mouth 2 (two) times daily as needed for muscle spasms. Patient not taking: No sig reported 12/05/15   Ward, , PA-C  naproxen (NAPROSYN) 500 MG tablet TAKE 1 TABLET (500 MG TOTAL) BY MOUTH 3 (THREE) TIMES DAILY WITH MEALS  FOR 15 DAYS. Patient not taking: No sig reported 03/19/20 03/19/21  Liberty Handy, PA-C    Past Medical History: Past Medical History:  Diagnosis Date   Allergies    09/16/2020   BPH (benign prostatic hyperplasia)    ED (erectile dysfunction)    GERD (gastroesophageal reflux disease)    Peripheral focal chorioretinal inflammation of both eyes    Retinal vasculitis of both eyes     Past Surgical History: Past Surgical History:  Procedure Laterality Date   RIGHT/LEFT HEART CATH AND CORONARY ANGIOGRAPHY N/A 10/16/2020   Procedure: RIGHT/LEFT HEART CATH AND CORONARY ANGIOGRAPHY;  Surgeon: Marykay Lex, MD;  Location:  [facility] INVASIVE CV LAB;  Service: Cardiovascular;  Laterality: N/A;    Family History: Family History  Problem Relation Age of Onset   Cancer Father 33   Diabetes Father     Social History: Social History   Socioeconomic History   Marital status: Married    Spouse name: Not on file   Number of children: Not on file   Years of education: Not on file   Highest education level: Not on file  Occupational History   Not on file  Tobacco Use   Smoking status: Former    Packs/day: 0.50    Types: Cigarettes    Quit date: 02/02/2010    Years since quitting: 10.7   Smokeless tobacco: Never  Substance and Sexual Activity   Alcohol use: Yes   Drug use: Not Currently   Sexual activity: Not on file  Other Topics Concern   Not on file  Social History Narrative   Not on file   Social Determinants of Health   Financial Resource Strain: Not on file  Food Insecurity: Not on file  Transportation Needs: Not on file  Physical Activity: Not on file  Stress: Not on file  Social Connections: Not on file    Allergies:  No Known Allergies  Objective:    Vital Signs:   Temp:  [97.8 F (36.6 C)-98.7 F (37.1 C)] 97.8 F (36.6 C) (09/17 1134) Pulse Rate:  [88-109] 100 (09/17 1134) Resp:  [12-25] 20 (09/17 1134) BP: (84-112)/(58-95) 99/58 (09/17 1134) SpO2:  [50 %-100 %] 99 % (09/17 1134) Weight:  [99.7 kg] 99.7 kg (09/17 0437) Last BM Date: 10/16/20  Weight change: Filed Weights   10/17/20 0334 10/18/20 0657 10/19/20 0437  Weight: 101 kg 101 kg 99.7 kg    Intake/Output:   Intake/Output Summary (Last 24 hours) at 10/19/2020 1203 Last data filed at 10/19/2020 0930 Gross per 24 hour  Intake 996.67 ml  Output 1395 ml  Net -398.33 ml      Physical Exam    General:  Well appearing. No resp difficulty HEENT: normal Neck: supple. JVP flat. Carotids 2+ bilat; no bruits. No lymphadenopathy or thyromegaly appreciated. Cor: PMI nondisplaced. Tachy + ectopy No rubs, gallops or  murmurs. Lungs: clear Abdomen: soft, nontender, nondistended. No hepatosplenomegaly. No bruits or masses. Good bowel sounds. Extremities: no cyanosis, clubbing, rash, edema Neuro: alert & orientedx3, cranial nerves grossly intact. moves all 4 extremities w/o difficulty. Affect pleasant   Telemetry   Sinus 90-100 frequent PVCs and NSVT Personally reviewed  EKG    Sinus tach 122 non-specific ST + PVCs Personally reviewed   Labs   Basic Metabolic Panel: Recent Labs  Lab 10/13/20 0237 10/14/20 0327 10/15/20 1028 10/16/20 0343 10/16/20 1655 10/16/20 1703 10/16/20 1704 10/17/20 0231 10/18/20 0100 10/19/20 0157  NA 136 136 134* 134*   < >  138 139 137 135 134*  K 3.7 3.7 4.1 4.8   < > 4.4 4.3 4.2 4.5 5.7*  CL 107 106 101 103  --   --   --  106 97* 102  CO2 21* 21* 24 23  --   --   --  23 27 24   GLUCOSE 103* 102* 102* 114*  --   --   --  108* 110* 123*  BUN 13 14 13 20   --   --   --  22* 28* 27*  CREATININE 1.64* 1.55* 1.42* 1.44*  --   --   --  1.58* 1.62* 1.72*  CALCIUM 8.6* 8.7* 9.1 9.2  --   --   --  9.1 9.5 9.2  MG 1.8 1.8  --  2.1  --   --   --   --   --   --   PHOS 3.7  --   --  3.5  --   --   --   --   --   --    < > = values in this interval not displayed.    Liver Function Tests: Recent Labs  Lab 10/12/20 1252 10/13/20 0237  AST 30 25  ALT 19 16  ALKPHOS 73 63  BILITOT 1.1 1.1  PROT 7.3 6.2*  ALBUMIN 3.3* 2.8*   Recent Labs  Lab 10/12/20 1252  LIPASE 28   No results for input(s): AMMONIA in the last 168 hours.  CBC: Recent Labs  Lab 10/12/20 1224 10/13/20 0237 10/14/20 0327 10/16/20 0343 10/16/20 1655 10/16/20 1703 10/16/20 1704 10/17/20 0231  WBC 4.7 5.0 5.9 5.7  --   --   --  7.4  NEUTROABS  --   --   --   --   --   --   --  5.3  HGB 12.2* 11.2* 11.1* 11.4* 12.2* 12.2* 12.2* 11.6*  HCT 38.8* 34.6* 34.9* 34.9* 36.0* 36.0* 36.0* 36.3*  MCV 87.0 85.6 85.7 84.3  --   --   --  85.6  PLT 343 211 216 243  --   --   --  360    Cardiac  Enzymes: No results for input(s): CKTOTAL, CKMB, CKMBINDEX, TROPONINI in the last 168 hours.  BNP: BNP (last 3 results) Recent Labs    10/12/20 1253  BNP 558.3*    ProBNP (last 3 results) No results for input(s): PROBNP in the last 8760 hours.   CBG: No results for input(s): GLUCAP in the last 168 hours.  Coagulation Studies: No results for input(s): LABPROT, INR in the last 72 hours.   Imaging   No results found.   Medications:     Current Medications:  aspirin EC  81 mg Oral Daily   dapagliflozin propanediol  10 mg Oral Daily   digoxin  0.125 mg Oral Daily   docusate sodium  100 mg Oral BID   enoxaparin (LOVENOX) injection  40 mg Subcutaneous Q24H   feeding supplement  237 mL Oral BID BM   loratadine  10 mg Oral Daily   montelukast  10 mg Oral Daily   multivitamin with minerals  1 tablet Oral Daily   pantoprazole  40 mg Oral Daily   predniSONE  40 mg Oral Q breakfast   sodium chloride flush  3 mL Intravenous Q12H   sodium chloride flush  3 mL Intravenous Q12H   sodium chloride flush  3 mL Intravenous Q12H   tamsulosin  0.8 mg Oral QODAY  Infusions:  sodium chloride     sodium chloride       Assessment/Plan   1. 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 Volume status ok.  - continue digoxin 0.125 - no ACE/ARNI, spiro due to low BP and hyperkalemia - b-blocker stopped due to low BP - can start midodrine as needed for BP support - on SGLT2i - Continue prednisone. Add MTX - needs PJP prophylaxis  2. NSVT  - likely due to sarcoid - continue prednisone.  - will need LifeVest on d/c  3. AKI - due to cardiorenal/ATN - baseline SCr 1.4 -> 1.7  4. Pulmonary and cardiac sarcoid - results of biopsy pending  - treatment as above   5. 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 Burden ~ 20% -  hopefully will improve with prednisone.  - will add mexilitene - check mag.  - keep K> 4.0 mg > 2.0  6. Hyperkalemia - k 5.7 give lokelma - daily bmet - may need to stop digoxin   Length of Stay: 7  Arvilla Meres, MD  10/19/2020, 12:03 PM  Advanced Heart Failure Team Pager [phone] (M-F; 7a - 5p)  Please contact [facility] for night-coverage after hours (4p -7a ) and weekends on [email or web address]

## 2020-10-19 NOTE — Progress Notes (Signed)
Mobility Specialist: Progress Note   10/19/20 1353  Mobility  Activity Ambulated in hall  Level of Assistance Independent  Assistive Device None  Distance Ambulated (ft) 900 ft  Mobility Ambulated independently in hallway  Mobility Response Tolerated well  Mobility performed by Mobility specialist  $Mobility charge 1 Mobility   Pre-Mobility: 108 HR During Mobility: 128 HR Post-Mobility: 118 HR, 121/78 BP, 100% SpO2  Pt to BR and then agreeable to ambulation, void successful. Pt asx throughout ambulation. Pt sitting EOB with call bell at his side and pt's mother present in the room.   [facility] [place] Mobility Specialist Mobility Specialist Phone: [phone]

## 2020-10-19 NOTE — Progress Notes (Signed)
Cardiology Progress Note  Patient ID: Nathan [name] MRN: [id number] DOB: [birth year] Date of Encounter: 10/19/2020  Primary Cardiologist: Chilton Si, MD  Subjective   Chief Complaint: Shortness of breath  HPI: Euvolemic.  Remains tachycardic.  Frequent PVCs seen on the monitor.  No chest pain.  Remains hypotensive.  Holding beta-blocker.  Starting digoxin.  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  ROS:  All other ROS reviewed and negative. Pertinent positives noted in the HPI.     Inpatient Medications  Scheduled Meds:  aspirin EC  81 mg Oral Daily   dapagliflozin propanediol  10 mg Oral Daily   digoxin  0.125 mg Oral Daily   docusate sodium  100 mg Oral BID   enoxaparin (LOVENOX) injection  40 mg Subcutaneous Q24H   feeding supplement  237 mL Oral BID BM   loratadine  10 mg Oral Daily   montelukast  10 mg Oral Daily   multivitamin with minerals  1 tablet Oral Daily   pantoprazole  40 mg Oral Daily   predniSONE  40 mg Oral Q breakfast   sodium chloride flush  3 mL Intravenous Q12H   sodium chloride flush  3 mL Intravenous Q12H   sodium chloride flush  3 mL Intravenous Q12H   tamsulosin  0.8 mg Oral QODAY   Continuous Infusions:  sodium chloride     sodium chloride     PRN Meds: sodium chloride, sodium chloride, acetaminophen **OR** acetaminophen, albuterol, methocarbamol, morphine injection, nitroGLYCERIN, ondansetron **OR** ondansetron (ZOFRAN) IV, oxyCODONE-acetaminophen, polyethylene glycol, sodium chloride flush, sodium chloride flush   Vital Signs   Vitals:   10/19/20 0437 10/19/20 0625 10/19/20 0929 10/19/20 1134  BP: 112/81 108/81 99/72 (!) 99/58  Pulse:  (!) 109 (!) 105 100  Resp: 19 17 18 20   Temp: 98 F (36.7 C) 98.4 F (36.9 C) 98 F (36.7 C) 97.8 F (36.6 C)  TempSrc: Oral Oral Oral Oral  SpO2: 100% 100% 99% 99%  Weight: 99.7 kg     Height:        Intake/Output Summary (Last 24 hours) at 10/19/2020  1149 Last data filed at 10/19/2020 0930 Gross per 24 hour  Intake 996.67 ml  Output 1395 ml  Net -398.33 ml   Last 3 Weights 10/19/2020 10/18/2020 10/17/2020  Weight (lbs) 219 lb 12.8 oz 222 lb 10.6 oz 222 lb 10.6 oz  Weight (kg) 99.7 kg 101 kg 101 kg      Telemetry  Overnight telemetry shows sinus tachycardia, frequent PVCs, which I personally reviewed.   ECG  The most recent ECG shows sinus tachycardia heart rate 127, PVCs, which I personally reviewed.   Physical Exam   Vitals:   10/19/20 0437 10/19/20 0625 10/19/20 0929 10/19/20 1134  BP: 112/81 108/81 99/72 (!) 99/58  Pulse:  (!) 109 (!) 105 100  Resp: 19 17 18 20   Temp: 98 F (36.7 C) 98.4 F (36.9 C) 98 F (36.7 C) 97.8 F (36.6 C)  TempSrc: Oral Oral Oral Oral  SpO2: 100% 100% 99% 99%  Weight: 99.7 kg     Height:        Intake/Output Summary (Last 24 hours) at 10/19/2020 1149 Last data filed at 10/19/2020 0930 Gross per 24 hour  Intake 996.67 ml  Output 1395 ml  Net -398.33 ml    Last 3 Weights 10/19/2020 10/18/2020 10/17/2020  Weight (lbs) 219 lb 12.8 oz 222 lb 10.6 oz 222 lb 10.6 oz  Weight (kg) 99.7 kg 101 kg 101 kg    Body mass index is 31.54 kg/m.   General: Well nourished, well developed, in no acute distress Head: Atraumatic, normal size  Eyes: PEERLA, EOMI  Neck: Supple, JVD 5 to 7 cm of water Endocrine: No thryomegaly Cardiac: Normal S1, S2; tachycardia, 2 out of 6 holosystolic murmur Lungs: Clear to auscultation bilaterally, no wheezing, rhonchi or rales  Abd: Soft, nontender, no hepatomegaly  Ext: No edema, pulses 2+ Musculoskeletal: No deformities, BUE and BLE strength normal and equal Skin: Warm and dry, no rashes   Neuro: Alert and oriented to person, place, time, and situation, CNII-XII grossly intact, no focal deficits  Psych: Normal mood and affect   Labs  High Sensitivity Troponin:   Recent Labs  Lab 10/12/20 1424 10/13/20 0237 10/13/20 0524 10/14/20 0549 10/14/20 0820   TROPONINIHS 164* 154* 193* 129* 120*     Cardiac EnzymesNo results for input(s): TROPONINI in the last 168 hours. No results for input(s): TROPIPOC in the last 168 hours.  Chemistry Recent Labs  Lab 10/12/20 1252 10/13/20 0237 10/14/20 0327 10/17/20 0231 10/18/20 0100 10/19/20 0157  NA  --  136   < > 137 135 134*  K  --  3.7   < > 4.2 4.5 5.7*  CL  --  107   < > 106 97* 102  CO2  --  21*   < > 23 27 24   GLUCOSE  --  103*   < > 108* 110* 123*  BUN  --  13   < > 22* 28* 27*  CREATININE  --  1.64*   < > 1.58* 1.62* 1.72*  CALCIUM  --  8.6*   < > 9.1 9.5 9.2  PROT 7.3 6.2*  --   --   --   --   ALBUMIN 3.3* 2.8*  --   --   --   --   AST 30 25  --   --   --   --   ALT 19 16  --   --   --   --   ALKPHOS 73 63  --   --   --   --   BILITOT 1.1 1.1  --   --   --   --   GFRNONAA  --  50*   < > 52* 50* 47*  ANIONGAP  --  8   < > 8 11 8    < > = values in this interval not displayed.    Hematology Recent Labs  Lab 10/14/20 0327 10/16/20 0343 10/16/20 1655 10/16/20 1703 10/16/20 1704 10/17/20 0231  WBC 5.9 5.7  --   --   --  7.4  RBC 4.07* 4.14*  --   --   --  4.24  HGB 11.1* 11.4*   < > 12.2* 12.2* 11.6*  HCT 34.9* 34.9*   < > 36.0* 36.0* 36.3*  MCV 85.7 84.3  --   --   --  85.6  MCH 27.3 27.5  --   --   --  27.4  MCHC 31.8 32.7  --   --   --  32.0  RDW 15.2 14.9  --   --   --  15.2  PLT 216 243  --   --   --  360   < > = values in this interval not displayed.   BNP Recent Labs  Lab 10/12/20 1253  BNP 558.3*    DDimer  Recent Labs  Lab 10/12/20 1252  DDIMER 1.68*     Radiology  No results found.  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 Non-cardiac findings discussed with primary and pulmonology teams.  TTE 10/13/2020  1. Left ventricular ejection fraction, by estimation, is 30 to 35%. The  left ventricle has moderately decreased function. The left ventricle  demonstrates global  hypokinesis. The left ventricular internal cavity size  was severely dilated. Left  ventricular diastolic parameters are indeterminate.   2. Right ventricular systolic function is normal. The right ventricular  size is normal. There is moderately elevated pulmonary artery systolic  pressure.   3. Left atrial size was moderately dilated.   4. The mitral valve is normal in structure. Severe mitral valve  regurgitation. No evidence of mitral stenosis.   5. The aortic valve is tricuspid. Aortic valve regurgitation is trivial.  Mild aortic valve sclerosis is present, with no evidence of aortic valve  stenosis.   6. The inferior vena cava is normal in size with greater than 50%  respiratory variability, suggesting right atrial pressure of 3 mmHg.   Patient Profile  Nathan [name] is a 55 y.o. male with out medical history who was admitted on 10/03/2020 for acute systolic heart failure.  Found to have diffuse lymphadenopathy concerning for sarcoidosis.  Cardiac MRI is consistent with cardiac sarcoid.  Assessment & Plan   #Acute systolic heart failure, EF 20-25% #Concerns for cardiac sarcoidosis -Admitted with several weeks of shortness of breath.  Echocardiogram shows EF around 25% on my review with regional wall motion normalities. -Left heart catheterization with no evidence of CAD. -Right heart cath shows he is not in cardiogenic shock.  Was volume overloaded. -He has remained persistently tachycardic and kidney function continues to decline.  I will hold his beta-blocker.  We will start him on digoxin 0.125 mg daily.  We will be cautious of this with his kidney function. -I am a bit worried that he may not tolerate guideline directed medical therapy.  I discussed his case with advanced heart failure and they will see him today. -I did review his cardiac MRI and he does have a gadolinium enhancement in the basal septum as well as significant involvement in the anterior lateral basal segments.   This appears to spare the subendocardium which is more consistent with cardiac sarcoidosis. -Hold further diuresis.  Net -5.6 L. -Do not think he will tolerate hydralazine and Imdur currently.  We will try to get his blood pressure better.  Advanced heart failure will help [place] as well.  #MR -secondary -needs aggressive CHF treatment   #CKD III -hold further diuresis  #PVCs -very frequent. May need amiodarone. On steroids.  -really concerning for active inflammation.   For questions or updates, please contact [facility] Please consult [email or web address] for contact info under   Time Spent with Patient: I have spent a total of 35 minutes with patient reviewing hospital notes, telemetry, EKGs, labs and examining the patient as well as establishing an assessment and plan that was discussed with the patient.  > 50% of time was spent in direct patient care.    Signed, Lenna Gilford. Flora Lipps, MD, [facility] [place]  [facility]  10/19/2020 11:49 AM

## 2020-10-19 NOTE — Progress Notes (Signed)
A.m. potassium 5.7.  P.o. potassium discontinued

## 2020-10-19 NOTE — Progress Notes (Signed)
PROGRESS NOTE  Nathan [name] GEX:[id number] DOB: 06-13-1965 DOA: 10/12/2020 PCP: Hayden Rasmussen, MD  HPI/Recap of past 24 hours: Patient seen and examined at bedside.  Denies any complaints today  Assessment/Plan: Principal Problem:   Shortness of breath Active Problems:   Pneumonia   Chest pain   Acute combined systolic and diastolic heart failure (HCC)   Sarcoidosis   Severe mitral regurgitation   Pleural effusion   Thoracic lymphadenopathy   Dilated cardiomyopathy (HCC) Pneumonia..  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 He was started on prednisone  Acute combined systolic and diastolic heart failure Echocardiogram performed October 13, 2020 demonstrated EF of 30 to 35% with global hypokinesia with a severely dilated left ventricle cavity  Chest pain resolved likely due to multifocal pneumonia  Severe mitral regurgitation: Patient underwent left and right cardiac catheterization on December 16, 2020 to evaluate for possible mitral valve repair Cardiology opted for medical management at this point      Code Status: Full  Severity of Illness: The appropriate patient status for this patient is INPATIENT. Inpatient status is judged to be reasonable and necessary in order to provide the required intensity of service to ensure the patient's safety. The patient's presenting symptoms, physical exam findings, and initial radiographic and  laboratory data in the context of their chronic comorbidities is felt to place them at high risk for further clinical deterioration. Furthermore, it is not anticipated that the patient will be medically stable for discharge from the hospital within 2 midnights of admission. The following factors support the patient status of inpatient.    * I certify that at the point of admission it is my clinical judgment that the patient will require inpatient hospital care spanning beyond 2 midnights from the point of admission due to high intensity of service, high risk for further deterioration and high frequency of surveillance required.*   Family Communication: Mother at bedside  Disposition Plan: Home Status is: Inpatient   Dispo: The patient is from: Home              Anticipated d/c is to: Home              Anticipated d/c date is:               Patient currently not medically stable for discharge  Consultants: Critical care Cardiology  Procedures: Video bronchoscopy with endobronchial ultrasound/needle aspiration bronchial washing Right and left heart catheterization  Antimicrobials: None Completed a Zithromax and Rocephin on October 18, 2020  DVT prophylaxis: Lovenox   Objective: Vitals:   10/18/20 2340 10/19/20 0437 10/19/20 0625 10/19/20 0929  BP: 97/76 112/81 108/81 99/72  Pulse: 100  (!) 109 (!) 105  Resp: _0 Temp: 97.9 F (36.6 C) 98 F (36.7 C) 98.4 F (36.9 C) 98 F (36.7 C)  TempSrc: Oral Oral Oral Oral  SpO2: 100% 100% 100% 99%  Weight:  99.7 kg    Height:        Intake/Output  Summary (Last 24 hours) at 10/19/2020 1003 Last data filed at 10/19/2020 0930 Gross per 24 hour  Intake 996.67 ml  Output 1395 ml  Net -398.33 ml   Filed Weights   10/17/20 0334 10/18/20 0657 10/19/20 0437  Weight: 101 kg 101 kg 99.7 kg   Body mass index is 31.54 kg/m.  Exam:  General: 55 y.o. year-old male well developed well nourished in no acute distress.  Alert  and oriented x3.  Sitting at the side of the bed Cardiovascular: Regular rate and rhythm with no rubs or gallops.  No thyromegaly or JVD noted.   Respiratory: Clear to auscultation with no wheezes or rales. Good inspiratory effort. Abdomen: Soft nontender nondistended with normal bowel sounds x4 quadrants. Musculoskeletal: No lower extremity edema. 2/4 pulses in all 4 extremities. Skin: No ulcerative lesions noted or rashes, Psychiatry: Mood is appropriate for condition and setting Neurology:    Data Reviewed: CBC: Recent Labs  Lab 10/12/20 1224 10/13/20 0237 10/14/20 0327 10/16/20 0343 10/16/20 1655 10/16/20 1703 10/16/20 1704 10/17/20 0231  WBC 4.7 5.0 5.9 5.7  --   --   --  7.4  NEUTROABS  --   --   --   --   --   --   --  5.3  HGB 12.2* 11.2* 11.1* 11.4* 12.2* 12.2* 12.2* 11.6*  HCT 38.8* 34.6* 34.9* 34.9* 36.0* 36.0* 36.0* 36.3*  MCV 87.0 85.6 85.7 84.3  --   --   --  85.6  PLT 343 211 216 243  --   --   --  017   Basic Metabolic Panel: Recent Labs  Lab 10/13/20 0237 10/14/20 0327 10/15/20 1028 10/16/20 0343 10/16/20 1655 10/16/20 1703 10/16/20 1704 10/17/20 0231 10/18/20 0100 10/19/20 0157  NA 136 136 134* 134*   < > 138 139 137 135 134*  K 3.7 3.7 4.1 4.8   < > 4.4 4.3 4.2 4.5 5.7*  CL 107 106 101 103  --   --   --  106 97* 102  CO2 21* 21* 24 23  --   --   --  _0 GLUCOSE 103* 102* 102* 114*  --   --   --  108* 110* 123*  BUN _1 --   --   --  22* 28* 27*  CREATININE 1.64* 1.55* 1.42* 1.44*  --   --   --  1.58* 1.62* 1.72*  CALCIUM 8.6* 8.7* 9.1 9.2  --   --   --  9.1 9.5 9.2  MG 1.8 1.8  --  2.1  --   --   --   --   --   --   PHOS 3.7  --   --  3.5  --   --   --   --   --   --    < > = values in this interval not displayed.   GFR: Estimated Creatinine Clearance: 58.8 mL/min (A) (by C-G formula based on SCr of 1.72 mg/dL (H)). Liver Function Tests: Recent Labs  Lab 10/12/20 1252 10/13/20 0237  AST 30 25  ALT 19 16  ALKPHOS 73 63   BILITOT 1.1 1.1  PROT 7.3 6.2*  ALBUMIN 3.3* 2.8*   Recent Labs  Lab 10/12/20 1252  LIPASE 28   No results for input(s): AMMONIA in the last 168 hours. Coagulation Profile: No results for input(s): INR, PROTIME in the last 168 hours. Cardiac Enzymes: No results for  input(s): CKTOTAL, CKMB, CKMBINDEX, TROPONINI in the last 168 hours. BNP (last 3 results) No results for input(s): PROBNP in the last 8760 hours. HbA1C: No results for input(s): HGBA1C in the last 72 hours. CBG: No results for input(s): GLUCAP in the last 168 hours. Lipid Profile: No results for input(s): CHOL, HDL, LDLCALC, TRIG, CHOLHDL, LDLDIRECT in the last 72 hours. Thyroid Function Tests: No results for input(s): TSH, T4TOTAL, FREET4, T3FREE, THYROIDAB in the last 72 hours. Anemia Panel: No results for input(s): VITAMINB12, FOLATE, FERRITIN, TIBC, IRON, RETICCTPCT in the last 72 hours. Urine analysis: No results found for: COLORURINE, APPEARANCEUR, LABSPEC, [place], GLUCOSEU, HGBUR, BILIRUBINUR, KETONESUR, PROTEINUR, UROBILINOGEN, NITRITE, LEUKOCYTESUR Sepsis Labs: _0 (procalcitonin:4,lacticidven:4)  ) Recent Results (from the past 240 hour(s))  Resp Panel by RT-PCR (Flu A&B, Covid) Nasopharyngeal Swab     Status: None   Collection Time: 10/12/20  1:20 PM   Specimen: Nasopharyngeal Swab; Nasopharyngeal(NP) swabs in vial transport medium  Result Value Ref Range Status   SARS Coronavirus 2 by RT PCR NEGATIVE NEGATIVE Final    Comment: (NOTE) SARS-CoV-2 target nucleic acids are NOT DETECTED.  The SARS-CoV-2 RNA is generally detectable in upper respiratory specimens during the acute phase of infection. The lowest concentration of SARS-CoV-2 viral copies this assay can detect is 138 copies/mL. A negative result does not preclude SARS-Cov-2 infection and should not be used as the sole basis for treatment or other patient management decisions. A negative result may occur with  improper specimen  collection/handling, submission of specimen other than nasopharyngeal swab, presence of viral mutation(s) within the areas targeted by this assay, and inadequate number of viral copies(<138 copies/mL). A negative result must be combined with clinical observations, patient history, and epidemiological information. The expected result is Negative.  Fact Sheet for Patients:  EntrepreneurPulse.com.au  Fact Sheet for Healthcare Providers:  IncredibleEmployment.be  This test is no t yet approved or cleared by the [place] FDA and  has been authorized for detection and/or diagnosis of SARS-CoV-2 by FDA under an Emergency Use Authorization (EUA). This EUA will remain  in effect (meaning this test can be used) for the duration of the COVID-19 declaration under Section 564(b)(1) of the Act, 21 U.S.C.section 360bbb-3(b)(1), unless the authorization is terminated  or revoked sooner.       Influenza A by PCR NEGATIVE NEGATIVE Final   Influenza B by PCR NEGATIVE NEGATIVE Final    Comment: (NOTE) The Xpert Xpress SARS-CoV-2/FLU/RSV plus assay is intended as an aid in the diagnosis of influenza from Nasopharyngeal swab specimens and should not be used as a sole basis for treatment. Nasal washings and aspirates are unacceptable for Xpert Xpress SARS-CoV-2/FLU/RSV testing.  Fact Sheet for Patients: EntrepreneurPulse.com.au  Fact Sheet for Healthcare Providers: IncredibleEmployment.be  This test is not yet approved or cleared by the [place] FDA and has been authorized for detection and/or diagnosis of SARS-CoV-2 by FDA under an Emergency Use Authorization (EUA). This EUA will remain in effect (meaning this test can be used) for the duration of the COVID-19 declaration under Section 564(b)(1) of the Act, 21 U.S.C. section 360bbb-3(b)(1), unless the authorization is terminated or revoked.  Performed at [facility], [place] [address] [place], [place] [address]   Aerobic/Anaerobic Culture w Gram Stain (surgical/deep wound)     Status: None (Preliminary result)   Collection Time: 10/18/20 12:07 PM   Specimen: PATH Respiratory biopsy; Tissue  Result Value Ref Range Status   Specimen Description FLUID  Final   Special  Requests RESPI BX STATION 7 SPEC B  Final   Gram Stain   Final    NO WBC SEEN NO ORGANISMS SEEN Performed at [facility], [place] [address], [place], [place] [address]    Culture PENDING  Incomplete   Report Status PENDING  Incomplete  Culture, BAL-quantitative w Gram Stain     Status: None (Preliminary result)   Collection Time: 10/18/20 12:11 PM   Specimen: PATH Respiratory biopsy; Tissue  Result Value Ref Range Status   Specimen Description TISSUE  Final   Special Requests RESPI BX STATION 7 SPEC C  Final   Gram Stain   Final    ABUNDANT WBC PRESENT,BOTH PMN AND MONONUCLEAR NO ORGANISMS SEEN    Culture   Final    NO GROWTH < 24 HOURS Performed at [facility], [place] [address], [place], [place] [address]    Report Status PENDING  Incomplete  Anaerobic culture w Gram Stain     Status: None (Preliminary result)   Collection Time: 10/18/20 12:11 PM   Specimen: Tissue  Result Value Ref Range Status   Specimen Description TISSUE  Final   Special Requests RESP BIOSPY STATION 7 SPEC C  Final   Gram Stain   Final    RARE WBC PRESENT, PREDOMINANTLY MONONUCLEAR NO ORGANISMS SEEN Performed at [facility], [place] [address] [place], [place] [address]    Culture PENDING  Incomplete   Report Status PENDING  Incomplete      Studies: No results found.  Scheduled Meds:  aspirin EC  81 mg Oral Daily   carvedilol  3.125 mg Oral BID WC   docusate sodium  100 mg Oral BID   enoxaparin (LOVENOX) injection  40 mg Subcutaneous Q24H   feeding supplement  237 mL Oral BID BM   loratadine  10 mg Oral Daily   montelukast  10 mg Oral Daily   multivitamin with  minerals  1 tablet Oral Daily   pantoprazole  40 mg Oral Daily   predniSONE  40 mg Oral Q breakfast   sodium chloride flush  3 mL Intravenous Q12H   sodium chloride flush  3 mL Intravenous Q12H   sodium chloride flush  3 mL Intravenous Q12H   tamsulosin  0.8 mg Oral QODAY    Continuous Infusions:  sodium chloride     sodium chloride       LOS: 7 days     Cristal Deer, MD [facility]  To reach me or the doctor on call, go to: [email or web address] Password [email or web address]  10/19/2020, 10:03 AM

## 2020-10-19 NOTE — Progress Notes (Addendum)
NAME:  Nathan [name], MRN:  [id number], DOB:  25-Dec-1965, LOS: 7 ADMISSION DATE:  10/12/2020, CONSULTATION DATE:  10/12/2020 REFERRING MD:  Joycelyn Das MD, CHIEF COMPLAINT:  Abnormal CT scan   History of Present Illness:  55 Y/O with remote smoking history, peripheral focal chorioretinal inflammation eyes  Admitted with dyspnea, chest discomfort.  CT scan on admission significant for small bilateral pleural effusion, mediastinal lymphadenopathy Found to have EF 30 to 35% with severe MR  Pertinent  Medical History    has a past medical history of Allergies, BPH (benign prostatic hyperplasia), ED (erectile dysfunction), GERD (gastroesophageal reflux disease), Peripheral focal chorioretinal inflammation of both eyes, and Retinal vasculitis of both eyes.  He has history of peripheral focal chorioretinal inflammation of both eyes diagnosed in 2020.  Followed at [facility] ophthal[facility].  He was treated with methotrexate and folic acid but self discontinued after 6 months in 2021.  He has not followed up with his ophthalmologist.  Remote smoker in his 20 to 30s  Has a [employer] company.  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  No pets  Significant Hospital Events:   9/10 admit 9/16 EBUS with BI to biopsy mediastinal adenopathy   Interim History / Subjective:  States he feels well with no acute complaints  No issues overnight   Objective   Blood pressure 99/72, pulse (!) 105, temperature 98 F (36.7 C), temperature source Oral, resp. rate 18, height 5\' 10"  (1.778 m), weight 99.7 kg, SpO2 99 %.        Intake/Output Summary (Last 24 hours) at 10/19/2020 0945 Last data filed at 10/19/2020 0930 Gross per 24 hour  Intake 996.67 ml  Output 1395 ml  Net -398.33 ml    Filed Weights   10/17/20 0334 10/18/20 0657 10/19/20 0437  Weight: 101 kg 101 kg 99.7 kg    Examination: General: Pleasant middle aged gentleman lying in bed in NAD HEENT: [place]/AT, MM  pink/moist, PERRL,  Neuro: Alert and oriented x3, non-focal  CV: s1s2 regular rate and rhythm, no murmur, rubs, or gallops,  PULM:  Clear to ascultation bilaterally, no increased work of breathing, no added breath sounds  GI: soft, bowel sounds active in all 4 quadrants, non-tender, non-distended, tolerating oral diet  Extremities: warm/dry, no edema  Skin: no rashes or lesions  Imagine  CT significant for bulky mediastinal, bilateral hilar lymphadenopathy, groundglass opacity bilaterally with bilateral pleural effusion.  Resolved Hospital Problem list     Assessment & Plan:  Bilateral mediastinal and hilar lymphadenopathy -Suspect either sarcoidosis vs lymphoproliferative disorder vs infection vs reactive LN in heart failure.  Sarcoidosis is of concern given history of chorioretinal inflammation 3 years ago for which he was treated with methotrexate for 6 months (self discontinued when he lost insurance) and new onset non-ischemic heart failure. 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 Please call if needed before   Signature:  Whitney D. 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 Pathology pending.   BP (!) 99/58 (BP Location: Left Arm)   Pulse (!) 120   Temp 97.8 F (36.6 C) (Oral)   Resp 20    Ht 5\' 10"  (1.778 m)   Wt 99.7 kg   SpO2 99%   BMI 31.54 kg/m   Gen: comfortable resting in bed  HENT: Tracking appropriately  Heart: RRR, s1 s2 Lungs: CTAB no wheeze  Abd: Soft, nt nd   Labs reviewed   A:  Mediastinal adenopathy  Likely sarcoidosis   P:  Await final path Continue 40mg  prednisone  Follow up planned in pulmonary clinic  , DO [facility] 10/19/2020 3:20 PM

## 2020-10-20 ENCOUNTER — Encounter (HOSPITAL_COMMUNITY): Payer: Self-pay | Admitting: Pulmonary Disease

## 2020-10-20 DIAGNOSIS — R0602 Shortness of breath: Secondary | ICD-10-CM | POA: Diagnosis not present

## 2020-10-20 LAB — BASIC METABOLIC PANEL
Anion gap: 8 (ref 5–15)
BUN: 24 mg/dL — ABNORMAL HIGH (ref 6–20)
CO2: 24 mmol/L (ref 22–32)
Calcium: 9.1 mg/dL (ref 8.9–10.3)
Chloride: 103 mmol/L (ref 98–111)
Creatinine, Ser: 1.48 mg/dL — ABNORMAL HIGH (ref 0.61–1.24)
GFR, Estimated: 56 mL/min — ABNORMAL LOW (ref >60)
Glucose, Bld: 133 mg/dL — ABNORMAL HIGH (ref 70–99)
Potassium: 4.1 mmol/L (ref 3.5–5.1)
Sodium: 135 mmol/L (ref 135–145)

## 2020-10-20 LAB — CULTURE, BAL-QUANTITATIVE W GRAM STAIN: Culture: NO GROWTH

## 2020-10-20 LAB — MAGNESIUM: Magnesium: 2.3 mg/dL (ref 1.7–2.4)

## 2020-10-20 MED ORDER — ISOSORB DINITRATE-HYDRALAZINE 20-37.5 MG PO TABS
1.0000 | ORAL_TABLET | Freq: Three times a day (TID) | ORAL | Status: DC
  Administered 2020-10-20 – 2020-10-21 (×3): 1 via ORAL
  Filled 2020-10-20 (×3): qty 1

## 2020-10-20 NOTE — Progress Notes (Signed)
PROGRESS NOTE  Nathan [name] OVZ:[id number] DOB: 05-Jul-1965 DOA: 10/12/2020 PCP: Hayden Rasmussen, MD  HPI/Recap of past 24 hours: Patient seen and examined at bedside.  Denies any complaints today  Update: September 18: Patient seen and examined at bedside he denies any complaints Nurse reported patient was able to walk with physical therapy but he was going into PVCs and V. tach during the exercise.  Also complained of dizziness getting up to the bathroom.  Assessment/Plan: Principal Problem:   Shortness of breath Active Problems:   Pneumonia   Chest pain   Acute combined systolic and diastolic heart failure (HCC)   Sarcoidosis   Severe mitral regurgitation   Pleural effusion   Thoracic lymphadenopathy   Dilated cardiomyopathy (HCC) Pneumonia..  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 He was started on prednisone Biopsy was obtained today waiting on results of biopsy  Acute combined systolic and diastolic heart failure with ejection fraction of 18% by cardiac MRI done on October 17, 2020 Cardiac sarcoidosis Cardiology is following as well as pulmonary Echocardiogram performed October 13, 2020 demonstrated EF of 30 to 35% with global hypokinesia with a severely dilated left ventricle cavity   Chest pain Resolved likely due to multifocal pneumonia  Severe mitral regurgitation: Patient underwent left and right cardiac catheterization on  December 16, 2020 to evaluate for possible mitral valve repair Cardiology opted for medical management at this point      Code Status: Full  Severity of Illness: The appropriate patient status for this patient is INPATIENT. Inpatient status is judged to be reasonable and necessary in order to provide the required intensity of service to ensure the patient's safety. The patient's presenting symptoms, physical exam findings, and initial radiographic and laboratory data in the context of their chronic comorbidities is felt to place them at high risk for further clinical deterioration. Furthermore, it is not anticipated that the patient will be medically stable for discharge from the hospital within 2 midnights of admission. The following factors support the patient status of inpatient.    * I certify that at the point of admission it is my clinical judgment that the patient will require inpatient hospital care spanning beyond 2 midnights from the point of admission due to high intensity of service, high risk for further deterioration and high frequency of surveillance required.*   Family Communication: Mother at bedside  Disposition Plan: Home Status is: Inpatient   Dispo: The patient is from: Home              Anticipated d/c is to: Home              Anticipated d/c date is:               Patient currently not medically stable for discharge  Consultants: Critical care Cardiology  Procedures: Video bronchoscopy with endobronchial ultrasound/needle aspiration bronchial washing Right and left heart catheterization  Antimicrobials: None Completed a Zithromax and Rocephin on October 18, 2020  DVT prophylaxis: Lovenox   Objective:  Vitals:   10/20/20 0847 10/20/20 0906 10/20/20 1353 10/20/20 1648  BP:  102/69 107/75 108/71  Pulse:  89 (!) 112 (!) 101  Resp: _0 Temp:  98.1 F (36.7 C) 98.5 F (36.9 C) 97.9 F (36.6 C)  TempSrc:  Oral Oral Oral  SpO2:  100% 97%  100%  Weight:      Height:       No intake or output data in the 24 hours ending 10/20/20 1836  Filed Weights   10/18/20 0657 10/19/20 0437 10/20/20 0600  Weight: 101 kg 99.7 kg 97.1 kg   Body mass index is 30.71 kg/m.  Exam:  General: 55 y.o. year-old male well developed well nourished in no acute distress.  Alert and oriented x3.  Sitting at the side of the bed Cardiovascular: Regular rate and rhythm with no rubs or gallops.  No thyromegaly or JVD noted.   Respiratory: Clear to auscultation with no wheezes or rales. Good inspiratory effort. Abdomen: Soft nontender nondistended with normal bowel sounds x4 quadrants. Musculoskeletal: No lower extremity edema. 2/4 pulses in all 4 extremities. Skin: No ulcerative lesions noted or rashes, Psychiatry: Mood is appropriate for condition and setting Neurology:    Data Reviewed: CBC: Recent Labs  Lab 10/14/20 0327 10/16/20 0343 10/16/20 1655 10/16/20 1703 10/16/20 1704 10/17/20 0231  WBC 5.9 5.7  --   --   --  7.4  NEUTROABS  --   --   --   --   --  5.3  HGB 11.1* 11.4* 12.2* 12.2* 12.2* 11.6*  HCT 34.9* 34.9* 36.0* 36.0* 36.0* 36.3*  MCV 85.7 84.3  --   --   --  85.6  PLT 216 243  --   --   --  703    Basic Metabolic Panel: Recent Labs  Lab 10/14/20 0327 10/15/20 1028 10/16/20 0343 10/16/20 1655 10/17/20 0231 10/18/20 0100 10/19/20 0157 10/19/20 1912 10/20/20 0138  NA 136   < > 134*   < > 137 135 134* 134* 135  K 3.7   < > 4.8   < > 4.2 4.5 5.7* 4.5 4.1  CL 106   < > 103  --  106 97* 102 103 103  CO2 21*   < > 23  --  _1 GLUCOSE 102*   < > 114*  --  108* 110* 123* 141* 133*  BUN 14   < > 20  --  22* 28* 27* 25* 24*  CREATININE 1.55*   < > 1.44*  --  1.58* 1.62* 1.72* 1.52* 1.48*  CALCIUM 8.7*   < > 9.2  --  9.1 9.5 9.2 9.0 9.1  MG 1.8  --  2.1  --   --   --   --   --  2.3  PHOS  --   --  3.5  --   --   --   --   --   --    < > = values in this interval not displayed.    GFR: Estimated  Creatinine Clearance: 67.4 mL/min (A) (by C-G formula based on SCr of 1.48 mg/dL (H)). Liver Function Tests: No results for input(s): AST, ALT, ALKPHOS, BILITOT, PROT, ALBUMIN in the last 168 hours.  No results for input(s): LIPASE, AMYLASE in the last 168 hours.  No results for input(s): AMMONIA in the last 168 hours. Coagulation Profile: No results for input(s): INR, PROTIME in the last 168 hours.  Cardiac Enzymes: No results for input(s): CKTOTAL, CKMB, CKMBINDEX, TROPONINI in the last 168 hours. BNP (last 3 results) No results for input(s): PROBNP in the last 8760 hours. HbA1C: No results for input(s): HGBA1C in the last 72 hours. CBG: No results for input(s): GLUCAP in the last 168 hours. Lipid Profile: No results for input(s): CHOL, HDL, LDLCALC, TRIG, CHOLHDL, LDLDIRECT in the last 72 hours. Thyroid Function Tests: No results for input(s): TSH, T4TOTAL, FREET4, T3FREE, THYROIDAB in the last 72 hours. Anemia Panel: No results for input(s): VITAMINB12, FOLATE, FERRITIN, TIBC, IRON, RETICCTPCT in the last 72 hours. Urine analysis: No results found for: COLORURINE, APPEARANCEUR, LABSPEC, Hand, GLUCOSEU, HGBUR, BILIRUBINUR, KETONESUR, PROTEINUR, UROBILINOGEN, NITRITE, LEUKOCYTESUR Sepsis Labs: _0 (procalcitonin:4,lacticidven:4)  ) Recent Results (from the past 240 hour(s))  Resp Panel by RT-PCR (Flu A&B, Covid) Nasopharyngeal Swab     Status: None   Collection Time: 10/12/20  1:20 PM   Specimen: Nasopharyngeal Swab; Nasopharyngeal(NP) swabs in vial transport medium  Result Value Ref Range Status   SARS Coronavirus 2 by RT PCR NEGATIVE NEGATIVE Final    Comment: (NOTE) SARS-CoV-2 target nucleic acids are NOT DETECTED.  The SARS-CoV-2 RNA is generally detectable in upper respiratory specimens during the acute phase of infection. The lowest concentration of SARS-CoV-2 viral copies this assay can detect is 138 copies/mL. A negative result does not preclude  SARS-Cov-2 infection and should not be used as the sole basis for treatment or other patient management decisions. A negative result may occur with  improper specimen collection/handling, submission of specimen other than nasopharyngeal swab, presence of viral mutation(s) within the areas targeted by this assay, and inadequate number of viral copies(<138 copies/mL). A negative result must be combined with clinical observations, patient history, and epidemiological information. The expected result is Negative.  Fact Sheet for Patients:  EntrepreneurPulse.com.au  Fact Sheet for Healthcare Providers:  IncredibleEmployment.be  This test is no t yet approved or cleared by the [place] FDA and  has been authorized for detection and/or diagnosis of SARS-CoV-2 by FDA under an Emergency Use Authorization (EUA). This EUA will remain  in effect (meaning this test can be used) for the duration of the COVID-19 declaration under Section 564(b)(1) of the Act, 21 U.S.C.section 360bbb-3(b)(1), unless the authorization is terminated  or revoked sooner.       Influenza A by PCR NEGATIVE NEGATIVE Final   Influenza B by PCR NEGATIVE NEGATIVE Final    Comment: (NOTE) The Xpert Xpress SARS-CoV-2/FLU/RSV plus assay is intended as an aid in the diagnosis of influenza from Nasopharyngeal swab specimens and should not be used as a sole basis for treatment. Nasal washings and aspirates are unacceptable for Xpert Xpress SARS-CoV-2/FLU/RSV testing.  Fact Sheet for Patients: EntrepreneurPulse.com.au  Fact Sheet for Healthcare Providers: IncredibleEmployment.be  This test is not yet approved or cleared by the [place] FDA and has been authorized for detection and/or diagnosis of SARS-CoV-2 by FDA under an Emergency Use Authorization (EUA). This EUA will remain in effect (meaning this test can be used) for the duration of  the COVID-19 declaration under Section 564(b)(1) of the Act, 21 U.S.C. section 360bbb-3(b)(1), unless the authorization is terminated or revoked.  Performed at [facility], [place] [address] [place], [place] [address]   Aerobic/Anaerobic Culture w Gram Stain (surgical/deep wound)     Status: None (Preliminary result)   Collection Time: 10/18/20 12:07 PM   Specimen: PATH Respiratory biopsy; Tissue  Result Value Ref Range Status   Specimen Description FLUID  Final   Special Requests RESPI BX STATION 7 SPEC B  Final   Gram Stain NO WBC SEEN NO ORGANISMS SEEN   Final   Culture   Final    NO GROWTH 2 DAYS NO ANAEROBES ISOLATED; CULTURE IN PROGRESS FOR 5 DAYS Performed at [facility], [place] [address], [place], [place] [address]    Report Status PENDING  Incomplete  Acid Fast Smear (AFB)     Status: None   Collection Time: 10/18/20 12:07 PM   Specimen: PATH Respiratory biopsy; Tissue  Result Value Ref Range Status   AFB Specimen Processing Concentration  Final   Acid Fast Smear Negative  Final    Comment: (NOTE) Performed At: [facility] [place], [place] [id number] Rush Farmer MD [id number]    Source (AFB) RESPIRATORY BIOSPY  Final    Comment: Performed at [facility], [place] [address] [place], [place] [address]  Culture, BAL-quantitative w Gram Stain     Status: None   Collection Time: 10/18/20 12:11 PM   Specimen: PATH Respiratory biopsy; Tissue  Result Value Ref Range Status   Specimen Description TISSUE  Final   Special Requests RESPI BX STATION 7 SPEC C  Final   Gram Stain   Final    ABUNDANT WBC PRESENT,BOTH PMN AND MONONUCLEAR NO ORGANISMS SEEN    Culture   Final    NO GROWTH 2 DAYS Performed at [facility], [address] [place], [place] [address]    Report Status 10/20/2020 FINAL  Final  Acid Fast Smear (AFB)     Status: None   Collection Time: 10/18/20 12:11 PM   Specimen: PATH Respiratory biopsy; Tissue   Result Value Ref Range Status   AFB Specimen Processing Concentration  Final   Acid Fast Smear Negative  Final    Comment: (NOTE) Performed At: [facility] [place], [place] [id number] Rush Farmer MD [id number]    Source (AFB) STATION 7  Final    Comment: Performed at [facility], [place] [address], [place], [place] [address]  Anaerobic culture w Gram Stain     Status: None (Preliminary result)   Collection Time: 10/18/20 12:11 PM   Specimen: Tissue  Result Value Ref Range Status   Specimen Description TISSUE  Final   Special Requests RESP BIOSPY STATION 7 SPEC C  Final   Gram Stain   Final    RARE WBC PRESENT, PREDOMINANTLY MONONUCLEAR NO ORGANISMS SEEN Performed at [facility], [place] [address] [place], [place] [address]    Culture   Final    NO ANAEROBES ISOLATED; CULTURE IN PROGRESS FOR 5 DAYS   Report Status PENDING  Incomplete       Studies: No results found.  Scheduled Meds:  aspirin EC  81 mg Oral Daily   dapagliflozin propanediol  10 mg Oral Daily   digoxin  0.125 mg Oral Daily   docusate sodium  100 mg Oral BID   enoxaparin (LOVENOX) injection  40 mg Subcutaneous Q24H   feeding supplement  237 mL Oral BID BM   isosorbide-hydrALAZINE  1 tablet Oral TID   loratadine  10 mg Oral Daily   mexiletine  200 mg Oral BID   montelukast  10 mg Oral Daily   multivitamin with minerals  1 tablet Oral Daily   pantoprazole  40 mg Oral Daily   predniSONE  40 mg Oral Q breakfast   sodium chloride flush  3 mL Intravenous Q12H   sodium chloride flush  3 mL Intravenous Q12H   sodium chloride flush  3 mL Intravenous Q12H   sulfamethoxazole-trimethoprim  1 tablet Oral Daily   tamsulosin  0.8 mg Oral QODAY    Continuous Infusions:  sodium chloride     sodium chloride       LOS: 8 days     Cristal Deer, MD [facility]  To reach me or the doctor on call, go to: [email or web address] Password [email or web address]  10/20/2020, 6:36 PM

## 2020-10-20 NOTE — Progress Notes (Signed)
Pt continues to have frequent PVC's and frequent non sustained V-tach.  CCMD notified me of one 7-8 beat run of V-tach.  Additionally, after returning from bathroom and sitting down in chair Pt felt dizzy for a minute.  Dizziness subsided with no other symptoms.  BP remains soft 93/72 with stable MAP (78).  Cardiology NP notified of aforementioned.  Order to hold 1400 dose of isosorbide-hydralazine.

## 2020-10-20 NOTE — Progress Notes (Signed)
Cardiology Progress Note  Patient ID: Nathan [name] MRN: [id number] DOB: November 12, 1965 Date of Encounter: 10/20/2020  Primary Cardiologist: Chilton Si, MD  Subjective   Chief Complaint: Dizziness  HPI: Blood pressure improved today.  We will attempt to get him on BiDil.  Potassium much improved.  Still having tremendous ectopy.  ROS:  All other ROS reviewed and negative. Pertinent positives noted in the HPI.     Inpatient Medications  Scheduled Meds:  aspirin EC  81 mg Oral Daily   dapagliflozin propanediol  10 mg Oral Daily   digoxin  0.125 mg Oral Daily   docusate sodium  100 mg Oral BID   enoxaparin (LOVENOX) injection  40 mg Subcutaneous Q24H   feeding supplement  237 mL Oral BID BM   isosorbide-hydrALAZINE  1 tablet Oral TID   loratadine  10 mg Oral Daily   mexiletine  200 mg Oral BID   montelukast  10 mg Oral Daily   multivitamin with minerals  1 tablet Oral Daily   pantoprazole  40 mg Oral Daily   predniSONE  40 mg Oral Q breakfast   sodium chloride flush  3 mL Intravenous Q12H   sodium chloride flush  3 mL Intravenous Q12H   sodium chloride flush  3 mL Intravenous Q12H   sulfamethoxazole-trimethoprim  1 tablet Oral Daily   tamsulosin  0.8 mg Oral QODAY   Continuous Infusions:  sodium chloride     sodium chloride     PRN Meds: sodium chloride, sodium chloride, acetaminophen **OR** acetaminophen, albuterol, methocarbamol, morphine injection, nitroGLYCERIN, ondansetron **OR** ondansetron (ZOFRAN) IV, oxyCODONE-acetaminophen, polyethylene glycol, sodium chloride flush, sodium chloride flush   Vital Signs   Vitals:   10/19/20 2300 10/20/20 0553 10/20/20 0600 10/20/20 0906  BP: 104/72 98/84  102/69  Pulse: 94 99 (!) 108 89  Resp: (!) 24 14 (!) 22 20  Temp: 97.9 F (36.6 C) 97.6 F (36.4 C)  98.1 F (36.7 C)  TempSrc: Oral Oral  Oral  SpO2: 95% 99% 96% 100%  Weight:   97.1 kg   Height:        Intake/Output Summary (Last 24 hours) at 10/20/2020  0914 Last data filed at 10/19/2020 1100 Gross per 24 hour  Intake 120 ml  Output 150 ml  Net -30 ml   Last 3 Weights 10/20/2020 10/19/2020 10/18/2020  Weight (lbs) 214 lb 219 lb 12.8 oz 222 lb 10.6 oz  Weight (kg) 97.07 kg 99.7 kg 101 kg      Telemetry  Overnight telemetry shows sinus tachycardia with PVCs that are multifocal, which I personally reviewed.   Physical Exam   Vitals:   10/19/20 2300 10/20/20 0553 10/20/20 0600 10/20/20 0906  BP: 104/72 98/84  102/69  Pulse: 94 99 (!) 108 89  Resp: (!) 24 14 (!) 22 20  Temp: 97.9 F (36.6 C) 97.6 F (36.4 C)  98.1 F (36.7 C)  TempSrc: Oral Oral  Oral  SpO2: 95% 99% 96% 100%  Weight:   97.1 kg   Height:        Intake/Output Summary (Last 24 hours) at 10/20/2020 0914 Last data filed at 10/19/2020 1100 Gross per 24 hour  Intake 120 ml  Output 150 ml  Net -30 ml    Last 3 Weights 10/20/2020 10/19/2020 10/18/2020  Weight (lbs) 214 lb 219 lb 12.8 oz 222 lb 10.6 oz  Weight (kg) 97.07 kg 99.7 kg 101 kg    Body mass index is 30.71 kg/m.  General: Well nourished,  well developed, in no acute distress Head: Atraumatic, normal size  Eyes: PEERLA, EOMI  Neck: Supple, no JVD Endocrine: No thryomegaly Cardiac: Normal S1, S2; tachycardia, no murmur Lungs: Clear to auscultation bilaterally, no wheezing, rhonchi or rales  Abd: Soft, nontender, no hepatomegaly  Ext: No edema, pulses 2+ Musculoskeletal: No deformities, BUE and BLE strength normal and equal Skin: Warm and dry, no rashes   Neuro: Alert and oriented to person, place, time, and situation, CNII-XII grossly intact, no focal deficits  Psych: Normal mood and affect   Labs  High Sensitivity Troponin:   Recent Labs  Lab 10/12/20 1424 10/13/20 0237 10/13/20 0524 10/14/20 0549 10/14/20 0820  TROPONINIHS 164* 154* 193* 129* 120*     Cardiac EnzymesNo results for input(s): TROPONINI in the last 168 hours. No results for input(s): TROPIPOC in the last 168 hours.   Chemistry Recent Labs  Lab 10/19/20 0157 10/19/20 1912 10/20/20 0138  NA 134* 134* 135  K 5.7* 4.5 4.1  CL 102 103 103  CO2 24 25 24   GLUCOSE 123* 141* 133*  BUN 27* 25* 24*  CREATININE 1.72* 1.52* 1.48*  CALCIUM 9.2 9.0 9.1  GFRNONAA 47* 54* 56*  ANIONGAP 8 6 8     Hematology Recent Labs  Lab 10/14/20 0327 10/16/20 0343 10/16/20 1655 10/16/20 1703 10/16/20 1704 10/17/20 0231  WBC 5.9 5.7  --   --   --  7.4  RBC 4.07* 4.14*  --   --   --  4.24  HGB 11.1* 11.4*   < > 12.2* 12.2* 11.6*  HCT 34.9* 34.9*   < > 36.0* 36.0* 36.3*  MCV 85.7 84.3  --   --   --  85.6  MCH 27.3 27.5  --   --   --  27.4  MCHC 31.8 32.7  --   --   --  32.0  RDW 15.2 14.9  --   --   --  15.2  PLT 216 243  --   --   --  360   < > = values in this interval not displayed.   BNPNo results for input(s): BNP, PROBNP in the last 168 hours.  DDimer No results for input(s): DDIMER in the last 168 hours.   Radiology  No results found.  Cardiac Studies   CMR 10/17/2020 FINDINGS: 1. Severe left ventricular dilation, with LVEDD 70 mm, but LVEDVi 142 mL/m2.   Normal left ventricular thickness, with intraventricular septal thickness of 7 mm, posterior wall thickness of 7 mm, and septal to posterior ratio < 1.5.   Severe left ventricular systolic dysfunction (LVEF = 18%). There is severe basal and mid anterior, anteroseptal, inferior, and lateral walls hypokinesis.   Left ventricular parametric mapping notable for slight elevation in the native T1 signal and normal T2 signal.   There is late gadolinium enhancement in the left ventricular myocardium: Patchy basal septal LGE, transmural basal inferolateral and anterolateral LGE with associated thinning that extends into the apex. There is inferior epicardial LGE in the base. Though this could be consistent with multi-vessel disease, lack of subendocardial involvement and patchy nature would favor sarcoidosis.   2. Normal right ventricular size  with RVEDVI 58 mL/m2.   Normal right ventricular thickness.   Moderate right ventricular systolic dysfunction (RVEF =33%). There are no regional wall motion abnormalities or aneurysms.   3.  Normal left and right atrial size.   4. Normal size of the aortic root, ascending aorta and pulmonary artery.   5.  Qualitatively there is at least moderate mitral regurgitation.   6.  Normal pericardium.  No pericardial effusion.   7. There are bilateral effusions and regions within the right pleural effusion that are isointense with myocardium. This is best seen posterior to the left atrium. Correlates to area on recent CTPE with Hounsfield units of 258. This may be consistent with patient's bulky lymphadenopathy with some associated atelectasis. Ultimately, would recommended dedicated study if additional imaging need for non-cardiac pathology.   8.  Breath hold artifact noted.   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 Non-cardiac findings discussed with primary and pulmonology teams.   LHC 10/16/2020   Hemodynamic findings consistent with mild pulmonary hypertension.   Estimated moderate-severe MR with 3-4+ based on size of V wave on PCWP tracing.   Angiographically Normal Coronary Arteries   SUMMARY Angiographically normal coronary arteries Mild to moderate Pulmonary Hypertension secondary to Mitral Regurgitation, and elevated LVEDP from left-sided failure. PAP 46/19 mm with a mean of 33 mmHg.  P CWP 27 mmHg and LVEDP 30 mmHg.  In the setting of systolic pressures 90/20 mmHg Cardiac Output (Fick) 6.75-3.06.-Index SVR 10.96, PVR 0.9 (Wood units)  Patient Profile  Nathan [name] is a 55 y.o. male with out medical history who was admitted on 10/03/2020 for acute systolic heart failure.  Found to have diffuse lymphadenopathy concerning for sarcoidosis.  Cardiac MRI is consistent with cardiac sarcoid.  Assessment & Plan   #Acute  systolic heart failure, EF 18% #Cardiac sarcoidosis -Euvolemic on exam.  He has been adequately diuresed. -No evidence of CAD. -Not in shock but BPs are soft. 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  Significant ectopy with multifocal nature also consistent with this. -Digoxin 0.125 mg daily was added yesterday.  Hold beta-blocker.  We will attempt to get him on BiDil today.  He may not tolerate this. -Evaluated by advanced heart failure.  Will need a LifeVest due to frequent ectopy.  We will set this up tomorrow. -Hopefully we can get him on guideline directed medical therapy.  He is on an SGLT2 inhibitor.  #Nonsustained ventricular tachycardia -Likely secondary to sarcoid.  On prednisone. -He will need a LifeVest at discharge. -Mexiletine added yesterday by advanced heart failure.  #CKD -Kidney function improving with better blood pressure. -Potassium normal. -May be able to get him on ACE tomorrow.  I do not believe he will tolerate Arni.  For questions or updates, please contact [facility] Please consult [email or web address] for contact info under   Time Spent with Patient: I have spent a total of 35 minutes with patient reviewing hospital notes, telemetry, EKGs, labs and examining the patient as well as establishing an assessment and plan that was discussed with the patient.  > 50% of time was spent in direct patient care.    Signed, Lenna Gilford. Flora Lipps, MD, [facility]  [facility]  10/20/2020 9:14 AM

## 2020-10-20 NOTE — Progress Notes (Signed)
Mobility Specialist Progress Note    10/20/20 1034  Mobility  Activity Ambulated in hall  Level of Assistance Independent  Assistive Device None  Distance Ambulated (ft) 900 ft  Mobility Ambulated independently in hallway  Mobility Response Tolerated well  Mobility performed by Mobility specialist  Bed Position Chair  $Mobility charge 1 Mobility   Pre-Mobility: 102 HR During Mobility: 123 HR Post-Mobility: 114 HR, 93/72 BP  Pt received in chair and agreeable to ambulation. Asx throughout ambulation and returned to chair with call bell in reach.   [place] Mobility Specialist  Mobility Specialist Phone: [phone]

## 2020-10-21 ENCOUNTER — Other Ambulatory Visit (HOSPITAL_COMMUNITY): Payer: Self-pay

## 2020-10-21 DIAGNOSIS — D8685 Sarcoid myocarditis: Secondary | ICD-10-CM | POA: Diagnosis not present

## 2020-10-21 DIAGNOSIS — I5021 Acute systolic (congestive) heart failure: Secondary | ICD-10-CM | POA: Diagnosis not present

## 2020-10-21 DIAGNOSIS — I420 Dilated cardiomyopathy: Secondary | ICD-10-CM | POA: Diagnosis not present

## 2020-10-21 DIAGNOSIS — I4729 Other ventricular tachycardia: Secondary | ICD-10-CM

## 2020-10-21 DIAGNOSIS — I472 Ventricular tachycardia: Secondary | ICD-10-CM

## 2020-10-21 DIAGNOSIS — N179 Acute kidney failure, unspecified: Secondary | ICD-10-CM

## 2020-10-21 DIAGNOSIS — I5041 Acute combined systolic (congestive) and diastolic (congestive) heart failure: Secondary | ICD-10-CM | POA: Diagnosis not present

## 2020-10-21 DIAGNOSIS — R590 Localized enlarged lymph nodes: Secondary | ICD-10-CM | POA: Diagnosis not present

## 2020-10-21 LAB — BASIC METABOLIC PANEL
Anion gap: 9 (ref 5–15)
BUN: 27 mg/dL — ABNORMAL HIGH (ref 6–20)
CO2: 24 mmol/L (ref 22–32)
Calcium: 9 mg/dL (ref 8.9–10.3)
Chloride: 99 mmol/L (ref 98–111)
Creatinine, Ser: 1.56 mg/dL — ABNORMAL HIGH (ref 0.61–1.24)
GFR, Estimated: 53 mL/min — ABNORMAL LOW (ref >60)
Glucose, Bld: 105 mg/dL — ABNORMAL HIGH (ref 70–99)
Potassium: 4.6 mmol/L (ref 3.5–5.1)
Sodium: 132 mmol/L — ABNORMAL LOW (ref 135–145)

## 2020-10-21 LAB — MAGNESIUM: Magnesium: 2.4 mg/dL (ref 1.7–2.4)

## 2020-10-21 MED ORDER — CERTAVITE/ANTIOXIDANTS PO TABS
1.0000 | ORAL_TABLET | Freq: Every day | ORAL | 0 refills | Status: DC
  Filled 2020-10-21: qty 30, 30d supply, fill #0

## 2020-10-21 MED ORDER — PREDNISONE 20 MG PO TABS
40.0000 mg | ORAL_TABLET | Freq: Every day | ORAL | 0 refills | Status: DC
  Filled 2020-10-21: qty 50, 25d supply, fill #0

## 2020-10-21 MED ORDER — COVID-19MRNA BIVAL VACC PFIZER 30 MCG/0.3ML IM SUSP
0.3000 mL | Freq: Once | INTRAMUSCULAR | Status: AC
  Administered 2020-10-21: 0.3 mL via INTRAMUSCULAR
  Filled 2020-10-21: qty 0.3

## 2020-10-21 MED ORDER — SULFAMETHOXAZOLE-TRIMETHOPRIM 800-160 MG PO TABS
1.0000 | ORAL_TABLET | ORAL | 0 refills | Status: DC
  Filled 2020-10-21: qty 12, 28d supply, fill #0

## 2020-10-21 MED ORDER — TAMSULOSIN HCL 0.4 MG PO CAPS
0.8000 mg | ORAL_CAPSULE | Freq: Every day | ORAL | 0 refills | Status: DC
  Filled 2020-10-21: qty 60, 30d supply, fill #0

## 2020-10-21 MED ORDER — METHOTREXATE 2.5 MG PO TABS
10.0000 mg | ORAL_TABLET | ORAL | 11 refills | Status: DC
  Filled 2020-10-21: qty 16, 28d supply, fill #0

## 2020-10-21 MED ORDER — DIGOXIN 125 MCG PO TABS
0.1250 mg | ORAL_TABLET | Freq: Every day | ORAL | 0 refills | Status: DC
  Filled 2020-10-21: qty 30, 30d supply, fill #0

## 2020-10-21 MED ORDER — ISOSORBIDE MONONITRATE ER 30 MG PO TB24
15.0000 mg | ORAL_TABLET | Freq: Every day | ORAL | 11 refills | Status: DC
  Filled 2020-10-21: qty 15, 30d supply, fill #0

## 2020-10-21 MED ORDER — ISOSORB DINITRATE-HYDRALAZINE 20-37.5 MG PO TABS
0.5000 | ORAL_TABLET | Freq: Three times a day (TID) | ORAL | 0 refills | Status: DC
  Filled 2020-10-21: qty 45, 30d supply, fill #0

## 2020-10-21 MED ORDER — MEXILETINE HCL 200 MG PO CAPS
200.0000 mg | ORAL_CAPSULE | Freq: Two times a day (BID) | ORAL | 0 refills | Status: DC
  Filled 2020-10-21: qty 60, 30d supply, fill #0

## 2020-10-21 MED ORDER — FOLIC ACID 1 MG PO TABS
1.0000 mg | ORAL_TABLET | Freq: Every day | ORAL | 11 refills | Status: AC
  Filled 2020-10-21 – 2021-07-29 (×3): qty 30, 30d supply, fill #0
  Filled 2021-08-23: qty 30, 30d supply, fill #1
  Filled 2021-10-12: qty 30, 30d supply, fill #2

## 2020-10-21 MED ORDER — TAMSULOSIN HCL 0.4 MG PO CAPS
0.8000 mg | ORAL_CAPSULE | Freq: Every day | ORAL | Status: DC
  Administered 2020-10-22: 0.8 mg via ORAL
  Filled 2020-10-21: qty 2

## 2020-10-21 MED ORDER — ENSURE ENLIVE PO LIQD
237.0000 mL | Freq: Two times a day (BID) | ORAL | 12 refills | Status: DC
  Filled 2020-10-21: qty 237, 1d supply, fill #0

## 2020-10-21 MED ORDER — POLYETHYLENE GLYCOL 3350 17 G PO PACK
17.0000 g | PACK | Freq: Every day | ORAL | Status: DC
  Administered 2020-10-21 – 2020-10-22 (×2): 17 g via ORAL
  Filled 2020-10-21 (×2): qty 1

## 2020-10-21 MED ORDER — ASPIRIN 81 MG PO TBEC
81.0000 mg | DELAYED_RELEASE_TABLET | Freq: Every day | ORAL | 11 refills | Status: DC
  Filled 2020-10-21: qty 30, 30d supply, fill #0

## 2020-10-21 MED ORDER — DAPAGLIFLOZIN PROPANEDIOL 10 MG PO TABS
10.0000 mg | ORAL_TABLET | Freq: Every day | ORAL | 0 refills | Status: DC
  Filled 2020-10-21: qty 30, 30d supply, fill #0

## 2020-10-21 MED ORDER — CALCIUM CARBONATE-VITAMIN D3 600-400 MG-UNIT PO TABS
1.0000 | ORAL_TABLET | Freq: Two times a day (BID) | ORAL | 3 refills | Status: AC
  Filled 2020-10-21: qty 180, 90d supply, fill #0

## 2020-10-21 MED ORDER — ISOSORB DINITRATE-HYDRALAZINE 20-37.5 MG PO TABS
0.5000 | ORAL_TABLET | Freq: Three times a day (TID) | ORAL | Status: DC
  Administered 2020-10-21 – 2020-10-22 (×4): 0.5 via ORAL
  Filled 2020-10-21 (×4): qty 1

## 2020-10-21 MED ORDER — HYDRALAZINE HCL 25 MG PO TABS
12.5000 mg | ORAL_TABLET | Freq: Three times a day (TID) | ORAL | 11 refills | Status: DC
  Filled 2020-10-21: qty 45, 30d supply, fill #0

## 2020-10-21 MED ORDER — SULFAMETHOXAZOLE-TRIMETHOPRIM 800-160 MG PO TABS: 1.0000 | ORAL_TABLET | ORAL | Status: DC

## 2020-10-21 NOTE — TOC Initial Note (Addendum)
Transition of Care [facility]) - Initial/Assessment Note    Patient Details  Name: Nathan [name] MRN: [id number] Date of Birth: May 22, 1965  Transition of Care [facility]) CM/SW Contact:    Nathan Cousin, RN Phone Number: [phone] 10/21/2020, 4:30 PM  Clinical Narrative:                  HF TOC CM spoke to pt and states he has insurance coverage. Discussed Life Vest with patient. Explained TOC CM will send referral and they will submit for auth. Gave permission to speak to wife, Nathan [name]. She will get information from his wallet. Sent insurance information to [facility] pharmacy. His copay for all meds is $63.06. Ordered received for Life Vest. Faxed orders, demo, progress notes and Echo report to [facility], [place]. Will work on getting approved through Community education officer.     Expected Discharge Plan: Home/Self Care Barriers to Discharge: No Barriers Identified   Patient Goals and CMS Choice     Choice offered to / list presented to : Patient  Expected Discharge Plan and Services Expected Discharge Plan: Home/Self Care In-house Referral: Clinical Social Work Discharge Planning Services: CM Consult Post Acute Care Choice: Durable Medical Equipment Living arrangements for the past 2 months: Single Family Home Expected Discharge Date: 10/21/20               DME Arranged: Life vest DME Agency: [employer] Date DME Agency Contacted: 10/21/20 Time DME Agency Contacted: [phone] Representative spoke with at DME Agency: Nathan [name]            Prior Living Arrangements/Services Living arrangements for the past 2 months: Single Family Home Lives with:: Spouse Patient language and need for interpreter reviewed:: Yes Do you feel safe going back to the place where you live?: Yes      Need for Family Participation in Patient Care: No (Comment) Care giver support system in place?: No (comment)   Criminal Activity/Legal Involvement Pertinent to Current Situation/Hospitalization: No - Comment as  needed  Activities of Daily Living Home Assistive Devices/Equipment: None ADL Screening (condition at time of admission) Patient's cognitive ability adequate to safely complete daily activities?: Yes Is the patient deaf or have difficulty hearing?: No Does the patient have difficulty seeing, even when wearing glasses/contacts?: No Does the patient have difficulty concentrating, remembering, or making decisions?: No Patient able to express need for assistance with ADLs?: Yes Does the patient have difficulty dressing or bathing?: No Independently performs ADLs?: Yes (appropriate for developmental age) Does the patient have difficulty walking or climbing stairs?: No Weakness of Legs: None Weakness of Arms/Hands: None  Permission Sought/Granted Permission sought to share information with : Case Manager, Family Supports, PCP Permission granted to share information with : Yes, Verbal Permission Granted  Share Information with NAME: Nathan [name]  Permission granted to share info w AGENCY: DME  Permission granted to share info w Relationship: wife  Permission granted to share info w Contact Information: [phone]  Emotional Assessment Appearance:: Appears stated age Attitude/Demeanor/Rapport: Gracious, Engaged Affect (typically observed): Pleasant Orientation: : Oriented to Self, Oriented to Place, Oriented to  Time, Oriented to Situation   Psych Involvement: No (comment)  Admission diagnosis:  Dyspnea [R06.00] Patient Active Problem List   Diagnosis Date Noted   Cardiac sarcoidosis 10/21/2020   NSVT (nonsustained ventricular tachycardia) (HCC) 10/21/2020   AKI (acute kidney injury) (HCC) 10/21/2020   Pleural effusion    Acute combined systolic and diastolic heart failure (HCC)    Sarcoidosis  Severe mitral regurgitation    Mediastinal lymphadenopathy 10/12/2020   Dyspnea 10/12/2020   PCP:  Nathan Davenport, MD Pharmacy:   [facility] [address] [place]),  - [address] [place] [address] Phone: [phone] Fax: [phone]  [facility] [address] [place] [address] Phone: [phone] Fax: [phone]     Social Determinants of Health (SDOH) Interventions Food Insecurity Interventions: Intervention Not Indicated, Assist with SNAP Application, Other (Comment) (Pt. reports anything would be helpful and he has never applied for food stamps before) Financial Strain Interventions: Other (Comment) [facility] application for disability) Housing Interventions: Intervention Not Indicated Transportation Interventions: [employer], Other (Comment) (Pt. reports he won't be able to drive for a few months)  Readmission Risk Interventions No flowsheet data found.

## 2020-10-21 NOTE — Progress Notes (Addendum)
Advanced Heart Failure Rounding Note  PCP-Cardiologist: Chilton Si, MD    Patient Profile   55 y/o male w/ cardiac sarcoidosis, admitted w/ a/c CHF.   Echo: EF 25% dilated. RV moderately HK. Mod-severe MR Personally reviewed   Cath 10/16/20: No CAD PA 46/19 (33) PCWP 27 Fick 6.7/3.1   cMRI: EF 18% severely dilated LVIDd 7.0 cm   Underwent bronch biopsy of mediastinal LAD on 10/18/20   Subjective:    On daily prednisone. Glucose ok.   Euvolemic, Wt down 10 lb since admit. Denies dyspnea.   Frequent ectopy on tele/PVCs. On Mexiletine 200 mg bid.  K 4.6  Mg 2.4   SBPs low 100s-110. Denies dizziness. No orthostatic symptoms.    Objective:   Weight Range: 98.1 kg Body mass index is 31.03 kg/m.   Vital Signs:   Temp:  [97.7 F (36.5 C)-98.5 F (36.9 C)] 97.7 F (36.5 C) (09/19 0741) Pulse Rate:  [86-112] 101 (09/19 0741) Resp:  [15-28] 16 (09/19 0741) BP: (96-115)/(55-84) 105/80 (09/19 0741) SpO2:  [97 %-100 %] 100 % (09/19 0741) Weight:  [98.1 kg] 98.1 kg (09/19 0434) Last BM Date: 10/18/20  Weight change: Filed Weights   10/19/20 0437 10/20/20 0600 10/21/20 0434  Weight: 99.7 kg 97.1 kg 98.1 kg    Intake/Output:   Intake/Output Summary (Last 24 hours) at 10/21/2020 0852 Last data filed at 10/21/2020 0300 Gross per 24 hour  Intake 240 ml  Output 680 ml  Net -440 ml      Physical Exam    General:  Well appearing. No resp difficulty HEENT: Normal Neck: Supple. JVP 7 cm . Carotids 2+ bilat; no bruits. No lymphadenopathy or thyromegaly appreciated. Cor: PMI nondisplaced. Irregular (PVCs). No rubs, gallops or murmurs. Lungs: Clear Abdomen: Soft, nontender, nondistended. No hepatosplenomegaly. No bruits or masses. Good bowel sounds. Extremities: No cyanosis, clubbing, rash, edema Neuro: Alert & orientedx3, cranial nerves grossly intact. moves all 4 extremities w/o difficulty. Affect pleasant   Telemetry   NSR w/ frequent PVCs/ brief NSVT    EKG    No new EKG to review   Labs    CBC No results for input(s): WBC, NEUTROABS, HGB, HCT, MCV, PLT in the last 72 hours. Basic Metabolic Panel Recent Labs    65/78/46 0138 10/21/20 0251  NA 135 132*  K 4.1 4.6  CL 103 99  CO2 24 24  GLUCOSE 133* 105*  BUN 24* 27*  CREATININE 1.48* 1.56*  CALCIUM 9.1 9.0  MG 2.3 2.4   Liver Function Tests No results for input(s): AST, ALT, ALKPHOS, BILITOT, PROT, ALBUMIN in the last 72 hours. No results for input(s): LIPASE, AMYLASE in the last 72 hours. Cardiac Enzymes No results for input(s): CKTOTAL, CKMB, CKMBINDEX, TROPONINI in the last 72 hours.  BNP: BNP (last 3 results) Recent Labs    10/12/20 1253  BNP 558.3*    ProBNP (last 3 results) No results for input(s): PROBNP in the last 8760 hours.   D-Dimer No results for input(s): DDIMER in the last 72 hours. Hemoglobin A1C No results for input(s): HGBA1C in the last 72 hours. Fasting Lipid Panel No results for input(s): CHOL, HDL, LDLCALC, TRIG, CHOLHDL, LDLDIRECT in the last 72 hours. Thyroid Function Tests No results for input(s): TSH, T4TOTAL, T3FREE, THYROIDAB in the last 72 hours.  Invalid input(s): FREET3  Other results:   Imaging    No results found.   Medications:     Scheduled Medications:  aspirin EC  81  mg Oral Daily   dapagliflozin propanediol  10 mg Oral Daily   digoxin  0.125 mg Oral Daily   docusate sodium  100 mg Oral BID   enoxaparin (LOVENOX) injection  40 mg Subcutaneous Q24H   feeding supplement  237 mL Oral BID BM   isosorbide-hydrALAZINE  1 tablet Oral TID   loratadine  10 mg Oral Daily   mexiletine  200 mg Oral BID   montelukast  10 mg Oral Daily   multivitamin with minerals  1 tablet Oral Daily   pantoprazole  40 mg Oral Daily   predniSONE  40 mg Oral Q breakfast   sodium chloride flush  3 mL Intravenous Q12H   sodium chloride flush  3 mL Intravenous Q12H   sodium chloride flush  3 mL Intravenous Q12H    sulfamethoxazole-trimethoprim  1 tablet Oral Daily   tamsulosin  0.8 mg Oral QODAY    Infusions:  sodium chloride     sodium chloride      PRN Medications: sodium chloride, sodium chloride, acetaminophen **OR** acetaminophen, albuterol, methocarbamol, morphine injection, nitroGLYCERIN, ondansetron **OR** ondansetron (ZOFRAN) IV, oxyCODONE-acetaminophen, polyethylene glycol, sodium chloride flush, sodium chloride flush   Assessment/Plan   1. 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 Volume status ok. Euvolemic.  - continue digoxin 0.125 - no ACE/ARNI, spiro due to low BP and hyperkalemia - b-blocker stopped due to low BP - on Bidil 1 tablet tid. Monitor BP closely  - on Farxiga 10 mg daily  - Continue prednisone. Add MTX - on Bactrim for PJP prophylaxis   2. NSVT  - likely due to sarcoid - continue prednisone.  - continue Mexiletine 200 bid  - will need LifeVest on d/c (order placed)    3. AKI - due to cardiorenal/ATN - baseline SCr 1.4 -> 1.7 - SCr 1.6 today    4. Pulmonary and cardiac sarcoid - results of biopsy pending  - treatment as above    5. 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 Burden ~ 20% - hopefully will improve with prednisone.  - c/w mexiletine - keep K> 4.0 mg > 2.0 (stable today)   6. Hyperkalemia - resolved after lokelma - K 4.7 today    Length of Stay: [address] PA-C  10/21/2020, 8:52 AM  Advanced Heart Failure Team Pager [phone] (M-F; 7a - 5p)  Please contact [facility] for night-coverage after hours (5p -7a ) and weekends on [email or web address]  Patient seen and examined with the above-signed Advanced Practice Provider and/or Housestaff. I personally reviewed laboratory data, imaging studies and relevant notes. I independently examined the patient and formulated the important  aspects of the plan. I have edited the note to reflect any of my changes or salient points. I have personally discussed the plan with the patient and/or family.  Feels much better. BP remains low however. PVCs seems less frequent on mexilitene. Walkied the halls  General:  Well appearing. No resp difficulty HEENT: normal Neck: supple. no JVD. Carotids 2+ bilat; no bruits. No lymphadenopathy or thryomegaly appreciated. Cor: PMI nondisplaced. Regular tachy  No rubs, gallops or murmurs. Lungs: clear Abdomen: soft, nontender, nondistended. No hepatosplenomegaly. No bruits or masses. Good bowel sounds. Extremities: no cyanosis, clubbing, rash, edema Neuro: alert & orientedx3, cranial nerves grossly intact. moves all 4 extremities w/o difficulty. Affect pleasant  I think he is stable  to go home today after LifeVest placed. Suspect EF down out of proportion to sarcoid involvement due to PVCs.   Home meds  Bidil 1/2 tab tid Mexilitene 200 bid Prednisone 40mg  daily Protonix 40 mg daily Methotrexate 10mg  every Wednesday Folic acid 1mg  daily Farxiga 10 mg daily  Digoxin 0.125 mg daily  Bactrim 1 DS tabl qMWF  Meds and future plans discussed at length with him and his wife.   Will need Zio at office f/u to quantify PVCs. PET scan in 3 months to make sure sarcoid suppressed.   Total time spent 35 minutes. Over half that time spent discussing above.   , MD  2:25 PM

## 2020-10-21 NOTE — Progress Notes (Signed)
PROGRESS NOTE    Nathan [name]   EGB:[id number]  DOB: [birth year]  DOA: 10/12/2020 PCP: Hayden Rasmussen, MD   Brief Narrative:  Nathan [name] is a 55 year old male with 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 He presented to the ED on 9/10 with a complaint of shortness of breath x1 month progressively worsening, left-sided chest pain and epigastric pain radiating to the back.  In the ED, chest x-ray revealed bilateral patchy infiltrates. pulse ox was 87% on room air and the patient was tachycardic   BNP was 558 D-dimer was 1.6. CTA of the chest revealed: Multifocal bulky mediastinal and hilar lymphadenopathy, bilateral pleural effusions small on the left moderate on the right, patchy groundglass opacities with central predominance in both lungs disproportionately affecting the right upper lobe.  9/12 2D echo: EF 30 to 35%, global hypokinesis, moderately elevated pulmonary artery systolic pressure with normal RV function, severe mitral valve regurgitation  9/14 underwent right and left heart cath> normal coronary arteries, mild to moderate pulmonary hypertension secondary to mitral regurgitation and elevated LVEDP  9/15 cardiac MRI: Was suggestive of cardiac sarcoidosis > severe LV dilatation, LV systolic dysfunction, severe basal, mid anterior, anteroseptal, inferior and lateral wall hypokinesis, moderate RV dysfunction, bilateral effusions.  9/16 underwent bronchoscopy with biopsy of mediastinal adenopathy  Subjective: Has not had a BM in 3 days. Overall, doing better.     Assessment & Plan:   Principal Problem:  Acute combined systolic and diastolic heart failure  Cardiac and pulmonary sarcoidosis - Prednisone 40 mg daily started on 9/13- I have discussed starting Methotrexate today- he and his wife would like to f/u with his PCP and discuss initiation of  Methotrexate as outpt -Also receiving [place] & digoxin -Due to low blood pressures beta-blocker discontinued -Diuretics on hold after diuresing about 6 L -Biopsy performed on 9/16-results pending -Completed 5 days of ceftriaxone and azithromycin - cont Bactrim for PJP prophylaxis   Active Problems:  History of sarcoidosis induced chorioretinitis -He receiving 15 mg of methotrexate, 5 mg of folic acid along with prednisolone ophthalmic suspension until he lost his job and his insurance - he is asymptomatic at this time    AKI (acute kidney injury)  -Felt to be ATN/cardiorenal -Baseline creatinine 1.14- 1.4 -Creatinine peaked at 1.72 on 9/17 and has improved    NSVT (nonsustained ventricular tachycardia) & frequent PVCs and sinus tachycardia -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 - Will need a LifeVest when discharged- awaiting for this to be arranged - cont Mexiletine 200 mg BID - follow K and Mg - goal K > 4.0 and Mg > 2.0  Hyperkalemia - treated with Lokelma and improved  BPH - Continue Flomax 0.8 mg QHS  Constipation - Miralax ordered  Anemia, normocytic - Hgb stable at 11-12 range  Time spent in minutes: 35 DVT prophylaxis: enoxaparin (LOVENOX) injection 40 mg Start: 10/12/20 1730  Code Status: Full code Family Communication: with his wife Level of Care: Level of care: Telemetry Cardiac Disposition Plan:  Status is: Inpatient  Remains inpatient appropriate because:Inpatient level of care appropriate due to severity of illness  Dispo: The patient is from: Home              Anticipated d/c is to: Home              Patient currently is not medically stable to d/c.   Difficult to place  patient No      Consultants:  Cardiology PCCM Procedures:  Bronchoscopy with biopsy Right and left heart cath 2 D ECHO Cardiac MRI Antimicrobials:  Anti-infectives (From admission, onward)    Start     Dose/Rate Route Frequency Ordered Stop   10/19/20 1545   sulfamethoxazole-trimethoprim (BACTRIM DS) 800-160 MG per tablet 1 tablet        1 tablet Oral Daily 10/19/20 1448     10/14/20 1700  azithromycin (ZITHROMAX) tablet 500 mg        500 mg Oral Daily 10/14/20 1534 10/17/20 0950   10/13/20 1600  cefTRIAXone (ROCEPHIN) 2 g in sodium chloride 0.9 % 100 mL IVPB        2 g 200 mL/hr over 30 Minutes Intravenous Every 24 hours 10/12/20 1737 10/17/20 2359   10/12/20 1745  cefTRIAXone (ROCEPHIN) 1 g in sodium chloride 0.9 % 100 mL IVPB  Status:  Discontinued        1 g 200 mL/hr over 30 Minutes Intravenous  Once 10/12/20 1737 10/14/20 1532   10/12/20 1730  cefTRIAXone (ROCEPHIN) 1 g in sodium chloride 0.9 % 100 mL IVPB  Status:  Discontinued        1 g 200 mL/hr over 30 Minutes Intravenous Every 24 hours 10/12/20 1727 10/12/20 1735   10/12/20 1730  azithromycin (ZITHROMAX) 500 mg in sodium chloride 0.9 % 250 mL IVPB  Status:  Discontinued        500 mg 250 mL/hr over 60 Minutes Intravenous Every 24 hours 10/12/20 1727 10/14/20 1534   10/12/20 1615  cefTRIAXone (ROCEPHIN) 1 g in sodium chloride 0.9 % 100 mL IVPB        1 g 200 mL/hr over 30 Minutes Intravenous  Once 10/12/20 1608 10/12/20 1730   10/12/20 1615  azithromycin (ZITHROMAX) 500 mg in sodium chloride 0.9 % 250 mL IVPB  Status:  Discontinued        500 mg 250 mL/hr over 60 Minutes Intravenous  Once 10/12/20 1608 10/12/20 1736        Objective: Vitals:   10/21/20 0027 10/21/20 0030 10/21/20 0434 10/21/20 0441  BP: (!) 96/55   115/79  Pulse:  86    Resp: (!) _0 Temp: 97.9 F (36.6 C)   97.8 F (36.6 C)  TempSrc: Oral   Oral  SpO2: 98% 98%    Weight:   98.1 kg   Height:        Intake/Output Summary (Last 24 hours) at 10/21/2020 0733 Last data filed at 10/21/2020 0300 Gross per 24 hour  Intake 240 ml  Output 680 ml  Net -440 ml   Filed Weights   10/19/20 0437 10/20/20 0600 10/21/20 0434  Weight: 99.7 kg 97.1 kg 98.1 kg    Examination: General exam: Appears  comfortable  HEENT: PERRLA, oral mucosa moist, no sclera icterus or thrush Respiratory system: Clear to auscultation. Respiratory effort normal. Cardiovascular system: S1 & S2 heard, RRR.   Gastrointestinal system: Abdomen soft, non-tender, mildly distended. Normal bowel sounds. Central nervous system: Alert and oriented. No focal neurological deficits. Extremities: No cyanosis, clubbing or edema Skin: No rashes or ulcers Psychiatry:  Mood & affect appropriate.     Data Reviewed: I have personally reviewed following labs and imaging studies  CBC: Recent Labs  Lab 10/16/20 0343 10/16/20 1655 10/16/20 1703 10/16/20 1704 10/17/20 0231  WBC 5.7  --   --   --  7.4  NEUTROABS  --   --   --   --  5.3  HGB 11.4* 12.2* 12.2* 12.2* 11.6*  HCT 34.9* 36.0* 36.0* 36.0* 36.3*  MCV 84.3  --   --   --  85.6  PLT 243  --   --   --  350   Basic Metabolic Panel: Recent Labs  Lab 10/16/20 0343 10/16/20 1655 10/18/20 0100 10/19/20 0157 10/19/20 1912 10/20/20 0138 10/21/20 0251  NA 134*   < > 135 134* 134* 135 132*  K 4.8   < > 4.5 5.7* 4.5 4.1 4.6  CL 103   < > 97* 102 103 103 99  CO2 23   < > _0 GLUCOSE 114*   < > 110* 123* 141* 133* 105*  BUN 20   < > 28* 27* 25* 24* 27*  CREATININE 1.44*   < > 1.62* 1.72* 1.52* 1.48* 1.56*  CALCIUM 9.2   < > 9.5 9.2 9.0 9.1 9.0  MG 2.1  --   --   --   --  2.3 2.4  PHOS 3.5  --   --   --   --   --   --    < > = values in this interval not displayed.   GFR: Estimated Creatinine Clearance: 64.3 mL/min (A) (by C-G formula based on SCr of 1.56 mg/dL (H)). Liver Function Tests: No results for input(s): AST, ALT, ALKPHOS, BILITOT, PROT, ALBUMIN in the last 168 hours. No results for input(s): LIPASE, AMYLASE in the last 168 hours. No results for input(s): AMMONIA in the last 168 hours. Coagulation Profile: No results for input(s): INR, PROTIME in the last 168 hours. Cardiac Enzymes: No results for input(s): CKTOTAL, CKMB, CKMBINDEX,  TROPONINI in the last 168 hours. BNP (last 3 results) No results for input(s): PROBNP in the last 8760 hours. HbA1C: No results for input(s): HGBA1C in the last 72 hours. CBG: No results for input(s): GLUCAP in the last 168 hours. Lipid Profile: No results for input(s): CHOL, HDL, LDLCALC, TRIG, CHOLHDL, LDLDIRECT in the last 72 hours. Thyroid Function Tests: No results for input(s): TSH, T4TOTAL, FREET4, T3FREE, THYROIDAB in the last 72 hours. Anemia Panel: No results for input(s): VITAMINB12, FOLATE, FERRITIN, TIBC, IRON, RETICCTPCT in the last 72 hours. Urine analysis: No results found for: COLORURINE, APPEARANCEUR, LABSPEC, PHURINE, GLUCOSEU, HGBUR, BILIRUBINUR, KETONESUR, PROTEINUR, UROBILINOGEN, NITRITE, LEUKOCYTESUR Sepsis Labs: _1 (procalcitonin:4,lacticidven:4) ) Recent Results (from the past 240 hour(s))  Resp Panel by RT-PCR (Flu A&B, Covid) Nasopharyngeal Swab     Status: None   Collection Time: 10/12/20  1:20 PM   Specimen: Nasopharyngeal Swab; Nasopharyngeal(NP) swabs in vial transport medium  Result Value Ref Range Status   SARS Coronavirus 2 by RT PCR NEGATIVE NEGATIVE Final    Comment: (NOTE) SARS-CoV-2 target nucleic acids are NOT DETECTED.  The SARS-CoV-2 RNA is generally detectable in upper respiratory specimens during the acute phase of infection. The lowest concentration of SARS-CoV-2 viral copies this assay can detect is 138 copies/mL. A negative result does not preclude SARS-Cov-2 infection and should not be used as the sole basis for treatment or other patient management decisions. A negative result may occur with  improper specimen collection/handling, submission of specimen other than nasopharyngeal swab, presence of viral mutation(s) within the areas targeted by this assay, and inadequate number of viral copies(<138 copies/mL). A negative result must be combined with clinical observations, patient history, and epidemiological information. The  expected result is Negative.  Fact Sheet for Patients:  EntrepreneurPulse.com.au  Fact Sheet for Healthcare Providers:  IncredibleEmployment.be  This test is no t yet approved or cleared by the [place] and  has been authorized for detection and/or diagnosis of SARS-CoV-2 by FDA under an Emergency Use Authorization (EUA). This EUA will remain  in effect (meaning this test can be used) for the duration of the COVID-19 declaration under Section 564(b)(1) of the Act, 21 U.S.C.section 360bbb-3(b)(1), unless the authorization is terminated  or revoked sooner.       Influenza A by PCR NEGATIVE NEGATIVE Final   Influenza B by PCR NEGATIVE NEGATIVE Final    Comment: (NOTE) The Xpert Xpress SARS-CoV-2/FLU/RSV plus assay is intended as an aid in the diagnosis of influenza from Nasopharyngeal swab specimens and should not be used as a sole basis for treatment. Nasal washings and aspirates are unacceptable for Xpert Xpress SARS-CoV-2/FLU/RSV testing.  Fact Sheet for Patients: EntrepreneurPulse.com.au  Fact Sheet for Healthcare Providers: IncredibleEmployment.be  This test is not yet approved or cleared by the [place] FDA and has been authorized for detection and/or diagnosis of SARS-CoV-2 by FDA under an Emergency Use Authorization (EUA). This EUA will remain in effect (meaning this test can be used) for the duration of the COVID-19 declaration under Section 564(b)(1) of the Act, 21 U.S.C. section 360bbb-3(b)(1), unless the authorization is terminated or revoked.  Performed at [facility], [place] [address] [place], [place] [address]   Aerobic/Anaerobic Culture w Gram Stain (surgical/deep wound)     Status: None (Preliminary result)   Collection Time: 10/18/20 12:07 PM   Specimen: PATH Respiratory biopsy; Tissue  Result Value Ref Range Status   Specimen Description FLUID  Final   Special  Requests RESPI BX STATION 7 SPEC B  Final   Gram Stain NO WBC SEEN NO ORGANISMS SEEN   Final   Culture   Final    NO GROWTH 2 DAYS NO ANAEROBES ISOLATED; CULTURE IN PROGRESS FOR 5 DAYS Performed at [facility], [address], [place], [place] [address]    Report Status PENDING  Incomplete  Acid Fast Smear (AFB)     Status: None   Collection Time: 10/18/20 12:07 PM   Specimen: PATH Respiratory biopsy; Tissue  Result Value Ref Range Status   AFB Specimen Processing Concentration  Final   Acid Fast Smear Negative  Final    Comment: (NOTE) Performed At: [facility] [place], [place] [id number] Rush Farmer MD OI:[id number]    Source (AFB) RESPIRATORY BIOSPY  Final    Comment: Performed at [facility], [place] [address] [place], [place] [address]  Culture, BAL-quantitative w Gram Stain     Status: None   Collection Time: 10/18/20 12:11 PM   Specimen: PATH Respiratory biopsy; Tissue  Result Value Ref Range Status   Specimen Description TISSUE  Final   Special Requests RESPI BX STATION 7 SPEC C  Final   Gram Stain   Final    ABUNDANT WBC PRESENT,BOTH PMN AND MONONUCLEAR NO ORGANISMS SEEN    Culture   Final    NO GROWTH 2 DAYS Performed at [facility], [address] [place], [place] [address]    Report Status 10/20/2020 FINAL  Final  Acid Fast Smear (AFB)     Status: None   Collection Time: 10/18/20 12:11 PM   Specimen: PATH Respiratory biopsy; Tissue  Result Value Ref Range Status   AFB Specimen Processing Concentration  Final   Acid Fast Smear Negative  Final    Comment: (  NOTE) Performed At: [facility] [place], [place] [id number] Rush Farmer MD ZR:[id number]    Source (AFB) STATION 7  Final    Comment: Performed at [facility], [place] [address], [place], [place] [address]  Anaerobic culture w Gram Stain     Status: None (Preliminary result)   Collection Time: 10/18/20 12:11 PM   Specimen:  Tissue  Result Value Ref Range Status   Specimen Description TISSUE  Final   Special Requests RESP BIOSPY STATION 7 SPEC C  Final   Gram Stain   Final    RARE WBC PRESENT, PREDOMINANTLY MONONUCLEAR NO ORGANISMS SEEN Performed at [facility], [place] [address] [place], [place] [address]    Culture   Final    NO ANAEROBES ISOLATED; CULTURE IN PROGRESS FOR 5 DAYS   Report Status PENDING  Incomplete         Radiology Studies: No results found.    Scheduled Meds:  aspirin EC  81 mg Oral Daily   dapagliflozin propanediol  10 mg Oral Daily   digoxin  0.125 mg Oral Daily   docusate sodium  100 mg Oral BID   enoxaparin (LOVENOX) injection  40 mg Subcutaneous Q24H   feeding supplement  237 mL Oral BID BM   isosorbide-hydrALAZINE  1 tablet Oral TID   loratadine  10 mg Oral Daily   mexiletine  200 mg Oral BID   montelukast  10 mg Oral Daily   multivitamin with minerals  1 tablet Oral Daily   pantoprazole  40 mg Oral Daily   predniSONE  40 mg Oral Q breakfast   sodium chloride flush  3 mL Intravenous Q12H   sodium chloride flush  3 mL Intravenous Q12H   sodium chloride flush  3 mL Intravenous Q12H   sulfamethoxazole-trimethoprim  1 tablet Oral Daily   tamsulosin  0.8 mg Oral QODAY   Continuous Infusions:  sodium chloride     sodium chloride       LOS: 9 days      Debbe Odea, MD [facility] Pager: [email or web address] 10/21/2020, 7:33 AM

## 2020-10-21 NOTE — TOC Initial Note (Addendum)
Transition of Care [facility]) - Initial/Assessment Note    Patient Details  Name: Nathan [name] MRN: [id number] Date of Birth: 11/19/1965  Transition of [facility]) CM/SW Contact:    Jenille Laszlo, LCSWA Phone Number: 10/21/2020, 1:21 PM  Clinical Narrative:                 HF CSW spoke with the patient at bedside and completed a very brief SDOH with the patient who reported that he won't be able to drive for awhile especially with having a Lifevest. [name] reported that his mom and wife will be able to help with transportation if needed. CSW discussed with [name] about [employer]. 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 CSW obtained the patient's signature for the disability application with the [facility] and for [facility] Transportation just in case. [name] reported that he has never received Food Stamps but is open to any available help. Patient reported they do have a PCP and they can get to the pharmacy to pick up their medications. [name] reported that he uses a Good Rx card for copays. CSW provided the patient with the social workers name and position and if anything changes to please reach out so that CSW can provide support.  CSW will continue to follow throughout discharge.   Barriers to Discharge: Continued Medical Work up   Patient Goals and CMS Choice        Expected Discharge Plan and Services   In-house Referral: Clinical Social Work     Living arrangements for the past 2 months: Single Family Home                                      Prior Living Arrangements/Services Living arrangements for the past 2 months: Single Family Home Lives with:: Parents, Self Patient language and need for interpreter reviewed:: Yes        Need for Family Participation in Patient Care: No (Comment) Care giver support system in place?: No (comment)    Criminal Activity/Legal Involvement Pertinent to Current Situation/Hospitalization: No - Comment as needed  Activities of Daily Living Home Assistive Devices/Equipment: None ADL Screening (condition at time of admission) Patient's cognitive ability adequate to safely complete daily activities?: Yes Is the patient deaf or have difficulty hearing?: No Does the patient have difficulty seeing, even when wearing glasses/contacts?: No Does the patient have difficulty concentrating, remembering, or making decisions?: No Patient able to express need for assistance with ADLs?: Yes Does the patient have difficulty dressing or bathing?: No Independently performs ADLs?: Yes (appropriate for developmental age) Does the patient have difficulty walking or climbing stairs?: No Weakness of Legs: None Weakness of Arms/Hands: None  Permission Sought/Granted                  Emotional Assessment Appearance:: Appears stated age Attitude/Demeanor/Rapport: Engaged Affect (typically observed): Pleasant Orientation: : Oriented to Self, Oriented to Place, Oriented to  Time, Oriented to Situation   Psych Involvement: No (comment)  Admission diagnosis:  Dyspnea [R06.00] Patient Active Problem List   Diagnosis Date Noted   Cardiac sarcoidosis 10/21/2020   NSVT (nonsustained ventricular tachycardia) (HCC) 10/21/2020   AKI (acute kidney injury) (HCC) 10/21/2020   Pleural effusion    Acute combined systolic and diastolic heart failure (  HCC)    Sarcoidosis    Severe mitral regurgitation    Mediastinal lymphadenopathy 10/12/2020   Dyspnea 10/12/2020   PCP:  Dois Davenport, MD Pharmacy:   [facility] [address] [place] [address] Phone: [phone] Fax: [phone]  [facility] [id number]), [place] - [address] [place] ([place]) [place] [address] Phone: [phone] Fax: [phone]     Social Determinants of Health  (SDOH) Interventions Food Insecurity Interventions: Intervention Not Indicated, Assist with SNAP Application, Other (Comment) (Pt. reports anything would be helpful and he has never applied for food stamps before) Financial Strain Interventions: Other (Comment) [facility] application for disability) Housing Interventions: Intervention Not Indicated Transportation Interventions: [employer], Other (Comment) (Pt. reports he won't be able to drive for a few months)  Readmission Risk Interventions No flowsheet data found.  Adiyah Lame, MSW, LCSWA [phone] Heart Failure Social Worker

## 2020-10-21 NOTE — Discharge Summary (Addendum)
Physician Discharge Summary  Nathan [name] KGM:[id number] DOB: October 28, 1965 DOA: 10/12/2020  PCP: Hayden Rasmussen, MD  Admit date: 10/12/2020 Discharge date: 10/22/2020  Admitted From: home  Disposition:  home   Recommendations for Outpatient Follow-up:  F/u on biopsy report  Home Health:  none  Discharge Condition:  stable   CODE STATUS:  Full code   Diet recommendation:  heart healthy Consultations: PCCM Heart failure team  Procedures/Studies: Bronchoscopy with biopsy Right and left heart cath 2 D ECHO Cardiac MRI   Discharge Diagnoses:  Principal Problem:   Acute combined systolic and diastolic heart failure ([place]) Active Problems:   AKI (acute kidney injury) ([place])   Sarcoidosis   Severe mitral regurgitation   Pleural effusion   Cardiac sarcoidosis   NSVT (nonsustained ventricular tachycardia) ([place])     Brief Summary: Nathan [name] is a 55 year old male with 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 He presented to the ED on 9/10 with a complaint of shortness of breath x1 month progressively worsening, left-sided chest pain and epigastric pain radiating to the back.   In the ED, chest x-ray revealed bilateral patchy infiltrates. pulse ox was 87% on room air and the patient was tachycardic   BNP was 558 D-dimer was 1.6. CTA of the chest revealed: Multifocal bulky mediastinal and hilar lymphadenopathy, bilateral pleural effusions small on the left moderate on the right, patchy groundglass opacities with central predominance in both lungs disproportionately affecting the right upper lobe.   9/12 2D echo: EF 30 to 35%, global hypokinesis, moderately elevated pulmonary artery systolic pressure with normal RV function, severe mitral valve regurgitation   9/14 underwent right and left heart cath> normal coronary arteries, mild to moderate pulmonary hypertension  secondary to mitral regurgitation and elevated LVEDP   9/15 cardiac MRI: Was suggestive of cardiac sarcoidosis > severe LV dilatation, LV systolic dysfunction, severe basal, mid anterior, anteroseptal, inferior and lateral wall hypokinesis, moderate RV dysfunction, bilateral effusions.   9/16 underwent bronchoscopy with biopsy of mediastinal adenopathy    Hospital Course:  Principal Problem:  Acute combined systolic and diastolic heart failure  Cardiac and pulmonary sarcoidosis - Prednisone 40 mg daily started on 9/13 - Methotrexate to be started on Wednesday - Also started on Bidil, Farxiga & digoxin -Diuretics on hold after diuresing about 6 L -Biopsy performed on 9/16-results pending -Completed 5 days of ceftriaxone and azithromycin - cont Bactrim for PJP prophylaxis    Active Problems:   History of sarcoidosis induced chorioretinitis -He receiving 15 mg of methotrexate, 5 mg of folic acid along with prednisolone ophthalmic suspension until he lost his job and his insurance - he is asymptomatic at this time     AKI (acute kidney injury)  -Felt to be ATN/cardiorenal -Baseline creatinine 1.14- 1.4 -Creatinine peaked at 1.72 on 9/17 and has improved     NSVT (nonsustained ventricular tachycardia) & frequent PVCs and sinus tachycardia -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 - Will need a LifeVest when discharged- awaiting for this to be arranged - cont Mexiletine 200 mg BID - follow K and Mg - goal K > 4.0 and Mg > 2.0   Hyperkalemia - treated with Lokelma and improved   BPH - Continue Flomax 0.8 mg QHS   Constipation - Miralax ordered   Anemia, normocytic - Hgb stable at 11-12 range     Discharge Exam: Vitals:   10/21/20 2325 10/22/20 0349  BP: 101/72 104/64  Pulse: 95 80  Resp: 18 18  Temp: 97.8 F (36.6 C) 97.8 F (36.6 C)  SpO2: 100% 100%   Vitals:   10/21/20 1530 10/21/20 2039 10/21/20 2325 10/22/20 0349  BP: 106/69 113/81 101/72 104/64  Pulse:  (!) 110 (!) 109 95 80  Resp: _0 Temp: 98 F (36.7 C) 97.8 F (36.6 C) 97.8 F (36.6 C) 97.8 F (36.6 C)  TempSrc: Oral Oral Oral Oral  SpO2: 100% 100% 100% 100%  Weight:      Height:        General: Pt is alert, awake, not in acute distress Cardiovascular: RRR, S1/S2 +, no rubs, no gallops Respiratory: CTA bilaterally, no wheezing, no rhonchi Abdominal: Soft, NT, ND, bowel sounds + Extremities: no edema, no cyanosis   Discharge Instructions  Discharge Instructions     Diet - low sodium heart healthy   Complete by: As directed    Increase activity slowly   Complete by: As directed       Allergies as of 10/22/2020   No Known Allergies      Medication List     STOP taking these medications    ibuprofen 200 MG tablet Commonly known as: ADVIL   naproxen 500 MG tablet Commonly known as: NAPROSYN   sildenafil 20 MG tablet Commonly known as: REVATIO       TAKE these medications    Aspirin Low Dose 81 MG EC tablet Generic drug: aspirin Take 1 tablet (81 mg total) by mouth daily. Swallow whole.   Calcium Carbonate-Vitamin D3 600-400 MG-UNIT Tabs Commonly known as: Calcium 600+D Take 1 tablet by mouth 2 (two) times daily.   CertaVite/Antioxidants Tabs Take 1 tablet by mouth daily.   cetirizine 10 MG tablet Commonly known as: ZYRTEC Take 10 mg by mouth daily.   digoxin 0.125 MG tablet Commonly known as: LANOXIN Take 1 tablet (0.125 mg total) by mouth daily.   Farxiga 10 MG Tabs tablet Generic drug: dapagliflozin propanediol Take 1 tablet (10 mg total) by mouth daily.   feeding supplement Liqd Take 237 mLs by mouth 2 (two) times daily between meals.   folic acid 1 MG tablet Commonly known as: FOLVITE Take 1 tablet (1 mg total) by mouth daily.   hydrALAZINE 25 MG tablet Commonly known as: APRESOLINE Take 1/2 tablet (12.5 mg total) by mouth 3 (three) times daily.   isosorbide mononitrate 30 MG 24 hr tablet Commonly known as:  IMDUR Take 1/2 tablet (15 mg total) by mouth daily.   methocarbamol 500 MG tablet Commonly known as: ROBAXIN Take 1 tablet (500 mg total) by mouth 2 (two) times daily as needed for muscle spasms.   methotrexate 2.5 MG tablet Commonly known as: RHEUMATREX Take 4 tablets (10 mg total) by mouth every Wednesday. Caution:Chemotherapy. Protect from light. Start taking on: October 23, 2020   mexiletine 200 MG capsule Commonly known as: MEXITIL Take 1 capsule (200 mg total) by mouth 2 (two) times daily.   montelukast 10 MG tablet Commonly known as: SINGULAIR Take 10 mg by mouth daily.   omeprazole 40 MG capsule Commonly known as: PRILOSEC Take 40 mg by mouth every morning.   predniSONE 20 MG tablet Commonly known as: DELTASONE Take 2 tablets (40 mg total) by mouth daily with breakfast.   sulfamethoxazole-trimethoprim 800-160 MG tablet Commonly known as: BACTRIM DS Take 1 tablet by mouth every Monday, Wednesday, and Friday. Start taking on: October 23, 2020   tamsulosin  0.4 MG Caps capsule Commonly known as: FLOMAX Take 2 capsules (0.8 mg total) by mouth daily. What changed: when to take this               Durable Medical Equipment  (From admission, onward)           Start     Ordered   10/21/20 0842  For home use only DME Vest life vest  Once       Comments: VT 150 bpm VF 200 BPM 150Jx5 Length of need: 3 months  Start Date: 10/21/2020   10/21/20 0843            No Known Allergies    DG Chest 2 View  Result Date: 10/14/2020 CLINICAL DATA:  Pleural effusion, mediastinal adenopathy. EXAM: CHEST - 2 VIEW COMPARISON:  10/12/2020 FINDINGS: Heart is normal size. Prominence of the mediastinal and hilar contours compatible with adenopathy as seen on prior CT. Patchy bilateral airspace disease again noted. Small bilateral effusions. IMPRESSION: Patchy bilateral airspace disease with small effusions, similar to prior CT. Mediastinal and bilateral hilar  adenopathy, similar to prior study. Electronically Signed   By: Rolm Baptise M.D.   On: 10/14/2020 10:09   DG Chest 2 View  Result Date: 10/12/2020 CLINICAL DATA:  Chest pain and shortness of breath. EXAM: CHEST - 2 VIEW COMPARISON:  Chest x-ray dated July 20, 2019. FINDINGS: The heart size and mediastinal contours are within normal limits. Patchy interstitial and hazy airspace opacities in both lungs. No pleural effusion or pneumothorax. No acute osseous abnormality. IMPRESSION: 1. Bilateral patchy interstitial and hazy airspace opacities, concerning for multifocal pneumonia, less likely pulmonary edema. Electronically Signed   By: Titus Dubin M.D.   On: 10/12/2020 14:54   CT Angio Chest PE W and/or Wo Contrast  Result Date: 10/12/2020 CLINICAL DATA:  Suspected pulmonary embolus. EXAM: CT ANGIOGRAPHY CHEST WITH CONTRAST TECHNIQUE: Multidetector CT imaging of the chest was performed using the standard protocol during bolus administration of intravenous contrast. Multiplanar CT image reconstructions and MIPs were obtained to evaluate the vascular anatomy. CONTRAST:  49m OMNIPAQUE IOHEXOL 350 MG/ML SOLN COMPARISON:  Jun 30, 2019 FINDINGS: Cardiovascular: Satisfactory opacification of the pulmonary arteries to the segmental level. No evidence of pulmonary embolism. Normal heart size. No pericardial effusion. Mediastinum/Nodes: Conglomerates of bulky abnormal lymph nodes are seen in right pretracheal, left prevascular, bilateral hilar and infra carinal regions. These measure up to 5 cm in diameter. Lungs/Pleura: Bilateral pleural effusions, small on the left and moderate on the right. Patchy ground-glass opacities with central predominance are seen in both lungs, disproportionately affecting the right upper lobe. Upper Abdomen: No acute abnormality. Musculoskeletal: No chest wall abnormality. No acute or significant osseous findings. Review of the MIP images confirms the above findings. IMPRESSION: 1. No  evidence of pulmonary embolism. 2. Multifocal bulky mediastinal and hilar lymphadenopathy. These may represent lymphoproliferative disorder, malignant or reactive lymphadenopathy. 3. Bilateral pleural effusions, small on the left and moderate on the right. 4. Patchy ground-glass opacities with central predominance in both lungs, disproportionately affecting the right upper lobe. This may represent infectious/inflammatory pneumonitis or asymmetric mixed pattern pulmonary edema. Electronically Signed   By: DFidela SalisburyM.D.   On: 10/12/2020 15:52   CARDIAC CATHETERIZATION  Result Date: 10/16/2020   Hemodynamic findings consistent with mild pulmonary hypertension.   Estimated moderate-severe MR with 3-4+ based on size of V wave on PCWP tracing.   Angiographically Normal Coronary Arteries SUMMARY Angiographically normal coronary arteries Mild to  moderate Pulmonary Hypertension secondary to Mitral Regurgitation, and elevated LVEDP from left-sided failure. PAP 46/19 mm with a mean of 33 mmHg.  P CWP 27 mmHg and LVEDP 30 mmHg.  In the setting of systolic pressures 62/37 mmHg Cardiac Output ([place]) 6.75-3.06.-Index SVR 10.96, PVR 0.9 (Wood units) RECOMMENDATIONS Transfer to cardiac telemetry unit for continued management.  Was given 40 mg IV Lasix in the Cath Lab, defer further diuresis to cardiology service. Glenetta Hew, MD  MR CARDIAC MORPHOLOGY W WO CONTRAST  Result Date: 10/17/2020 CLINICAL DATA:  Clinical question of sarcoidosis EXAM: CARDIAC MRI TECHNIQUE: The patient was scanned on a 1.5 Tesla GE magnet. A dedicated cardiac coil was used. Functional imaging was done using Fiesta sequences. 2,3, and 4 chamber views were done to assess for RWMA's. Modified Simpson's rule using a short axis stack was used to calculate an ejection fraction on a dedicated work Conservation officer, nature. The patient received 10 cc of Gadavist. After 10 minutes inversion recovery sequences were used to assess for  infiltration and scar tissue. CONTRAST:  10 cc  of Gadavist FINDINGS: 1. Severe left ventricular dilation, with LVEDD 70 mm, but LVEDVi 142 mL/m2. Normal left ventricular thickness, with intraventricular septal thickness of 7 mm, posterior wall thickness of 7 mm, and septal to posterior ratio < 1.5. Severe left ventricular systolic dysfunction (LVEF = 18%). There is severe basal and mid anterior, anteroseptal, inferior, and lateral walls hypokinesis. Left ventricular parametric mapping notable for slight elevation in the native T1 signal and normal T2 signal. There is late gadolinium enhancement in the left ventricular myocardium: Patchy basal septal LGE, transmural basal inferolateral and anterolateral LGE with associated thinning that extends into the apex. There is inferior epicardial LGE in the base. Though this could be consistent with multi-vessel disease, lack of subendocardial involvement and patchy nature would favor sarcoidosis. 2. Normal right ventricular size with RVEDVI 58 mL/m2. Normal right ventricular thickness. Moderate right ventricular systolic dysfunction (RVEF =33%). There are no regional wall motion abnormalities or aneurysms. 3.  Normal left and right atrial size. 4. Normal size of the aortic root, ascending aorta and pulmonary artery. 5.  Qualitatively there is at least moderate mitral regurgitation. 6.  Normal pericardium.  No pericardial effusion. 7. There are bilateral effusions and regions within the right pleural effusion that are isointense with myocardium. This is best seen posterior to the left atrium. Correlates to area on recent CTPE with Hounsfield units of 258. This may be consistent with patient's bulky lymphadenopathy with some associated atelectasis. Ultimately, would recommended dedicated study if additional imaging need for non-cardiac pathology. 8.  Breath hold artifact noted. 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 Non-cardiac  findings discussed with primary and pulmonology teams. Rudean Haskell MD Electronically Signed   By: Rudean Haskell M.D.   On: 10/17/2020 13:39   ECHOCARDIOGRAM COMPLETE  Result Date: 10/13/2020    ECHOCARDIOGRAM REPORT   Patient Name:   Nathan [name] Date of Exam: 10/13/2020 Medical Rec #:  [id number]       Height:       70.0 in Accession #:    [id number]      Weight:       223.1 lb Date of Birth:  April 24, 1965        BSA:          2.187 m Patient Age:    38 years        BP:  115/88 mmHg Patient Gender: M               HR:           116 bpm. Exam Location:  Inpatient Procedure: 2D Echo, Cardiac Doppler and Color Doppler Indications:    Dyspnea R06.00  History:        Patient has no prior history of Echocardiogram examinations.  Sonographer:    Bernadene Person RDCS Referring Phys: [id number] [place]  1. Left ventricular ejection fraction, by estimation, is 30 to 35%. The left ventricle has moderately decreased function. The left ventricle demonstrates global hypokinesis. The left ventricular internal cavity size was severely dilated. Left ventricular diastolic parameters are indeterminate.  2. Right ventricular systolic function is normal. The right ventricular size is normal. There is moderately elevated pulmonary artery systolic pressure.  3. Left atrial size was moderately dilated.  4. The mitral valve is normal in structure. Severe mitral valve regurgitation. No evidence of mitral stenosis.  5. The aortic valve is tricuspid. Aortic valve regurgitation is trivial. Mild aortic valve sclerosis is present, with no evidence of aortic valve stenosis.  6. The inferior vena cava is normal in size with greater than 50% respiratory variability, suggesting right atrial pressure of 3 mmHg. FINDINGS  Left Ventricle: Left ventricular ejection fraction, by estimation, is 30 to 35%. The left ventricle has moderately decreased function. The left ventricle demonstrates global hypokinesis.  The left ventricular internal cavity size was severely dilated. There is no left ventricular hypertrophy. Left ventricular diastolic parameters are indeterminate. Right Ventricle: The right ventricular size is normal. Right ventricular systolic function is normal. There is moderately elevated pulmonary artery systolic pressure. The tricuspid regurgitant velocity is 3.48 m/s, and with an assumed right atrial pressure of 3 mmHg, the estimated right ventricular systolic pressure is 16.6 mmHg. Left Atrium: Left atrial size was moderately dilated. Right Atrium: Right atrial size was normal in size. Pericardium: Trivial pericardial effusion is present. Mitral Valve: The mitral valve is normal in structure. Severe mitral valve regurgitation. No evidence of mitral valve stenosis. Tricuspid Valve: The tricuspid valve is normal in structure. Tricuspid valve regurgitation is mild . No evidence of tricuspid stenosis. Aortic Valve: The aortic valve is tricuspid. Aortic valve regurgitation is trivial. Mild aortic valve sclerosis is present, with no evidence of aortic valve stenosis. Pulmonic Valve: The pulmonic valve was normal in structure. Pulmonic valve regurgitation is mild. No evidence of pulmonic stenosis. Aorta: The aortic root is normal in size and structure. Venous: The inferior vena cava is normal in size with greater than 50% respiratory variability, suggesting right atrial pressure of 3 mmHg. IAS/Shunts: No atrial level shunt detected by color flow Doppler.  LEFT VENTRICLE PLAX 2D LVIDd:         6.70 cm LVIDs:         5.50 cm LV PW:         1.20 cm LV IVS:        0.70 cm LVOT diam:     2.10 cm LV SV:         43 LV SV Index:   20 LVOT Area:     3.46 cm  LV Volumes (MOD) LV vol d, MOD A2C: 163.0 ml LV vol d, MOD A4C: 206.0 ml LV vol s, MOD A2C: 122.0 ml LV vol s, MOD A4C: 130.0 ml LV SV MOD A2C:     41.0 ml LV SV MOD A4C:     206.0 ml LV SV MOD  BP:      57.8 ml RIGHT VENTRICLE TAPSE (M-mode): 1.6 cm LEFT ATRIUM              Index       RIGHT ATRIUM           Index LA diam:        4.80 cm 2.20 cm/m  RA Area:     13.10 cm LA Vol (A2C):   82.5 ml 37.73 ml/m RA Volume:   33.00 ml  15.09 ml/m LA Vol (A4C):   83.0 ml 37.96 ml/m LA Biplane Vol: 91.2 ml 41.71 ml/m  AORTIC VALVE LVOT Vmax:   99.45 cm/s LVOT Vmean:  65.500 cm/s LVOT VTI:    0.125 m  AORTA Ao Root diam: 2.90 cm Ao Asc diam:  3.20 cm MR Peak grad:    80.6 mmHg   TRICUSPID VALVE MR Mean grad:    52.0 mmHg   TR Peak grad:   48.4 mmHg MR Vmax:         449.00 cm/s TR Vmax:        348.00 cm/s MR Vmean:        336.0 cm/s MR PISA:         1.57 cm    SHUNTS MR PISA Eff ROA: 12 mm      Systemic VTI:  0.12 m MR PISA Radius:  0.50 cm     Systemic Diam: 2.10 cm Kirk Ruths MD Electronically signed by Kirk Ruths MD Signature Date/Time: 10/13/2020/1:23:17 PM    Final      The results of significant diagnostics from this hospitalization (including imaging, microbiology, ancillary and laboratory) are listed below for reference.     Microbiology: Recent Results (from the past 240 hour(s))  Resp Panel by RT-PCR (Flu A&B, Covid) Nasopharyngeal Swab     Status: None   Collection Time: 10/12/20  1:20 PM   Specimen: Nasopharyngeal Swab; Nasopharyngeal(NP) swabs in vial transport medium  Result Value Ref Range Status   SARS Coronavirus 2 by RT PCR NEGATIVE NEGATIVE Final    Comment: (NOTE) SARS-CoV-2 target nucleic acids are NOT DETECTED.  The SARS-CoV-2 RNA is generally detectable in upper respiratory specimens during the acute phase of infection. The lowest concentration of SARS-CoV-2 viral copies this assay can detect is 138 copies/mL. A negative result does not preclude SARS-Cov-2 infection and should not be used as the sole basis for treatment or other patient management decisions. A negative result may occur with  improper specimen collection/handling, submission of specimen other than nasopharyngeal swab, presence of viral mutation(s) within the areas  targeted by this assay, and inadequate number of viral copies(<138 copies/mL). A negative result must be combined with clinical observations, patient history, and epidemiological information. The expected result is Negative.  Fact Sheet for Patients:  EntrepreneurPulse.com.au  Fact Sheet for Healthcare Providers:  IncredibleEmployment.be  This test is no t yet approved or cleared by the [place] FDA and  has been authorized for detection and/or diagnosis of SARS-CoV-2 by FDA under an Emergency Use Authorization (EUA). This EUA will remain  in effect (meaning this test can be used) for the duration of the COVID-19 declaration under Section 564(b)(1) of the Act, 21 U.S.C.section 360bbb-3(b)(1), unless the authorization is terminated  or revoked sooner.       Influenza A by PCR NEGATIVE NEGATIVE Final   Influenza B by PCR NEGATIVE NEGATIVE Final    Comment: (NOTE) The Xpert Xpress SARS-CoV-2/FLU/RSV plus assay is intended as an aid  in the diagnosis of influenza from Nasopharyngeal swab specimens and should not be used as a sole basis for treatment. Nasal washings and aspirates are unacceptable for Xpert Xpress SARS-CoV-2/FLU/RSV testing.  Fact Sheet for Patients: EntrepreneurPulse.com.au  Fact Sheet for Healthcare Providers: IncredibleEmployment.be  This test is not yet approved or cleared by the [place] FDA and has been authorized for detection and/or diagnosis of SARS-CoV-2 by FDA under an Emergency Use Authorization (EUA). This EUA will remain in effect (meaning this test can be used) for the duration of the COVID-19 declaration under Section 564(b)(1) of the Act, 21 U.S.C. section 360bbb-3(b)(1), unless the authorization is terminated or revoked.  Performed at [facility], [place] [address] [place], [place] [address]   Aerobic/Anaerobic Culture w Gram Stain (surgical/deep wound)      Status: None (Preliminary result)   Collection Time: 10/18/20 12:07 PM   Specimen: PATH Respiratory biopsy; Tissue  Result Value Ref Range Status   Specimen Description FLUID  Final   Special Requests RESPI BX STATION 7 SPEC B  Final   Gram Stain NO WBC SEEN NO ORGANISMS SEEN   Final   Culture   Final    NO GROWTH 3 DAYS NO ANAEROBES ISOLATED; CULTURE IN PROGRESS FOR 5 DAYS Performed at [facility], [address] [place], [place] [address]    Report Status PENDING  Incomplete  Acid Fast Smear (AFB)     Status: None   Collection Time: 10/18/20 12:07 PM   Specimen: PATH Respiratory biopsy; Tissue  Result Value Ref Range Status   AFB Specimen Processing Concentration  Final   Acid Fast Smear Negative  Final    Comment: (NOTE) Performed At: [facility] [place], [place] [id number] Rush Farmer MD HF:[id number]    Source (AFB) RESPIRATORY BIOSPY  Final    Comment: Performed at [facility], [place] [address], [place], [place] [address]  Culture, BAL-quantitative w Gram Stain     Status: None   Collection Time: 10/18/20 12:11 PM   Specimen: PATH Respiratory biopsy; Tissue  Result Value Ref Range Status   Specimen Description TISSUE  Final   Special Requests RESPI BX STATION 7 SPEC C  Final   Gram Stain   Final    ABUNDANT WBC PRESENT,BOTH PMN AND MONONUCLEAR NO ORGANISMS SEEN    Culture   Final    NO GROWTH 2 DAYS Performed at [facility], [address] [place], [place] [address]    Report Status 10/20/2020 FINAL  Final  Acid Fast Smear (AFB)     Status: None   Collection Time: 10/18/20 12:11 PM   Specimen: PATH Respiratory biopsy; Tissue  Result Value Ref Range Status   AFB Specimen Processing Concentration  Final   Acid Fast Smear Negative  Final    Comment: (NOTE) Performed At: [facility] [place], [place] [id number] Rush Farmer MD SJ:[id number]    Source (AFB) STATION 7  Final    Comment:  Performed at [facility], [place] [address] [place], [place] [address]  Anaerobic culture w Gram Stain     Status: None (Preliminary result)   Collection Time: 10/18/20 12:11 PM   Specimen: Tissue  Result Value Ref Range Status   Specimen Description TISSUE  Final   Special Requests RESP BIOSPY STATION 7 SPEC C  Final   Gram Stain   Final    RARE WBC PRESENT, PREDOMINANTLY MONONUCLEAR NO ORGANISMS SEEN  Performed at [facility], [place] [address], [place], [place] [address]    Culture   Final    NO ANAEROBES ISOLATED; CULTURE IN PROGRESS FOR 5 DAYS   Report Status PENDING  Incomplete     Labs: BNP (last 3 results) Recent Labs    10/12/20 1253  BNP 224.8*   Basic Metabolic Panel: Recent Labs  Lab 10/16/20 0343 10/16/20 1655 10/19/20 0157 10/19/20 1912 10/20/20 0138 10/21/20 0251 10/22/20 0141  NA 134*   < > 134* 134* 135 132* 132*  K 4.8   < > 5.7* 4.5 4.1 4.6 4.6  CL 103   < > 102 103 103 99 101  CO2 23   < > _0 GLUCOSE 114*   < > 123* 141* 133* 105* 130*  BUN 20   < > 27* 25* 24* 27* 29*  CREATININE 1.44*   < > 1.72* 1.52* 1.48* 1.56* 1.60*  CALCIUM 9.2   < > 9.2 9.0 9.1 9.0 9.1  MG 2.1  --   --   --  2.3 2.4 2.4  PHOS 3.5  --   --   --   --   --   --    < > = values in this interval not displayed.   Liver Function Tests: No results for input(s): AST, ALT, ALKPHOS, BILITOT, PROT, ALBUMIN in the last 168 hours. No results for input(s): LIPASE, AMYLASE in the last 168 hours. No results for input(s): AMMONIA in the last 168 hours. CBC: Recent Labs  Lab 10/16/20 0343 10/16/20 1655 10/16/20 1703 10/16/20 1704 10/17/20 0231  WBC 5.7  --   --   --  7.4  NEUTROABS  --   --   --   --  5.3  HGB 11.4* 12.2* 12.2* 12.2* 11.6*  HCT 34.9* 36.0* 36.0* 36.0* 36.3*  MCV 84.3  --   --   --  85.6  PLT 243  --   --   --  360   Cardiac Enzymes: No results for input(s): CKTOTAL, CKMB, CKMBINDEX, TROPONINI in the last 168 hours. BNP: Invalid  input(s): POCBNP CBG: No results for input(s): GLUCAP in the last 168 hours. D-Dimer No results for input(s): DDIMER in the last 72 hours. Hgb A1c No results for input(s): HGBA1C in the last 72 hours. Lipid Profile No results for input(s): CHOL, HDL, LDLCALC, TRIG, CHOLHDL, LDLDIRECT in the last 72 hours. Thyroid function studies No results for input(s): TSH, T4TOTAL, T3FREE, THYROIDAB in the last 72 hours.  Invalid input(s): FREET3 Anemia work up No results for input(s): VITAMINB12, FOLATE, FERRITIN, TIBC, IRON, RETICCTPCT in the last 72 hours. Urinalysis No results found for: COLORURINE, APPEARANCEUR, [place], [place], GLUCOSEU, [place], [place], [place], PROTEINUR, UROBILINOGEN, NITRITE, LEUKOCYTESUR Sepsis Labs Invalid input(s): PROCALCITONIN,  WBC,  LACTICIDVEN Microbiology Recent Results (from the past 240 hour(s))  Resp Panel by RT-PCR (Flu A&B, Covid) Nasopharyngeal Swab     Status: None   Collection Time: 10/12/20  1:20 PM   Specimen: Nasopharyngeal Swab; Nasopharyngeal(NP) swabs in vial transport medium  Result Value Ref Range Status   SARS Coronavirus 2 by RT PCR NEGATIVE NEGATIVE Final    Comment: (NOTE) SARS-CoV-2 target nucleic acids are NOT DETECTED.  The SARS-CoV-2 RNA is generally detectable in upper respiratory specimens during the acute phase of infection. The lowest concentration of SARS-CoV-2 viral copies this assay can detect is 138 copies/mL. A negative result does not preclude SARS-Cov-2 infection and should not be  used as the sole basis for treatment or other patient management decisions. A negative result may occur with  improper specimen collection/handling, submission of specimen other than nasopharyngeal swab, presence of viral mutation(s) within the areas targeted by this assay, and inadequate number of viral copies(<138 copies/mL). A negative result must be combined with clinical observations, patient history, and epidemiological information.  The expected result is Negative.  Fact Sheet for Patients:  EntrepreneurPulse.com.au  Fact Sheet for Healthcare Providers:  IncredibleEmployment.be  This test is no t yet approved or cleared by the [place] FDA and  has been authorized for detection and/or diagnosis of SARS-CoV-2 by FDA under an Emergency Use Authorization (EUA). This EUA will remain  in effect (meaning this test can be used) for the duration of the COVID-19 declaration under Section 564(b)(1) of the Act, 21 U.S.C.section 360bbb-3(b)(1), unless the authorization is terminated  or revoked sooner.       Influenza A by PCR NEGATIVE NEGATIVE Final   Influenza B by PCR NEGATIVE NEGATIVE Final    Comment: (NOTE) The Xpert Xpress SARS-CoV-2/FLU/RSV plus assay is intended as an aid in the diagnosis of influenza from Nasopharyngeal swab specimens and should not be used as a sole basis for treatment. Nasal washings and aspirates are unacceptable for Xpert Xpress SARS-CoV-2/FLU/RSV testing.  Fact Sheet for Patients: EntrepreneurPulse.com.au  Fact Sheet for Healthcare Providers: IncredibleEmployment.be  This test is not yet approved or cleared by the [place] FDA and has been authorized for detection and/or diagnosis of SARS-CoV-2 by FDA under an Emergency Use Authorization (EUA). This EUA will remain in effect (meaning this test can be used) for the duration of the COVID-19 declaration under Section 564(b)(1) of the Act, 21 U.S.C. section 360bbb-3(b)(1), unless the authorization is terminated or revoked.  Performed at [facility], [place] [address], [place], [place] [address]   Aerobic/Anaerobic Culture w Gram Stain (surgical/deep wound)     Status: None (Preliminary result)   Collection Time: 10/18/20 12:07 PM   Specimen: PATH Respiratory biopsy; Tissue  Result Value Ref Range Status   Specimen Description FLUID  Final   Special  Requests RESPI BX STATION 7 SPEC B  Final   Gram Stain NO WBC SEEN NO ORGANISMS SEEN   Final   Culture   Final    NO GROWTH 3 DAYS NO ANAEROBES ISOLATED; CULTURE IN PROGRESS FOR 5 DAYS Performed at [facility], [address] [place], [place] [address]    Report Status PENDING  Incomplete  Acid Fast Smear (AFB)     Status: None   Collection Time: 10/18/20 12:07 PM   Specimen: PATH Respiratory biopsy; Tissue  Result Value Ref Range Status   AFB Specimen Processing Concentration  Final   Acid Fast Smear Negative  Final    Comment: (NOTE) Performed At: [facility] [place], [place] [id number] Rush Farmer MD [id number]    Source (AFB) RESPIRATORY BIOSPY  Final    Comment: Performed at [facility], [place] [address] [place], [place] [address]  Culture, BAL-quantitative w Gram Stain     Status: None   Collection Time: 10/18/20 12:11 PM   Specimen: PATH Respiratory biopsy; Tissue  Result Value Ref Range Status   Specimen Description TISSUE  Final   Special Requests RESPI BX STATION 7 SPEC C  Final   Gram Stain   Final    ABUNDANT WBC PRESENT,BOTH PMN AND MONONUCLEAR NO ORGANISMS SEEN    Culture  Final    NO GROWTH 2 DAYS Performed at [facility], [place] [address] [place], [place] [address]    Report Status 10/20/2020 FINAL  Final  Acid Fast Smear (AFB)     Status: None   Collection Time: 10/18/20 12:11 PM   Specimen: PATH Respiratory biopsy; Tissue  Result Value Ref Range Status   AFB Specimen Processing Concentration  Final   Acid Fast Smear Negative  Final    Comment: (NOTE) Performed At: [facility] [place], [place] [id number] Rush Farmer MD [id number]    Source (AFB) STATION 7  Final    Comment: Performed at [facility], [place] [address], [place], [place] [address]  Anaerobic culture w Gram Stain     Status: None (Preliminary result)   Collection Time: 10/18/20 12:11 PM   Specimen:  Tissue  Result Value Ref Range Status   Specimen Description TISSUE  Final   Special Requests RESP BIOSPY STATION 7 SPEC C  Final   Gram Stain   Final    RARE WBC PRESENT, PREDOMINANTLY MONONUCLEAR NO ORGANISMS SEEN Performed at [facility], [place] [address] [place], [place] [address]    Culture   Final    NO ANAEROBES ISOLATED; CULTURE IN PROGRESS FOR 5 DAYS   Report Status PENDING  Incomplete     Time coordinating discharge in minutes: 65  SIGNED:   Debbe Odea, MD  [facility] 10/22/2020, 6:56 AM

## 2020-10-21 NOTE — Progress Notes (Signed)
Cardiology Progress Note  Patient ID: Nathan [name] MRN: [id number] DOB: Jan 25, 1966 Date of Encounter: 10/21/2020  Primary Cardiologist: Chilton Si, MD  Subjective   Chief Complaint: Dizziness  HPI: Started on BiDil yesterday.  Reports some dizziness.  Euvolemic.  Still with tremendous PVCs on monitor.  ROS:  All other ROS reviewed and negative. Pertinent positives noted in the HPI.     Inpatient Medications  Scheduled Meds:  aspirin EC  81 mg Oral Daily   dapagliflozin propanediol  10 mg Oral Daily   digoxin  0.125 mg Oral Daily   docusate sodium  100 mg Oral BID   enoxaparin (LOVENOX) injection  40 mg Subcutaneous Q24H   feeding supplement  237 mL Oral BID BM   isosorbide-hydrALAZINE  1 tablet Oral TID   loratadine  10 mg Oral Daily   mexiletine  200 mg Oral BID   montelukast  10 mg Oral Daily   multivitamin with minerals  1 tablet Oral Daily   pantoprazole  40 mg Oral Daily   predniSONE  40 mg Oral Q breakfast   sodium chloride flush  3 mL Intravenous Q12H   sodium chloride flush  3 mL Intravenous Q12H   sodium chloride flush  3 mL Intravenous Q12H   sulfamethoxazole-trimethoprim  1 tablet Oral Daily   tamsulosin  0.8 mg Oral QODAY   Continuous Infusions:  sodium chloride     sodium chloride     PRN Meds: sodium chloride, sodium chloride, acetaminophen **OR** acetaminophen, albuterol, methocarbamol, morphine injection, nitroGLYCERIN, ondansetron **OR** ondansetron (ZOFRAN) IV, oxyCODONE-acetaminophen, polyethylene glycol, sodium chloride flush, sodium chloride flush   Vital Signs   Vitals:   10/21/20 0030 10/21/20 0434 10/21/20 0441 10/21/20 0741  BP:   115/79 105/80  Pulse: 86   (!) 101  Resp: 19  20 16   Temp:   97.8 F (36.6 C) 97.7 F (36.5 C)  TempSrc:   Oral Oral  SpO2: 98%   100%  Weight:  98.1 kg    Height:        Intake/Output Summary (Last 24 hours) at 10/21/2020 0907 Last data filed at 10/21/2020 0900 Gross per 24 hour  Intake 480 ml   Output 680 ml  Net -200 ml   Last 3 Weights 10/21/2020 10/20/2020 10/19/2020  Weight (lbs) 216 lb 4.3 oz 214 lb 219 lb 12.8 oz  Weight (kg) 98.1 kg 97.07 kg 99.7 kg      Telemetry  Overnight telemetry shows sinus tachycardia 110 bpm, very frequent PVCs which are multifocal, which I personally reviewed.   Physical Exam   Vitals:   10/21/20 0030 10/21/20 0434 10/21/20 0441 10/21/20 0741  BP:   115/79 105/80  Pulse: 86   (!) 101  Resp: 19  20 16   Temp:   97.8 F (36.6 C) 97.7 F (36.5 C)  TempSrc:   Oral Oral  SpO2: 98%   100%  Weight:  98.1 kg    Height:        Intake/Output Summary (Last 24 hours) at 10/21/2020 0907 Last data filed at 10/21/2020 0900 Gross per 24 hour  Intake 480 ml  Output 680 ml  Net -200 ml    Last 3 Weights 10/21/2020 10/20/2020 10/19/2020  Weight (lbs) 216 lb 4.3 oz 214 lb 219 lb 12.8 oz  Weight (kg) 98.1 kg 97.07 kg 99.7 kg    Body mass index is 31.03 kg/m.   General: Well nourished, well developed, in no acute distress Head: Atraumatic, normal size  Eyes: PEERLA, EOMI  Neck: Supple, no JVD Endocrine: No thryomegaly Cardiac: Normal S1, S2; tachycardia, no murmurs Lungs: Clear to auscultation bilaterally, no wheezing, rhonchi or rales  Abd: Soft, nontender, no hepatomegaly  Ext: No edema, pulses 2+ Musculoskeletal: No deformities, BUE and BLE strength normal and equal Skin: Warm and dry, no rashes   Neuro: Alert and oriented to person, place, time, and situation, CNII-XII grossly intact, no focal deficits  Psych: Normal mood and affect   Labs  High Sensitivity Troponin:   Recent Labs  Lab 10/12/20 1424 10/13/20 0237 10/13/20 0524 10/14/20 0549 10/14/20 0820  TROPONINIHS 164* 154* 193* 129* 120*     Cardiac EnzymesNo results for input(s): TROPONINI in the last 168 hours. No results for input(s): TROPIPOC in the last 168 hours.  Chemistry Recent Labs  Lab 10/19/20 1912 10/20/20 0138 10/21/20 0251  NA 134* 135 132*  K 4.5 4.1 4.6   CL 103 103 99  CO2 25 24 24   GLUCOSE 141* 133* 105*  BUN 25* 24* 27*  CREATININE 1.52* 1.48* 1.56*  CALCIUM 9.0 9.1 9.0  GFRNONAA 54* 56* 53*  ANIONGAP 6 8 9     Hematology Recent Labs  Lab 10/16/20 0343 10/16/20 1655 10/16/20 1703 10/16/20 1704 10/17/20 0231  WBC 5.7  --   --   --  7.4  RBC 4.14*  --   --   --  4.24  HGB 11.4*   < > 12.2* 12.2* 11.6*  HCT 34.9*   < > 36.0* 36.0* 36.3*  MCV 84.3  --   --   --  85.6  MCH 27.5  --   --   --  27.4  MCHC 32.7  --   --   --  32.0  RDW 14.9  --   --   --  15.2  PLT 243  --   --   --  360   < > = values in this interval not displayed.   BNPNo results for input(s): BNP, PROBNP in the last 168 hours.  DDimer No results for input(s): DDIMER in the last 168 hours.   Radiology  No results found.  Cardiac Studies  CMR 10/17/2020 1. Severe left ventricular dilation, with LVEDD 70 mm, but LVEDVi 142 mL/m2.   Normal left ventricular thickness, with intraventricular septal thickness of 7 mm, posterior wall thickness of 7 mm, and septal to posterior ratio < 1.5.   Severe left ventricular systolic dysfunction (LVEF = 18%). There is severe basal and mid anterior, anteroseptal, inferior, and lateral walls hypokinesis.   Left ventricular parametric mapping notable for slight elevation in the native T1 signal and normal T2 signal.   There is late gadolinium enhancement in the left ventricular myocardium: Patchy basal septal LGE, transmural basal inferolateral and anterolateral LGE with associated thinning that extends into the apex. There is inferior epicardial LGE in the base. Though this could be consistent with multi-vessel disease, lack of subendocardial involvement and patchy nature would favor sarcoidosis.   2. Normal right ventricular size with RVEDVI 58 mL/m2.   Normal right ventricular thickness.   Moderate right ventricular systolic dysfunction (RVEF =33%). There are no regional wall motion abnormalities or  aneurysms.   3.  Normal left and right atrial size.   4. Normal size of the aortic root, ascending aorta and pulmonary artery.   5.  Qualitatively there is at least moderate mitral regurgitation.   6.  Normal pericardium.  No pericardial effusion.   7. There are  bilateral effusions and regions within the right pleural effusion that are isointense with myocardium. This is best seen posterior to the left atrium. Correlates to area on recent CTPE with Hounsfield units of 258. This may be consistent with patient's bulky lymphadenopathy with some associated atelectasis. Ultimately, would recommended dedicated study if additional imaging need for non-cardiac pathology.   8.  Breath hold artifact noted.    Patient Profile  Nathan [name] is a 55 y.o. male with out medical history who was admitted on 10/03/2020 for acute systolic heart failure.  Found to have diffuse lymphadenopathy concerning for sarcoidosis.  Cardiac MRI is consistent with cardiac sarcoid.  Assessment & Plan   #Acute systolic heart failure, EF 18% #Cardiac sarcoidosis -Left heart cath without significant CAD.  CMR consistent with cardiac sarcoid.  Suspect active disease given ventricular ectopy. -Euvolemic. -Persistent tachycardia noted.  Digoxin 0.125 mg daily has been added.  Kidney function stable. -Due to hypotension we added BiDil yesterday.  Seems to be tolerating this okay. -I do not know if he will tolerate Entresto.  Advanced heart failure will see him today.  Could consider adding low-dose lisinopril. -We have also reached out for LifeVest.  Given frequent ectopy he will need this. -He is on an SGLT2 inhibitor.  #Nonsustained ventricular tachycardia -Secondary to active sarcoidosis.  On prednisone. -LifeVest has been ordered. -Mexiletine added by advanced heart failure.  May need to consider amiodarone.  #CKD -Kidney function remaining stable.  For questions or updates, please contact [facility] Please consult [email or web address] for contact info under   Time Spent with Patient: I have spent a total of 25 minutes with patient reviewing hospital notes, telemetry, EKGs, labs and examining the patient as well as establishing an assessment and plan that was discussed with the patient.  > 50% of time was spent in direct patient care.    Signed, Lenna Gilford. Flora Lipps, MD, [facility]  [facility]  10/21/2020 9:07 AM

## 2020-10-21 NOTE — Progress Notes (Signed)
Mobility Specialist Progress Note    10/21/20 1152  Mobility  Activity Ambulated in hall  Level of Assistance Independent  Assistive Device None  Distance Ambulated (ft) 900 ft  Mobility Ambulated independently in hallway  Mobility Response Tolerated well  Mobility performed by Mobility specialist  Bed Position Chair  $Mobility charge 1 Mobility   Pre-Mobility: 111 HR, 91/62 BP Post-Mobility: 125 HR, 111/75 BP  Pt found in chair and agreeable to ambulation. Asx during ambulation and returned to chair with call bell in reach and NT in room.   [place] Mobility Specialist  Mobility Specialist Phone: [phone]

## 2020-10-21 NOTE — Progress Notes (Addendum)
Nutrition Follow-up  DOCUMENTATION CODES:   Obesity unspecified  INTERVENTION:  -Continue Ensure Enlive BID, provides 350 kcal and 20 gm of protein each  -Continue MVI w/ minerals   NUTRITION DIAGNOSIS:   Inadequate oral intake related to poor appetite as evidenced by meal completion < 50%. - Improving, pt eating 75%-100% of meals   GOAL:   Patient will meet greater than or equal to 90% of their needs - Progressing   MONITOR:   PO intake, Supplement acceptance, Weight trends, I & O's  REASON FOR ASSESSMENT:   Malnutrition Screening Tool    ASSESSMENT:   Pt with PMH significant for GERD and BPH admitted with dyspnea, chest pain, and shortness of breath with bilateral pleural effusion. Newly diagnosed HF in the setting of cardiac sarcoidosis.  9/14: L&R Heart Catheterization 9/16: Flexible Video Fiberoptic Bronchoscopy   Pt reports that his appetite is good and was good prior to admission. 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. Typical intake includes: breakfast: breakfast sandwich or grits and eggs or eggs and rice, Lunch: nothing, Supper: "varies, home cooked meal", meat, vegetable, and starch. Pt reports that he enjoys the Ensures and agreed to continue to drink them.   Pt reports that his UBW is 220# and had not notice any weight changes prior to admission. Per EMR, pt has lost 10# or 4.3% of his body weight since admission, unsure how much is related to fluid. 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   Medications reviewed and include: Farxiga, MVI, Protonix,Prednisone, Bractrim DS Labs reviewed: BUN 27, Creatinine 1.56  Admission weight: 102.5 kg Current weight: 98.1 kg  UOP: 680 mL x 24 hours I/O's: -7433 mL since admit  NUTRITION - FOCUSED PHYSICAL EXAM:  Flowsheet Row Most Recent Value  Orbital Region No depletion  Upper Arm Region No  depletion  Thoracic and Lumbar Region No depletion  Buccal Region No depletion  Temple Region No depletion  Clavicle Bone Region Mild depletion  Clavicle and Acromion Bone Region Mild depletion  Scapular Bone Region Mild depletion  Dorsal Hand No depletion  Patellar Region No depletion  Anterior Thigh Region No depletion  Posterior Calf Region No depletion  Edema (RD Assessment) Mild  Hair Reviewed  Eyes Reviewed  Mouth Reviewed  Skin Reviewed  Nails Reviewed       Diet Order:   Diet Order             Diet - low sodium heart healthy           Diet Heart Room service appropriate? Yes; Fluid consistency: Thin  Diet effective now                   EDUCATION NEEDS:   No education needs have been identified at this time  Skin:  Skin Assessment: Reviewed RN Assessment  Last BM:  9/16  Height:   Ht Readings from Last 1 Encounters:  10/12/20 5\' 10"  (1.778 m)    Weight:   Wt Readings from Last 1 Encounters:  10/21/20 98.1 kg    Ideal Body Weight:  75.5 kg  BMI:  Body mass index is 31.03 kg/m.  Estimated Nutritional Needs:   Kcal:  2200-2400  Protein:  115-130 grams  Fluid:  >/= 2.2 L    Shelsey Rieth BS, PLDN Clinical Dietitian See [facility] for contact information.

## 2020-10-21 NOTE — Telephone Encounter (Signed)
Entered in error

## 2020-10-21 NOTE — Progress Notes (Signed)
   [name] has pulmonary/cardiac sarcoid and is at risk for cardiac arrhythmias.   NICM EF 18%.    Will need Life Vest. Ordered  Jamaiya Tunnell NP-C  4:06 PM

## 2020-10-22 LAB — BASIC METABOLIC PANEL
Anion gap: 9 (ref 5–15)
BUN: 29 mg/dL — ABNORMAL HIGH (ref 6–20)
CO2: 22 mmol/L (ref 22–32)
Calcium: 9.1 mg/dL (ref 8.9–10.3)
Chloride: 101 mmol/L (ref 98–111)
Creatinine, Ser: 1.6 mg/dL — ABNORMAL HIGH (ref 0.61–1.24)
GFR, Estimated: 51 mL/min — ABNORMAL LOW (ref >60)
Glucose, Bld: 130 mg/dL — ABNORMAL HIGH (ref 70–99)
Potassium: 4.6 mmol/L (ref 3.5–5.1)
Sodium: 132 mmol/L — ABNORMAL LOW (ref 135–145)

## 2020-10-22 LAB — MAGNESIUM: Magnesium: 2.4 mg/dL (ref 1.7–2.4)

## 2020-10-22 NOTE — Progress Notes (Signed)
Nursing Student Filed these and they were not complete, VS rechecked, No MEWS started

## 2020-10-22 NOTE — TOC CM/SW Note (Signed)
HF TOC CM received confirmation from [employer] rep, Alvino Chapel. Pt was approved for Life Vest. Scheduled delivery today. Made pt aware. Updated attending. Isidoro Donning RN3 CCM, Heart Failure TOC CM [phone]

## 2020-10-22 NOTE — Progress Notes (Signed)
I have evaluated the patient this AM. He was discharged yesterday but is awaiting a life vest which is scheduled to be delivered today. He remains stable for discharge.   Calvert Cantor, MD

## 2020-10-22 NOTE — Progress Notes (Signed)
Mobility Specialist Progress Note    10/22/20 1109  Mobility  Activity Ambulated in hall  Level of Assistance Independent  Assistive Device None  Distance Ambulated (ft) 900 ft  Mobility Ambulated independently in hallway  Mobility Response Tolerated well  Mobility performed by Mobility specialist  Bed Position Chair  $Mobility charge 1 Mobility   Pre-Mobility: 118 HR, 128/104 BP Post-Mobility: 124 HR, 116/76 BP  Pt found in chair and agreeable to walk. Asx throughout walk and returned to chair with call bell in reach.   [place] Mobility Specialist  Mobility Specialist Phone: [phone]

## 2020-10-22 NOTE — Progress Notes (Signed)
Both IVs have been removed.  The education for portable machine, medicines, and making follow up appointments have been completed.  Harriet Masson, RN

## 2020-10-23 ENCOUNTER — Telehealth (HOSPITAL_COMMUNITY): Payer: Self-pay | Admitting: Internal Medicine

## 2020-10-23 LAB — CYTOLOGY - NON PAP

## 2020-10-23 LAB — ANAEROBIC CULTURE W GRAM STAIN

## 2020-10-23 LAB — AEROBIC/ANAEROBIC CULTURE W GRAM STAIN (SURGICAL/DEEP WOUND)
Culture: NO GROWTH
Gram Stain: NONE SEEN

## 2020-10-23 NOTE — Telephone Encounter (Signed)
Delcine Honse (wife) stated pt was seen in  hospital , and was told to f/u w/heart failure clinic, but no appt was made, Can I set up appt, and with what provider? She can be reached @[phone] . Thanks

## 2020-10-25 ENCOUNTER — Inpatient Hospital Stay (HOSPITAL_COMMUNITY)
Admission: EM | Admit: 2020-10-25 | Discharge: 2020-10-29 | DRG: 227 | Disposition: A | Discharge status: Home / Self Care | Payer: 59 | Attending: Internal Medicine | Admitting: Internal Medicine

## 2020-10-25 ENCOUNTER — Encounter (HOSPITAL_COMMUNITY): Payer: Self-pay

## 2020-10-25 ENCOUNTER — Other Ambulatory Visit: Payer: Self-pay

## 2020-10-25 ENCOUNTER — Emergency Department (HOSPITAL_COMMUNITY): Payer: 59

## 2020-10-25 DIAGNOSIS — I272 Pulmonary hypertension, unspecified: Secondary | ICD-10-CM | POA: Diagnosis present

## 2020-10-25 DIAGNOSIS — Z01818 Encounter for other preprocedural examination: Secondary | ICD-10-CM

## 2020-10-25 DIAGNOSIS — I428 Other cardiomyopathies: Secondary | ICD-10-CM | POA: Diagnosis present

## 2020-10-25 DIAGNOSIS — Z95810 Presence of automatic (implantable) cardiac defibrillator: Secondary | ICD-10-CM

## 2020-10-25 DIAGNOSIS — I472 Ventricular tachycardia: Secondary | ICD-10-CM | POA: Diagnosis not present

## 2020-10-25 DIAGNOSIS — I952 Hypotension due to drugs: Secondary | ICD-10-CM | POA: Diagnosis present

## 2020-10-25 DIAGNOSIS — I493 Ventricular premature depolarization: Secondary | ICD-10-CM | POA: Diagnosis present

## 2020-10-25 DIAGNOSIS — Z87891 Personal history of nicotine dependence: Secondary | ICD-10-CM

## 2020-10-25 DIAGNOSIS — T462X5A Adverse effect of other antidysrhythmic drugs, initial encounter: Secondary | ICD-10-CM | POA: Diagnosis present

## 2020-10-25 DIAGNOSIS — D8685 Sarcoid myocarditis: Secondary | ICD-10-CM | POA: Diagnosis present

## 2020-10-25 DIAGNOSIS — R590 Localized enlarged lymph nodes: Secondary | ICD-10-CM | POA: Diagnosis present

## 2020-10-25 DIAGNOSIS — Z20822 Contact with and (suspected) exposure to covid-19: Secondary | ICD-10-CM | POA: Diagnosis present

## 2020-10-25 DIAGNOSIS — Z7982 Long term (current) use of aspirin: Secondary | ICD-10-CM

## 2020-10-25 DIAGNOSIS — I5022 Chronic systolic (congestive) heart failure: Secondary | ICD-10-CM | POA: Diagnosis present

## 2020-10-25 DIAGNOSIS — I340 Nonrheumatic mitral (valve) insufficiency: Secondary | ICD-10-CM | POA: Diagnosis present

## 2020-10-25 DIAGNOSIS — Z79899 Other long term (current) drug therapy: Secondary | ICD-10-CM

## 2020-10-25 DIAGNOSIS — K219 Gastro-esophageal reflux disease without esophagitis: Secondary | ICD-10-CM | POA: Diagnosis present

## 2020-10-25 DIAGNOSIS — Z7952 Long term (current) use of systemic steroids: Secondary | ICD-10-CM

## 2020-10-25 DIAGNOSIS — N1831 Chronic kidney disease, stage 3a: Secondary | ICD-10-CM | POA: Diagnosis present

## 2020-10-25 DIAGNOSIS — N400 Enlarged prostate without lower urinary tract symptoms: Secondary | ICD-10-CM | POA: Diagnosis present

## 2020-10-25 DIAGNOSIS — I4729 Other ventricular tachycardia: Secondary | ICD-10-CM

## 2020-10-25 LAB — COMPREHENSIVE METABOLIC PANEL
ALT: 23 U/L (ref 0–44)
AST: 26 U/L (ref 15–41)
Albumin: 3.4 g/dL — ABNORMAL LOW (ref 3.5–5.0)
Alkaline Phosphatase: 52 U/L (ref 38–126)
Anion gap: 9 (ref 5–15)
BUN: 30 mg/dL — ABNORMAL HIGH (ref 6–20)
CO2: 25 mmol/L (ref 22–32)
Calcium: 9.4 mg/dL (ref 8.9–10.3)
Chloride: 100 mmol/L (ref 98–111)
Creatinine, Ser: 1.66 mg/dL — ABNORMAL HIGH (ref 0.61–1.24)
GFR, Estimated: 49 mL/min — ABNORMAL LOW (ref >60)
Glucose, Bld: 109 mg/dL — ABNORMAL HIGH (ref 70–99)
Potassium: 4 mmol/L (ref 3.5–5.1)
Sodium: 134 mmol/L — ABNORMAL LOW (ref 135–145)
Total Bilirubin: 0.5 mg/dL (ref 0.3–1.2)
Total Protein: 7 g/dL (ref 6.5–8.1)

## 2020-10-25 LAB — I-STAT CHEM 8, ED
BUN: 34 mg/dL — ABNORMAL HIGH (ref 6–20)
Calcium, Ion: 1.15 mmol/L (ref 1.15–1.40)
Chloride: 101 mmol/L (ref 98–111)
Creatinine, Ser: 1.7 mg/dL — ABNORMAL HIGH (ref 0.61–1.24)
Glucose, Bld: 108 mg/dL — ABNORMAL HIGH (ref 70–99)
HCT: 40 % (ref 39.0–52.0)
Hemoglobin: 13.6 g/dL (ref 13.0–17.0)
Potassium: 4.1 mmol/L (ref 3.5–5.1)
Sodium: 136 mmol/L (ref 135–145)
TCO2: 26 mmol/L (ref 22–32)

## 2020-10-25 LAB — CBC WITH DIFFERENTIAL/PLATELET
Abs Immature Granulocytes: 0.05 10*3/uL (ref 0.00–0.07)
Basophils Absolute: 0 10*3/uL (ref 0.0–0.1)
Basophils Relative: 0 %
Eosinophils Absolute: 0.2 10*3/uL (ref 0.0–0.5)
Eosinophils Relative: 3 %
HCT: 40.3 % (ref 39.0–52.0)
Hemoglobin: 12.6 g/dL — ABNORMAL LOW (ref 13.0–17.0)
Immature Granulocytes: 1 %
Lymphocytes Relative: 19 %
Lymphs Abs: 1.2 10*3/uL (ref 0.7–4.0)
MCH: 27.5 pg (ref 26.0–34.0)
MCHC: 31.3 g/dL (ref 30.0–36.0)
MCV: 87.8 fL (ref 80.0–100.0)
Monocytes Absolute: 0.7 10*3/uL (ref 0.1–1.0)
Monocytes Relative: 11 %
Neutro Abs: 4.2 10*3/uL (ref 1.7–7.7)
Neutrophils Relative %: 66 %
Platelets: 339 10*3/uL (ref 150–400)
RBC: 4.59 MIL/uL (ref 4.22–5.81)
RDW: 15.4 % (ref 11.5–15.5)
WBC: 6.3 10*3/uL (ref 4.0–10.5)
nRBC: 0 % (ref 0.0–0.2)

## 2020-10-25 LAB — PROTIME-INR
INR: 1 (ref 0.8–1.2)
Prothrombin Time: 13.7 seconds (ref 11.4–15.2)

## 2020-10-25 LAB — BRAIN NATRIURETIC PEPTIDE: B Natriuretic Peptide: 344.2 pg/mL — ABNORMAL HIGH (ref 0.0–100.0)

## 2020-10-25 LAB — RESP PANEL BY RT-PCR (FLU A&B, COVID) ARPGX2
Influenza A by PCR: NEGATIVE
Influenza B by PCR: NEGATIVE
SARS Coronavirus 2 by RT PCR: NEGATIVE

## 2020-10-25 LAB — MAGNESIUM: Magnesium: 2.3 mg/dL (ref 1.7–2.4)

## 2020-10-25 LAB — APTT: aPTT: 27 seconds (ref 24–36)

## 2020-10-25 LAB — TROPONIN I (HIGH SENSITIVITY)
Troponin I (High Sensitivity): 26 ng/L — ABNORMAL HIGH (ref <18)
Troponin I (High Sensitivity): 29 ng/L — ABNORMAL HIGH (ref <18)

## 2020-10-25 LAB — DIGOXIN LEVEL: Digoxin Level: 0.6 ng/mL — ABNORMAL LOW (ref 0.8–2.0)

## 2020-10-25 MED ORDER — CERTAVITE/ANTIOXIDANTS PO TABS: 1.0000 | ORAL_TABLET | Freq: Every day | ORAL | Status: DC

## 2020-10-25 MED ORDER — ENOXAPARIN SODIUM 40 MG/0.4ML IJ SOSY
40.0000 mg | PREFILLED_SYRINGE | INTRAMUSCULAR | Status: DC
Start: 2020-10-25 — End: 2020-10-28
  Administered 2020-10-25 – 2020-10-27 (×3): 40 mg via SUBCUTANEOUS
  Filled 2020-10-25 (×3): qty 0.4

## 2020-10-25 MED ORDER — ADULT MULTIVITAMIN W/MINERALS CH
1.0000 | ORAL_TABLET | Freq: Every day | ORAL | Status: DC
  Administered 2020-10-25 – 2020-10-29 (×5): 1 via ORAL
  Filled 2020-10-25 (×5): qty 1

## 2020-10-25 MED ORDER — SULFAMETHOXAZOLE-TRIMETHOPRIM 800-160 MG PO TABS
1.0000 | ORAL_TABLET | ORAL | Status: DC
  Administered 2020-10-25 – 2020-10-28 (×2): 1 via ORAL
  Filled 2020-10-25 (×2): qty 1

## 2020-10-25 MED ORDER — CALCIUM CARBONATE ANTACID 500 MG PO CHEW
1.0000 | CHEWABLE_TABLET | Freq: Two times a day (BID) | ORAL | Status: DC
  Administered 2020-10-25 – 2020-10-29 (×9): 200 mg via ORAL
  Filled 2020-10-25 (×9): qty 1

## 2020-10-25 MED ORDER — SODIUM CHLORIDE 0.9% FLUSH
3.0000 mL | Freq: Two times a day (BID) | INTRAVENOUS | Status: DC
Start: 2020-10-25 — End: 2020-10-29
  Administered 2020-10-25 – 2020-10-29 (×3): 3 mL via INTRAVENOUS

## 2020-10-25 MED ORDER — CALCIUM CARBONATE-VITAMIN D3 600-400 MG-UNIT PO TABS: 1.0000 | ORAL_TABLET | Freq: Two times a day (BID) | ORAL | Status: DC

## 2020-10-25 MED ORDER — TAMSULOSIN HCL 0.4 MG PO CAPS
0.8000 mg | ORAL_CAPSULE | Freq: Every day | ORAL | Status: DC
  Administered 2020-10-25 – 2020-10-29 (×5): 0.8 mg via ORAL
  Filled 2020-10-25 (×5): qty 2

## 2020-10-25 MED ORDER — AMIODARONE HCL IN DEXTROSE 360-4.14 MG/200ML-% IV SOLN
60.0000 mg/h | INTRAVENOUS | Status: DC
  Administered 2020-10-25 (×2): 60 mg/h via INTRAVENOUS
  Filled 2020-10-25: qty 200

## 2020-10-25 MED ORDER — METHOTREXATE 2.5 MG PO TABS: 10.0000 mg | ORAL_TABLET | ORAL | Status: DC

## 2020-10-25 MED ORDER — AMIODARONE LOAD VIA INFUSION
150.0000 mg | Freq: Once | INTRAVENOUS | Status: AC
  Administered 2020-10-25: 150 mg via INTRAVENOUS
  Filled 2020-10-25: qty 83.34

## 2020-10-25 MED ORDER — MIDODRINE HCL 5 MG PO TABS
5.0000 mg | ORAL_TABLET | Freq: Three times a day (TID) | ORAL | Status: DC
  Administered 2020-10-25 – 2020-10-29 (×11): 5 mg via ORAL
  Filled 2020-10-25 (×12): qty 1

## 2020-10-25 MED ORDER — ONDANSETRON HCL 4 MG/2ML IJ SOLN: 4.0000 mg | Freq: Four times a day (QID) | INTRAMUSCULAR | Status: DC | PRN

## 2020-10-25 MED ORDER — SODIUM CHLORIDE 0.9% FLUSH: 3.0000 mL | INTRAVENOUS | Status: DC | PRN

## 2020-10-25 MED ORDER — LORATADINE 10 MG PO TABS
10.0000 mg | ORAL_TABLET | Freq: Every day | ORAL | Status: DC
  Administered 2020-10-25 – 2020-10-29 (×5): 10 mg via ORAL
  Filled 2020-10-25 (×5): qty 1

## 2020-10-25 MED ORDER — FOLIC ACID 1 MG PO TABS
1.0000 mg | ORAL_TABLET | Freq: Every day | ORAL | Status: DC
  Administered 2020-10-25 – 2020-10-29 (×5): 1 mg via ORAL
  Filled 2020-10-25 (×5): qty 1

## 2020-10-25 MED ORDER — PANTOPRAZOLE SODIUM 40 MG PO TBEC
40.0000 mg | DELAYED_RELEASE_TABLET | Freq: Every day | ORAL | Status: DC
  Administered 2020-10-26 – 2020-10-29 (×4): 40 mg via ORAL
  Filled 2020-10-25 (×5): qty 1

## 2020-10-25 MED ORDER — SODIUM CHLORIDE 0.9 % IV SOLN: 250.0000 mL | INTRAVENOUS | Status: DC | PRN

## 2020-10-25 MED ORDER — AMIODARONE HCL IN DEXTROSE 360-4.14 MG/200ML-% IV SOLN
30.0000 mg/h | INTRAVENOUS | Status: DC
  Administered 2020-10-25 – 2020-10-28 (×8): 30 mg/h via INTRAVENOUS
  Filled 2020-10-25 (×5): qty 200

## 2020-10-25 MED ORDER — CHOLECALCIFEROL 10 MCG (400 UNIT) PO TABS
400.0000 [IU] | ORAL_TABLET | Freq: Two times a day (BID) | ORAL | Status: DC
  Administered 2020-10-25 – 2020-10-29 (×9): 400 [IU] via ORAL
  Filled 2020-10-25 (×11): qty 1

## 2020-10-25 MED ORDER — PREDNISONE 20 MG PO TABS
40.0000 mg | ORAL_TABLET | Freq: Every day | ORAL | Status: DC
  Administered 2020-10-26 – 2020-10-29 (×4): 40 mg via ORAL
  Filled 2020-10-25 (×4): qty 2

## 2020-10-25 MED ORDER — ACETAMINOPHEN 325 MG PO TABS
650.0000 mg | ORAL_TABLET | ORAL | Status: DC | PRN
  Administered 2020-10-26 – 2020-10-29 (×2): 650 mg via ORAL
  Filled 2020-10-25 (×2): qty 2

## 2020-10-25 NOTE — ED Notes (Signed)
Pt bib [facility] from home with complaints of palpitations. Pt was discharged with a life vest on three days ago, pt noticed "buzzing" on his vest when he was having palpitations and called 911. Pt denies pain, n/v/d, dizziness. On arrival, EMS noticed pt to be in vtach and started amiodarone with no change in rate or rhythm. Pt received 150mg  amiodarone and NS with EMS.

## 2020-10-25 NOTE — ED Provider Notes (Signed)
[facility] EMERGENCY DEPARTMENT Provider Note   CSN: [id number] Arrival date & time: 10/25/20  9381     History No chief complaint on file.   Nathan [name] is a 55 y.o. male.  HPI Patient presents for heart palpitations and external defibrillator alarm. He had a recent hospitalization on 9/10 for progressive shortness of breath and left-sided chest pain.  He was hospitalized for 10 days.  During that time, echocardiogram showed a EF of 30 to 35% with global hypokinesis, moderate pulmonary hypertension, and severe mitral valve regurgitation.  He underwent a right and left heart cath which showed normal coronary arteries.  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  He underwent biopsy of mediastinal adenopathy.  He was started on prednisone, methotrexate, BiDil, Farxiga, and digoxin.  He was diuresed 6 L.  He completed a course of antibiotics for CAP.  He is continuing Bactrim for PJP prophylaxis.  Also during his hospitalization, he had an AKI which improved.  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  He was discharged with a LifeVest.  He is on mexiletine.  This morning, he was sitting at his desk at around 830.  He experienced palpitations and had an alarm go off on his LifeVest.  He called 911.  EMS provided 150 cc of IV fluids and 150 mg of amiodarone.  He had continued nonsustained tachydysrhythmias.  Blood pressures remained normal and patient endorsed only mild symptoms.  Currently, he denies any symptoms.       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   NSVT (nonsustained ventricular tachycardia) (HCC) 10/21/2020   AKI (acute kidney injury) (HCC) 10/21/2020   Pleural effusion    Acute combined systolic and diastolic heart failure (HCC)    Sarcoidosis    Severe mitral regurgitation    Mediastinal lymphadenopathy 10/12/2020   Dyspnea 10/12/2020    Past Surgical History:  Procedure Laterality Date   BRONCHIAL NEEDLE ASPIRATION BIOPSY  10/18/2020   Procedure: BRONCHIAL NEEDLE ASPIRATION BIOPSIES;  Surgeon: Josephine Igo, DO;  Location: [facility] ENDOSCOPY;  Service: Pulmonary;;   BRONCHIAL WASHINGS  10/18/2020   Procedure: BRONCHIAL WASHINGS;  Surgeon: Josephine Igo, DO;  Location: [facility] ENDOSCOPY;  Service: Pulmonary;;   RIGHT/LEFT HEART CATH AND CORONARY ANGIOGRAPHY N/A 10/16/2020   Procedure: RIGHT/LEFT HEART CATH AND CORONARY ANGIOGRAPHY;  Surgeon: Marykay Lex, MD;  Location: [facility] INVASIVE CV LAB;  Service: Cardiovascular;  Laterality: N/A;   VIDEO BRONCHOSCOPY WITH ENDOBRONCHIAL ULTRASOUND Bilateral 10/18/2020   Procedure: VIDEO BRONCHOSCOPY WITH ENDOBRONCHIAL ULTRASOUND;  Surgeon: Josephine Igo, DO;  Location: [facility] ENDOSCOPY;  Service: Pulmonary;  Laterality: Bilateral;       Family History  Problem Relation Age of Onset   Cancer Father 68   Diabetes Father     Social History   Tobacco Use   Smoking status: Former    Packs/day: 0.50    Types: Cigarettes    Quit date: 02/02/2010    Years since quitting: 10.7   Smokeless tobacco: Never  Substance Use Topics   Alcohol use: Yes   Drug use: Not Currently    Home Medications Prior to Admission medications   Medication Sig Start Date End Date Taking? Authorizing Provider  aspirin 81 MG EC tablet Take 1 tablet (81 mg total) by mouth daily. Swallow whole. 10/22/20  Yes Calvert Cantor, MD  Calcium Carbonate-Vitamin D3 (CALCIUM 600+D) 600-400 MG-UNIT TABS Take 1 tablet by mouth 2 (two) times daily. 10/21/20 10/21/21 Yes Calvert Cantor, MD   cetirizine (ZYRTEC) 10 MG tablet Take 10 mg by mouth daily.   Yes [provider]  dapagliflozin propanediol (FARXIGA) 10 MG TABS tablet Take 1 tablet (10 mg total) by mouth daily. 10/22/20  Yes Calvert Cantor, MD  digoxin (LANOXIN) 0.125 MG tablet Take 1 tablet (0.125 mg total) by mouth daily. 10/22/20  Yes Calvert Cantor, MD  feeding supplement (ENSURE ENLIVE / ENSURE PLUS) LIQD Take 237 mLs by mouth 2 (two) times daily between meals. 10/21/20  Yes Calvert Cantor, MD  folic acid (FOLVITE) 1 MG tablet Take 1 tablet (1 mg total) by mouth daily. 10/21/20 10/21/21 Yes Calvert Cantor, MD  hydrALAZINE (APRESOLINE) 25 MG tablet Take 1/2 tablet (12.5 mg total) by mouth 3 (three) times daily. 10/21/20 10/21/21 Yes Calvert Cantor, MD  isosorbide mononitrate (IMDUR) 30 MG 24 hr tablet Take 1/2 tablet (15 mg total) by mouth daily. 10/21/20 10/21/21 Yes Calvert Cantor, MD  methotrexate (RHEUMATREX) 2.5 MG tablet Take 4 tablets (10 mg total) by mouth every Wednesday. Caution:Chemotherapy. Protect from light. 10/23/20  Yes Calvert Cantor, MD  mexiletine (MEXITIL) 200 MG capsule Take 1 capsule (200 mg total) by mouth 2 (two) times daily. 10/21/20  Yes Calvert Cantor, MD  Multiple Vitamins-Minerals (CERTAVITE/ANTIOXIDANTS) TABS Take 1 tablet by mouth daily. 10/22/20  Yes Calvert Cantor, MD  omeprazole (PRILOSEC) 40 MG capsule Take 40 mg by mouth every morning. 08/30/20  Yes [provider]  predniSONE (DELTASONE) 20 MG tablet Take 2 tablets (40 mg total) by mouth daily with breakfast. 10/22/20  Yes Calvert Cantor, MD  sulfamethoxazole-trimethoprim (BACTRIM DS) 800-160 MG tablet Take 1 tablet by mouth every Monday, Wednesday, and Friday. 10/23/20  Yes Calvert Cantor, MD  tamsulosin (FLOMAX) 0.4 MG CAPS capsule Take 2 capsules (0.8 mg total) by mouth daily. 10/21/20  Yes Rizwan, Ladell Heads, MD  montelukast (SINGULAIR) 10 MG tablet Take 10 mg by mouth daily. Patient not taking: Reported on 10/25/2020 08/30/20   [provider]    Allergies    Patient has no known allergies.  Review of Systems   Review of Systems  Constitutional:  Negative for activity change, appetite change, chills, diaphoresis, fatigue and fever.  HENT:  Negative for ear pain and sore throat.   Eyes:  Negative for pain and visual disturbance.  Respiratory:  Negative for cough, chest tightness and shortness of breath.   Cardiovascular:  Positive for palpitations. Negative for chest pain.  Gastrointestinal:  Negative for abdominal pain, nausea and vomiting.  Genitourinary:  Negative for dysuria and hematuria.  Musculoskeletal:  Negative for arthralgias, back pain and neck pain.  Skin:  Negative for color change and rash.  Neurological:  Negative for dizziness, seizures, syncope, weakness and light-headedness.  All other systems reviewed and are negative.  Physical Exam Updated Vital Signs BP (!) 108/56   Pulse (!) 48   Temp 97.9 F (36.6 C) (Oral)   Resp (!) 21   Ht 5\' 10"  (1.778 m)   Wt 97.5 kg   SpO2 100%   BMI  30.85 kg/m   Physical Exam Vitals and nursing note reviewed.  Constitutional:      General: He is not in acute distress.    Appearance: Normal appearance. He is well-developed. He is not ill-appearing, toxic-appearing or diaphoretic.  HENT:     Head: Normocephalic and atraumatic.     Right Ear: External ear normal.     Left Ear: External ear normal.     Nose: Nose normal.  Eyes:     Extraocular Movements: Extraocular movements intact.     Conjunctiva/sclera: Conjunctivae normal.  Cardiovascular:     Rate and Rhythm: Tachycardia present. Rhythm irregular.     Heart sounds: No murmur heard. Pulmonary:     Effort: Pulmonary effort is normal. No respiratory distress.     Breath sounds: Normal breath sounds. No wheezing or rales.  Chest:     Chest wall: No tenderness.  Abdominal:     Palpations: Abdomen is soft.     Tenderness: There is no abdominal tenderness.  Musculoskeletal:        General: No swelling  or tenderness. Normal range of motion.     Cervical back: Normal range of motion and neck supple.  Skin:    General: Skin is warm and dry.  Neurological:     General: No focal deficit present.     Mental Status: He is alert and oriented to person, place, and time.     Cranial Nerves: No cranial nerve deficit.     Sensory: No sensory deficit.     Motor: No weakness.  Psychiatric:        Mood and Affect: Mood normal.        Behavior: Behavior normal.        Thought Content: Thought content normal.        Judgment: Judgment normal.    ED Results / Procedures / Treatments   Labs (all labs ordered are listed, but only abnormal results are displayed) Labs Reviewed  COMPREHENSIVE METABOLIC PANEL - Abnormal; Notable for the following components:      Result Value   Sodium 134 (*)    Glucose, Bld 109 (*)    BUN 30 (*)    Creatinine, Ser 1.66 (*)    Albumin 3.4 (*)    GFR, Estimated 49 (*)    All other components within normal limits  BRAIN NATRIURETIC PEPTIDE - Abnormal; Notable for the following components:   B Natriuretic Peptide 344.2 (*)    All other components within normal limits  CBC WITH DIFFERENTIAL/PLATELET - Abnormal; Notable for the following components:   Hemoglobin 12.6 (*)    All other components within normal limits  I-STAT CHEM 8, ED - Abnormal; Notable for the following components:   BUN 34 (*)    Creatinine, Ser 1.70 (*)    Glucose, Bld 108 (*)    All other components within normal limits  TROPONIN I (HIGH SENSITIVITY) - Abnormal; Notable for the following components:   Troponin I (High Sensitivity) 26 (*)    All other components within normal limits  TROPONIN I (HIGH SENSITIVITY) - Abnormal; Notable for the following components:   Troponin I (High Sensitivity) 29 (*)    All other components within normal limits  RESP PANEL BY RT-PCR (FLU A&B, COVID) ARPGX2  MAGNESIUM  APTT  PROTIME-INR  DIGOXIN LEVEL  CBC  CREATININE, SERUM    EKG EKG  Interpretation  Date/Time:  Friday October 25 2020 09:42:44 EDT Ventricular Rate:  116 PR Interval:  QRS Duration: 135 QT Interval:  353 QTC Calculation: 451 R Axis:   80 Text Interpretation: Bigeminy Ventricular tachycardia, unsustained Nonspecific intraventricular conduction delay Abnormal T, consider ischemia, diffuse leads Artifact in lead(s) I II aVR aVL Confirmed by Gloris Manchester [phone]) on 10/25/2020 9:54:03 AM  Radiology DG Chest Portable 1 View  Result Date: 10/25/2020 CLINICAL DATA:  Palpitations EXAM: PORTABLE CHEST 1 VIEW COMPARISON:  10/14/2020 FINDINGS: Pacer/defibrillator projecting over the left side of the chest. Prior median sternotomy. Midline trachea. Borderline cardiomegaly. Right paratracheal soft tissue fullness corresponds to mediastinal adenopathy on prior CT. Bilateral hilar adenopathy as well. No pleural effusion or pneumothorax.  Resolved airspace disease. IMPRESSION: Thoracic adenopathy, as on recent CT. No evidence of residual airspace disease. Electronically Signed   By: Jeronimo Greaves M.D.   On: 10/25/2020 10:39    Procedures Procedures   Medications Ordered in ED Medications  amiodarone (NEXTERONE) 1.8 mg/mL load via infusion 150 mg (150 mg Intravenous Bolus from Bag 10/25/20 1023)    Followed by  amiodarone (NEXTERONE PREMIX) 360-4.14 MG/200ML-% (1.8 mg/mL) IV infusion (60 mg/hr Intravenous New Bag/Given 10/25/20 1347)    Followed by  amiodarone (NEXTERONE PREMIX) 360-4.14 MG/200ML-% (1.8 mg/mL) IV infusion (30 mg/hr Intravenous New Bag/Given 10/25/20 1625)  midodrine (PROAMATINE) tablet 5 mg (5 mg Oral Given 10/25/20 1159)  pantoprazole (PROTONIX) EC tablet 40 mg (has no administration in time range)  sulfamethoxazole-trimethoprim (BACTRIM DS) 800-160 MG per tablet 1 tablet (1 tablet Oral Given 10/25/20 1433)  methotrexate (RHEUMATREX) tablet 10 mg (has no administration in time range)  predniSONE (DELTASONE) tablet 40 mg (has no administration in time range)   tamsulosin (FLOMAX) capsule 0.8 mg (0.8 mg Oral Given 10/25/20 1432)  folic acid (FOLVITE) tablet 1 mg (1 mg Oral Given 10/25/20 1433)  loratadine (CLARITIN) tablet 10 mg (10 mg Oral Given 10/25/20 1433)  sodium chloride flush (NS) 0.9 % injection 3 mL (3 mLs Intravenous Given 10/25/20 1437)  sodium chloride flush (NS) 0.9 % injection 3 mL (has no administration in time range)  0.9 %  sodium chloride infusion (has no administration in time range)  acetaminophen (TYLENOL) tablet 650 mg (has no administration in time range)  ondansetron (ZOFRAN) injection 4 mg (has no administration in time range)  enoxaparin (LOVENOX) injection 40 mg (40 mg Subcutaneous Given 10/25/20 1434)  calcium carbonate (TUMS - dosed in mg elemental calcium) chewable tablet 200 mg of elemental calcium (200 mg of elemental calcium Oral Given 10/25/20 1432)  cholecalciferol (VITAMIN D3) tablet 400 Units (400 Units Oral Given 10/25/20 1434)  multivitamin with minerals tablet 1 tablet (1 tablet Oral Given 10/25/20 1433)    ED Course  I have reviewed the triage vital signs and the nursing notes.  Pertinent labs & imaging results that were available during my care of the patient were reviewed by me and considered in my medical decision making (see chart for details).    MDM Rules/Calculators/A&P                          CRITICAL CARE Performed by: Gloris Manchester   Total critical care time: 35 minutes  Critical care time was exclusive of separately billable procedures and treating other patients.  Critical care was necessary to treat or prevent imminent or life-threatening deterioration.  Critical care was time spent personally by me on the following activities: development of treatment plan with patient and/or surrogate as well as nursing, discussions with consultants, evaluation of patient's response  to treatment, examination of patient, obtaining history from patient or surrogate, ordering and performing treatments and  interventions, ordering and review of laboratory studies, ordering and review of radiographic studies, pulse oximetry and re-evaluation of patient's condition.   Patient is a 55 year old male with history of cardiac sarcoidosis, CHF, and a recent hospitalization who presents for palpitations and LifeVest alarm this morning.  He denies any other symptoms.  Prior to arrival, he got 150 mg of amiodarone.  He is on multiple other cardiac medications, including digoxin and mexiletine.  Patient is well-appearing upon arrival.  EMS did provide 12 leads that showed runs of V. tach.  Currently, patient is in sinus rhythm with frequent PVCs and short nonsustained runs of V. tach.  On EKG, it does appear that he has a bigeminy rhythm that is interspersed with 4-5 beat runs of V. tach.  ZOLL pads were placed on patient.  Diagnostic work-up was initiated.  Electrolytes in a normal/optimal range.  Troponin is only mildly elevated.  Patient's creatinine is at baseline.  He will require medication adjustments to optimize cardiac rhythm.  Advanced heart failure team evaluated the patient in the ED.  They will plan on admission. Initial plan will be to manage tachydysrhythmia by amiodarone bolus and drip which was ordered by admitting team.  Patient was admitted for further management.  Final Clinical Impression(s) / ED Diagnoses Final diagnoses:  Ventricular tachycardia, non-sustained [facility])    Rx / DC Orders ED Discharge Orders     None        Gloris Manchester, MD 10/25/20 [id number]

## 2020-10-25 NOTE — Progress Notes (Signed)
   10/25/20 2000  Assess: MEWS Score  BP 103/68  Pulse Rate (!) 106  ECG Heart Rate (!) 107  Resp (!) 27  SpO2 100 %  Assess: MEWS Score  MEWS Temp 0  MEWS Systolic 0  MEWS Pulse 1  MEWS RR 2  MEWS LOC 0  MEWS Score 3  MEWS Score Color Yellow  Assess: if the MEWS score is Yellow or Red  Were vital signs taken at a resting state? Yes  Focused Assessment No change from prior assessment  Early Detection of Sepsis Score *See Row Information* Low  MEWS guidelines implemented *See Row Information* No, previously red, continue vital signs every 4 hours  Treat  MEWS Interventions Other (Comment)  Pain Scale 0-10  Pain Score 0  Breathing 0  Negative Vocalization 0  Facial Expression 0  Body Language 0  Consolability 0  PAINAD Score 0  Take Vital Signs  Increase Vital Sign Frequency  Yellow: Q 2hr X 2 then Q 4hr X 2, if remains yellow, continue Q 4hrs  Escalate  MEWS: Escalate Yellow: discuss with charge nurse/RN and consider discussing with provider and RRT  Notify: Charge Nurse/RN  Name of Charge Nurse/RN Notified Lin RN  Date Charge Nurse/RN Notified 10/25/20  Time Charge Nurse/RN Notified 2006

## 2020-10-25 NOTE — Progress Notes (Signed)
Heart Failure Navigator Progress Note  Assessed for Heart & Vascular TOC clinic readiness.  Patient does not meet criteria due to AHF rounding team consulted.   Navigator available for reassessment of patient.   Zula Hovsepian, MSN, RN Heart Failure Nurse Navigator [phone]   

## 2020-10-25 NOTE — Consult Note (Signed)
Cardiology Consultation:   Patient ID: Nathan [name] MRN: [id number]; DOB: 09-12-1965  Admit date: 10/25/2020 Date of Consult: 10/25/2020  PCP:  Dois Davenport, MD   [facility] Providers Cardiologist:  Chilton Si, MD    Patient Profile:   Nathan [name] is a 55 y.o. 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 Shirlee Latch.  History of Present Illness:   Mr. Strike was recently here admitted 10/13/20 with CP, SOB, note prior CT 02/12/2020 revealed bulky lymphadenopathy concerning for lymphoproliferative disorder or malignant lymphadenopathy.  He also had bilateral pleural effusions and groundglass opacities centrally in both lungs.  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  He was treated with methotrexate and folic acid but self discontinued after 6 months in 2021.  Ultimately during his stay noted   Echo: EF 25% dilated. RV moderately HK. Mod-severe MR Personally reviewed Cath 10/16/20: No CAD PA 46/19 (33) PCWP 27 Fick 6.7/3.1 cMRI: EF 18% severely dilated LVIDd 7.0 cm, RV moderately down LGE pattern consistent with cardiacSarcoidosis Underwent bronch biopsy of mediastinal LAD on 10/18/20  Observed to have PVCs, NSVT, mexiletine was added to his tx,   AHF team was consulted, started on prednisone, PJP pro[hylaxis and GDMT as able, no ACE/ARNI, spiro due to low BP and hyperkalemia, BB also needed to be stopped 2/2 hypotension. He was discharged 10/21/20 with life vest  He returned to the ER today via EMS that he called 2/2 palpitations and his life vest alarming.  He was observed to have  frequent PVCs, NSVTs and given a bolus of amiodarone in route. Once in the ER he continued to have multiple episodes of NSVT and started on amio gtt.  EP is asked to see to evaluate for ICD implant.  LABS  K+ 4.1 Mag 2.3 BUN/Creat 34/1.70 BNP 344 HS Trop 26 WBC 6/3 H/H 13/40 Plts 339  COVID neg  He reports that he was at work seated felt some palpitations and his life vest alarmed and stopped twice, a third time he puched the abort button and called 911   Past Medical History:  Diagnosis Date   Allergies    09/16/2020   BPH (benign prostatic hyperplasia)    ED (erectile dysfunction)    GERD (gastroesophageal reflux disease)    Peripheral focal chorioretinal inflammation of both eyes    Retinal vasculitis of both eyes     Past Surgical History:  Procedure Laterality Date   BRONCHIAL NEEDLE ASPIRATION BIOPSY  10/18/2020   Procedure: BRONCHIAL NEEDLE ASPIRATION BIOPSIES;  Surgeon: Josephine Igo, DO;  Location: [facility];  Service: Pulmonary;;   BRONCHIAL WASHINGS  10/18/2020   Procedure: BRONCHIAL WASHINGS;  Surgeon: Josephine Igo, DO;  Location: [facility];  Service: Pulmonary;;   RIGHT/LEFT HEART CATH AND CORONARY ANGIOGRAPHY N/A 10/16/2020   Procedure: RIGHT/LEFT HEART CATH AND CORONARY ANGIOGRAPHY;  Surgeon: Marykay Lex, MD;  Location: [facility] INVASIVE CV LAB;  Service: Cardiovascular;  Laterality: N/A;   VIDEO BRONCHOSCOPY WITH ENDOBRONCHIAL ULTRASOUND Bilateral 10/18/2020   Procedure: VIDEO BRONCHOSCOPY WITH ENDOBRONCHIAL ULTRASOUND;  Surgeon: Josephine Igo, DO;  Location: [facility];  Service: Pulmonary;  Laterality: Bilateral;     Home Medications:  Prior to Admission medications   Medication Sig Start Date End Date Taking? Authorizing Provider  aspirin 81 MG EC tablet Take 1 tablet (81 mg total) by mouth daily. Swallow whole. 10/22/20  Yes Calvert Cantor, MD  Calcium Carbonate-Vitamin D3 (CALCIUM 600+D) 600-400 MG-UNIT TABS Take 1 tablet by mouth 2 (two)  times daily. 10/21/20 10/21/21 Yes Calvert Cantor, MD  cetirizine (ZYRTEC) 10 MG tablet Take 10 mg by mouth daily.   Yes [provider]  dapagliflozin propanediol (FARXIGA) 10 MG TABS tablet Take 1 tablet (10 mg total) by mouth daily. 10/22/20  Yes Calvert Cantor, MD  digoxin (LANOXIN) 0.125 MG tablet Take 1 tablet (0.125 mg total) by mouth daily. 10/22/20  Yes Calvert Cantor, MD  feeding supplement (ENSURE ENLIVE / ENSURE PLUS) LIQD Take 237 mLs by mouth 2 (two) times daily between meals. 10/21/20  Yes Calvert Cantor, MD  folic acid (FOLVITE) 1 MG tablet Take 1 tablet (1 mg total) by mouth daily. 10/21/20 10/21/21 Yes Calvert Cantor, MD  hydrALAZINE (APRESOLINE) 25 MG tablet Take 1/2 tablet (12.5 mg total) by mouth 3 (three) times daily. 10/21/20 10/21/21 Yes Calvert Cantor, MD  isosorbide mononitrate (IMDUR) 30 MG 24 hr tablet Take 1/2 tablet (15 mg total) by mouth daily. 10/21/20 10/21/21 Yes Calvert Cantor, MD  methotrexate (RHEUMATREX) 2.5 MG tablet Take 4 tablets (10 mg total) by mouth every Wednesday. Caution:Chemotherapy. Protect from light. 10/23/20  Yes Calvert Cantor, MD  mexiletine (MEXITIL) 200 MG capsule Take 1 capsule (200 mg total) by mouth 2 (two) times daily. 10/21/20  Yes Calvert Cantor, MD  Multiple Vitamins-Minerals (CERTAVITE/ANTIOXIDANTS) TABS Take 1 tablet by mouth daily. 10/22/20  Yes Calvert Cantor, MD  omeprazole (PRILOSEC) 40 MG capsule Take 40 mg by mouth every morning. 08/30/20  Yes [provider]  predniSONE (DELTASONE) 20 MG tablet Take 2 tablets (40 mg total) by mouth daily with breakfast. 10/22/20  Yes Calvert Cantor, MD  sulfamethoxazole-trimethoprim (BACTRIM DS) 800-160 MG tablet Take 1 tablet by mouth every Monday, Wednesday, and Friday. 10/23/20  Yes Calvert Cantor, MD  tamsulosin (FLOMAX) 0.4 MG CAPS capsule Take 2 capsules (0.8 mg total) by mouth daily. 10/21/20  Yes Rizwan, Ladell Heads, MD  montelukast (SINGULAIR) 10 MG tablet Take 10 mg by mouth daily. Patient not taking:  Reported on 10/25/2020 08/30/20   [provider]    Inpatient Medications: Scheduled Meds:  calcium carbonate  1 tablet Oral BID   CertaVite/Antioxidants  1 tablet Oral Daily   cholecalciferol  400 Units Oral BID   enoxaparin (LOVENOX) injection  40 mg Subcutaneous Q24H   folic acid  1 mg Oral Daily   loratadine  10 mg Oral Daily   [START ON 10/30/2020] methotrexate  10 mg Oral Q Wed   midodrine  5 mg Oral TID WC   [START ON 10/26/2020] pantoprazole  40 mg Oral Daily   [START ON 10/26/2020] predniSONE  40 mg Oral Q breakfast   sodium chloride flush  3 mL Intravenous Q12H   sulfamethoxazole-trimethoprim  1 tablet Oral Q M,W,F   tamsulosin  0.8 mg Oral Daily   Continuous Infusions:  sodium chloride     amiodarone 60 mg/hr (10/25/20 1016)   Followed by   amiodarone     PRN Meds: sodium chloride, acetaminophen, ondansetron (ZOFRAN) IV, sodium chloride flush  Allergies:   No Known Allergies  Social History:   Social History   Socioeconomic History   Marital status: Married  Spouse name: Not on file   Number of children: Not on file   Years of education: Not on file   Highest education level: Not on file  Occupational History   Not on file  Tobacco Use   Smoking status: Former    Packs/day: 0.50    Types: Cigarettes    Quit date: 02/02/2010    Years since quitting: 10.7   Smokeless tobacco: Never  Substance and Sexual Activity   Alcohol use: Yes   Drug use: Not Currently   Sexual activity: Not on file  Other Topics Concern   Not on file  Social History Narrative   Not on file   Social Determinants of Health   Financial Resource Strain: Low Risk    Difficulty of Paying Living Expenses: Not very hard  Food Insecurity: No Food Insecurity   Worried About Running Out of Food in the Last Year: Never true   Ran Out of Food in the Last Year: Never true  Transportation Needs: No Transportation Needs   Lack of Transportation (Medical): No   Lack of  Transportation (Non-Medical): No  Physical Activity: Not on file  Stress: Not on file  Social Connections: Not on file  Intimate Partner Violence: Not on file    Family History:   Family History  Problem Relation Age of Onset   Cancer Father 72   Diabetes Father      ROS:  Please see the history of present illness.  All other ROS reviewed and negative.     Physical Exam/Data:   Vitals:   10/25/20 0955 10/25/20 0956 10/25/20 1000  BP: 119/86  101/76  Pulse: (!) 108  (!) 110  Resp: 20  15  Temp: 97.9 F (36.6 C)    TempSrc: Oral    SpO2: 96%  100%  Weight:  97.5 kg   Height:  5\' 10"  (1.778 m)     Intake/Output Summary (Last 24 hours) at 10/25/2020 1342 Last data filed at 10/25/2020 0953 Gross per 24 hour  Intake 150 ml  Output --  Net 150 ml   Last 3 Weights 10/25/2020 10/21/2020 10/20/2020  Weight (lbs) 215 lb 216 lb 4.3 oz 214 lb  Weight (kg) 97.523 kg 98.1 kg 97.07 kg     Body mass index is 30.85 kg/m.  General:  Well nourished, well developed, in no acute distress HEENT: normal Neck: no JVD Vascular: No carotid bruits; Distal pulses 2+ bilaterally Cardiac:  RRR; extrasystoles, 2/6SM, no gallops or rubs Lungs:  CTA b/l, no wheezing, rhonchi or rales  Abd: soft, nontender  Ext: no edema Musculoskeletal:  No deformities Skin: warm and dry  Neuro:  no focal abnormalities noted Psych:  Normal affect   EKG:  The EKG was personally reviewed and demonstrates:    ST 116bpm, bigeminy PVCs, 3 beat NSVT  Telemetry:  Telemetry was personally reviewed and demonstrates:   SR 80's, frequent PVCs, less frequent couplets and 3-6beats NSVT with decreasing burden   Relevant CV Studies:   10/16/20: LHC   Hemodynamic findings consistent with mild pulmonary hypertension.   Estimated moderate-severe MR with 3-4+ based on size of V wave on PCWP tracing.   Angiographically Normal Coronary Arteries   SUMMARY Angiographically normal coronary arteries Mild to moderate  Pulmonary Hypertension secondary to Mitral Regurgitation, and elevated LVEDP from left-sided failure. PAP 46/19 mm with a mean of 33 mmHg.  P CWP 27 mmHg and LVEDP 30 mmHg.  In the setting of systolic pressures 90/20 mmHg  Cardiac Output (Fick) 6.75-3.06.-Index SVR 10.96, PVR 0.9 (Wood units)     RECOMMENDATIONS Transfer to cardiac telemetry unit for continued management.   Was given 40 mg IV Lasix in the Cath Lab, defer further diuresis to cardiology service.   10/17/20: c.MRI FINDINGS: 1. Severe left ventricular dilation, with LVEDD 70 mm, but LVEDVi 142 mL/m2.   Normal left ventricular thickness, with intraventricular septal thickness of 7 mm, posterior wall thickness of 7 mm, and septal to posterior ratio < 1.5.   Severe left ventricular systolic dysfunction (LVEF = 18%). There is severe basal and mid anterior, anteroseptal, inferior, and lateral walls hypokinesis.   Left ventricular parametric mapping notable for slight elevation in the native T1 signal and normal T2 signal.   There is late gadolinium enhancement in the left ventricular myocardium: Patchy basal septal LGE, transmural basal inferolateral and anterolateral LGE with associated thinning that extends into the apex. There is inferior epicardial LGE in the base. Though this could be consistent with multi-vessel disease, lack of subendocardial involvement and patchy nature would favor sarcoidosis.   2. Normal right ventricular size with RVEDVI 58 mL/m2.   Normal right ventricular thickness.   Moderate right ventricular systolic dysfunction (RVEF =33%). There are no regional wall motion abnormalities or aneurysms.   3.  Normal left and right atrial size.   4. Normal size of the aortic root, ascending aorta and pulmonary artery.   5.  Qualitatively there is at least moderate mitral regurgitation.   6.  Normal pericardium.  No pericardial effusion.   7. There are bilateral effusions and regions within the  right pleural effusion that are isointense with myocardium. This is best seen posterior to the left atrium. Correlates to area on recent CTPE with Hounsfield units of 258. This may be consistent with patient's bulky lymphadenopathy with some associated atelectasis. Ultimately, would recommended dedicated study if additional imaging need for non-cardiac pathology.   8.  Breath hold artifact noted.   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 Non-cardiac findings discussed with primary and pulmonology teams.     Echo: 10/13/2020  1. Left ventricular ejection fraction, by estimation, is 30 to 35%. The  left ventricle has moderately decreased function. The left ventricle  demonstrates global hypokinesis. The left ventricular internal cavity size was severely dilated. Left ventricular diastolic parameters are indeterminate.   2. Right ventricular systolic function is normal. The right ventricular  size is normal. There is moderately elevated pulmonary artery systolic pressure.   3. Left atrial size was moderately dilated.   4. The mitral valve is normal in structure. Severe mitral valve  regurgitation. No evidence of mitral stenosis.   5. The aortic valve is tricuspid. Aortic valve regurgitation is trivial.  Mild aortic valve sclerosis is present, with no evidence of aortic valve stenosis.   6. The inferior vena cava is normal in size with greater than 50%  respiratory variability, suggesting right atrial pressure of 3 mmHg.   Laboratory Data:  High Sensitivity Troponin:   Recent Labs  Lab 10/13/20 0237 10/13/20 0524 10/14/20 0549 10/14/20 0820 10/25/20 0950  TROPONINIHS 154* 193* 129* 120* 26*     Chemistry Recent Labs  Lab 10/21/20 0251 10/22/20 0141 10/25/20 0950 10/25/20 0959  NA 132* 132* 134* 136  K 4.6 4.6 4.0 4.1  CL 99 101 100 101  CO2 24 22 25   --   GLUCOSE 105* 130* 109* 108*  BUN 27* 29* 30* 34*  CREATININE  1.56*  1.60* 1.66* 1.70*  CALCIUM 9.0 9.1 9.4  --   MG 2.4 2.4 2.3  --   GFRNONAA 53* 51* 49*  --   ANIONGAP 9 9 9   --     Recent Labs  Lab 10/25/20 0950  PROT 7.0  ALBUMIN 3.4*  AST 26  ALT 23  ALKPHOS 52  BILITOT 0.5   Lipids No results for input(s): CHOL, TRIG, HDL, LABVLDL, LDLCALC, CHOLHDL in the last 168 hours.  Hematology Recent Labs  Lab 10/25/20 0950 10/25/20 0959  WBC 6.3  --   RBC 4.59  --   HGB 12.6* 13.6  HCT 40.3 40.0  MCV 87.8  --   MCH 27.5  --   MCHC 31.3  --   RDW 15.4  --   PLT 339  --    Thyroid No results for input(s): TSH, FREET4 in the last 168 hours.  BNP Recent Labs  Lab 10/25/20 0948  BNP 344.2*    DDimer No results for input(s): DDIMER in the last 168 hours.   Radiology/Studies:  DG Chest Portable 1 View  Result Date: 10/25/2020 CLINICAL DATA:  Palpitations EXAM: PORTABLE CHEST 1 VIEW COMPARISON:  10/14/2020 FINDINGS: Pacer/defibrillator projecting over the left side of the chest. Prior median sternotomy. Midline trachea. Borderline cardiomegaly. Right paratracheal soft tissue fullness corresponds to mediastinal adenopathy on prior CT. Bilateral hilar adenopathy as well. No pleural effusion or pneumothorax.  Resolved airspace disease. IMPRESSION: Thoracic adenopathy, as on recent CT. No evidence of residual airspace disease. Electronically Signed   By: 12/14/2020 M.D.   On: 10/25/2020 10:39     Assessment and Plan:   Palpitations, life vest alarmed (aborted by patient) Frequent NSVTs in route with EMS and here Suspect he had sustained VT No syncope  I have spoken to [place] with [employer] who called to the ER No transmissions had been received and a rep will come and interrogate his life vest  Dr. South jordan has seen and examined the patient Continue amiodarone gtt On mexiletine out patient would stay with amiodarone for now Will see what his life vest showed, if he had sustained VT he will certainly need an ICD  Continue management  otherwise with AHF team   Risk Assessment/Risk Scores:     For questions or updates, please contact [facility] Please consult [email or web address] for contact info under    Signed, Lalla Brothers, PA-C  10/25/2020 1:42 PM

## 2020-10-25 NOTE — H&P (Addendum)
Advanced Heart Failure Team History and Physical Note   PCP:  Dois Davenport, MD  PCP-Cardiology: Chilton Si, MD     Reason for Admission: NSVT/PVCs    HPI:     [name] is a 55 y/o male with history of peripheral focal chorioretinal inflammation of both eyes diagnosed in 2020.  Followed at [facility] ophthalmology.  He was treated with methotrexate and folic acid but self discontinued after 6 months in 2021.  Admitted 10/12/20 with increased shortness of breath. CT chest with bulky mediastinal lymphadenopathy. Pulmonary consulted. Had bronchoscopy + biopsy. Path showed no malignancy. Echo showed EF 25 with reduced RV . Cath showed normal cors and stable hemodynamics. Had CMRI that showed EF 18% with LGE pattern consistent with cardiac sarcoidosis. GMIT limited by hypotension. He was on 1/2 tab bidil + farxiga. Placed on prednisone, metrotrexate, bactrim for PJP. Had NSVT and high PVC burden was placed on mexilitene 200 mg twice a day. Discharged with Life Vest due to frequent PVCs.  He took 1st dose on MTX 9/21  Earlier today he was at work and his Technical sales engineer alarmed at least 5 times. He reset the alarm then EMS called. Denied presyncope/syncope.  EMS EKG showed frequent NSVT.  Given 150 mg IV amio bolus and transported to [facility]. In ED having multiple runs of NSVT/VT. K 4.1 Creatinine 1.7.    Cardiac Testing  Echo: EF 25% dilated. RV moderately HK. Mod-severe MR Personally reviewed   Cath 10/16/20: No CAD PA 46/19 (33) PCWP 27 Fick 6.7/3.1   cMRI: EF 18% severely dilated LVIDd 7.0 cm  Review of Systems: [y] = yes, [ ]  = no   General: Weight gain [ ] ; Weight loss [ ] ; Anorexia [ ] ; Fatigue [ ] ; Fever [ ] ; Chills [ ] ; Weakness [ ]   Cardiac: Chest pain/pressure [ ] ; Resting SOB [ ] ; Exertional SOB [ Y]; Orthopnea [ ] ; Pedal Edema [ ] ; Palpitations [ ] ; Syncope [ ] ; Presyncope [ ] ; Paroxysmal nocturnal dyspnea[ ]   Pulmonary: Cough [ ] ; Wheezing[ ] ; Hemoptysis[ ] ;  Sputum [ ] ; Snoring [ ]   GI: Vomiting[ ] ; Dysphagia[ ] ; Melena[ ] ; Hematochezia [ ] ; Heartburn[ ] ; Abdominal pain [ ] ; Constipation [ ] ; Diarrhea [ ] ; BRBPR [ ]   GU: Hematuria[ ] ; Dysuria [ ] ; Nocturia[ ]   Vascular: Pain in legs with walking [ ] ; Pain in feet with lying flat [ ] ; Non-healing sores [ ] ; Stroke [ ] ; TIA [ ] ; Slurred speech [ ] ;  Neuro: Headaches[ ] ; Vertigo[ ] ; Seizures[ ] ; Paresthesias[ ] ;Blurred vision [ ] ; Diplopia [ ] ; Vision changes [ ]   Ortho/Skin: Arthritis [ ] ; Joint pain [ ] ; Muscle pain [ ] ; Joint swelling [ ] ; Back Pain [ ] ; Rash [ ]   Psych: Depression[ ] ; Anxiety[ ]   Heme: Bleeding problems [ ] ; Clotting disorders [ ] ; Anemia [ ]   Endocrine: Diabetes [ ] ; Thyroid dysfunction[ ]    Home Medications Prior to Admission medications   Medication Sig Start Date End Date Taking? Authorizing Provider  aspirin 81 MG EC tablet Take 1 tablet (81 mg total) by mouth daily. Swallow whole. 10/22/20   Calvert Cantor, MD  Calcium Carbonate-Vitamin D3 (CALCIUM 600+D) 600-400 MG-UNIT TABS Take 1 tablet by mouth 2 (two) times daily. 10/21/20 10/21/21  Calvert Cantor, MD  cetirizine (ZYRTEC) 10 MG tablet Take 10 mg by mouth daily.    [provider]  dapagliflozin propanediol (FARXIGA) 10 MG TABS tablet Take 1 tablet (10 mg  total) by mouth daily. 10/22/20   Calvert Cantor, MD  digoxin (LANOXIN) 0.125 MG tablet Take 1 tablet (0.125 mg total) by mouth daily. 10/22/20   Calvert Cantor, MD  feeding supplement (ENSURE ENLIVE / ENSURE PLUS) LIQD Take 237 mLs by mouth 2 (two) times daily between meals. 10/21/20   Calvert Cantor, MD  folic acid (FOLVITE) 1 MG tablet Take 1 tablet (1 mg total) by mouth daily. 10/21/20 10/21/21  Calvert Cantor, MD  hydrALAZINE (APRESOLINE) 25 MG tablet Take 1/2 tablet (12.5 mg total) by mouth 3 (three) times daily. 10/21/20 10/21/21  Calvert Cantor, MD  isosorbide mononitrate (IMDUR) 30 MG 24 hr tablet Take 1/2 tablet (15 mg total) by mouth daily. 10/21/20 10/21/21  Calvert Cantor, MD  methocarbamol (ROBAXIN) 500 MG tablet Take 1 tablet (500 mg total) by mouth 2 (two) times daily as needed for muscle spasms. Patient not taking: No sig reported 12/05/15   Ward, Chase Picket, PA-C  methotrexate (RHEUMATREX) 2.5 MG tablet Take 4 tablets (10 mg total) by mouth every Wednesday. Caution:Chemotherapy. Protect from light. 10/23/20   Calvert Cantor, MD  mexiletine (MEXITIL) 200 MG capsule Take 1 capsule (200 mg total) by mouth 2 (two) times daily. 10/21/20   Calvert Cantor, MD  montelukast (SINGULAIR) 10 MG tablet Take 10 mg by mouth daily. 08/30/20   [provider]  Multiple Vitamins-Minerals (CERTAVITE/ANTIOXIDANTS) TABS Take 1 tablet by mouth daily. 10/22/20   Calvert Cantor, MD  omeprazole (PRILOSEC) 40 MG capsule Take 40 mg by mouth every morning. 08/30/20   [provider]  predniSONE (DELTASONE) 20 MG tablet Take 2 tablets (40 mg total) by mouth daily with breakfast. 10/22/20   Calvert Cantor, MD  sulfamethoxazole-trimethoprim (BACTRIM DS) 800-160 MG tablet Take 1 tablet by mouth every Monday, Wednesday, and Friday. 10/23/20   Calvert Cantor, MD  tamsulosin (FLOMAX) 0.4 MG CAPS capsule Take 2 capsules (0.8 mg total) by mouth daily. 10/21/20   Calvert Cantor, MD    Past Medical History: Past Medical History:  Diagnosis Date   Allergies    09/16/2020   BPH (benign prostatic hyperplasia)    ED (erectile dysfunction)    GERD (gastroesophageal reflux disease)    Peripheral focal chorioretinal inflammation of both eyes    Retinal vasculitis of both eyes     Past Surgical History: Past Surgical History:  Procedure Laterality Date   BRONCHIAL NEEDLE ASPIRATION BIOPSY  10/18/2020   Procedure: BRONCHIAL NEEDLE ASPIRATION BIOPSIES;  Surgeon: Josephine Igo, DO;  Location: [facility] ENDOSCOPY;  Service: Pulmonary;;   BRONCHIAL WASHINGS  10/18/2020   Procedure: BRONCHIAL WASHINGS;  Surgeon: Josephine Igo, DO;  Location: [facility] ENDOSCOPY;  Service: Pulmonary;;   RIGHT/LEFT  HEART CATH AND CORONARY ANGIOGRAPHY N/A 10/16/2020   Procedure: RIGHT/LEFT HEART CATH AND CORONARY ANGIOGRAPHY;  Surgeon: Marykay Lex, MD;  Location: [facility] INVASIVE CV LAB;  Service: Cardiovascular;  Laterality: N/A;   VIDEO BRONCHOSCOPY WITH ENDOBRONCHIAL ULTRASOUND Bilateral 10/18/2020   Procedure: VIDEO BRONCHOSCOPY WITH ENDOBRONCHIAL ULTRASOUND;  Surgeon: Josephine Igo, DO;  Location: [facility] ENDOSCOPY;  Service: Pulmonary;  Laterality: Bilateral;    Family History:  Family History  Problem Relation Age of Onset   Cancer Father 45   Diabetes Father     Social History: Social History   Socioeconomic History   Marital status: Married    Spouse name: Not on file   Number of children: Not on file   Years of education: Not on file   Highest education level: Not on  file  Occupational History   Not on file  Tobacco Use   Smoking status: Former    Packs/day: 0.50    Types: Cigarettes    Quit date: 02/02/2010    Years since quitting: 10.7   Smokeless tobacco: Never  Substance and Sexual Activity   Alcohol use: Yes   Drug use: Not Currently   Sexual activity: Not on file  Other Topics Concern   Not on file  Social History Narrative   Not on file   Social Determinants of Health   Financial Resource Strain: Low Risk    Difficulty of Paying Living Expenses: Not very hard  Food Insecurity: No Food Insecurity   Worried About Running Out of Food in the Last Year: Never true   Ran Out of Food in the Last Year: Never true  Transportation Needs: No Transportation Needs   Lack of Transportation (Medical): No   Lack of Transportation (Non-Medical): No  Physical Activity: Not on file  Stress: Not on file  Social Connections: Not on file    Allergies:  No Known Allergies  Objective:    Vital Signs:   Temp:  [97.9 F (36.6 C)] 97.9 F (36.6 C) (09/23 0955) Pulse Rate:  [108-110] 110 (09/23 1000) Resp:  [15-20] 15 (09/23 1000) BP: (101-119)/(76-86) 101/76 (09/23 1000) SpO2:   [96 %-100 %] 100 % (09/23 1000) Weight:  [97.5 kg] 97.5 kg (09/23 0956)   Filed Weights   10/25/20 0956  Weight: 97.5 kg     Physical Exam    General: . No respiratory difficulty HEENT: Normal Neck: Supple. no JVD. Carotids 2+ bilat; no bruits. No lymphadenopathy or thyromegaly appreciated. Cor: PMI nondisplaced. Regular rate & rhythm. No rubs, gallops or murmurs. Zoll Pads in place. Lungs: Clear Abdomen: Soft, nontender, nondistended. No hepatosplenomegaly. No bruits or masses. Good bowel sounds. Extremities: No cyanosis, clubbing, rash, edema Neuro: Alert & oriented x 3, cranial nerves grossly intact. moves all 4 extremities w/o difficulty. Affect pleasant.   Telemetry  ST with frequent runs of NSVT/PVCs  EKG   ST NSVT 116 bpm   Labs     Basic Metabolic Panel: Recent Labs  Lab 10/19/20 0157 10/19/20 1912 10/20/20 0138 10/21/20 0251 10/22/20 0141 10/25/20 0959  NA 134* 134* 135 132* 132* 136  K 5.7* 4.5 4.1 4.6 4.6 4.1  CL 102 103 103 99 101 101  CO2 24 25 24 24 22   --   GLUCOSE 123* 141* 133* 105* 130* 108*  BUN 27* 25* 24* 27* 29* 34*  CREATININE 1.72* 1.52* 1.48* 1.56* 1.60* 1.70*  CALCIUM 9.2 9.0 9.1 9.0 9.1  --   MG  --   --  2.3 2.4 2.4  --     Liver Function Tests: No results for input(s): AST, ALT, ALKPHOS, BILITOT, PROT, ALBUMIN in the last 168 hours. No results for input(s): LIPASE, AMYLASE in the last 168 hours. No results for input(s): AMMONIA in the last 168 hours.  CBC: Recent Labs  Lab 10/25/20 0959  HGB 13.6  HCT 40.0    Cardiac Enzymes: No results for input(s): CKTOTAL, CKMB, CKMBINDEX, TROPONINI in the last 168 hours.  BNP: BNP (last 3 results) Recent Labs    10/12/20 1253  BNP 558.3*    ProBNP (last 3 results) No results for input(s): PROBNP in the last 8760 hours.   CBG: No results for input(s): GLUCAP in the last 168 hours.  Coagulation Studies: No results for input(s): LABPROT, INR in the last 72  hours.  Imaging: No results found.   Patient Profile   [name] is a 55 y/o male with history of peripheral focal chorioretinal inflammation of both eyes diagnosed in 2020.  Followed at [facility] ophthalmology.  He was treated with methotrexate and folic acid but self discontinued after 6 months in 2021. 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   Assessment/Plan  NSVT/ Frequent PVCs  Life Vest prior to admit. Multiple alarms today which he reset. EMS and in ED with frequent runs of NSVT/PVC. Well over 25% burden. He has been on mexilitene 200 mg twice a day.  Keep K >4 and Mag >2 K stable.  - Will need to start amio drip.  - Consult EP. ? Device.   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 As above EP requested.   3. 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 Volume status stable. Does not need diuretics/  -Hypotensive with amio drip. Start  midodrine 5 mg three times a day - Will need to hold bidil and farxiga for now.  - Check dig level. If ok can restart digoxin 0.125 mg daily  - See above for sarcoid treatment   4. CKD Stage  -Creatinine baseline 1.4-1.6 - Todays creatinine 1.7 on istat.  As above holding farxiga  5. Mediastinal Lymphadenopathy 10/17/20 bronchoscopy + biopsy. Path showed no malignancy  Consult EP for AA ? Needs ICD  Waiting on additional labs.  Tonye Becket, NP 10/25/2020, 10:38 AM  Advanced Heart Failure Team Pager [phone] (M-F; 7a - 5p)  Please contact [facility] for night-coverage after hours (4p -7a ) and weekends on [email or web address]  Patient seen with NP, agree with the above note.   Patient with history  as above.  Strong suspicion based on clinical picture and cardiac MRI that he has cardiac sarcoidosis. CT chest with bulky mediastinal lymphadenopathy but path did not show sarcoidosis. Sent home with Lifevest due to PVCs and NSVT. Sent home on mexiletine. He was started on prednisone and MTX.   Patient returns to ER after getting alarms on Lifevest. He was able to disarm the Lifevest to avoid getting shocked.  In the ER, he is noted to have frequent NSVT runs with very frequent PVCs.    Otherwise, has not had significant dyspnea.   General: NAD Neck: No JVD, no thyromegaly or thyroid nodule.  Lungs: Clear to auscultation bilaterally with normal respiratory effort. CV: Nondisplaced PMI.  Heart irregular S1/S2 (PVCs), no S3/S4, no murmur.  No peripheral edema.   Abdomen: Soft, nontender, no hepatosplenomegaly, no distention.  Skin: Intact without lesions or rashes.  Neurologic: Alert and oriented x 3.  Psych: Normal affect. Extremities: No clubbing or cyanosis.  HEENT: Normal.   Very frequent PVCs/NSVT in setting of strongly suspected cardiac sarcoidosis with LV EF 18% by MRI despite mexiletine use.  - Start amiodarone gtt.  - Will ask EP to see him.  With VT runs, would favor ICD placement for secondary prevention.  Baseline QRS is narrow, not CRT candidate (does not appear to have significant heart block).   Continue prednisone/MTX as well as bactrim PJP prophylaxis.  Will need to work in a cardiac PET at some point at least to follow response to treatment.   Marca Ancona 10/25/2020 2:29 PM

## 2020-10-25 NOTE — Progress Notes (Signed)
   10/25/20 1808  Vitals  Temp (!) 97.1 F (36.2 C)  Temp Source Oral  BP 93/68  MAP (mmHg) 76  BP Location Right Arm  BP Method Automatic  Patient Position (if appropriate) Lying  Pulse Rate (!) 106  Pulse Rate Source Monitor  ECG Heart Rate (!) 106  Resp 16  MEWS COLOR  MEWS Score Color Yellow  Oxygen Therapy  SpO2 94 %  O2 Device Room Air  Pain Assessment  Pain Scale 0-10  Pain Score 0  MEWS Score  MEWS Temp 0  MEWS Systolic 1  MEWS Pulse 1  MEWS RR 0  MEWS LOC 0  MEWS Score 2   Patient HR has been 90-115s in the ED. MD is aware. Patient is asymptomatic and feels fine with no chest pain. RN will continue to monitor.

## 2020-10-26 ENCOUNTER — Encounter (HOSPITAL_COMMUNITY): Payer: Self-pay | Admitting: Cardiology

## 2020-10-26 ENCOUNTER — Other Ambulatory Visit: Payer: Self-pay

## 2020-10-26 DIAGNOSIS — I472 Ventricular tachycardia: Secondary | ICD-10-CM | POA: Diagnosis not present

## 2020-10-26 DIAGNOSIS — I5022 Chronic systolic (congestive) heart failure: Secondary | ICD-10-CM

## 2020-10-26 DIAGNOSIS — D8685 Sarcoid myocarditis: Secondary | ICD-10-CM | POA: Diagnosis not present

## 2020-10-26 LAB — BASIC METABOLIC PANEL
Anion gap: 6 (ref 5–15)
BUN: 26 mg/dL — ABNORMAL HIGH (ref 6–20)
CO2: 27 mmol/L (ref 22–32)
Calcium: 8.9 mg/dL (ref 8.9–10.3)
Chloride: 100 mmol/L (ref 98–111)
Creatinine, Ser: 1.83 mg/dL — ABNORMAL HIGH (ref 0.61–1.24)
GFR, Estimated: 44 mL/min — ABNORMAL LOW (ref >60)
Glucose, Bld: 95 mg/dL (ref 70–99)
Potassium: 4.8 mmol/L (ref 3.5–5.1)
Sodium: 133 mmol/L — ABNORMAL LOW (ref 135–145)

## 2020-10-26 MED ORDER — DIGOXIN 125 MCG PO TABS
0.1250 mg | ORAL_TABLET | Freq: Every day | ORAL | Status: DC
  Administered 2020-10-26 – 2020-10-29 (×4): 0.125 mg via ORAL
  Filled 2020-10-26 (×4): qty 1

## 2020-10-26 NOTE — Progress Notes (Signed)
Advanced Heart Failure Rounding Note   Subjective:    Remains on IV amio. Continue with frequent PVCs and brief runs of NSVT but overall burden decreased.   Denies SOB, orthopnea or PND.     Objective:   Weight Range:  Vital Signs:   Temp:  [97.1 F (36.2 C)-98.2 F (36.8 C)] 98.2 F (36.8 C) (09/24 0830) Pulse Rate:  [25-109] 98 (09/24 1200) Resp:  [14-31] 17 (09/24 1017) BP: (92-111)/(52-86) 94/67 (09/24 1200) SpO2:  [89 %-100 %] 99 % (09/24 1400) Weight:  [97 kg-97.3 kg] 97 kg (09/24 0057) Last BM Date: 10/25/20  Weight change: Filed Weights   10/25/20 0956 10/25/20 1800 10/26/20 0057  Weight: 97.5 kg 97.3 kg 97 kg    Intake/Output:   Intake/Output Summary (Last 24 hours) at 10/26/2020 1424 Last data filed at 10/26/2020 0834 Gross per 24 hour  Intake 738.86 ml  Output 1300 ml  Net -561.14 ml     Physical Exam: General:  Sitting up in bed No resp difficulty HEENT: normal Neck: supple. JVP flat. Carotids 2+ bilat; no bruits. No lymphadenopathy or thryomegaly appreciated. Cor: PMI nondisplaced. Irregular with frequent ectopyNo rubs, gallops or murmurs. Lungs: clear Abdomen: soft, nontender, nondistended. No hepatosplenomegaly. No bruits or masses. Good bowel sounds. Extremities: no cyanosis, clubbing, rash, edema Neuro: alert & orientedx3, cranial nerves grossly intact. moves all 4 extremities w/o difficulty. Affect pleasant  Telemetry: Sinus 80-90 with frequent PVCs and brief runs NSVT Personally reviewed   Labs: Basic Metabolic Panel: Recent Labs  Lab 10/20/20 0138 10/21/20 0251 10/22/20 0141 10/25/20 0950 10/25/20 0959 10/26/20 0359  NA 135 132* 132* 134* 136 133*  K 4.1 4.6 4.6 4.0 4.1 4.8  CL 103 99 101 100 101 100  CO2 24 24 22 25   --  27  GLUCOSE 133* 105* 130* 109* 108* 95  BUN 24* 27* 29* 30* 34* 26*  CREATININE 1.48* 1.56* 1.60* 1.66* 1.70* 1.83*  CALCIUM 9.1 9.0 9.1 9.4  --  8.9  MG 2.3 2.4 2.4 2.3  --   --     Liver Function  Tests: Recent Labs  Lab 10/25/20 0950  AST 26  ALT 23  ALKPHOS 52  BILITOT 0.5  PROT 7.0  ALBUMIN 3.4*   No results for input(s): LIPASE, AMYLASE in the last 168 hours. No results for input(s): AMMONIA in the last 168 hours.  CBC: Recent Labs  Lab 10/25/20 0950 10/25/20 0959  WBC 6.3  --   NEUTROABS 4.2  --   HGB 12.6* 13.6  HCT 40.3 40.0  MCV 87.8  --   PLT 339  --     Cardiac Enzymes: No results for input(s): CKTOTAL, CKMB, CKMBINDEX, TROPONINI in the last 168 hours.  BNP: BNP (last 3 results) Recent Labs    10/12/20 1253 10/25/20 0948  BNP 558.3* 344.2*    ProBNP (last 3 results) No results for input(s): PROBNP in the last 8760 hours.    Other results:  Imaging: DG Chest Portable 1 View  Result Date: 10/25/2020 CLINICAL DATA:  Palpitations EXAM: PORTABLE CHEST 1 VIEW COMPARISON:  10/14/2020 FINDINGS: Pacer/defibrillator projecting over the left side of the chest. Prior median sternotomy. Midline trachea. Borderline cardiomegaly. Right paratracheal soft tissue fullness corresponds to mediastinal adenopathy on prior CT. Bilateral hilar adenopathy as well. No pleural effusion or pneumothorax.  Resolved airspace disease. IMPRESSION: Thoracic adenopathy, as on recent CT. No evidence of residual airspace disease. Electronically Signed   By: 12/14/2020.D.  On: 10/25/2020 10:39     Medications:     Scheduled Medications:  calcium carbonate  1 tablet Oral BID   cholecalciferol  400 Units Oral BID   enoxaparin (LOVENOX) injection  40 mg Subcutaneous Q24H   folic acid  1 mg Oral Daily   loratadine  10 mg Oral Daily   [START ON 10/30/2020] methotrexate  10 mg Oral Q Wed   midodrine  5 mg Oral TID WC   multivitamin with minerals  1 tablet Oral Daily   pantoprazole  40 mg Oral Daily   predniSONE  40 mg Oral Q breakfast   sodium chloride flush  3 mL Intravenous Q12H   sulfamethoxazole-trimethoprim  1 tablet Oral Q M,W,F   tamsulosin  0.8 mg Oral Daily     Infusions:  sodium chloride     amiodarone 30 mg/hr (10/26/20 1102)    PRN Medications: sodium chloride, acetaminophen, ondansetron (ZOFRAN) IV, sodium chloride flush   Assessment/Plan:   NSVT/ Frequent PVCs  0 On admit wearing LifeVest. Frequent runs of NSVT/PVC. Well over 25% burden. He has been on mexilitene 200 mg twice a day.  - Ectopy reduced but still prominent on IV amio. 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 On folic acid - Continue bactrim DS M-W-F for PJP prophylaxis - Concerned about arrhythmias with cardiac sarcoid. As above EP requested.    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 Volume status stable. Does not need diuretics/ - Developed hypotension with amio drip. SBP remains in the 90-100 range with midodrine 5 tid -  Continue to hold bidil and farxiga for now.  - Dig level 0.6. Restart digoxin 0.125 mg daily  - See above for sarcoid treatment    4. CKD Stage  -Creatinine baseline 1.4-1.6 - Todays creatinine 1.8 Holding diuretics   5. Mediastinal Lymphadenopathy - 10/17/20 bronchoscopy + biopsy. Path showed no malignancy or evidence of sarcoid   Length of Stay: 1   Arvilla Meres MD 10/26/2020, 2:24 PM  Advanced Heart Failure Team Pager [phone] (M-F; 7a - 4p)  Please contact [facility] for night-coverage after hours (4p -7a ) and weekends on [email or web address]

## 2020-10-26 NOTE — Progress Notes (Signed)
EP Progress Note  Patient Name: Nathan [name] Date of Encounter: 10/26/2020  [facility] Cardiologist: Nathan Si, MD   Subjective   NAEO. Less ectopy over night. Spoke with patient and wife (on speaker phone) this AM.  Inpatient Medications    Scheduled Meds:  calcium carbonate  1 tablet Oral BID   cholecalciferol  400 Units Oral BID   enoxaparin (LOVENOX) injection  40 mg Subcutaneous Q24H   folic acid  1 mg Oral Daily   loratadine  10 mg Oral Daily   [START ON 10/30/2020] methotrexate  10 mg Oral Q Wed   midodrine  5 mg Oral TID WC   multivitamin with minerals  1 tablet Oral Daily   pantoprazole  40 mg Oral Daily   predniSONE  40 mg Oral Q breakfast   sodium chloride flush  3 mL Intravenous Q12H   sulfamethoxazole-trimethoprim  1 tablet Oral Q M,W,F   tamsulosin  0.8 mg Oral Daily   Continuous Infusions:  sodium chloride     amiodarone 30 mg/hr (10/25/20 2317)   PRN Meds: sodium chloride, acetaminophen, ondansetron (ZOFRAN) IV, sodium chloride flush   Vital Signs    Vitals:   10/26/20 0057 10/26/20 0109 10/26/20 0429 10/26/20 0830  BP:  (!) 92/58 94/70 (!) 100/52  Pulse:  95 95 (!) 104  Resp:  14 14 18   Temp:  97.8 F (36.6 C) 98.1 F (36.7 C) 98.2 F (36.8 C)  TempSrc:  Oral Oral Oral  SpO2:   100% 100%  Weight: 97 kg     Height:        Intake/Output Summary (Last 24 hours) at 10/26/2020 0856 Last data filed at 10/26/2020 0834 Gross per 24 hour  Intake 888.86 ml  Output 1300 ml  Net -411.14 ml   Last 3 Weights 10/26/2020 10/25/2020 10/25/2020  Weight (lbs) 213 lb 14.4 oz 214 lb 8.1 oz 215 lb  Weight (kg) 97.024 kg 97.3 kg 97.523 kg      Telemetry    Sinus with frequent PVCs - Personally Reviewed  ECG    No new - Personally Reviewed  Physical Exam   GEN: No acute distress.   Neck: No JVD Cardiac: RRR, no murmurs, rubs, or gallops.  Respiratory: Clear to auscultation bilaterally. GI: Soft, nontender, non-distended  MS: No  edema; No deformity. Neuro:  Nonfocal  Psych: Normal affect   Labs    High Sensitivity Troponin:   Recent Labs  Lab 10/13/20 0524 10/14/20 0549 10/14/20 0820 10/25/20 0950 10/25/20 1218  TROPONINIHS 193* 129* 120* 26* 29*     Chemistry Recent Labs  Lab 10/21/20 0251 10/22/20 0141 10/25/20 0950 10/25/20 0959 10/26/20 0359  NA 132* 132* 134* 136 133*  K 4.6 4.6 4.0 4.1 4.8  CL 99 101 100 101 100  CO2 24 22 25   --  27  GLUCOSE 105* 130* 109* 108* 95  BUN 27* 29* 30* 34* 26*  CREATININE 1.56* 1.60* 1.66* 1.70* 1.83*  CALCIUM 9.0 9.1 9.4  --  8.9  MG 2.4 2.4 2.3  --   --   PROT  --   --  7.0  --   --   ALBUMIN  --   --  3.4*  --   --   AST  --   --  26  --   --   ALT  --   --  23  --   --   ALKPHOS  --   --  52  --   --  BILITOT  --   --  0.5  --   --   GFRNONAA 53* 51* 49*  --  44*  ANIONGAP 9 9 9   --  6    Lipids No results for input(s): CHOL, TRIG, HDL, LABVLDL, LDLCALC, CHOLHDL in the last 168 hours.  Hematology Recent Labs  Lab 10/25/20 0950 10/25/20 0959  WBC 6.3  --   RBC 4.59  --   HGB 12.6* 13.6  HCT 40.3 40.0  MCV 87.8  --   MCH 27.5  --   MCHC 31.3  --   RDW 15.4  --   PLT 339  --    Thyroid No results for input(s): TSH, FREET4 in the last 168 hours.  BNP Recent Labs  Lab 10/25/20 0948  BNP 344.2*    DDimer No results for input(s): DDIMER in the last 168 hours.   Radiology    DG Chest Portable 1 View  Result Date: 10/25/2020 CLINICAL DATA:  Palpitations EXAM: PORTABLE CHEST 1 VIEW COMPARISON:  10/14/2020 FINDINGS: Pacer/defibrillator projecting over the left side of the chest. Prior median sternotomy. Midline trachea. Borderline cardiomegaly. Right paratracheal soft tissue fullness corresponds to mediastinal adenopathy on prior CT. Bilateral hilar adenopathy as well. No pleural effusion or pneumothorax.  Resolved airspace disease. IMPRESSION: Thoracic adenopathy, as on recent CT. No evidence of residual airspace disease. Electronically  Signed   By: 12/14/2020 M.D.   On: 10/25/2020 10:39    Cardiac Studies   10/17/2020 Cardiac MRI FINDINGS: 1. Severe left ventricular dilation, with LVEDD 70 mm, but LVEDVi 142 mL/m2. Normal left ventricular thickness, with intraventricular septal thickness of 7 mm, posterior wall thickness of 7 mm, and septal to posterior ratio < 1.5. Severe left ventricular systolic dysfunction (LVEF = 18%). There is severe basal and mid anterior, anteroseptal, inferior, and lateral walls hypokinesis. Left ventricular parametric mapping notable for slight elevation in the native T1 signal and normal T2 signal. There is late gadolinium enhancement in the left ventricular myocardium: Patchy basal septal LGE, transmural basal inferolateral and anterolateral LGE with associated thinning that extends into the apex. There is inferior epicardial LGE in the base. Though this could be consistent with multi-vessel disease, lack of subendocardial involvement and patchy nature would favor sarcoidosis. 2. Normal right ventricular size with RVEDVI 58 mL/m2. Normal right ventricular thickness. Moderate right ventricular systolic dysfunction (RVEF =33%). There are no regional wall motion abnormalities or aneurysms. 3.  Normal left and right atrial size. 4. Normal size of the aortic root, ascending aorta and pulmonary artery. 5.  Qualitatively there is at least moderate mitral regurgitation. 6.  Normal pericardium.  No pericardial effusion. 7. There are bilateral effusions and regions within the right pleural effusion that are isointense with myocardium. This is best seen posterior to the left atrium. Correlates to area on recent CTPE with Hounsfield units of 258. This may be consistent with patient's bulky lymphadenopathy with some associated atelectasis. Ultimately, would recommended dedicated study if additional imaging need for non-cardiac pathology. 8.  Breath hold artifact noted.   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 There is extensive LGE and severely decreased LV EF. He is having frequent ventricular ectopy that has improved with antiarrhythmic therapy. Given his low EF, extensive LGE, he will require an ICD.  #Cardiac Sarcoid Given his extensive transmural LGE and severely decreased LVEF, I favor implanting an ICD for primary prevention.  - continue amiodarone over the weekend, plan to transition  to oral amio prior to discharge - plan for ICD Monday - immunosuppression per HF team  The patient has an non-ischemic CM (EF 18%), NYHA Class III CHF, and cardiac sarcoidosis.  He is referred by Dr Shirlee Latch for risk stratification of sudden death and consideration of ICD implantation.  At this time, he meets criteria for ICD implantation for primary prevention of sudden death.  I have had a thorough discussion with the patient reviewing options.  The patient and their family (if available) have had opportunities to ask questions and have them answered. The patient and I have decided together through a shared decision making process to proceed with ICD at this time. Plan for Bi[employer] VDI ICD.   Risks, benefits, alternatives to ICD implantation were discussed in detail with the patient today. The patient understands that the risks include but are not limited to bleeding, infection, pneumothorax, perforation, tamponade, vascular damage, renal failure, MI, stroke, death, inappropriate shocks, and lead dislodgement and wishes to proceed.    Please keep NPO Sunday night.  For questions or updates, please contact [facility] Please consult [email or web address] for contact info under        Signed, Lanier Prude, MD  10/26/2020, 8:56 AM

## 2020-10-27 ENCOUNTER — Inpatient Hospital Stay (HOSPITAL_COMMUNITY): Payer: 59

## 2020-10-27 DIAGNOSIS — D8685 Sarcoid myocarditis: Secondary | ICD-10-CM | POA: Diagnosis not present

## 2020-10-27 DIAGNOSIS — I472 Ventricular tachycardia: Secondary | ICD-10-CM | POA: Diagnosis not present

## 2020-10-27 DIAGNOSIS — I5022 Chronic systolic (congestive) heart failure: Secondary | ICD-10-CM | POA: Diagnosis not present

## 2020-10-27 LAB — BASIC METABOLIC PANEL
Anion gap: 8 (ref 5–15)
BUN: 22 mg/dL — ABNORMAL HIGH (ref 6–20)
CO2: 23 mmol/L (ref 22–32)
Calcium: 8.9 mg/dL (ref 8.9–10.3)
Chloride: 103 mmol/L (ref 98–111)
Creatinine, Ser: 1.59 mg/dL — ABNORMAL HIGH (ref 0.61–1.24)
GFR, Estimated: 52 mL/min — ABNORMAL LOW (ref >60)
Glucose, Bld: 112 mg/dL — ABNORMAL HIGH (ref 70–99)
Potassium: 4.6 mmol/L (ref 3.5–5.1)
Sodium: 134 mmol/L — ABNORMAL LOW (ref 135–145)

## 2020-10-27 NOTE — Progress Notes (Signed)
EP Progress Note  Patient Name: Nathan [name] Date of Encounter: 10/27/2020  [facility] Cardiologist: Nathan Si, MD   Subjective   NAEO. Doing well this AM.  Inpatient Medications    Scheduled Meds:  calcium carbonate  1 tablet Oral BID   cholecalciferol  400 Units Oral BID   digoxin  0.125 mg Oral Daily   enoxaparin (LOVENOX) injection  40 mg Subcutaneous Q24H   folic acid  1 mg Oral Daily   loratadine  10 mg Oral Daily   [START ON 10/30/2020] methotrexate  10 mg Oral Q Wed   midodrine  5 mg Oral TID WC   multivitamin with minerals  1 tablet Oral Daily   pantoprazole  40 mg Oral Daily   predniSONE  40 mg Oral Q breakfast   sodium chloride flush  3 mL Intravenous Q12H   sulfamethoxazole-trimethoprim  1 tablet Oral Q M,W,F   tamsulosin  0.8 mg Oral Daily   Continuous Infusions:  sodium chloride     amiodarone 30 mg/hr (10/27/20 0731)   PRN Meds: sodium chloride, acetaminophen, ondansetron (ZOFRAN) IV, sodium chloride flush   Vital Signs    Vitals:   10/26/20 1400 10/26/20 2000 10/27/20 0000 10/27/20 0400  BP:  103/68 103/74 95/76  Pulse:  94 86 81  Resp:  18 20 20   Temp:  98.5 F (36.9 C) 97.6 F (36.4 C) 98.5 F (36.9 C)  TempSrc:  Oral Oral Oral  SpO2: 99% 100% 98% 100%  Weight:    96.3 kg  Height:        Intake/Output Summary (Last 24 hours) at 10/27/2020 0806 Last data filed at 10/27/2020 0731 Gross per 24 hour  Intake 1138.35 ml  Output 2395 ml  Net -1256.65 ml    Last 3 Weights 10/27/2020 10/26/2020 10/25/2020  Weight (lbs) 212 lb 4.9 oz 213 lb 14.4 oz 214 lb 8.1 oz  Weight (kg) 96.3 kg 97.024 kg 97.3 kg      Telemetry    Sinus with less frequent PVCs - Personally Reviewed  ECG    No new - Personally Reviewed  Physical Exam   GEN: No acute distress.   Neck: No JVD Cardiac: RRR, no murmurs, rubs, or gallops.  Respiratory: Clear to auscultation bilaterally. GI: Soft, nontender, non-distended  MS: No edema; No  deformity. Neuro:  Nonfocal  Psych: Normal affect   Labs    High Sensitivity Troponin:   Recent Labs  Lab 10/13/20 0524 10/14/20 0549 10/14/20 0820 10/25/20 0950 10/25/20 1218  TROPONINIHS 193* 129* 120* 26* 29*      Chemistry Recent Labs  Lab 10/21/20 0251 10/22/20 0141 10/25/20 0950 10/25/20 0959 10/26/20 0359 10/27/20 0241  NA 132* 132* 134* 136 133* 134*  K 4.6 4.6 4.0 4.1 4.8 4.6  CL 99 101 100 101 100 103  CO2 24 22 25   --  27 23  GLUCOSE 105* 130* 109* 108* 95 112*  BUN 27* 29* 30* 34* 26* 22*  CREATININE 1.56* 1.60* 1.66* 1.70* 1.83* 1.59*  CALCIUM 9.0 9.1 9.4  --  8.9 8.9  MG 2.4 2.4 2.3  --   --   --   PROT  --   --  7.0  --   --   --   ALBUMIN  --   --  3.4*  --   --   --   AST  --   --  26  --   --   --   ALT  --   --  23  --   --   --   ALKPHOS  --   --  52  --   --   --   BILITOT  --   --  0.5  --   --   --   GFRNONAA 53* 51* 49*  --  44* 52*  ANIONGAP 9 9 9   --  6 8     Lipids No results for input(s): CHOL, TRIG, HDL, LABVLDL, LDLCALC, CHOLHDL in the last 168 hours.  Hematology Recent Labs  Lab 10/25/20 0950 10/25/20 0959  WBC 6.3  --   RBC 4.59  --   HGB 12.6* 13.6  HCT 40.3 40.0  MCV 87.8  --   MCH 27.5  --   MCHC 31.3  --   RDW 15.4  --   PLT 339  --     Thyroid No results for input(s): TSH, FREET4 in the last 168 hours.  BNP Recent Labs  Lab 10/25/20 0948  BNP 344.2*     DDimer No results for input(s): DDIMER in the last 168 hours.   Radiology    DG Chest Portable 1 View  Result Date: 10/25/2020 CLINICAL DATA:  Palpitations EXAM: PORTABLE CHEST 1 VIEW COMPARISON:  10/14/2020 FINDINGS: Pacer/defibrillator projecting over the left side of the chest. Prior median sternotomy. Midline trachea. Borderline cardiomegaly. Right paratracheal soft tissue fullness corresponds to mediastinal adenopathy on prior CT. Bilateral hilar adenopathy as well. No pleural effusion or pneumothorax.  Resolved airspace disease. IMPRESSION: Thoracic  adenopathy, as on recent CT. No evidence of residual airspace disease. Electronically Signed   By: 12/14/2020 M.D.   On: 10/25/2020 10:39    Cardiac Studies   10/17/2020 Cardiac MRI FINDINGS: 1. Severe left ventricular dilation, with LVEDD 70 mm, but LVEDVi 142 mL/m2. Normal left ventricular thickness, with intraventricular septal thickness of 7 mm, posterior wall thickness of 7 mm, and septal to posterior ratio < 1.5. Severe left ventricular systolic dysfunction (LVEF = 18%). There is severe basal and mid anterior, anteroseptal, inferior, and lateral walls hypokinesis. Left ventricular parametric mapping notable for slight elevation in the native T1 signal and normal T2 signal. There is late gadolinium enhancement in the left ventricular myocardium: Patchy basal septal LGE, transmural basal inferolateral and anterolateral LGE with associated thinning that extends into the apex. There is inferior epicardial LGE in the base. Though this could be consistent with multi-vessel disease, lack of subendocardial involvement and patchy nature would favor sarcoidosis. 2. Normal right ventricular size with RVEDVI 58 mL/m2. Normal right ventricular thickness. Moderate right ventricular systolic dysfunction (RVEF =33%). There are no regional wall motion abnormalities or aneurysms. 3.  Normal left and right atrial size. 4. Normal size of the aortic root, ascending aorta and pulmonary artery. 5.  Qualitatively there is at least moderate mitral regurgitation. 6.  Normal pericardium.  No pericardial effusion. 7. There are bilateral effusions and regions within the right pleural effusion that are isointense with myocardium. This is best seen posterior to the left atrium. Correlates to area on recent CTPE with Hounsfield units of 258. This may be consistent with patient's bulky lymphadenopathy with some associated atelectasis. Ultimately, would recommended dedicated study if additional imaging  need for non-cardiac pathology. 8.  Breath hold artifact noted.   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 There is extensive LGE and severely decreased LV EF. He is having frequent ventricular ectopy that has improved with antiarrhythmic therapy. Given his low EF,  extensive LGE, he will require an ICD.  #Cardiac Sarcoid Given his extensive transmural LGE and severely decreased LVEF, I favor implanting an ICD for primary prevention.  - continue amiodarone over the weekend, plan to transition to oral amio prior to discharge - plan for ICD Monday - immunosuppression per HF team  The patient has an non-ischemic CM (EF 18%), NYHA Class III CHF, and cardiac sarcoidosis.  He is referred by Dr Shirlee Latch for risk stratification of sudden death and consideration of ICD implantation.  At this time, he meets criteria for ICD implantation for primary prevention of sudden death.  I have had a thorough discussion with the patient reviewing options.  The patient and their family (if available) have had opportunities to ask questions and have them answered. The patient and I have decided together through a shared decision making process to proceed with ICD at this time. Plan for [employer] VDI ICD.   Risks, benefits, alternatives to ICD implantation were discussed in detail with the patient today. The patient understands that the risks include but are not limited to bleeding, infection, pneumothorax, perforation, tamponade, vascular damage, renal failure, MI, stroke, death, inappropriate shocks, and lead dislodgement and wishes to proceed.    Please keep NPO Sunday night.  For questions or updates, please contact [facility] Please consult [email or web address] for contact info under        Signed, Lanier Prude, MD  10/27/2020, 8:06 AM

## 2020-10-27 NOTE — Progress Notes (Signed)
Advanced Heart Failure Rounding Note   Subjective:    Remains on IV amio at 30. Continues with frequent PVCs. No further NSVT  Denies CP, SOB, orthopnea or PND    Objective:   Weight Range:  Vital Signs:   Temp:  [97.3 F (36.3 C)-98.5 F (36.9 C)] 97.3 F (36.3 C) (09/25 0821) Pulse Rate:  [80-98] 80 (09/25 0821) Resp:  [18-21] 21 (09/25 0821) BP: (94-103)/(67-76) 98/72 (09/25 0821) SpO2:  [98 %-100 %] 100 % (09/25 0821) Weight:  [96.3 kg] 96.3 kg (09/25 0400) Last BM Date: 10/25/20  Weight change: Filed Weights   10/25/20 1800 10/26/20 0057 10/27/20 0400  Weight: 97.3 kg 97 kg 96.3 kg    Intake/Output:   Intake/Output Summary (Last 24 hours) at 10/27/2020 1058 Last data filed at 10/27/2020 0800 Gross per 24 hour  Intake 1378.35 ml  Output 2245 ml  Net -866.65 ml      Physical Exam: General:  Well appearing. No resp difficulty HEENT: normal Neck: supple. no JVD. Carotids 2+ bilat; no bruits. No lymphadenopathy or thryomegaly appreciated. Cor: PMI nondisplaced. Regular No rubs, gallops or murmurs. Lungs: clear Abdomen: soft, nontender, nondistended. No hepatosplenomegaly. No bruits or masses. Good bowel sounds. Extremities: no cyanosis, clubbing, rash, edema Neuro: alert & orientedx3, cranial nerves grossly intact. moves all 4 extremities w/o difficulty. Affect pleasant   Telemetry: Sinus 80s with frequent PVCs (10-24 per minute) No NSVT Personally reviewed    Labs: Basic Metabolic Panel: Recent Labs  Lab 10/21/20 0251 10/22/20 0141 10/25/20 0950 10/25/20 0959 10/26/20 0359 10/27/20 0241  NA 132* 132* 134* 136 133* 134*  K 4.6 4.6 4.0 4.1 4.8 4.6  CL 99 101 100 101 100 103  CO2 24 22 25   --  27 23  GLUCOSE 105* 130* 109* 108* 95 112*  BUN 27* 29* 30* 34* 26* 22*  CREATININE 1.56* 1.60* 1.66* 1.70* 1.83* 1.59*  CALCIUM 9.0 9.1 9.4  --  8.9 8.9  MG 2.4 2.4 2.3  --   --   --      Liver Function Tests: Recent Labs  Lab 10/25/20 0950   AST 26  ALT 23  ALKPHOS 52  BILITOT 0.5  PROT 7.0  ALBUMIN 3.4*    No results for input(s): LIPASE, AMYLASE in the last 168 hours. No results for input(s): AMMONIA in the last 168 hours.  CBC: Recent Labs  Lab 10/25/20 0950 10/25/20 0959  WBC 6.3  --   NEUTROABS 4.2  --   HGB 12.6* 13.6  HCT 40.3 40.0  MCV 87.8  --   PLT 339  --      Cardiac Enzymes: No results for input(s): CKTOTAL, CKMB, CKMBINDEX, TROPONINI in the last 168 hours.  BNP: BNP (last 3 results) Recent Labs    10/12/20 1253 10/25/20 0948  BNP 558.3* 344.2*     ProBNP (last 3 results) No results for input(s): PROBNP in the last 8760 hours.    Other results:  Imaging: DG Chest 2 View  Result Date: 10/27/2020 CLINICAL DATA:  Preop exam. EXAM: CHEST - 2 VIEW COMPARISON:  10/25/2020 FINDINGS: Cardiac silhouette is normal in size. Right paratracheal thickening. Lobular prominence of both hila. Findings consistent with adenopathy as noted on the CT dated 10/12/2020. Lungs are clear.  No pleural effusion or pneumothorax. Skeletal structures are intact. IMPRESSION: 1. No acute cardiopulmonary disease. 2. Mediastinal and hilar adenopathy, less apparent than on the CT from 10/12/2020. Electronically Signed   By: 12/12/2020  [place] M.D.   On: 10/27/2020 08:59     Medications:     Scheduled Medications:  calcium carbonate  1 tablet Oral BID   cholecalciferol  400 Units Oral BID   digoxin  0.125 mg Oral Daily   enoxaparin (LOVENOX) injection  40 mg Subcutaneous Q24H   folic acid  1 mg Oral Daily   loratadine  10 mg Oral Daily   [START ON 10/30/2020] methotrexate  10 mg Oral Q Wed   midodrine  5 mg Oral TID WC   multivitamin with minerals  1 tablet Oral Daily   pantoprazole  40 mg Oral Daily   predniSONE  40 mg Oral Q breakfast   sodium chloride flush  3 mL Intravenous Q12H   sulfamethoxazole-trimethoprim  1 tablet Oral Q M,W,F   tamsulosin  0.8 mg Oral Daily    Infusions:  sodium chloride      amiodarone 30 mg/hr (10/27/20 0731)    PRN Medications: sodium chloride, acetaminophen, ondansetron (ZOFRAN) IV, sodium chloride flush   Assessment/Plan:   NSVT/ Frequent PVCs  - On admit wearing LifeVest. Frequent runs of NSVT/PVC. Well over 25% burden. He has been on mexilitene 200 mg twice a day.  - Ectopy much improved on IV amio. Will continue IV amio.  - EP following. Plan for ICD tomorrow - Keep K >4 and Mag >2   2. Cardiac Sarcoid - cMRI 10/17/20 c/w cardiac sarcoid - Continue prednisone 40 mg daily and PPI - Had 1st dose Methotrexate 10 mg on 10/23/20 and will need weekly. On folic acid - Continue bactrim DS M-W-F for PJP prophylaxis - ICD planned for tomorrow   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 Volume status stable. Does not need diuretics - Developed hypotension with amio drip. SBP remains in the 90-110 range with midodrine 5 tid Continue -  Continue to hold bidil and farxiga for now.  - Dig level 0.6. Restart digoxin 0.125 mg daily  - See above for sarcoid treatment    4. CKD Stage  -Creatinine baseline 1.4-1.6 - Todays creatinine 1.59 Holding diuretics. Can resume as needed   5. Mediastinal Lymphadenopathy - 10/17/20 bronchoscopy + biopsy. Path showed no malignancy or evidence of sarcoid   Length of Stay: 2   Arvilla Meres MD 10/27/2020, 10:58 AM  Advanced Heart Failure Team Pager [phone] (M-F; 7a - 4p)  Please contact [facility] for night-coverage after hours (4p -7a ) and weekends on [email or web address]

## 2020-10-28 ENCOUNTER — Inpatient Hospital Stay (HOSPITAL_COMMUNITY)
Admission: EM | Disposition: A | Discharge status: Home / Self Care | Payer: Self-pay | Source: Home / Self Care | Attending: Internal Medicine

## 2020-10-28 DIAGNOSIS — I472 Ventricular tachycardia: Secondary | ICD-10-CM | POA: Diagnosis not present

## 2020-10-28 DIAGNOSIS — I428 Other cardiomyopathies: Secondary | ICD-10-CM | POA: Diagnosis not present

## 2020-10-28 HISTORY — PX: ICD IMPLANT: EP1208

## 2020-10-28 LAB — BASIC METABOLIC PANEL
Anion gap: 8 (ref 5–15)
Anion gap: 9 (ref 5–15)
BUN: 23 mg/dL — ABNORMAL HIGH (ref 6–20)
BUN: 23 mg/dL — ABNORMAL HIGH (ref 6–20)
CO2: 23 mmol/L (ref 22–32)
CO2: 23 mmol/L (ref 22–32)
Calcium: 9.3 mg/dL (ref 8.9–10.3)
Calcium: 9.4 mg/dL (ref 8.9–10.3)
Chloride: 102 mmol/L (ref 98–111)
Chloride: 103 mmol/L (ref 98–111)
Creatinine, Ser: 1.77 mg/dL — ABNORMAL HIGH (ref 0.61–1.24)
Creatinine, Ser: 1.77 mg/dL — ABNORMAL HIGH (ref 0.61–1.24)
GFR, Estimated: 45 mL/min — ABNORMAL LOW (ref >60)
GFR, Estimated: 45 mL/min — ABNORMAL LOW (ref >60)
Glucose, Bld: 110 mg/dL — ABNORMAL HIGH (ref 70–99)
Glucose, Bld: 112 mg/dL — ABNORMAL HIGH (ref 70–99)
Potassium: 4.6 mmol/L (ref 3.5–5.1)
Potassium: 4.6 mmol/L (ref 3.5–5.1)
Sodium: 134 mmol/L — ABNORMAL LOW (ref 135–145)
Sodium: 134 mmol/L — ABNORMAL LOW (ref 135–145)

## 2020-10-28 LAB — CBC
HCT: 40 % (ref 39.0–52.0)
Hemoglobin: 12.8 g/dL — ABNORMAL LOW (ref 13.0–17.0)
MCH: 27.3 pg (ref 26.0–34.0)
MCHC: 32 g/dL (ref 30.0–36.0)
MCV: 85.3 fL (ref 80.0–100.0)
Platelets: 268 10*3/uL (ref 150–400)
RBC: 4.69 MIL/uL (ref 4.22–5.81)
RDW: 15.2 % (ref 11.5–15.5)
WBC: 11.9 10*3/uL — ABNORMAL HIGH (ref 4.0–10.5)
nRBC: 0 % (ref 0.0–0.2)

## 2020-10-28 LAB — MAGNESIUM: Magnesium: 2.2 mg/dL (ref 1.7–2.4)

## 2020-10-28 SURGERY — ICD IMPLANT

## 2020-10-28 MED ORDER — SODIUM CHLORIDE 0.9 % IV SOLN
INTRAVENOUS | Status: AC
  Filled 2020-10-28: qty 2

## 2020-10-28 MED ORDER — CEFAZOLIN SODIUM-DEXTROSE 2-4 GM/100ML-% IV SOLN
INTRAVENOUS | Status: AC
  Filled 2020-10-28: qty 100

## 2020-10-28 MED ORDER — LIDOCAINE HCL (PF) 1 % IJ SOLN
INTRAMUSCULAR | Status: DC | PRN
  Administered 2020-10-28: 60 mL

## 2020-10-28 MED ORDER — CHLORHEXIDINE GLUCONATE 4 % EX LIQD
60.0000 mL | Freq: Once | CUTANEOUS | Status: AC
Start: 2020-10-28 — End: 2020-10-28
  Administered 2020-10-28: 4 via TOPICAL
  Filled 2020-10-28: qty 60

## 2020-10-28 MED ORDER — FENTANYL CITRATE (PF) 100 MCG/2ML IJ SOLN
INTRAMUSCULAR | Status: AC
  Filled 2020-10-28: qty 2

## 2020-10-28 MED ORDER — MIDAZOLAM HCL 5 MG/5ML IJ SOLN
INTRAMUSCULAR | Status: DC | PRN
  Administered 2020-10-28: 1 mg via INTRAVENOUS
  Administered 2020-10-28: .5 mg via INTRAVENOUS

## 2020-10-28 MED ORDER — SODIUM CHLORIDE 0.9 % IV SOLN: INTRAVENOUS | Status: DC

## 2020-10-28 MED ORDER — CHLORHEXIDINE GLUCONATE 4 % EX LIQD
60.0000 mL | Freq: Once | CUTANEOUS | Status: AC
  Administered 2020-10-28: 4 via TOPICAL
  Filled 2020-10-28: qty 60

## 2020-10-28 MED ORDER — CEFAZOLIN SODIUM-DEXTROSE 2-4 GM/100ML-% IV SOLN
2.0000 g | INTRAVENOUS | Status: AC
Start: 2020-10-28 — End: 2020-10-28
  Administered 2020-10-28: 2 g via INTRAVENOUS
  Filled 2020-10-28: qty 100

## 2020-10-28 MED ORDER — GENTAMICIN SULFATE 40 MG/ML IJ SOLN
80.0000 mg | INTRAMUSCULAR | Status: AC
  Administered 2020-10-28: 80 mg
  Filled 2020-10-28: qty 2

## 2020-10-28 MED ORDER — MIDAZOLAM HCL 5 MG/5ML IJ SOLN
INTRAMUSCULAR | Status: AC
  Filled 2020-10-28: qty 5

## 2020-10-28 MED ORDER — HEPARIN (PORCINE) IN NACL 1000-0.9 UT/500ML-% IV SOLN
INTRAVENOUS | Status: DC | PRN
  Administered 2020-10-28: 500 mL

## 2020-10-28 MED ORDER — FENTANYL CITRATE (PF) 100 MCG/2ML IJ SOLN
INTRAMUSCULAR | Status: DC | PRN
  Administered 2020-10-28: 25 ug via INTRAVENOUS
  Administered 2020-10-28: 12.5 ug via INTRAVENOUS

## 2020-10-28 MED ORDER — LIDOCAINE HCL 1 % IJ SOLN
INTRAMUSCULAR | Status: AC
  Filled 2020-10-28: qty 60

## 2020-10-28 SURGICAL SUPPLY — 8 items
CABLE SURGICAL S-101-97-12 (CABLE) ×2 IMPLANT
ICD ACTICOR DX (ICD Generator) ×1 IMPLANT
LEAD PLEXA 65/15 (Lead) ×1 IMPLANT
MAT PREVALON FULL STRYKER (MISCELLANEOUS) ×1 IMPLANT
PAD PRO RADIOLUCENT 2001M-C (PAD) ×2 IMPLANT
SHEATH 8FR PRELUDE SNAP 13 (SHEATH) ×1 IMPLANT
SHEATH PROBE COVER 6X72 (BAG) ×1 IMPLANT
TRAY PACEMAKER INSERTION (PACKS) ×2 IMPLANT

## 2020-10-28 NOTE — TOC CM/SW Note (Signed)
HF TOC CM contacted [employer] rep, Alvino Chapel to make aware pt will have ICD placed today. States she will pick up Life Vest on 10/29/2020. Isidoro Donning RN3 CCM, Heart Failure TOC CM [phone]

## 2020-10-28 NOTE — Progress Notes (Signed)
Progress Note  Patient Name: Nathan [name] Date of Encounter: 10/28/2020  [facility] Cardiologist: Chilton Si, MD   Subjective   No new c/o or concerns  Inpatient Medications    Scheduled Meds:  calcium carbonate  1 tablet Oral BID   cholecalciferol  400 Units Oral BID   digoxin  0.125 mg Oral Daily   folic acid  1 mg Oral Daily   gentamicin irrigation  80 mg Irrigation On Call   loratadine  10 mg Oral Daily   [START ON 10/30/2020] methotrexate  10 mg Oral Q Wed   midodrine  5 mg Oral TID WC   multivitamin with minerals  1 tablet Oral Daily   pantoprazole  40 mg Oral Daily   predniSONE  40 mg Oral Q breakfast   sodium chloride flush  3 mL Intravenous Q12H   sulfamethoxazole-trimethoprim  1 tablet Oral Q M,W,F   tamsulosin  0.8 mg Oral Daily   Continuous Infusions:  sodium chloride     sodium chloride 50 mL/hr at 10/28/20 0630   amiodarone 30 mg/hr (10/28/20 0049)    ceFAZolin (ANCEF) IV     PRN Meds: sodium chloride, acetaminophen, ondansetron (ZOFRAN) IV, sodium chloride flush   Vital Signs    Vitals:   10/27/20 2029 10/28/20 0022 10/28/20 0316 10/28/20 0400  BP: (!) 123/98 115/87 113/78   Pulse: 93 (!) 101 91   Resp: 18 19 18    Temp: 97.9 F (36.6 C) 97.8 F (36.6 C) 97.8 F (36.6 C)   TempSrc: Oral Oral Oral   SpO2: 100% 100% 96%   Weight:    95.7 kg  Height:        Intake/Output Summary (Last 24 hours) at 10/28/2020 0828 Last data filed at 10/28/2020 0817 Gross per 24 hour  Intake 1819.93 ml  Output 2400 ml  Net -580.07 ml   Last 3 Weights 10/28/2020 10/27/2020 10/26/2020  Weight (lbs) 210 lb 14.4 oz 212 lb 4.9 oz 213 lb 14.4 oz  Weight (kg) 95.664 kg 96.3 kg 97.024 kg      Telemetry    SR, occ-frequent PVCs, one 5beat NSVT - Personally Reviewed  ECG    No new EKGs - Personally Reviewed  Physical Exam   GEN: No acute distress.   Neck: No JVD Cardiac: RRR, no murmurs, rubs, or gallops.  Respiratory: CTA b/l. GI: Soft,  nontender, non-distended  MS: No edema; No deformity. Neuro:  Nonfocal  Psych: Normal affect   Labs    High Sensitivity Troponin:   Recent Labs  Lab 10/13/20 0524 10/14/20 0549 10/14/20 0820 10/25/20 0950 10/25/20 1218  TROPONINIHS 193* 129* 120* 26* 29*     Chemistry Recent Labs  Lab 10/22/20 0141 10/25/20 0950 10/25/20 0959 10/26/20 0359 10/27/20 0241 10/28/20 0308  NA 132* 134*   < > 133* 134* 134*  134*  K 4.6 4.0   < > 4.8 4.6 4.6  4.6  CL 101 100   < > 100 103 103  102  CO2 22 25  --  27 23 23  23   GLUCOSE 130* 109*   < > 95 112* 112*  110*  BUN 29* 30*   < > 26* 22* 23*  23*  CREATININE 1.60* 1.66*   < > 1.83* 1.59* 1.77*  1.77*  CALCIUM 9.1 9.4  --  8.9 8.9 9.4  9.3  MG 2.4 2.3  --   --   --   --   PROT  --  7.0  --   --   --   --   ALBUMIN  --  3.4*  --   --   --   --   AST  --  26  --   --   --   --   ALT  --  23  --   --   --   --   ALKPHOS  --  52  --   --   --   --   BILITOT  --  0.5  --   --   --   --   GFRNONAA 51* 49*  --  44* 52* 45*  45*  ANIONGAP 9 9  --  6 8 8  9    < > = values in this interval not displayed.    Lipids No results for input(s): CHOL, TRIG, HDL, LABVLDL, LDLCALC, CHOLHDL in the last 168 hours.  Hematology Recent Labs  Lab 10/25/20 0950 10/25/20 0959 10/28/20 0308  WBC 6.3  --  11.9*  RBC 4.59  --  4.69  HGB 12.6* 13.6 12.8*  HCT 40.3 40.0 40.0  MCV 87.8  --  85.3  MCH 27.5  --  27.3  MCHC 31.3  --  32.0  RDW 15.4  --  15.2  PLT 339  --  268   Thyroid No results for input(s): TSH, FREET4 in the last 168 hours.  BNP Recent Labs  Lab 10/25/20 0948  BNP 344.2*    DDimer No results for input(s): DDIMER in the last 168 hours.   Radiology    DG Chest 2 View  Result Date: 10/27/2020 CLINICAL DATA:  Preop exam. EXAM: CHEST - 2 VIEW COMPARISON:  10/25/2020 FINDINGS: Cardiac silhouette is normal in size. Right paratracheal thickening. Lobular prominence of both hila. Findings consistent with adenopathy as  noted on the CT dated 10/12/2020. Lungs are clear.  No pleural effusion or pneumothorax. Skeletal structures are intact. IMPRESSION: 1. No acute cardiopulmonary disease. 2. Mediastinal and hilar adenopathy, less apparent than on the CT from 10/12/2020. Electronically Signed   By: 12/12/2020 M.D.   On: 10/27/2020 08:59    Cardiac Studies   10/16/20: LHC   Hemodynamic findings consistent with mild pulmonary hypertension.   Estimated moderate-severe MR with 3-4+ based on size of V wave on PCWP tracing.   Angiographically Normal Coronary Arteries   SUMMARY Angiographically normal coronary arteries Mild to moderate Pulmonary Hypertension secondary to Mitral Regurgitation, and elevated LVEDP from left-sided failure. PAP 46/19 mm with a mean of 33 mmHg.  P CWP 27 mmHg and LVEDP 30 mmHg.  In the setting of systolic pressures 90/20 mmHg Cardiac Output (Fick) 6.75-3.06.-Index SVR 10.96, PVR 0.9 (Wood units)     RECOMMENDATIONS Transfer to cardiac telemetry unit for continued management.   Was given 40 mg IV Lasix in the Cath Lab, defer further diuresis to cardiology service.     10/17/20: c.MRI FINDINGS: 1. Severe left ventricular dilation, with LVEDD 70 mm, but LVEDVi 142 mL/m2.   Normal left ventricular thickness, with intraventricular septal thickness of 7 mm, posterior wall thickness of 7 mm, and septal to posterior ratio < 1.5.   Severe left ventricular systolic dysfunction (LVEF = 18%). There is severe basal and mid anterior, anteroseptal, inferior, and lateral walls hypokinesis.   Left ventricular parametric mapping notable for slight elevation in the native T1 signal and normal T2 signal.   There is late gadolinium enhancement in the left ventricular myocardium: Patchy basal septal  LGE, transmural basal inferolateral and anterolateral LGE with associated thinning that extends into the apex. There is inferior epicardial LGE in the base. Though this could be consistent with  multi-vessel disease, lack of subendocardial involvement and patchy nature would favor sarcoidosis.   2. Normal right ventricular size with RVEDVI 58 mL/m2.   Normal right ventricular thickness.   Moderate right ventricular systolic dysfunction (RVEF =33%). There are no regional wall motion abnormalities or aneurysms.   3.  Normal left and right atrial size.   4. Normal size of the aortic root, ascending aorta and pulmonary artery.   5.  Qualitatively there is at least moderate mitral regurgitation.   6.  Normal pericardium.  No pericardial effusion.   7. There are bilateral effusions and regions within the right pleural effusion that are isointense with myocardium. This is best seen posterior to the left atrium. Correlates to area on recent CTPE with Hounsfield units of 258. This may be consistent with patient's bulky lymphadenopathy with some associated atelectasis. Ultimately, would recommended dedicated study if additional imaging need for non-cardiac pathology.   8.  Breath hold artifact noted.   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 Non-cardiac findings discussed with primary and pulmonology teams.      Echo: 10/13/2020  1. Left ventricular ejection fraction, by estimation, is 30 to 35%. The  left ventricle has moderately decreased function. The left ventricle  demonstrates global hypokinesis. The left ventricular internal cavity size was severely dilated. Left ventricular diastolic parameters are indeterminate.   2. Right ventricular systolic function is normal. The right ventricular  size is normal. There is moderately elevated pulmonary artery systolic pressure.   3. Left atrial size was moderately dilated.   4. The mitral valve is normal in structure. Severe mitral valve  regurgitation. No evidence of mitral stenosis.   5. The aortic valve is tricuspid. Aortic valve regurgitation is trivial.  Mild aortic valve  sclerosis is present, with no evidence of aortic valve stenosis.   6. The inferior vena cava is normal in size with greater than 50%  respiratory variability, suggesting right atrial pressure of 3 mmHg.   Patient Profile     55 y.o. 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 Cardiac sarcoid  Planned for ICD implant today with Dr. Niel Hummer VDD system Dr. Lalla Brothers has seen and examined the patient this AM Patient had no follow up questions this AM, remains agreeable  Primary ongoing management with HF team  For questions or updates, please contact [facility] Please consult [email or web address] for contact info under        Signed, Sheilah Pigeon, PA-C  10/28/2020, 8:28 AM

## 2020-10-28 NOTE — Progress Notes (Addendum)
Advanced Heart Failure Rounding Note   Subjective:    Going for ICD today  Continues on IV amiodarone at 30/hr. Still with frequent PVCs. No NSVT overnight  BP stable on midodrine 5 mg TID  No CP, SOB, palpitations or dizziness.     Objective:   Weight Range: Weight change: -0.636 kg   Vital Signs:   Temp:  [97 F (36.1 C)-97.9 F (36.6 C)] 97.8 F (36.6 C) (09/26 0316) Pulse Rate:  [91-102] 99 (09/26 0832) Resp:  [18-20] 18 (09/26 0832) BP: (101-123)/(76-98) 117/81 (09/26 0832) SpO2:  [96 %-100 %] 96 % (09/26 0316) Weight:  [95.7 kg] 95.7 kg (09/26 0400) Last BM Date: 10/27/20  Weight change: Filed Weights   10/26/20 0057 10/27/20 0400 10/28/20 0400  Weight: 97 kg 96.3 kg 95.7 kg    Intake/Output:   Intake/Output Summary (Last 24 hours) at 10/28/2020 0834 Last data filed at 10/28/2020 0817 Gross per 24 hour  Intake 1819.93 ml  Output 2400 ml  Net -580.07 ml     Physical Exam: General:  Well appearing AAM. No resp difficulty HEENT: normal Neck: supple. JVP ~ 6 cm. Carotids 2+ bilat; no bruits. No lymphadenopathy or thryomegaly appreciated. Cor: PMI nondisplaced. Regular No rubs, gallops or murmurs. Lungs: clear Abdomen: soft, nontender, nondistended. No hepatosplenomegaly. No bruits or masses. Good bowel sounds. Extremities: no cyanosis, clubbing, rash, edema Neuro: alert & orientedx3, cranial nerves grossly intact. moves all 4 extremities w/o difficulty. Affect pleasant   Telemetry: Sinus 80s-90s, frequent PVCs (up to 34/min overnight. Personally reviewed.     Labs: Basic Metabolic Panel: Recent Labs  Lab 10/22/20 0141 10/25/20 0950 10/25/20 0959 10/26/20 0359 10/27/20 0241 10/28/20 0308  NA 132* 134* 136 133* 134* 134*  134*  K 4.6 4.0 4.1 4.8 4.6 4.6  4.6  CL 101 100 101 100 103 103  102  CO2 22 25  --  27 23 23  23   GLUCOSE 130* 109* 108* 95 112* 112*  110*  BUN 29* 30* 34* 26* 22* 23*  23*  CREATININE 1.60* 1.66* 1.70* 1.83*  1.59* 1.77*  1.77*  CALCIUM 9.1 9.4  --  8.9 8.9 9.4  9.3  MG 2.4 2.3  --   --   --   --     Liver Function Tests: Recent Labs  Lab 10/25/20 0950  AST 26  ALT 23  ALKPHOS 52  BILITOT 0.5  PROT 7.0  ALBUMIN 3.4*   No results for input(s): LIPASE, AMYLASE in the last 168 hours. No results for input(s): AMMONIA in the last 168 hours.  CBC: Recent Labs  Lab 10/25/20 0950 10/25/20 0959 10/28/20 0308  WBC 6.3  --  11.9*  NEUTROABS 4.2  --   --   HGB 12.6* 13.6 12.8*  HCT 40.3 40.0 40.0  MCV 87.8  --  85.3  PLT 339  --  268    Cardiac Enzymes: No results for input(s): CKTOTAL, CKMB, CKMBINDEX, TROPONINI in the last 168 hours.  BNP: BNP (last 3 results) Recent Labs    10/12/20 1253 10/25/20 0948  BNP 558.3* 344.2*    ProBNP (last 3 results) No results for input(s): PROBNP in the last 8760 hours.    Other results:  Imaging: DG Chest 2 View  Result Date: 10/27/2020 CLINICAL DATA:  Preop exam. EXAM: CHEST - 2 VIEW COMPARISON:  10/25/2020 FINDINGS: Cardiac silhouette is normal in size. Right paratracheal thickening. Lobular prominence of both hila. Findings consistent with adenopathy as noted  on the CT dated 10/12/2020. Lungs are clear.  No pleural effusion or pneumothorax. Skeletal structures are intact. IMPRESSION: 1. No acute cardiopulmonary disease. 2. Mediastinal and hilar adenopathy, less apparent than on the CT from 10/12/2020. Electronically Signed   By: Amie Portland M.D.   On: 10/27/2020 08:59     Medications:     Scheduled Medications:  calcium carbonate  1 tablet Oral BID   cholecalciferol  400 Units Oral BID   digoxin  0.125 mg Oral Daily   folic acid  1 mg Oral Daily   gentamicin irrigation  80 mg Irrigation On Call   loratadine  10 mg Oral Daily   [START ON 10/30/2020] methotrexate  10 mg Oral Q Wed   midodrine  5 mg Oral TID WC   multivitamin with minerals  1 tablet Oral Daily   pantoprazole  40 mg Oral Daily   predniSONE  40 mg Oral Q  breakfast   sodium chloride flush  3 mL Intravenous Q12H   sulfamethoxazole-trimethoprim  1 tablet Oral Q M,W,F   tamsulosin  0.8 mg Oral Daily    Infusions:  sodium chloride     sodium chloride 50 mL/hr at 10/28/20 0630   amiodarone 30 mg/hr (10/28/20 0049)    ceFAZolin (ANCEF) IV      PRN Medications: sodium chloride, acetaminophen, ondansetron (ZOFRAN) IV, sodium chloride flush   Assessment/Plan:   NSVT/ Frequent PVCs  - On admit wearing LifeVest. Frequent runs of NSVT/PVC. Well over 25% burden. He has been on mexilitene 200 mg twice a day.  - Ectopy much improved on IV amio. Transitioning to po amiodarone prior to discharge. - EP following. Plan for ICD today - Keep K >4 and Mag >2   2. Cardiac Sarcoid - cMRI 10/17/20 c/w cardiac sarcoid - Continue prednisone 40 mg daily and PPI - Had 1st dose Methotrexate 10 mg on 10/23/20 and will need weekly. On folic acid - Continue bactrim DS M-W-F for PJP prophylaxis - ICD planned for today   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 Volume status stable. No diuretic today.  - Developed hypotension with amio drip. SBP improved with midodrine 5 tid. Now 110s/80s. - Holding bidil . Hopefully can restart prior to discharge.  - Holding [place]. Restart if BP and renal function remain stable.  - Dig level 0.6. Continue digoxin 0.125 mg daily  - See above for sarcoid treatment    4. CKD Stage IIIa -Creatinine baseline 1.4-1.6 - Todays creatinine 1.77 today. Holding diuretics. Can resume as needed. Does not appear overloaded.   5. Mediastinal Lymphadenopathy - 10/17/20 bronchoscopy + biopsy. Path showed no malignancy or evidence of sarcoid   Length of Stay: 3   FINCH, LINDSAY N MD 10/28/2020, 8:34 AM  Advanced Heart Failure Team Pager [phone] (M-F; 7a - 4p)  Please contact [facility] for night-coverage after hours (4p -7a ) and weekends on [email or web address]  Patient seen and examined with the above-signed Advanced  Practice Provider and/or Housestaff. I personally reviewed laboratory data, imaging studies and relevant notes. I independently examined the patient and formulated the important aspects of the plan. I have edited the note to reflect any of my changes or salient points. I have personally discussed the plan with the patient and/or family.  Remains on mexilitene and IV amio. Continues with frequent PVCs. No further NSVT. Volume status ok.   Pending ICD today  General:  Well appearing. No resp difficulty HEENT: normal Neck: supple. no JVD. Carotids  2+ bilat; no bruits. No lymphadenopathy or thryomegaly appreciated. Cor: PMI nondisplaced. Regular rate & rhythm. No rubs, gallops or murmurs. Lungs: clear Abdomen: soft, nontender, nondistended. No hepatosplenomegaly. No bruits or masses. Good bowel sounds. Extremities: no cyanosis, clubbing, rash, edema Neuro: alert & orientedx3, cranial nerves grossly intact. moves all 4 extremities w/o difficulty. Affect pleasant  Continue mexilitene and amio. For ICD today. HF stable Continue prednisone for cardiac sarcoid. Keep K > 4.0 Mg > 2.0   Arvilla Meres, MD  9:14 AM

## 2020-10-28 NOTE — Discharge Instructions (Signed)
    Supplemental Discharge Instructions for  Pacemaker/Defibrillator Patients   Activity No heavy lifting or vigorous activity with your left/right arm for 6 to 8 weeks.  Do not raise your left/right arm above your head for one week.  Gradually raise your affected arm as drawn below.             10/1/2                      11/03/20                    11/04/20                   11/05/20 __  NO DRIVING for 1 week  ; you may begin driving on  70/1/77 .  WOUND CARE Keep the wound area clean and dry.  Do not get this area wet , no showers for one week; you may shower on 11/05/20  . The tape/steri-strips on your wound will fall off; do not pull them off.  No bandage is needed on the site.  DO  NOT apply any creams, oils, or ointments to the wound area. If you notice any drainage or discharge from the wound, any swelling or bruising at the site, or you develop a fever > 101? F after you are discharged home, call the office at once.  Special Instructions You are still able to use cellular telephones; use the ear opposite the side where you have your pacemaker/defibrillator.  Avoid carrying your cellular phone near your device. When traveling through airports, show security personnel your identification card to avoid being screened in the metal detectors.  Ask the security personnel to use the hand wand. Avoid arc welding equipment, MRI testing (magnetic resonance imaging), TENS units (transcutaneous nerve stimulators).  Call the office for questions about other devices. Avoid electrical appliances that are in poor condition or are not properly grounded. Microwave ovens are safe to be near or to operate.  Additional information for defibrillator patients should your device go off: If your device goes off ONCE and you feel fine afterward, notify the device clinic nurses. If your device goes off ONCE and you do not feel well afterward, call 911. If your device goes off TWICE, call 911. If your device  goes off THREE times in one day, call 911.  DO NOT DRIVE YOURSELF OR A FAMILY MEMBER WITH A DEFIBRILLATOR TO THE HOSPITAL--CALL 911.

## 2020-10-28 NOTE — Discharge Summary (Addendum)
Advanced Heart Failure Team  Discharge Summary   Patient ID: Nathan [name] MRN: [id number], DOB/AGE: [birth year] 55 y.o. Admit date: 10/25/2020 D/C date:     10/29/2020   Primary Discharge Diagnoses:  NSVT/Frequent PVCs Cardiac sarcoid Chronic heart failure with reduced ejection fraction Nonischemic cardiomyopathy CKD stage 3a Mediastinal lymphadenopathy Mitral valve regurgitation  Hospital Course: Nathan [name] is a 55 y/o AAM with history of  peripheral focal chorioretinal inflammation of both eyes diagnosed in 2020.  Followed at [facility] ophthalmology.  He was treated with methotrexate and folic acid but self discontinued after 6 months in 2021.   Admitted 10/12/20 with increased shortness of breath. CT chest with bulky mediastinal lymphadenopathy. Pulmonary consulted. Had bronchoscopy + biopsy. Path showed no malignancy. Echo showed EF 25 with reduced RV and severe MR. Cath showed normal cors and stable hemodynamics. Had CMRI that showed EF 18% with LGE pattern consistent with cardiac sarcoidosis. At least moderate MR> GMIT limited by hypotension. He was on 1/2 tab bidil + farxiga. Placed on prednisone, metrotrexate, bactrim for PJP. Had NSVT and high PVC burden was placed on mexilitene 200 mg twice a day. Discharged with Life Vest due to frequent PVCs.   He took 1st dose on MTX 9/21.  He presented on 09/23 after his LifeVest alarmed multiple times. Had frequent runs of NSVT and > 25% PVC burden. He was placed on IV amiodarone with some suppression of PVCs. Had been on Mexilitine prior to admit. This was discontinued and was started on IV amiodarone which was switched to po at discharged. EP consulted. D/t extensive transmural LGE and severely reduced LVEF, ICD placed for primary prevention.   Had hypotension with amiodarone gtt and was started on midodrine. GDMT limited by hypotension. See below for hospital course by problem.   Hospital Course by Problem NSVT/ Frequent PVCs  - On  admit wearing LifeVest. Frequent runs of NSVT/PVCs. Well over 25% PVC burden. He had been on mexilitene 200 mg twice a day.  - Ectopy improved on IV amio. Transitioning to po amiodarone 200 mg BID X 1 week then 200 mg daily. - EP consulted. Appreciate input. S/p ICD placement on 09/26. Device function normal on interrogation today. No PTX on chest x-ray. Has incision check and EP f/u scheduled.   2. Cardiac Sarcoid - cMRI 10/17/20 c/w cardiac sarcoid - Continue prednisone 40 mg daily and PPI - Had 1st dose Methotrexate 10 mg on 10/23/20 and will need weekly. On folic acid, calcium and vitamin D. - Continue bactrim DS M-W-F for PJP prophylaxis - S/p ICD 09/26   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 Volume status stable. No diuretic during stay. - Developed hypotension with amio drip. Started on midodrine which was increased to 10 mg TID. - Farxiga 10 mg daily - Spiro 12.5 mg daily - BiDil held at D/C d/t soft BP - consider adding back at f/u next week if BP stable - Dig level 0.6. Continue digoxin 0.125 mg daily  - See above for sarcoid treatment    4. CKD Stage  -Creatinine baseline 1.4-1.6, stable at 1.54 day of discharge   5. Mediastinal Lymphadenopathy - 10/17/20 bronchoscopy + biopsy. Path showed no malignancy or evidence of sarcoid  6. Mitral valve regurgitation - Severe on echo 09/22 - Moderate to severe on [facility] 09/22 based on size of V wave and PCWP tracing - Appeared to be at least moderate in severity on recent cMRI  - Will need to monitor  Discharge Weight Range: 215-211 lb Discharge Vitals: Blood pressure 101/76, pulse 92, temperature (!) 97.3 F (36.3 C), temperature source Oral, resp. rate 18, height 5\' 10"  (1.778 m), weight 95.8 kg, SpO2 100 %.  Labs: Lab Results  Component Value Date   WBC 11.9 (H) 10/28/2020   HGB 12.8 (L) 10/28/2020   HCT 40.0 10/28/2020   MCV 85.3 10/28/2020   PLT 268 10/28/2020    Recent Labs  Lab 10/25/20 0950  10/25/20 0959 10/29/20 0550  NA 134*   < > 133*  K 4.0   < > 4.4  CL 100   < > 104  CO2 25   < > 22  BUN 30*   < > 24*  CREATININE 1.66*   < > 1.54*  CALCIUM 9.4   < > 8.9  PROT 7.0  --   --   BILITOT 0.5  --   --   ALKPHOS 52  --   --   ALT 23  --   --   AST 26  --   --   GLUCOSE 109*   < > 94   < > = values in this interval not displayed.   No results found for: CHOL, HDL, LDLCALC, TRIG BNP (last 3 results) Recent Labs    10/12/20 1253 10/25/20 0948  BNP 558.3* 344.2*    ProBNP (last 3 results) No results for input(s): PROBNP in the last 8760 hours.   Diagnostic Studies/Procedures   DG Chest 2 View  Result Date: 10/29/2020 CLINICAL DATA:  ICD EXAM: CHEST - 2 VIEW COMPARISON:  10/27/2020 FINDINGS: Interval placement of ICD with lead in right ventricle. No pneumothorax. Heart is normal size. No confluent opacities or effusions. IMPRESSION: Left ICD placement.  No pneumothorax. Electronically Signed   By: 10/29/2020 M.D.   On: 10/29/2020 09:16    Discharge Medications   Allergies as of 10/29/2020   No Known Allergies      Medication List     STOP taking these medications    Aspirin Low Dose 81 MG EC tablet Generic drug: aspirin   hydrALAZINE 25 MG tablet Commonly known as: APRESOLINE   isosorbide mononitrate 30 MG 24 hr tablet Commonly known as: IMDUR   mexiletine 200 MG capsule Commonly known as: MEXITIL       TAKE these medications    amiodarone 200 MG tablet Commonly known as: Pacerone Take 1 tablet twice a day for 1 week then decrease to 1 tablet daily   Calcium Carbonate-Vitamin D3 600-400 MG-UNIT Tabs Commonly known as: Calcium 600+D Take 1 tablet by mouth 2 (two) times daily.   CertaVite/Antioxidants Tabs Take 1 tablet by mouth daily.   cetirizine 10 MG tablet Commonly known as: ZYRTEC Take 10 mg by mouth daily.   dapagliflozin propanediol 10 MG Tabs tablet Commonly known as: FARXIGA Take 1 tablet (10 mg total) by mouth  daily.   digoxin 0.125 MG tablet Commonly known as: LANOXIN Take 1 tablet (0.125 mg total) by mouth daily.   feeding supplement Liqd Take 237 mLs by mouth 2 (two) times daily between meals.   folic acid 1 MG tablet Commonly known as: FOLVITE Take 1 tablet (1 mg total) by mouth daily.   methotrexate 2.5 MG tablet Commonly known as: RHEUMATREX Take 4 tablets (10 mg total) by mouth every Wednesday. Caution:Chemotherapy. Protect from light. Start taking on: October 30, 2020   midodrine 10 MG tablet Commonly known as: PROAMATINE Take 1 tablet (10 mg total) by  mouth 3 (three) times daily with meals.   montelukast 10 MG tablet Commonly known as: SINGULAIR Take 10 mg by mouth daily.   omeprazole 40 MG capsule Commonly known as: PRILOSEC Take 40 mg by mouth every morning.   predniSONE 20 MG tablet Commonly known as: DELTASONE Take 2 tablets (40 mg total) by mouth daily with breakfast.   spironolactone 25 MG tablet Commonly known as: ALDACTONE Take 0.5 tablets (12.5 mg total) by mouth daily.   sulfamethoxazole-trimethoprim 800-160 MG tablet Commonly known as: BACTRIM DS Take 1 tablet by mouth every Monday, Wednesday, and Friday. Start taking on: October 30, 2020   tamsulosin 0.4 MG Caps capsule Commonly known as: FLOMAX Take 2 capsules (0.8 mg total) by mouth daily.        Disposition   The patient will be discharged in stable condition to home. Discharge Instructions     Call MD for:  redness, tenderness, or signs of infection (pain, swelling, redness, odor or green/yellow discharge around incision site)   Complete by: As directed    Diet - low sodium heart healthy   Complete by: As directed    Heart Failure patients record your daily weight using the same scale at the same time of day   Complete by: As directed        Follow-up Information     [facility] Office Follow up.   Specialty: Cardiology Why: 11/07/20 @ 3:30PM, wound check  visit Contact information: [address] [place] [address] [phone]        Lanier Prude, MD .   Specialties: Cardiology, Radiology Why: 01/30/21 @ 1:45PM Contact information: [address] [place] [address] [phone]         [facility] Follow up on 11/04/2020.   Specialty: Cardiology Why: Advanced Heart Failure Clinic at [facility] 9:30 am Entrance C, Garage Code [id number] Contact information: [address] [id number] [place] [address] [phone]                  Duration of Discharge Encounter: Greater than 35 minutes   Signed, Maximino Sarin  10/29/2020, 10:28 AM   Patient seen and examined with the above-signed Advanced Practice Provider and/or Housestaff. I personally reviewed laboratory data, imaging studies and relevant notes. I independently examined the patient and formulated the important aspects of the plan. I have edited the note to reflect any of my changes or salient points. I have personally discussed the plan with the patient and/or family.  He is ready for d/c. See rounding note for full details.   Arvilla Meres, MD  10:53 AM

## 2020-10-29 ENCOUNTER — Encounter (HOSPITAL_COMMUNITY): Payer: Self-pay | Admitting: Cardiology

## 2020-10-29 ENCOUNTER — Other Ambulatory Visit (HOSPITAL_COMMUNITY): Payer: Self-pay

## 2020-10-29 ENCOUNTER — Inpatient Hospital Stay (HOSPITAL_COMMUNITY): Payer: 59

## 2020-10-29 DIAGNOSIS — I5022 Chronic systolic (congestive) heart failure: Secondary | ICD-10-CM | POA: Diagnosis not present

## 2020-10-29 DIAGNOSIS — Z95810 Presence of automatic (implantable) cardiac defibrillator: Secondary | ICD-10-CM | POA: Diagnosis not present

## 2020-10-29 DIAGNOSIS — D8685 Sarcoid myocarditis: Secondary | ICD-10-CM | POA: Diagnosis not present

## 2020-10-29 DIAGNOSIS — I472 Ventricular tachycardia: Secondary | ICD-10-CM | POA: Diagnosis not present

## 2020-10-29 DIAGNOSIS — Z01818 Encounter for other preprocedural examination: Secondary | ICD-10-CM

## 2020-10-29 LAB — BASIC METABOLIC PANEL
Anion gap: 7 (ref 5–15)
BUN: 24 mg/dL — ABNORMAL HIGH (ref 6–20)
CO2: 22 mmol/L (ref 22–32)
Calcium: 8.9 mg/dL (ref 8.9–10.3)
Chloride: 104 mmol/L (ref 98–111)
Creatinine, Ser: 1.54 mg/dL — ABNORMAL HIGH (ref 0.61–1.24)
GFR, Estimated: 54 mL/min — ABNORMAL LOW (ref >60)
Glucose, Bld: 94 mg/dL (ref 70–99)
Potassium: 4.4 mmol/L (ref 3.5–5.1)
Sodium: 133 mmol/L — ABNORMAL LOW (ref 135–145)

## 2020-10-29 LAB — MAGNESIUM: Magnesium: 2 mg/dL (ref 1.7–2.4)

## 2020-10-29 MED ORDER — METHOTREXATE 2.5 MG PO TABS
10.0000 mg | ORAL_TABLET | ORAL | 0 refills | Status: DC
  Filled 2020-10-29: qty 5, 7d supply, fill #0

## 2020-10-29 MED ORDER — AMIODARONE HCL 200 MG PO TABS
ORAL_TABLET | ORAL | 0 refills | Status: DC
  Filled 2020-10-29 (×2): qty 38, 30d supply, fill #0

## 2020-10-29 MED ORDER — DAPAGLIFLOZIN PROPANEDIOL 10 MG PO TABS
10.0000 mg | ORAL_TABLET | Freq: Every day | ORAL | 0 refills | Status: DC
  Filled 2020-10-29: qty 30, 30d supply, fill #0

## 2020-10-29 MED ORDER — SPIRONOLACTONE 12.5 MG HALF TABLET
12.5000 mg | ORAL_TABLET | Freq: Every day | ORAL | Status: DC
  Administered 2020-10-29: 12.5 mg via ORAL
  Filled 2020-10-29: qty 1

## 2020-10-29 MED ORDER — AMIODARONE HCL 200 MG PO TABS
200.0000 mg | ORAL_TABLET | Freq: Two times a day (BID) | ORAL | Status: DC
  Administered 2020-10-29: 200 mg via ORAL
  Filled 2020-10-29: qty 1

## 2020-10-29 MED ORDER — DAPAGLIFLOZIN PROPANEDIOL 10 MG PO TABS
10.0000 mg | ORAL_TABLET | Freq: Every day | ORAL | Status: DC
  Administered 2020-10-29: 10 mg via ORAL
  Filled 2020-10-29: qty 1

## 2020-10-29 MED ORDER — SULFAMETHOXAZOLE-TRIMETHOPRIM 800-160 MG PO TABS
1.0000 | ORAL_TABLET | ORAL | 0 refills | Status: DC
  Filled 2020-10-29: qty 12, 28d supply, fill #0

## 2020-10-29 MED ORDER — MIDODRINE HCL 10 MG PO TABS
10.0000 mg | ORAL_TABLET | Freq: Three times a day (TID) | ORAL | 0 refills | Status: DC
  Filled 2020-10-29: qty 93, 31d supply, fill #0
  Filled 2020-10-29: qty 90, 30d supply, fill #0

## 2020-10-29 MED ORDER — MIDODRINE HCL 5 MG PO TABS
10.0000 mg | ORAL_TABLET | Freq: Three times a day (TID) | ORAL | Status: DC
  Administered 2020-10-29: 10 mg via ORAL
  Filled 2020-10-29: qty 2

## 2020-10-29 MED ORDER — DIGOXIN 125 MCG PO TABS
0.1250 mg | ORAL_TABLET | Freq: Every day | ORAL | 0 refills | Status: DC
  Filled 2020-10-29: qty 30, 30d supply, fill #0

## 2020-10-29 MED ORDER — SPIRONOLACTONE 25 MG PO TABS
12.5000 mg | ORAL_TABLET | Freq: Every day | ORAL | 0 refills | Status: DC
  Filled 2020-10-29 (×2): qty 15, 30d supply, fill #0

## 2020-10-29 MED ORDER — PREDNISONE 20 MG PO TABS
40.0000 mg | ORAL_TABLET | Freq: Every day | ORAL | 0 refills | Status: DC
  Filled 2020-10-29: qty 30, 15d supply, fill #0

## 2020-10-29 MED FILL — Lidocaine HCl Local Inj 1%: INTRAMUSCULAR | Qty: 60 | Status: AC

## 2020-10-29 NOTE — Progress Notes (Addendum)
Advanced Heart Failure Rounding Note   Subjective:    S/p Biotronik ICD placement 09/26.   Remains on IV amiodarone at 30 mg/hr. Continues with frequent PVCs.  SBP soft, 90s - low 100s. On 5 mg midodrine TID.  Mild tenderness at ICD site otherwise no complaints. Denies dyspnea. No dizziness.    Objective:   Weight Range: Weight change: 0.091 kg   Vital Signs:   Temp:  [97.1 F (36.2 C)-98.4 F (36.9 C)] 98.4 F (36.9 C) (09/27 0404) Pulse Rate:  [0-149] 87 (09/27 0404) Resp:  [0-146] 18 (09/27 0404) BP: (93-133)/(55-83) 93/64 (09/27 0404) SpO2:  [0 %-100 %] 99 % (09/27 0404) Weight:  [95.8 kg] 95.8 kg (09/27 0404) Last BM Date: 10/28/20  Weight change: Filed Weights   10/27/20 0400 10/28/20 0400 10/29/20 0404  Weight: 96.3 kg 95.7 kg 95.8 kg    Intake/Output:   Intake/Output Summary (Last 24 hours) at 10/29/2020 0706 Last data filed at 10/29/2020 0530 Gross per 24 hour  Intake 452.09 ml  Output 1625 ml  Net -1172.91 ml     Physical Exam: General:  Well appearing AAM. No resp difficulty. Device rep at bedside. HEENT: normal Neck: supple. no JVD. Carotids 2+ bilat; no bruits. No lymphadenopathy or thryomegaly appreciated. Cor: PMI nondisplaced. Regular rhythm with frequent ectopy. No rubs, gallops or murmurs. Dressing noted over incision on left upper chest. Lungs: clear Abdomen: soft, nontender, nondistended. No hepatosplenomegaly. No bruits or masses. Good bowel sounds. Extremities: no cyanosis, clubbing, rash, edema. Sling on RUE Neuro: alert & orientedx3, cranial nerves grossly intact. moves all 4 extremities w/o difficulty. Affect pleasant    Telemetry: Sinus 90s, frequent PVCs up to 25/min (personally reviewed)    Labs: Basic Metabolic Panel: Recent Labs  Lab 10/25/20 0950 10/25/20 0959 10/26/20 0359 10/27/20 0241 10/28/20 0308 10/29/20 0550  NA 134* 136 133* 134* 134*  134* 133*  K 4.0 4.1 4.8 4.6 4.6  4.6 4.4  CL 100 101 100 103  103  102 104  CO2 25  --  27 23 23  23 22   GLUCOSE 109* 108* 95 112* 112*  110* 94  BUN 30* 34* 26* 22* 23*  23* 24*  CREATININE 1.66* 1.70* 1.83* 1.59* 1.77*  1.77* 1.54*  CALCIUM 9.4  --  8.9 8.9 9.4  9.3 8.9  MG 2.3  --   --   --  2.2 2.0    Liver Function Tests: Recent Labs  Lab 10/25/20 0950  AST 26  ALT 23  ALKPHOS 52  BILITOT 0.5  PROT 7.0  ALBUMIN 3.4*   No results for input(s): LIPASE, AMYLASE in the last 168 hours. No results for input(s): AMMONIA in the last 168 hours.  CBC: Recent Labs  Lab 10/25/20 0950 10/25/20 0959 10/28/20 0308  WBC 6.3  --  11.9*  NEUTROABS 4.2  --   --   HGB 12.6* 13.6 12.8*  HCT 40.3 40.0 40.0  MCV 87.8  --  85.3  PLT 339  --  268    Cardiac Enzymes: No results for input(s): CKTOTAL, CKMB, CKMBINDEX, TROPONINI in the last 168 hours.  BNP: BNP (last 3 results) Recent Labs    10/12/20 1253 10/25/20 0948  BNP 558.3* 344.2*    ProBNP (last 3 results) No results for input(s): PROBNP in the last 8760 hours.    Other results:  Imaging: DG Chest 2 View  Result Date: 10/27/2020 CLINICAL DATA:  Preop exam. EXAM: CHEST - 2 VIEW COMPARISON:  10/25/2020 FINDINGS: Cardiac silhouette is normal in size. Right paratracheal thickening. Lobular prominence of both hila. Findings consistent with adenopathy as noted on the CT dated 10/12/2020. Lungs are clear.  No pleural effusion or pneumothorax. Skeletal structures are intact. IMPRESSION: 1. No acute cardiopulmonary disease. 2. Mediastinal and hilar adenopathy, less apparent than on the CT from 10/12/2020. Electronically Signed   By: Amie Portland M.D.   On: 10/27/2020 08:59     Medications:     Scheduled Medications:  calcium carbonate  1 tablet Oral BID   cholecalciferol  400 Units Oral BID   digoxin  0.125 mg Oral Daily   folic acid  1 mg Oral Daily   loratadine  10 mg Oral Daily   [START ON 10/30/2020] methotrexate  10 mg Oral Q Wed   midodrine  5 mg Oral TID WC    multivitamin with minerals  1 tablet Oral Daily   pantoprazole  40 mg Oral Daily   predniSONE  40 mg Oral Q breakfast   sodium chloride flush  3 mL Intravenous Q12H   sulfamethoxazole-trimethoprim  1 tablet Oral Q M,W,F   tamsulosin  0.8 mg Oral Daily    Infusions:  sodium chloride     amiodarone 30 mg/hr (10/28/20 2322)    PRN Medications: sodium chloride, acetaminophen, ondansetron (ZOFRAN) IV, sodium chloride flush   Assessment/Plan:   NSVT/ Frequent PVCs  - On admit wearing LifeVest. Frequent runs of NSVT/PVC. Well over 25% burden. He had been on mexilitene 200 mg twice a day.  - Ectopy improved on IV amio. Transitioning to po amiodarone 200 mg BID X 1 week then 200 mg daily. Stopping mexiletine. - EP following. S/p ICD placement 09/26. Normal device function on interrogation this am. No pneumothorax on CXR - Keep K >4 and Mag >2   2. Cardiac Sarcoid - cMRI 10/17/20 c/w cardiac sarcoid - Continue prednisone 40 mg daily and PPI - Had 1st dose Methotrexate 10 mg on 10/23/20 and will need weekly. On folic acid, vitamin D and calcium - Continue bactrim DS M-W-F for PJP prophylaxis - S/p ICD placement 09/26   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 Does not appear volume overloaded. No diuretics today, can use as needed in the future. - BP soft this am. Increase midodrine to 10 TID. Add back Farxiga 10 mg daily. - Sprio 12.5 mg daily - Dig level 0.6. Continue digoxin 0.125 mg daily  - See above for sarcoid treatment    4. CKD Stage IIIa -Creatinine baseline 1.4-1.6 - Scr 1.54 today.    5. Mediastinal Lymphadenopathy - 10/17/20 bronchoscopy + biopsy. Path showed no malignancy or evidence of sarcoid   Will discharge home today   Length of Stay: 4   FINCH, LINDSAY N MD 10/29/2020, 7:06 AM  Advanced Heart Failure Team Pager [phone] (M-F; 7a - 4p)  Please contact [facility] for night-coverage after hours (4p -7a ) and weekends on [email or web address]  Patient  seen and examined with the above-signed Advanced Practice Provider and/or Housestaff. I personally reviewed laboratory data, imaging studies and relevant notes. I independently examined the patient and formulated the important aspects of the plan. I have edited the note to reflect any of my changes or salient points. I have personally discussed the plan with the patient and/or family.  S/p ICD yesterday. Remains on IV amio. Still with PVCs and NSVT. Volume status ok. BP low  General:  Well appearing. No resp difficulty HEENT: normal Neck: supple. no  JVD. Carotids 2+ bilat; no bruits. No lymphadenopathy or thryomegaly appreciated. Cor: PMI nondisplaced. Regular rate & rhythm. No rubs, gallops or murmurs. ICD site ok  Lungs: clear Abdomen: soft, nontender, nondistended. No hepatosplenomegaly. No bruits or masses. Good bowel sounds. Extremities: no cyanosis, clubbing, rash, edema Neuro: alert & orientedx3, cranial nerves grossly intact. moves all 4 extremities w/o difficulty. Affect pleasant  Ok for d/c home today. Meds discussed at length.   Amio Midodrine 10 tid Farxiga 10 Spiro 12.5 Digoxin  Prednisone MTX Bactrim   Arvilla Meres, MD  10:52 AM

## 2020-10-29 NOTE — Progress Notes (Addendum)
Progress Note  Patient Name: Nathan [name] Date of Encounter: 10/29/2020  [facility] Cardiologist: Chilton Si, MD   Subjective   No new c/o or concerns, minimal discomfort at implant site  Inpatient Medications    Scheduled Meds:  calcium carbonate  1 tablet Oral BID   cholecalciferol  400 Units Oral BID   digoxin  0.125 mg Oral Daily   folic acid  1 mg Oral Daily   loratadine  10 mg Oral Daily   [START ON 10/30/2020] methotrexate  10 mg Oral Q Wed   midodrine  5 mg Oral TID WC   multivitamin with minerals  1 tablet Oral Daily   pantoprazole  40 mg Oral Daily   predniSONE  40 mg Oral Q breakfast   sodium chloride flush  3 mL Intravenous Q12H   sulfamethoxazole-trimethoprim  1 tablet Oral Q M,W,F   tamsulosin  0.8 mg Oral Daily   Continuous Infusions:  sodium chloride     amiodarone 30 mg/hr (10/28/20 2322)   PRN Meds: sodium chloride, acetaminophen, ondansetron (ZOFRAN) IV, sodium chloride flush   Vital Signs    Vitals:   10/28/20 2109 10/29/20 0005 10/29/20 0404 10/29/20 0739  BP: 102/74 108/77 93/64 101/76  Pulse: 94 93 87 92  Resp: 20 20 18 18   Temp: 97.8 F (36.6 C) 98.3 F (36.8 C) 98.4 F (36.9 C) (!) 97.3 F (36.3 C)  TempSrc: Oral Oral Oral Oral  SpO2: 100% 100% 99% 100%  Weight:   95.8 kg   Height:        Intake/Output Summary (Last 24 hours) at 10/29/2020 0912 Last data filed at 10/29/2020 0849 Gross per 24 hour  Intake 929.09 ml  Output 1725 ml  Net -795.91 ml   Last 3 Weights 10/29/2020 10/28/2020 10/27/2020  Weight (lbs) 211 lb 1.6 oz 210 lb 14.4 oz 212 lb 4.9 oz  Weight (kg) 95.754 kg 95.664 kg 96.3 kg      Telemetry    SR, occ-frequent PVCs, couplets - Personally Reviewed  ECG    SR 81bpm, PVCs, souplet - Personally Reviewed  Physical Exam   GEN: No acute distress.   Neck: No JVD Cardiac: RRR, no murmurs, rubs, or gallops.  Respiratory: CTA b/l. GI: Soft, nontender, non-distended  MS: No edema; No  deformity. Neuro:  Nonfocal  Psych: Normal affect   ICD site is dry, no hematoma, no bleeding/ecchymosis  Labs    High Sensitivity Troponin:   Recent Labs  Lab 10/13/20 0524 10/14/20 0549 10/14/20 0820 10/25/20 0950 10/25/20 1218  TROPONINIHS 193* 129* 120* 26* 29*     Chemistry Recent Labs  Lab 10/25/20 0950 10/25/20 0959 10/27/20 0241 10/28/20 0308 10/29/20 0550  NA 134*   < > 134* 134*  134* 133*  K 4.0   < > 4.6 4.6  4.6 4.4  CL 100   < > 103 103  102 104  CO2 25   < > 23 23  23 22   GLUCOSE 109*   < > 112* 112*  110* 94  BUN 30*   < > 22* 23*  23* 24*  CREATININE 1.66*   < > 1.59* 1.77*  1.77* 1.54*  CALCIUM 9.4   < > 8.9 9.4  9.3 8.9  MG 2.3  --   --  2.2 2.0  PROT 7.0  --   --   --   --   ALBUMIN 3.4*  --   --   --   --  AST 26  --   --   --   --   ALT 23  --   --   --   --   ALKPHOS 52  --   --   --   --   BILITOT 0.5  --   --   --   --   GFRNONAA 49*   < > 52* 45*  45* 54*  ANIONGAP 9   < > 8 8  9 7    < > = values in this interval not displayed.    Lipids No results for input(s): CHOL, TRIG, HDL, LABVLDL, LDLCALC, CHOLHDL in the last 168 hours.  Hematology Recent Labs  Lab 10/25/20 0950 10/25/20 0959 10/28/20 0308  WBC 6.3  --  11.9*  RBC 4.59  --  4.69  HGB 12.6* 13.6 12.8*  HCT 40.3 40.0 40.0  MCV 87.8  --  85.3  MCH 27.5  --  27.3  MCHC 31.3  --  32.0  RDW 15.4  --  15.2  PLT 339  --  268   Thyroid No results for input(s): TSH, FREET4 in the last 168 hours.  BNP Recent Labs  Lab 10/25/20 0948  BNP 344.2*    DDimer No results for input(s): DDIMER in the last 168 hours.   Radiology    No results found.  Cardiac Studies   10/16/20: LHC   Hemodynamic findings consistent with mild pulmonary hypertension.   Estimated moderate-severe MR with 3-4+ based on size of V wave on PCWP tracing.   Angiographically Normal Coronary Arteries   SUMMARY Angiographically normal coronary arteries Mild to moderate Pulmonary Hypertension  secondary to Mitral Regurgitation, and elevated LVEDP from left-sided failure. PAP 46/19 mm with a mean of 33 mmHg.  P CWP 27 mmHg and LVEDP 30 mmHg.  In the setting of systolic pressures 90/20 mmHg Cardiac Output (Fick) 6.75-3.06.-Index SVR 10.96, PVR 0.9 (Wood units)     RECOMMENDATIONS Transfer to cardiac telemetry unit for continued management.   Was given 40 mg IV Lasix in the Cath Lab, defer further diuresis to cardiology service.     10/17/20: c.MRI FINDINGS: 1. Severe left ventricular dilation, with LVEDD 70 mm, but LVEDVi 142 mL/m2.   Normal left ventricular thickness, with intraventricular septal thickness of 7 mm, posterior wall thickness of 7 mm, and septal to posterior ratio < 1.5.   Severe left ventricular systolic dysfunction (LVEF = 18%). There is severe basal and mid anterior, anteroseptal, inferior, and lateral walls hypokinesis.   Left ventricular parametric mapping notable for slight elevation in the native T1 signal and normal T2 signal.   There is late gadolinium enhancement in the left ventricular myocardium: Patchy basal septal LGE, transmural basal inferolateral and anterolateral LGE with associated thinning that extends into the apex. There is inferior epicardial LGE in the base. Though this could be consistent with multi-vessel disease, lack of subendocardial involvement and patchy nature would favor sarcoidosis.   2. Normal right ventricular size with RVEDVI 58 mL/m2.   Normal right ventricular thickness.   Moderate right ventricular systolic dysfunction (RVEF =33%). There are no regional wall motion abnormalities or aneurysms.   3.  Normal left and right atrial size.   4. Normal size of the aortic root, ascending aorta and pulmonary artery.   5.  Qualitatively there is at least moderate mitral regurgitation.   6.  Normal pericardium.  No pericardial effusion.   7. There are bilateral effusions and regions within the right pleural  effusion that are isointense with myocardium. This is best seen posterior to the left atrium. Correlates to area on recent CTPE with Hounsfield units of 258. This may be consistent with patient's bulky lymphadenopathy with some associated atelectasis. Ultimately, would recommended dedicated study if additional imaging need for non-cardiac pathology.   8.  Breath hold artifact noted.   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 Non-cardiac findings discussed with primary and pulmonology teams.      Echo: 10/13/2020  1. Left ventricular ejection fraction, by estimation, is 30 to 35%. The  left ventricle has moderately decreased function. The left ventricle  demonstrates global hypokinesis. The left ventricular internal cavity size was severely dilated. Left ventricular diastolic parameters are indeterminate.   2. Right ventricular systolic function is normal. The right ventricular  size is normal. There is moderately elevated pulmonary artery systolic pressure.   3. Left atrial size was moderately dilated.   4. The mitral valve is normal in structure. Severe mitral valve  regurgitation. No evidence of mitral stenosis.   5. The aortic valve is tricuspid. Aortic valve regurgitation is trivial.  Mild aortic valve sclerosis is present, with no evidence of aortic valve stenosis.   6. The inferior vena cava is normal in size with greater than 50%  respiratory variability, suggesting right atrial pressure of 3 mmHg.   Patient Profile     55 y.o. 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 Cardiac sarcoid  S/p ICD implant yesterday with dr. Lalla Brothers Site is  stable ICD check this AM with stable measurements CXR this AM with stable lead position, no ptx Wound care and activity restrictions were discussed with the patient  OK to discharge from EP perspective Dr. Lalla Brothers has seen and examined the patient STOP mexiletine  Continue amiodarone 200mg  BID for 1 week then 200mg  daily EP follow up is in place I have called life vest rep, she will come to the hospital and pick up his life vest  Primary ongoing management with HF team  For questions or updates, please contact [facility] Please consult [email or web address] for contact info under        Signed, , PA-C  10/29/2020, 9:12 AM

## 2020-10-29 NOTE — TOC CM/SW Note (Signed)
HF TOC CM spoke to pt and states [employer] reached out to him and they will pick up Life Vest today. Contacted rep to make aware of dc home today. States they will pick up this am. States he has medications from previous dc. Explained any new meds will have sent up from [facility]. Isidoro Donning RN3 CCM, Heart Failure TOC CM [phone]

## 2020-11-01 LAB — CYTOLOGY - NON PAP

## 2020-11-01 NOTE — Progress Notes (Addendum)
Advanced Heart Failure Clinic Note   PCP: Nathan Davenport, MD PCP-Cardiologist: Nathan Si, MD  HF Cardiologist: Dr. Gala [name]  HPI: [name] is a 55 y.o. male with history of chronic systolic heart failure, mitral regurgitation, VT s/p ICD, cardiac sarcoidosis and peripheral focal chorioretinal inflammation of both eyes diagnosed in 2020 (followed at [facility] ophthalmology).     Admitted 10/12/20 with increased shortness of breath. CT chest with bulky mediastinal lymphadenopathy. Pulmonary consulted, underwent bronchoscopy + biopsy. Path showed no malignancy. Echo showed EF 25 % with reduced RV. Underwent R/LHC with normal cors and stable hemodynamics. cMRI that showed EF 18% with LGE pattern consistent with cardiac sarcoidosis. GMDT limited by hypotension. He was on 1/2 tab Bidil + [place]. Placed on prednisone, metrotrexate, Bactrim for PJP. Had NSVT and high PVC burden and started on mexilitene 200 mg bid. Discharged with Life Vest due to frequent PVCs.   Re-admitted 10/25/20 after LifeVest alarmed several times. Had frequent runs of NSVT and > 25% PVC burden. He was placed on IV amiodarone with some suppression of PVCs. Mexitil was discontinued and IV amiodarone started, transitioned eventually  to PO. EP consulted and d/t extensive transmural LGE and severely reduced LVEF, ICD placed for primary prevention. Had hypotension with amiodarone gtt and was started on midodrine. GDMT limited by hypotension. Discharge weight 211-215 lbs.  Today he returns for post hospital HF follow up with his wife. He is walking daily in his neighborhood for 15 minutes at a time and does not have significant dyspnea. He does feel some palpitations occasionally. Overall feeling fine. Denies CP, dizziness, edema, or PND/Orthopnea. Appetite ok. No fever or chills. Weight at home 210-212 pounds. Taking all medications.   Cardiac Studies: - Echo (9/22): EF 25%, reduced RV.  - cMRI (9/22): EF 18% with LGE  pattern consistent with cardiac sarcoidosis.  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 PAP 46/19 mm with a mean of 33 mmHg.   PCWP 27 mmHg and LVEDP 30 mmHg.  In the setting of systolic pressures 90/20 mmHg Cardiac Output (Fick) 6.75-3.06.-Index SVR 10.96, PVR 0.9 (Wood units)  Review of Systems: [y] = yes, [ ]  = no   General: Weight gain [ ] ; Weight loss [ ] ; Anorexia [ ] ; Fatigue [ ] ; Fever [ ] ; Chills [ ] ; Weakness [ ]   Cardiac: Chest pain/pressure [ ] ; Resting SOB [ ] ; Exertional SOB [ ] ; Orthopnea [ ] ; Pedal Edema [ ] ; Palpitations ]; Syncope [ ] ; Presyncope [ ] ; Paroxysmal nocturnal dyspnea[ ]   Pulmonary: Cough [ ] ; Wheezing[ ] ; Hemoptysis[ ] ; Sputum [ ] ; Snoring [ ]   GI: Vomiting[ ] ; Dysphagia[ ] ; Melena[ ] ; Hematochezia [ ] ; Heartburn[ ] ; Abdominal pain [ ] ; Constipation [ ] ; Diarrhea [ ] ; BRBPR [ ]   GU: Hematuria[ ] ; Dysuria [ ] ; Nocturia[ ]   Vascular: Pain in legs with walking [ ] ; Pain in feet with lying flat [ ] ; Non-healing sores [ ] ; Stroke [ ] ; TIA [ ] ; Slurred speech [ ] ;  Neuro: Headaches[ ] ; Vertigo[ ] ; Seizures[ ] ; Paresthesias[ ] ;Blurred vision [ ] ; Diplopia [ ] ; Vision changes [ ]   Ortho/Skin: Arthritis [ ] ; Joint pain [ ] ; Muscle pain [ ] ; Joint swelling [ ] ; Back Pain [ ] ; Rash [ ]   Psych: Depression[ ] ; Anxiety[ ]   Heme: Bleeding problems [ ] ; Clotting disorders [ ] ; Anemia [ ]   Endocrine: Diabetes [ ] ; Thyroid dysfunction[ ]   Past Medical  History:  Diagnosis Date   Allergies    09/16/2020   BPH (benign prostatic hyperplasia)    ED (erectile dysfunction)    GERD (gastroesophageal reflux disease)    Peripheral focal chorioretinal inflammation of both eyes    Retinal vasculitis of both eyes    Current Outpatient Medications  Medication Sig Dispense Refill   amiodarone (PACERONE) 200 MG tablet Take 1 tablet by mouth twice a day for 1 week then decrease to 1 tablet once daily 38  tablet 0   Calcium Carbonate-Vitamin D3 (CALCIUM 600+D) 600-400 MG-UNIT TABS Take 1 tablet by mouth 2 (two) times daily. 180 tablet 3   cetirizine (ZYRTEC) 10 MG tablet Take 10 mg by mouth daily.     dapagliflozin propanediol (FARXIGA) 10 MG TABS tablet Take 1 tablet (10 mg total) by mouth daily. 30 tablet 0   digoxin (LANOXIN) 0.125 MG tablet Take 1 tablet (0.125 mg total) by mouth daily. 30 tablet 0   feeding supplement (ENSURE ENLIVE / ENSURE PLUS) LIQD Take 237 mLs by mouth 2 (two) times daily between meals. 237 mL 12   folic acid (FOLVITE) 1 MG tablet Take 1 tablet (1 mg total) by mouth daily. 30 tablet 11   methotrexate (RHEUMATREX) 2.5 MG tablet Take 4 tablets (10 mg total) by mouth every Wednesday. Caution:Chemotherapy. Protect from light. 5 tablet 0   midodrine (PROAMATINE) 10 MG tablet Take 1 tablet (10 mg total) by mouth 3 (three) times daily with meals. 93 tablet 0   Multiple Vitamins-Minerals (CERTAVITE/ANTIOXIDANTS) TABS Take 1 tablet by mouth daily. 30 tablet 0   omeprazole (PRILOSEC) 40 MG capsule Take 40 mg by mouth every morning.     predniSONE (DELTASONE) 20 MG tablet Take 2 tablets (40 mg total) by mouth daily with breakfast. 30 tablet 0   spironolactone (ALDACTONE) 25 MG tablet Take 1/2 tablet (12.5 mg total) by mouth daily. 30 tablet 0   sulfamethoxazole-trimethoprim (BACTRIM DS) 800-160 MG tablet Take 1 tablet by mouth every Monday, Wednesday, and Friday. 15 tablet 0   tamsulosin (FLOMAX) 0.4 MG CAPS capsule Take 2 capsules (0.8 mg total) by mouth daily. 60 capsule 0   No current facility-administered medications for this encounter.   No Known Allergies  Social History   Socioeconomic History   Marital status: Married    Spouse name: Not on file   Number of children: Not on file   Years of education: Not on file   Highest education level: Not on file  Occupational History   Not on file  Tobacco Use   Smoking status: Former    Packs/day: 0.50    Types:  Cigarettes    Quit date: 02/02/2010    Years since quitting: 10.7   Smokeless tobacco: Never  Substance and Sexual Activity   Alcohol use: Yes   Drug use: Not Currently   Sexual activity: Not on file  Other Topics Concern   Not on file  Social History Narrative   Not on file   Social Determinants of Health   Financial Resource Strain: Low Risk    Difficulty of Paying Living Expenses: Not very hard  Food Insecurity: No Food Insecurity   Worried About Running Out of Food in the Last Year: Never true   Ran Out of Food in the Last Year: Never true  Transportation Needs: No Transportation Needs   Lack of Transportation (Medical): No   Lack of Transportation (Non-Medical): No  Physical Activity: Not on file  Stress: Not on file  Social Connections: Not on file  Intimate Partner Violence: Not on file   Family History  Problem Relation Age of Onset   Cancer Father 62   Diabetes Father    BP 102/79   Pulse 83   Wt 99 kg (218 lb 3.2 oz)   SpO2 98%   BMI 31.31 kg/m   Wt Readings from Last 3 Encounters:  11/04/20 99 kg (218 lb 3.2 oz)  10/29/20 95.8 kg (211 lb 1.6 oz)  10/21/20 98.1 kg (216 lb 4.3 oz)   PHYSICAL EXAM: General:  NAD. No resp difficulty HEENT: Normal Neck: Supple. No JVD. Carotids 2+ bilat; no bruits. No lymphadenopathy or thryomegaly appreciated. Cor: PMI nondisplaced. Regular rate & rhythm. No rubs, gallops or murmurs.  Lungs: Clear Abdomen: Soft, nontender, nondistended. No hepatosplenomegaly. No bruits or masses. Good bowel sounds. Extremities: No cyanosis, clubbing, rash, edema Neuro: Alert & oriented x 3, cranial nerves grossly intact. Moves all 4 extremities w/o difficulty. Affect pleasant.  ECG: SR with PVCs 97 bpm (personally reviewed).  ASSESSMENT & PLAN: NSVT/ Frequent PVCs  - Frequent runs of NSVT/PVC and >25% burden. Previously on mexilitene 200 mg bid.  - S/p Biotronik ICD placement 10/28/20.  - SR on ECG today with PVCs. - Continue  amiodarone 200 mg daily.  - Keep K >4 and Mag >2 - Check mag today.   2. Cardiac Sarcoid - cMRI 10/17/20 c/w cardiac sarcoid. - Continue prednisone 40 mg daily and PPI. - Continue MTX q Wednesdays, folic acid, Vit D + calcium. - Continue bactrim DS M-W-F for PJP prophylaxis. - S/p ICD placement 09/26.   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 Does not appear volume overloaded. He is not on daily loop diuretic. - GDMT titration limited today by BP. - Continue midodrine 10 mg tid.  - Continue Farxiga 10 mg daily. - Continue sprionolactone 12.5 mg daily. - Continue digoxin 0.125 mg daily.  - BMET and digoxin level today. - Plan to wean down midodrine and add beta blocker + ARN/ARNi as able. - Will give Rx for BP cuff, I have asked him to check his BP at home and bring readings to next visit to help guide medication titration.  4. CKD Stage IIIa - SCr baseline 1.4-1.6 - BMET today.    5. Mediastinal Lymphadenopathy - 10/17/20 bronchoscopy + biopsy.  - Path showed no malignancy or evidence of sarcoid.  Follow up in 3 weeks with PharmD (plan to wean midodrine and add beta blocker + ARB/ARNi as able, would favor starting low-dose carvedilol first w/ PVCs on ECG today) and 6 weeks with APP for further medication titration.   Anderson Malta Haena, FNP 11/04/20

## 2020-11-04 ENCOUNTER — Other Ambulatory Visit: Payer: Self-pay

## 2020-11-04 ENCOUNTER — Other Ambulatory Visit (HOSPITAL_COMMUNITY): Payer: Self-pay

## 2020-11-04 ENCOUNTER — Encounter (HOSPITAL_COMMUNITY): Payer: Self-pay

## 2020-11-04 ENCOUNTER — Other Ambulatory Visit (HOSPITAL_COMMUNITY): Payer: Self-pay | Admitting: Family Medicine

## 2020-11-04 ENCOUNTER — Ambulatory Visit (HOSPITAL_COMMUNITY)
Admission: RE | Admit: 2020-11-04 | Discharge: 2020-11-04 | Disposition: A | Discharge status: Home / Self Care | Payer: 59 | Source: Ambulatory Visit | Attending: Family Medicine | Admitting: Family Medicine

## 2020-11-04 VITALS — BP 102/79 | HR 83 | Wt 218.2 lb

## 2020-11-04 DIAGNOSIS — I5022 Chronic systolic (congestive) heart failure: Secondary | ICD-10-CM | POA: Insufficient documentation

## 2020-11-04 DIAGNOSIS — I472 Ventricular tachycardia, unspecified: Secondary | ICD-10-CM | POA: Insufficient documentation

## 2020-11-04 DIAGNOSIS — D8685 Sarcoid myocarditis: Secondary | ICD-10-CM | POA: Diagnosis not present

## 2020-11-04 DIAGNOSIS — I428 Other cardiomyopathies: Secondary | ICD-10-CM | POA: Diagnosis not present

## 2020-11-04 DIAGNOSIS — Z7952 Long term (current) use of systemic steroids: Secondary | ICD-10-CM | POA: Insufficient documentation

## 2020-11-04 DIAGNOSIS — R590 Localized enlarged lymph nodes: Secondary | ICD-10-CM | POA: Insufficient documentation

## 2020-11-04 DIAGNOSIS — Z95810 Presence of automatic (implantable) cardiac defibrillator: Secondary | ICD-10-CM | POA: Insufficient documentation

## 2020-11-04 DIAGNOSIS — Z79899 Other long term (current) drug therapy: Secondary | ICD-10-CM | POA: Diagnosis not present

## 2020-11-04 DIAGNOSIS — N1831 Chronic kidney disease, stage 3a: Secondary | ICD-10-CM | POA: Diagnosis not present

## 2020-11-04 DIAGNOSIS — I493 Ventricular premature depolarization: Secondary | ICD-10-CM | POA: Insufficient documentation

## 2020-11-04 DIAGNOSIS — I5041 Acute combined systolic (congestive) and diastolic (congestive) heart failure: Secondary | ICD-10-CM

## 2020-11-04 LAB — BASIC METABOLIC PANEL
Anion gap: 7 (ref 5–15)
BUN: 29 mg/dL — ABNORMAL HIGH (ref 6–20)
CO2: 25 mmol/L (ref 22–32)
Calcium: 9.2 mg/dL (ref 8.9–10.3)
Chloride: 104 mmol/L (ref 98–111)
Creatinine, Ser: 1.58 mg/dL — ABNORMAL HIGH (ref 0.61–1.24)
GFR, Estimated: 52 mL/min — ABNORMAL LOW (ref >60)
Glucose, Bld: 111 mg/dL — ABNORMAL HIGH (ref 70–99)
Potassium: 4.3 mmol/L (ref 3.5–5.1)
Sodium: 136 mmol/L (ref 135–145)

## 2020-11-04 LAB — MAGNESIUM: Magnesium: 2.3 mg/dL (ref 1.7–2.4)

## 2020-11-04 LAB — DIGOXIN LEVEL: Digoxin Level: 1 ng/mL (ref 0.8–2.0)

## 2020-11-04 MED ORDER — OMRON 3 SERIES BP MONITOR DEVI
0 refills | Status: DC
Start: 2020-11-04 — End: 2023-11-12
  Filled 2020-11-04: qty 1, 1d supply, fill #0

## 2020-11-04 MED ORDER — DIGOXIN 125 MCG PO TABS: 0.0625 mg | ORAL_TABLET | Freq: Every day | ORAL | 3 refills | Status: DC

## 2020-11-04 NOTE — Patient Instructions (Signed)
EKG was done today  Labs were done today, if any labs come back abnormal the clinic will call you  PLEASE CHECK your blood pressure at home and log your results and bring them to the next visit  Your physician recommends that you schedule a follow-up appointment in: 6 weeks and then 3 months, 3 weeks with Pharmacy   At the [facility], you and your health needs are our priority. As part of our continuing mission to provide you with exceptional heart care, we have created designated Provider Care Teams. These Care Teams include your primary Cardiologist (physician) and Advanced Practice Providers (APPs- Physician Assistants and Nurse Practitioners) who all work together to provide you with the care you need, when you need it.   You may see any of the following providers on your designated Care Team at your next follow up: Dr Arvilla Meres Dr Marca Ancona Dr Brandon Melnick, NP Robbie Lis, [place] Mikki Santee Karle Plumber, PharmD   Please be sure to bring in all your medications bottles to every appointment.    If you have any questions or concerns before your next appointment please send [place] a message through [place] or call our office at [phone].    TO LEAVE A MESSAGE FOR THE NURSE SELECT OPTION 2, PLEASE LEAVE A MESSAGE INCLUDING: YOUR NAME DATE OF BIRTH CALL BACK NUMBER REASON FOR CALL**this is important as we prioritize the call backs  YOU WILL RECEIVE A CALL BACK THE SAME DAY AS LONG AS YOU CALL BEFORE 4:00 PM

## 2020-11-04 NOTE — Progress Notes (Signed)
Spoke with patient regarding labs today. He will hold his digoxin x 2 days then resume at 0.0625 mg daily. Medication list updated in chart and new Rx sent to pharmacy. Repeat labs scheduled for 11/14/20 @ 9:30.  Prince Rome, FNP-BC

## 2020-11-07 ENCOUNTER — Other Ambulatory Visit: Payer: Self-pay

## 2020-11-07 ENCOUNTER — Ambulatory Visit (INDEPENDENT_AMBULATORY_CARE_PROVIDER_SITE_OTHER): Payer: 59

## 2020-11-07 DIAGNOSIS — I4729 Other ventricular tachycardia: Secondary | ICD-10-CM | POA: Diagnosis not present

## 2020-11-07 DIAGNOSIS — D869 Sarcoidosis, unspecified: Secondary | ICD-10-CM

## 2020-11-07 LAB — CUP PACEART INCLINIC DEVICE CHECK
Battery Voltage: 3.11 V
Brady Statistic RV Percent Paced: 0 %
Date Time Interrogation Session: 20221006153826
HighPow Impedance: 61 Ohm
Implantable Lead Implant Date: 20220926
Implantable Lead Location: 753860
Implantable Lead Model: 436910
Implantable Lead Serial Number: 81470915
Implantable Pulse Generator Implant Date: 20220926
Lead Channel Impedance Value: 462 Ohm
Lead Channel Pacing Threshold Amplitude: 0.8 V
Lead Channel Pacing Threshold Pulse Width: 0.4 ms
Lead Channel Sensing Intrinsic Amplitude: 14.5 mV
Lead Channel Sensing Intrinsic Amplitude: 8.5 mV
Lead Channel Setting Pacing Amplitude: 3 V
Lead Channel Setting Pacing Pulse Width: 0.4 ms
Lead Channel Setting Sensing Sensitivity: 0.8 mV
Pulse Gen Model: 429525
Pulse Gen Serial Number: 84847461

## 2020-11-07 NOTE — Patient Instructions (Signed)
   After Your ICD (Implantable Cardiac Defibrillator)    Monitor your defibrillator site for redness, swelling, and drainage. Call the device clinic at [phone] if you experience these symptoms or fever/chills.  Your incision was closed with Steri-strips or staples:  You may shower 7 days after your procedure and wash your incision with soap and water. Avoid lotions, ointments, or perfumes over your incision until it is well-healed.    You may use a hot tub or a pool after your wound check appointment if the incision is completely closed.  Do not lift, push or pull greater than 10 pounds with the affected arm until 6 weeks after your procedure. There are no other restrictions in arm movement after your wound check appointment.  Your ICD is MRI compatible.  Your ICD is designed to protect you from life threatening heart rhythms. Because of this, you may receive a shock.   1 shock with no symptoms:  Call the office during business hours. 1 shock with symptoms (chest pain, chest pressure, dizziness, lightheadedness, shortness of breath, overall feeling unwell):  Call 911. If you experience 2 or more shocks in 24 hours:  Call 911. If you receive a shock, you should not drive.  [place] DMV - no driving for 6 months if you receive appropriate therapy from your ICD.   ICD Alerts:  Some alerts are vibratory and others beep. These are NOT emergencies. Please call our office to let us know. If this occurs at night or on weekends, it can wait until the next business day. Send a remote transmission.  If your device is capable of reading fluid status (for heart failure), you will be offered monthly monitoring to review this with you.   Remote monitoring is used to monitor your ICD from home. This monitoring is scheduled every 91 days by our office. It allows us to keep an eye on the functioning of your device to ensure it is working properly. You will routinely see your Electrophysiologist annually  (more often if necessary).   

## 2020-11-07 NOTE — Progress Notes (Signed)
Wound check appointment. Steri-strips removed. Wound without redness or edema. Incision edges approximated, wound well healed. Normal device function. Thresholds, sensing, and impedances consistent with implant measurements. Device programmed at 3.5V for extra safety margin until 3 month visit. Histogram distribution appropriate for patient and level of activity. 2 nsT episodes EGMs illustrates non-sustained VT each episode 4 seconds in duration with median V rate of 200bpm. Patient educated about wound care, arm mobility, lifting restrictions, shock plan. ROV 01/30/21 with CL.

## 2020-11-11 ENCOUNTER — Encounter (HOSPITAL_COMMUNITY): Payer: Self-pay | Admitting: *Deleted

## 2020-11-11 NOTE — Progress Notes (Signed)
Pt's son Reola Mosher) FMLA forms completed and signed by Dr Gala Romney for intermittent leave, forms faxed to [employer] at [phone], left VM for Greig Castilla this was done

## 2020-11-12 ENCOUNTER — Other Ambulatory Visit (HOSPITAL_COMMUNITY): Payer: Self-pay

## 2020-11-13 ENCOUNTER — Ambulatory Visit (INDEPENDENT_AMBULATORY_CARE_PROVIDER_SITE_OTHER): Payer: 59 | Admitting: Primary Care

## 2020-11-13 ENCOUNTER — Encounter: Payer: Self-pay | Admitting: Primary Care

## 2020-11-13 ENCOUNTER — Inpatient Hospital Stay: Payer: 59 | Admitting: Pulmonary Disease

## 2020-11-13 ENCOUNTER — Other Ambulatory Visit: Payer: Self-pay

## 2020-11-13 DIAGNOSIS — R590 Localized enlarged lymph nodes: Secondary | ICD-10-CM

## 2020-11-13 DIAGNOSIS — D8685 Sarcoid myocarditis: Secondary | ICD-10-CM | POA: Diagnosis not present

## 2020-11-13 NOTE — Assessment & Plan Note (Addendum)
-   Following closely with cardiology. Currently on Prednisone 40mg  daily, Bactrim DS MWF and methotrexate.

## 2020-11-13 NOTE — Progress Notes (Signed)
PCP: Dois Davenport, MD PCP-Cardiologist: Chilton Si, MD  HF Cardiologist: Dr. Gala Romney  HPI:  [name] is a 55 y.o. male with history of chronic systolic heart failure, mitral regurgitation, VT s/p ICD, cardiac sarcoidosis and peripheral focal chorioretinal inflammation of both eyes diagnosed in 2020 (followed at [facility] ophthalmology).     Admitted 10/12/20 with increased shortness of breath. CT chest with bulky mediastinal lymphadenopathy. Pulmonary consulted, underwent bronchoscopy + biopsy. Path showed no malignancy. Echo showed EF 25% with reduced RV. Underwent R/LHC with normal cors and stable hemodynamics. cMRI that showed EF 18% with LGE pattern consistent with cardiac sarcoidosis. GMDT limited by hypotension. He was on 1/2 tab Bidil + [place]. Placed on prednisone, methotrexate, Bactrim for PJP. Had NSVT and high PVC burden and started on mexiletine 200 mg BID. Discharged with Life Vest due to frequent PVCs.   Re-admitted 10/25/20 after LifeVest alarmed several times. Had frequent runs of NSVT and > 25% PVC burden. He was placed on IV amiodarone with some suppression of PVCs. Mexiletine was discontinued and IV amiodarone started, transitioned eventually to PO. EP consulted and d/t extensive transmural LGE and severely reduced LVEF, ICD placed for primary prevention. Had hypotension with amiodarone gtt and was started on midodrine. GDMT limited by hypotension. Discharge weight 211-215 lbs.   Recently presented to [facility] for post hospital HF follow up with his wife on 11/04/20. He reported walking daily in his neighborhood for 15 minutes at a time. Does not have significant dyspnea. He reported that he does feel some palpitations occasionally. Overall was feeling fine. Denied CP, dizziness, edema, or PND/Orthopnea. Appetite was ok. No fever or chills. Weight at home was 210-212 pounds. Reported taking all medications.   Today he returns to HF clinic for pharmacist medication  titration. Came with his wife who is an Charity fundraiser at [employer] [facility] [employer] [facility] [employer] [facility]. At last visit with [facility], digoxin was decreased to 0.0625 mg daily due to elevated digoxin level of 1.0 ng/mL. Overall he is feeling well today. No dizziness or lightheadedness. BP's at home have improved, generally 108-114/70s. BP in clinic today 114/64. Last week he noted pain on his right side when he took a deep breath. This had resolved by the next day. No palpitations. Has been walking 1/4-1/2 mile daily. Gets SOB going up stairs and up hills. Weight at home has been ~215 lbs. Feels like he has gained some weight recently due to increased appetite, likely from prednisone. Does not take any loop diuretic. No LEE, PND or orthopnea.    HF Medications: Farxiga 10 mg daily Spironolactone 12.5 mg daily Digoxin 0.0625 mg daily  Has the patient been experiencing any side effects to the medications prescribed?  no  Does the patient have any problems obtaining medications due to transportation or finances?   Has [employer] commercial insurance  Understanding of regimen: good Understanding of indications: good Potential of compliance: good Patient understands to avoid NSAIDs. Patient understands to avoid decongestants.    Pertinent Lab Values: 11/04/20: Serum creatinine 1.58, BUN 29, Potassium 4.3, Sodium 136, Magnesium 2.3, Digoxin <0.2 ng/mL (11/14/20)   Vital Signs: Weight: 218.2 lbs (last clinic weight: 220 lbs) Blood pressure: 114/64  Heart rate: 72   Assessment/Plan: NSVT/ Frequent PVCs  - Frequent runs of NSVT/PVC and >25% burden. Previously on mexiletine 200 mg BID.  - S/p Biotronik ICD placement 10/28/20.  - Continue amiodarone 200 mg daily.  - Keep K >4 and Mag >2   2. Cardiac Sarcoid - cMRI  10/17/20 c/w cardiac sarcoid. - Spoke to Dr. Gala Romney today regarding immunosuppression. Will decrease prednisone to 30 mg daily and increase methotrexate to 12.5 mg every Wednesday. At next visit, plan to decrease  prednisone to 25 mg daily and increase methotrexate to 15 mg every Wednesday. - Continue PPI while on prednisone. - Continue folic acid, Vit D + calcium. - Continue bactrim DS M-W-F for PJP prophylaxis while prednisone is >15 mg daily.  -Will need CBC+diff and CMET next visit - S/p ICD placement 10/28/20.   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 Does not appear volume overloaded. He is not on daily loop diuretic. - GDMT titration has been limited by BP - Decrease midodrine to 5 mg TID.  - Continue spironolactone 12.5 mg daily. - Continue Farxiga 10 mg daily. - Continue digoxin 0.0625 mg daily.  - Plan to wean down midodrine and add beta blocker + ARN/ARNi as able.   4. CKD Stage IIIa - SCr baseline 1.4-1.6   5. Mediastinal Lymphadenopathy - 10/17/20 bronchoscopy + biopsy.  - Path showed no malignancy or evidence of sarcoid.  Follow up 3 weeks with [facility]. Plan to continue weaning midodrine as able. Will also adjust immunosuppression therapy as indicated. Will need CBC+diff and CMET.    Karle Plumber, PharmD, BCPS, BCCP, CPP Heart Failure Clinic Pharmacist [phone]

## 2020-11-13 NOTE — Assessment & Plan Note (Addendum)
-    Patient underwent video bronchoscopy with endobronchial ultrasound on 10/18/2020 with Dr. Tonia Brooms. No evidence on malignancy or pulmonary sarcoid. Dr. Francine Graven plans to reach out to Dr. Gala Romney to see if they need help managing immunosuppression for cardiac sarcoid. Lymphadenopthy likely reactive d/t acute heart failure. There is always a possibility biopsies missed area of sarcoid involvement from lymph nodule. No follow-up needed with pulmonary unless indicated.

## 2020-11-13 NOTE — Patient Instructions (Signed)
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 It most often affects the lungs (pulmonary sarcoidosis). Sarcoidosis can also affect the lymph nodes, liver, eyes, skin, heart, or any other body tissue. Normally, cells that are part of the body's disease-fighting system (immune system) attack harmful substances in the body, such as germs. This immune system response causes inflammation. After the harmful substance is destroyed, the inflammation goes away. When you have sarcoidosis, your immune system causes inflammation even when there are no harmful substances, and the inflammation does not go away. Sarcoidosis also causes cells from your immune system to form small lumps (granulomas) in the affected area of your body. What are the causes? The exact cause of sarcoidosis is not known.  If you have a family history of this disease (genetic predisposition), the immune system response that leads to inflammation may be triggered by something in your environment, such as: Bacteria or viruses. Metals. Chemicals. Dust. Mold or mildew. What increases the risk? You may be more likely to develop this condition if you: Have a family history of the disease. Are African American. Are of Northern European descent. Are 63-24 years old. Are male. Work as a IT sales professional. Work in an environment where you are exposed to metals, chemicals, mold or mildew, or insecticides. What are the signs or symptoms? Some people with sarcoidosis have no symptoms. Others have very mild symptoms. The symptoms usually depend on the organ that is affected. Sarcoidosis most often affects the lungs,  which may lead to symptoms such as: Chest pain. Coughing. Wheezing. Shortness of breath. Other common symptoms include: Night sweats. Fever. Weight loss. Tiredness (fatigue). Swollen lymph nodes. Joint pain. How is this diagnosed? This condition may be diagnosed based on: Your symptoms and medical history. A physical exam. Imaging tests such as: Chest X-ray. CT scan. MRI. PET scan. Lung function tests. These tests evaluate your breathing and check for problems that may be related to sarcoidosis. A procedure to remove a tissue sample for testing (biopsy). You may have a biopsy of lung tissue if that is where you are having symptoms. You may have tests to check for any complications of the condition. These tests may include: Eye exams. MRI of the heart or brain. Echocardiogram. ECG (electrocardiogram). How is this treated? In some cases, sarcoidosis does not require a specific treatment because it causes no symptoms or only mild symptoms. If your symptoms bother you or are severe, you may be prescribed medicines to reduce inflammation or relieve symptoms. These medicines may include: Prednisone. This is a steroid that reduces inflammation related to sarcoidosis. Hydroxychloroquine. This may be used to treat sarcoidosis that affects the skin, eyes, or brain. Certain medicines that affect the immune system. These can help with sarcoidosis in the joints, eyes, skin, or lungs. Medicines that you breathe in (inhalers). Inhalers can help you breathe if sarcoidosis affects your lungs. Follow these instructions at home:  Do not use any products that contain nicotine or tobacco. These products include cigarettes, chewing tobacco, and vaping devices, such as e-cigarettes. If you need help quitting, ask your health care provider. Avoid secondhand smoke and irritating dust or chemicals. Stay indoors on days when air quality is poor in your  area. Return to your normal activities as told by  your health care provider. Ask your health care provider what activities are safe for you. Take or use over-the-counter and prescription medicines only as told by your health care provider. Keep all follow-up visits. This is important. Where to find more information National Heart, Lung, and Blood Institute: [employer] Contact a health care provider if: You have vision problems. You have a dry cough that does not go away. You have an irregular heartbeat. You have pain or aches in your joints, hands, or feet. You have an unexplained rash. Get help right away if: You have chest pain. You have trouble breathing. These symptoms may represent a serious problem that is an emergency. Do not wait to see if the symptoms will go away. Get medical help right away. Call your local emergency services (911 in the [place] Do not drive yourself to the hospital. Summary Sarcoidosis is a disease that can cause inflammation in many body areas of the body. It most often affects the lungs (pulmonary sarcoidosis). It can also affect the lymph nodes, liver, eyes, skin, heart, or any other body tissue. When you have sarcoidosis, cells from your immune system form small lumps (granulomas) in the affected area of your body. Sarcoidosis sometimes does not require a specific treatment because it causes no symptoms or only mild symptoms. If your symptoms bother you or are severe, you may be prescribed medicines to reduce inflammation or relieve symptoms. This information is not intended to replace advice given to you by your health care provider. Make sure you discuss any questions you have with your health care provider. Document Revised: 11/21/2019 Document Reviewed: 11/21/2019 Elsevier Patient Education  2022 [employer].

## 2020-11-13 NOTE — Progress Notes (Signed)
@Patient  ID: , male    DOB: January 18, 1966, 55 y.o.   MRN: [id number]  Chief Complaint  Patient presents with   Follow-up    Patient reports he is doing well with breathing.     Referring provider: [id number], MD  HPI: 55 year old male, former remote smoker 63s and 30s (quit in January 2012).  Past medical history significant for pleural effusion, mediastinal lymphadenopathy.  Patient was consulted inpatient by [facility] team by [facility] on 9/15.  11/13/2020- Interim hx  Patient presents today for hospital follow-up.  Patient was admitted in September for dyspnea and chest discomfort.  CT scan on admission showed small bilateral pleural effusion with mediastinal lymphadenopathy.  Diagnosis of cardiomyopathy with acute systolic heart failure and severe mitral regurgitation/ ef 30-35 %. Patient underwent video bronchoscopy with endobronchial ultrasound on 10/18/2020 with Dr. 10/20/2020.  He has a history of peripheral focal chorioretinal inflammation of both eyes diagnosed in 2020.  He is followed by [facility] ophthalmology.  He was treated with methotrexate and folic acid but self discontinued after 6 months in 2021.  He has not followed up with ophthalmologist. Suspect either sarcoidosis versus lymph oh proliferative disorder versus infection versus reactive LN and heart failure.  Patient completed course of ceftriaxone and azithromycin for possible CAP.  Doing well today. No acute complaints today. No shortness of breath or chest discomfort.  He is following closely with cardiology for cardiac sarcoid and HF. No evidence of malignancy or pulmonary sarcoidosis. Maintained on prednisone 40mg  daily, methotrexate 10mg  weekly and Bactrim DS MWF.    Imaging:  CT significant for bulky mediastinal, bilateral hilar lymphadenopathy, groundglass opacities bilaterally with bilateral pleural effusion.   No Known Allergies  Immunization History  Administered Date(s) Administered    Influenza,inj,Quad PF,6+ Mos 10/15/2020   PFIZER(Purple Top)SARS-COV-2 Vaccination 07/06/2019, 07/27/2019, 02/15/2020   Pfizer Covid-19 Vaccine Bivalent Booster 84yrs & up 10/21/2020    Past Medical History:  Diagnosis Date   Allergies    09/16/2020   BPH (benign prostatic hyperplasia)    ED (erectile dysfunction)    GERD (gastroesophageal reflux disease)    Peripheral focal chorioretinal inflammation of both eyes    Retinal vasculitis of both eyes     Tobacco History: Social History   Tobacco Use  Smoking Status Former   Packs/day: 0.50   Types: Cigarettes   Quit date: 02/02/2010   Years since quitting: 10.7  Smokeless Tobacco Never   Counseling given: Not Answered   Outpatient Medications Prior to Visit  Medication Sig Dispense Refill   amiodarone (PACERONE) 200 MG tablet Take 1 tablet by mouth twice a day for 1 week then decrease to 1 tablet once daily 38 tablet 0   Blood Pressure Monitoring (OMRON 3 SERIES BP MONITOR) DEVI Use as directed 1 each 0   Calcium Carbonate-Vitamin D3 (CALCIUM 600+D) 600-400 MG-UNIT TABS Take 1 tablet by mouth 2 (two) times daily. 180 tablet 3   cetirizine (ZYRTEC) 10 MG tablet Take 10 mg by mouth daily.     dapagliflozin propanediol (FARXIGA) 10 MG TABS tablet Take 1 tablet (10 mg total) by mouth daily. 30 tablet 0   digoxin (LANOXIN) 0.125 MG tablet Take 0.5 tablets (0.0625 mg total) by mouth daily. 30 tablet 3   feeding supplement (ENSURE ENLIVE / ENSURE PLUS) LIQD Take 237 mLs by mouth 2 (two) times daily between meals. 237 mL 12   folic acid (FOLVITE) 1 MG tablet Take 1 tablet (1 mg total) by  mouth daily. 30 tablet 11   methotrexate (RHEUMATREX) 2.5 MG tablet Take 4 tablets (10 mg total) by mouth every Wednesday. Caution:Chemotherapy. Protect from light. 5 tablet 0   midodrine (PROAMATINE) 10 MG tablet Take 1 tablet (10 mg total) by mouth 3 (three) times daily with meals. 93 tablet 0   Multiple Vitamins-Minerals (CERTAVITE/ANTIOXIDANTS) TABS  Take 1 tablet by mouth daily. 30 tablet 0   omeprazole (PRILOSEC) 40 MG capsule Take 40 mg by mouth every morning.     predniSONE (DELTASONE) 20 MG tablet Take 2 tablets (40 mg total) by mouth daily with breakfast. 30 tablet 0   spironolactone (ALDACTONE) 25 MG tablet Take 1/2 tablet (12.5 mg total) by mouth daily. 30 tablet 0   sulfamethoxazole-trimethoprim (BACTRIM DS) 800-160 MG tablet Take 1 tablet by mouth every Monday, Wednesday, and Friday. 15 tablet 0   tamsulosin (FLOMAX) 0.4 MG CAPS capsule Take 2 capsules (0.8 mg total) by mouth daily. 60 capsule 0   No facility-administered medications prior to visit.   Review of Systems  Review of Systems  Constitutional: Negative.   HENT: Negative.    Respiratory:  Negative for cough, chest tightness, shortness of breath and wheezing.   Cardiovascular: Negative.     Physical Exam  BP 104/70 (BP Location: Left Arm, Patient Position: Sitting, Cuff Size: Normal)   Pulse 81   Temp 98 F (36.7 C) (Oral)   Ht 5\' 10"  (1.778 m)   Wt 214 lb (97.1 kg)   BMI 30.71 kg/m  Physical Exam Constitutional:      Appearance: Normal appearance.  HENT:     Head: Normocephalic and atraumatic.  Cardiovascular:     Rate and Rhythm: Normal rate.     Comments: Regularly irregular  Pulmonary:     Effort: Pulmonary effort is normal.     Breath sounds: Normal breath sounds. No wheezing, rhonchi or rales.     Comments: CTA Skin:    General: Skin is warm and dry.  Neurological:     General: No focal deficit present.     Mental Status: He is alert and oriented to person, place, and time. Mental status is at baseline.  Psychiatric:        Mood and Affect: Mood normal.        Behavior: Behavior normal.        Thought Content: Thought content normal.        Judgment: Judgment normal.     Lab Results:  CBC    Component Value Date/Time   WBC 11.9 (H) 10/28/2020 0308   RBC 4.69 10/28/2020 0308   HGB 12.8 (L) 10/28/2020 0308   HCT 40.0 10/28/2020  0308   PLT 268 10/28/2020 0308   MCV 85.3 10/28/2020 0308   MCH 27.3 10/28/2020 0308   MCHC 32.0 10/28/2020 0308   RDW 15.2 10/28/2020 0308   LYMPHSABS 1.2 10/25/2020 0950   MONOABS 0.7 10/25/2020 0950   EOSABS 0.2 10/25/2020 0950   BASOSABS 0.0 10/25/2020 0950    BMET    Component Value Date/Time   NA 136 11/04/2020 1014   K 4.3 11/04/2020 1014   CL 104 11/04/2020 1014   CO2 25 11/04/2020 1014   GLUCOSE 111 (H) 11/04/2020 1014   BUN 29 (H) 11/04/2020 1014   CREATININE 1.58 (H) 11/04/2020 1014   CALCIUM 9.2 11/04/2020 1014   GFRNONAA 52 (L) 11/04/2020 1014   GFRAA >60 12/05/2015 1215    BNP    Component Value Date/Time   BNP  344.2 (H) 10/25/2020 0948    ProBNP No results found for: PROBNP  Imaging: DG Chest 2 View  Result Date: 10/29/2020 CLINICAL DATA:  ICD EXAM: CHEST - 2 VIEW COMPARISON:  10/27/2020 FINDINGS: Interval placement of ICD with lead in right ventricle. No pneumothorax. Heart is normal size. No confluent opacities or effusions. IMPRESSION: Left ICD placement.  No pneumothorax. Electronically Signed   By: Charlett Nose M.D.   On: 10/29/2020 09:16   DG Chest 2 View  Result Date: 10/27/2020 CLINICAL DATA:  Preop exam. EXAM: CHEST - 2 VIEW COMPARISON:  10/25/2020 FINDINGS: Cardiac silhouette is normal in size. Right paratracheal thickening. Lobular prominence of both hila. Findings consistent with adenopathy as noted on the CT dated 10/12/2020. Lungs are clear.  No pleural effusion or pneumothorax. Skeletal structures are intact. IMPRESSION: 1. No acute cardiopulmonary disease. 2. Mediastinal and hilar adenopathy, less apparent than on the CT from 10/12/2020. Electronically Signed   By: Amie Portland M.D.   On: 10/27/2020 08:59   CARDIAC CATHETERIZATION  Result Date: 10/16/2020   Hemodynamic findings consistent with mild pulmonary hypertension.   Estimated moderate-severe MR with 3-4+ based on size of V wave on PCWP tracing.   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 PAP 46/19 mm with a mean of 33 mmHg.  P CWP 27 mmHg and LVEDP 30 mmHg.  In the setting of systolic pressures 90/20 mmHg Cardiac Output (Fick) 6.75-3.06.-Index SVR 10.96, PVR 0.9 (Wood units) RECOMMENDATIONS Transfer to cardiac telemetry unit for continued management.  Was given 40 mg IV Lasix in the Cath Lab, defer further diuresis to cardiology service. Bryan Lemma, MD  DG Chest Portable 1 View  Result Date: 10/25/2020 CLINICAL DATA:  Palpitations EXAM: PORTABLE CHEST 1 VIEW COMPARISON:  10/14/2020 FINDINGS: Pacer/defibrillator projecting over the left side of the chest. Prior median sternotomy. Midline trachea. Borderline cardiomegaly. Right paratracheal soft tissue fullness corresponds to mediastinal adenopathy on prior CT. Bilateral hilar adenopathy as well. No pleural effusion or pneumothorax.  Resolved airspace disease. IMPRESSION: Thoracic adenopathy, as on recent CT. No evidence of residual airspace disease. Electronically Signed   By: Jeronimo Greaves M.D.   On: 10/25/2020 10:39   MR CARDIAC MORPHOLOGY W WO CONTRAST  Result Date: 10/17/2020 CLINICAL DATA:  Clinical question of sarcoidosis EXAM: CARDIAC MRI TECHNIQUE: The patient was scanned on a 1.5 Tesla GE magnet. A dedicated cardiac coil was used. Functional imaging was done using Fiesta sequences. 2,3, and 4 chamber views were done to assess for RWMA's. Modified Simpson's rule using a short axis stack was used to calculate an ejection fraction on a dedicated work [employer]. The patient received 10 cc of Gadavist. After 10 minutes inversion recovery sequences were used to assess for infiltration and scar tissue. CONTRAST:  10 cc  of Gadavist FINDINGS: 1. Severe left ventricular dilation, with LVEDD 70 mm, but LVEDVi 142 mL/m2. Normal left ventricular  thickness, with intraventricular septal thickness of 7 mm, posterior wall thickness of 7 mm, and septal to posterior ratio < 1.5. Severe left ventricular systolic dysfunction (LVEF = 18%). There is severe basal and mid anterior, anteroseptal, inferior, and lateral walls hypokinesis. Left ventricular parametric mapping notable for slight elevation in the native T1 signal and normal T2 signal. There is late gadolinium enhancement in the left ventricular myocardium: Patchy basal septal LGE, transmural basal inferolateral and anterolateral LGE with associated thinning that  extends into the apex. There is inferior epicardial LGE in the base. Though this could be consistent with multi-vessel disease, lack of subendocardial involvement and patchy nature would favor sarcoidosis. 2. Normal right ventricular size with RVEDVI 58 mL/m2. Normal right ventricular thickness. Moderate right ventricular systolic dysfunction (RVEF =33%). There are no regional wall motion abnormalities or aneurysms. 3.  Normal left and right atrial size. 4. Normal size of the aortic root, ascending aorta and pulmonary artery. 5.  Qualitatively there is at least moderate mitral regurgitation. 6.  Normal pericardium.  No pericardial effusion. 7. There are bilateral effusions and regions within the right pleural effusion that are isointense with myocardium. This is best seen posterior to the left atrium. Correlates to area on recent CTPE with Hounsfield units of 258. This may be consistent with patient's bulky lymphadenopathy with some associated atelectasis. Ultimately, would recommended dedicated study if additional imaging need for non-cardiac pathology. 8.  Breath hold artifact noted. 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 Non-cardiac findings discussed with primary and pulmonology teams. Riley Lam MD Electronically Signed   By: Riley Lam M.D.   On: 10/17/2020 13:39   CUP  PACEART INCLINIC DEVICE CHECK  Result Date: 11/07/2020 Wound check appointment. Steri-strips removed. Wound without redness or edema. Incision edges approximated, wound well healed. Normal device function. Thresholds, sensing, and impedances consistent with implant measurements. Device programmed at 3.5V for  extra safety margin until 3 month visit. Histogram distribution appropriate for patient and level of activity. 2 nsT episodes EGMs illustrates non-sustained VT each episode 4 seconds in duration with median V rate of 200bpm. Patient educated about wound  care, arm mobility, lifting restrictions, shock plan. 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 Currently on Prednisone 40mg  daily, Bactrim DS MWF and methotrexate.   Mediastinal lymphadenopathy -  Patient underwent video bronchoscopy with endobronchial ultrasound on 10/18/2020 with Dr. 10/20/2020. No evidence on malignancy or pulmonary sarcoid. Dr. Tonia Brooms plans to reach out to Dr. Francine Graven to see if they need help managing immunosuppression for cardiac sarcoid. Lymphadenopthy likely reactive d/t acute heart failure. There is always a possibility biopsies missed area of sarcoid involvement from lymph nodule. No follow-up needed with pulmonary unless indicated.      Gala Romney, NP 11/13/2020

## 2020-11-14 ENCOUNTER — Ambulatory Visit (HOSPITAL_COMMUNITY)
Admission: RE | Admit: 2020-11-14 | Discharge: 2020-11-14 | Disposition: A | Discharge status: Home / Self Care | Payer: 59 | Source: Ambulatory Visit | Attending: Internal Medicine | Admitting: Internal Medicine

## 2020-11-14 DIAGNOSIS — I5022 Chronic systolic (congestive) heart failure: Secondary | ICD-10-CM

## 2020-11-14 LAB — DIGOXIN LEVEL: Digoxin Level: <0.2 ng/mL — ABNORMAL LOW (ref 0.8–2.0)

## 2020-11-19 ENCOUNTER — Other Ambulatory Visit (HOSPITAL_COMMUNITY): Payer: Self-pay

## 2020-11-19 LAB — FUNGAL ORGANISM REFLEX

## 2020-11-19 LAB — FUNGUS CULTURE WITH STAIN

## 2020-11-19 LAB — FUNGUS CULTURE RESULT

## 2020-11-25 ENCOUNTER — Ambulatory Visit (HOSPITAL_COMMUNITY)
Admission: RE | Admit: 2020-11-25 | Discharge: 2020-11-25 | Disposition: A | Discharge status: Home / Self Care | Payer: 59 | Source: Ambulatory Visit | Attending: Internal Medicine | Admitting: Internal Medicine

## 2020-11-25 ENCOUNTER — Encounter (HOSPITAL_COMMUNITY): Payer: Self-pay

## 2020-11-25 ENCOUNTER — Other Ambulatory Visit: Payer: Self-pay

## 2020-11-25 VITALS — BP 114/64 | HR 72 | Wt 220.0 lb

## 2020-11-25 DIAGNOSIS — N1831 Chronic kidney disease, stage 3a: Secondary | ICD-10-CM | POA: Diagnosis not present

## 2020-11-25 DIAGNOSIS — R42 Dizziness and giddiness: Secondary | ICD-10-CM | POA: Insufficient documentation

## 2020-11-25 DIAGNOSIS — I472 Ventricular tachycardia, unspecified: Secondary | ICD-10-CM | POA: Diagnosis not present

## 2020-11-25 DIAGNOSIS — I5022 Chronic systolic (congestive) heart failure: Secondary | ICD-10-CM | POA: Diagnosis present

## 2020-11-25 DIAGNOSIS — I493 Ventricular premature depolarization: Secondary | ICD-10-CM | POA: Insufficient documentation

## 2020-11-25 DIAGNOSIS — R590 Localized enlarged lymph nodes: Secondary | ICD-10-CM | POA: Diagnosis not present

## 2020-11-25 DIAGNOSIS — D869 Sarcoidosis, unspecified: Secondary | ICD-10-CM | POA: Diagnosis not present

## 2020-11-25 DIAGNOSIS — I428 Other cardiomyopathies: Secondary | ICD-10-CM | POA: Diagnosis not present

## 2020-11-25 MED ORDER — SULFAMETHOXAZOLE-TRIMETHOPRIM 800-160 MG PO TABS: 1.0000 | ORAL_TABLET | ORAL | 5 refills | Status: DC

## 2020-11-25 MED ORDER — SPIRONOLACTONE 25 MG PO TABS: 12.5000 mg | ORAL_TABLET | Freq: Every day | ORAL | 3 refills | Status: DC

## 2020-11-25 MED ORDER — METHOTREXATE 2.5 MG PO TABS: 12.5000 mg | ORAL_TABLET | ORAL | 1 refills | Status: DC

## 2020-11-25 MED ORDER — DAPAGLIFLOZIN PROPANEDIOL 10 MG PO TABS
10.0000 mg | ORAL_TABLET | Freq: Every day | ORAL | 3 refills | Status: DC
  Filled 2021-05-28: qty 90, 90d supply, fill #0
  Filled 2021-08-23: qty 90, 90d supply, fill #1

## 2020-11-25 MED ORDER — OMEPRAZOLE 40 MG PO CPDR: 40.0000 mg | DELAYED_RELEASE_CAPSULE | Freq: Every morning | ORAL | 3 refills | Status: DC

## 2020-11-25 MED ORDER — DIGOXIN 125 MCG PO TABS
0.0625 mg | ORAL_TABLET | Freq: Every day | ORAL | 3 refills | Status: DC
  Filled 2021-05-28: qty 45, 90d supply, fill #0

## 2020-11-25 MED ORDER — METHOTREXATE 2.5 MG PO TABS: 12.5000 mg | ORAL_TABLET | ORAL | 4 refills | Status: DC

## 2020-11-25 MED ORDER — MIDODRINE HCL 5 MG PO TABS: 5.0000 mg | ORAL_TABLET | Freq: Three times a day (TID) | ORAL | 1 refills | Status: DC

## 2020-11-25 MED ORDER — AMIODARONE HCL 200 MG PO TABS: 200.0000 mg | ORAL_TABLET | Freq: Every day | ORAL | 3 refills | Status: DC

## 2020-11-25 MED ORDER — PREDNISONE 10 MG PO TABS: 30.0000 mg | ORAL_TABLET | Freq: Every day | ORAL | 1 refills | Status: DC

## 2020-11-25 NOTE — Patient Instructions (Addendum)
It was a pleasure seeing you today!  MEDICATIONS: -We are changing your medications today -Decrease midodrine to 5 mg (1 tablet) three times daily.  -Increase methotrexate to 12.5 mg (5 tablets) every Wednesday. -Decrease prednisone to 30 mg (3 tablets) daily. -Call if you have questions about your medications.   NEXT APPOINTMENT: Return to clinic in 3 weeks with [facility].  In general, to take care of your heart failure: -Limit your fluid intake to 2 Liters (half-gallon) per day.   -Limit your salt intake to ideally 2-3 grams (2000-3000 mg) per day. -Weigh yourself daily and record, and bring that "weight diary" to your next appointment.  (Weight gain of 2-3 pounds in 1 day typically means fluid weight.) -The medications for your heart are to help your heart and help you live longer.   -Please contact us before stopping any of your heart medications.  Call the clinic at [phone] with questions or to reschedule future appointments.

## 2020-11-26 NOTE — Telephone Encounter (Signed)
Yes, he needs the [place] copay card.

## 2020-12-03 LAB — ACID FAST CULTURE WITH REFLEXED SENSITIVITIES (MYCOBACTERIA)
Acid Fast Culture: NEGATIVE
Acid Fast Culture: NEGATIVE

## 2020-12-16 ENCOUNTER — Encounter (HOSPITAL_COMMUNITY): Payer: Self-pay

## 2020-12-16 ENCOUNTER — Ambulatory Visit (HOSPITAL_COMMUNITY)
Admission: RE | Admit: 2020-12-16 | Discharge: 2020-12-16 | Disposition: A | Discharge status: Home / Self Care | Payer: 59 | Source: Ambulatory Visit | Attending: Cardiology | Admitting: Cardiology

## 2020-12-16 VITALS — BP 90/66 | HR 87 | Wt 224.0 lb

## 2020-12-16 DIAGNOSIS — Z95810 Presence of automatic (implantable) cardiac defibrillator: Secondary | ICD-10-CM | POA: Insufficient documentation

## 2020-12-16 DIAGNOSIS — N1831 Chronic kidney disease, stage 3a: Secondary | ICD-10-CM

## 2020-12-16 DIAGNOSIS — R21 Rash and other nonspecific skin eruption: Secondary | ICD-10-CM | POA: Diagnosis not present

## 2020-12-16 DIAGNOSIS — D8685 Sarcoid myocarditis: Secondary | ICD-10-CM

## 2020-12-16 DIAGNOSIS — Z87891 Personal history of nicotine dependence: Secondary | ICD-10-CM | POA: Insufficient documentation

## 2020-12-16 DIAGNOSIS — I472 Ventricular tachycardia, unspecified: Secondary | ICD-10-CM | POA: Insufficient documentation

## 2020-12-16 DIAGNOSIS — D8689 Sarcoidosis of other sites: Secondary | ICD-10-CM | POA: Insufficient documentation

## 2020-12-16 DIAGNOSIS — Z7984 Long term (current) use of oral hypoglycemic drugs: Secondary | ICD-10-CM | POA: Insufficient documentation

## 2020-12-16 DIAGNOSIS — I428 Other cardiomyopathies: Secondary | ICD-10-CM | POA: Insufficient documentation

## 2020-12-16 DIAGNOSIS — I272 Pulmonary hypertension, unspecified: Secondary | ICD-10-CM | POA: Diagnosis not present

## 2020-12-16 DIAGNOSIS — Z792 Long term (current) use of antibiotics: Secondary | ICD-10-CM | POA: Insufficient documentation

## 2020-12-16 DIAGNOSIS — H30033 Focal chorioretinal inflammation, peripheral, bilateral: Secondary | ICD-10-CM | POA: Diagnosis not present

## 2020-12-16 DIAGNOSIS — I4729 Other ventricular tachycardia: Secondary | ICD-10-CM | POA: Diagnosis not present

## 2020-12-16 DIAGNOSIS — I5022 Chronic systolic (congestive) heart failure: Secondary | ICD-10-CM | POA: Diagnosis not present

## 2020-12-16 DIAGNOSIS — I493 Ventricular premature depolarization: Secondary | ICD-10-CM | POA: Diagnosis not present

## 2020-12-16 DIAGNOSIS — Z79631 Long term (current) use of antimetabolite agent: Secondary | ICD-10-CM | POA: Insufficient documentation

## 2020-12-16 DIAGNOSIS — T380X5A Adverse effect of glucocorticoids and synthetic analogues, initial encounter: Secondary | ICD-10-CM | POA: Insufficient documentation

## 2020-12-16 DIAGNOSIS — Z7952 Long term (current) use of systemic steroids: Secondary | ICD-10-CM | POA: Insufficient documentation

## 2020-12-16 DIAGNOSIS — Z79899 Other long term (current) drug therapy: Secondary | ICD-10-CM | POA: Insufficient documentation

## 2020-12-16 DIAGNOSIS — I340 Nonrheumatic mitral (valve) insufficiency: Secondary | ICD-10-CM | POA: Insufficient documentation

## 2020-12-16 DIAGNOSIS — R590 Localized enlarged lymph nodes: Secondary | ICD-10-CM | POA: Diagnosis not present

## 2020-12-16 LAB — CBC WITH DIFFERENTIAL/PLATELET
Abs Immature Granulocytes: 0.52 10*3/uL — ABNORMAL HIGH (ref 0.00–0.07)
Basophils Absolute: 0 10*3/uL (ref 0.0–0.1)
Basophils Relative: 0 %
Eosinophils Absolute: 0.1 10*3/uL (ref 0.0–0.5)
Eosinophils Relative: 1 %
HCT: 44 % (ref 39.0–52.0)
Hemoglobin: 13.9 g/dL (ref 13.0–17.0)
Immature Granulocytes: 5 %
Lymphocytes Relative: 17 %
Lymphs Abs: 1.8 10*3/uL (ref 0.7–4.0)
MCH: 29.6 pg (ref 26.0–34.0)
MCHC: 31.6 g/dL (ref 30.0–36.0)
MCV: 93.6 fL (ref 80.0–100.0)
Monocytes Absolute: 1 10*3/uL (ref 0.1–1.0)
Monocytes Relative: 10 %
Neutro Abs: 7 10*3/uL (ref 1.7–7.7)
Neutrophils Relative %: 67 %
Platelets: 112 10*3/uL — ABNORMAL LOW (ref 150–400)
RBC: 4.7 MIL/uL (ref 4.22–5.81)
RDW: 20.3 % — ABNORMAL HIGH (ref 11.5–15.5)
WBC: 10.4 10*3/uL (ref 4.0–10.5)
nRBC: 0 % (ref 0.0–0.2)

## 2020-12-16 LAB — COMPREHENSIVE METABOLIC PANEL
ALT: 24 U/L (ref 0–44)
AST: 21 U/L (ref 15–41)
Albumin: 3.3 g/dL — ABNORMAL LOW (ref 3.5–5.0)
Alkaline Phosphatase: 33 U/L — ABNORMAL LOW (ref 38–126)
Anion gap: 8 (ref 5–15)
BUN: 36 mg/dL — ABNORMAL HIGH (ref 6–20)
CO2: 24 mmol/L (ref 22–32)
Calcium: 9.1 mg/dL (ref 8.9–10.3)
Chloride: 106 mmol/L (ref 98–111)
Creatinine, Ser: 1.75 mg/dL — ABNORMAL HIGH (ref 0.61–1.24)
GFR, Estimated: 46 mL/min — ABNORMAL LOW (ref >60)
Glucose, Bld: 92 mg/dL (ref 70–99)
Potassium: 5 mmol/L (ref 3.5–5.1)
Sodium: 138 mmol/L (ref 135–145)
Total Bilirubin: 0.5 mg/dL (ref 0.3–1.2)
Total Protein: 6.2 g/dL — ABNORMAL LOW (ref 6.5–8.1)

## 2020-12-16 LAB — DIGOXIN LEVEL: Digoxin Level: 0.5 ng/mL — ABNORMAL LOW (ref 0.8–2.0)

## 2020-12-16 MED ORDER — MIDODRINE HCL 5 MG PO TABS: 2.5000 mg | ORAL_TABLET | Freq: Three times a day (TID) | ORAL | 1 refills | Status: DC

## 2020-12-16 MED ORDER — METHOTREXATE 2.5 MG PO TABS: 15.0000 mg | ORAL_TABLET | ORAL | 3 refills | Status: DC

## 2020-12-16 MED ORDER — PREDNISONE 10 MG PO TABS: 25.0000 mg | ORAL_TABLET | Freq: Every day | ORAL | 1 refills | Status: DC

## 2020-12-16 NOTE — Progress Notes (Addendum)
Advanced Heart Failure Clinic Note   PCP: Dois Davenport, MD PCP-Cardiologist: Chilton Si, MD  HF Cardiologist: Dr. Gala Romney  HPI: [name] is a 55 y.o. male with history of chronic systolic heart failure, mitral regurgitation, VT s/p ICD, cardiac sarcoidosis and peripheral focal chorioretinal inflammation of both eyes diagnosed in 2020 (followed at [facility] ophthalmology).     Admitted 10/12/20 with increased shortness of breath. CT chest with bulky mediastinal lymphadenopathy. Pulmonary consulted, underwent bronchoscopy + biopsy. Path showed no malignancy. Echo showed EF 25 % with reduced RV. Underwent R/LHC with normal cors and stable hemodynamics. cMRI that showed EF 18% with LGE pattern consistent with cardiac sarcoidosis. GMDT limited by hypotension. He was on 1/2 tab Bidil + [place]. Placed on prednisone, metrotrexate, Bactrim for PJP. Had NSVT and high PVC burden and started on mexilitene 200 mg bid. Discharged with Life Vest due to frequent PVCs.   Re-admitted 10/25/20 after LifeVest alarmed several times. Had frequent runs of NSVT and > 25% PVC burden. He was placed on IV amiodarone with some suppression of PVCs. Mexitil was discontinued and IV amiodarone started, transitioned eventually  to PO. EP consulted and d/t extensive transmural LGE and severely reduced LVEF, ICD placed for primary prevention. Had hypotension with amiodarone gtt and was started on midodrine. GDMT limited by hypotension. Discharge weight 211-215 lbs.  Today he returns for HF follow up with his wife. He continues to walk in his neighborhood with his wife, has gotten up to 2.5 miles, no SOB with this. Feels palpitations some. Overall feeling fine. Denies CP, dizziness, edema, or PND/Orthopnea. Appetite ok. No fever or chills. Weight at home 224 pounds. Taking all medications. Bp checks at home ~ 100-118/70s. Continues with rash on trunk, not itchy, no changes to detergents/soaps, or new pets in the house.  Has been on cetirizine and using hydrocortisone cream. Appeared day after he was discharged from hospital.   Cardiac Studies: - Echo (9/22): EF 25%, reduced RV.  - cMRI (9/22): EF 18% with LGE pattern consistent with cardiac sarcoidosis.  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 PAP 46/19 mm with a mean of 33 mmHg.   PCWP 27 mmHg and LVEDP 30 mmHg.  In the setting of systolic pressures 90/20 mmHg Cardiac Output (Fick) 6.75-3.06.-Index SVR 10.96, PVR 0.9 (Wood units)  Past Medical History:  Diagnosis Date   Allergies    09/16/2020   BPH (benign prostatic hyperplasia)    ED (erectile dysfunction)    GERD (gastroesophageal reflux disease)    Peripheral focal chorioretinal inflammation of both eyes    Retinal vasculitis of both eyes    Current Outpatient Medications  Medication Sig Dispense Refill   amiodarone (PACERONE) 200 MG tablet Take 1 tablet (200 mg total) by mouth daily. 90 tablet 3   Blood Pressure Monitoring (OMRON 3 SERIES BP MONITOR) DEVI Use as directed 1 each 0   Calcium Carbonate-Vitamin D3 (CALCIUM 600+D) 600-400 MG-UNIT TABS Take 1 tablet by mouth 2 (two) times daily. 180 tablet 3   cetirizine (ZYRTEC) 10 MG tablet Take 10 mg by mouth daily.     dapagliflozin propanediol (FARXIGA) 10 MG TABS tablet Take 1 tablet (10 mg total) by mouth daily. 90 tablet 3   digoxin (LANOXIN) 0.125 MG tablet Take 0.5 tablets (0.0625 mg total) by mouth daily. 45 tablet 3   feeding supplement (ENSURE ENLIVE / ENSURE PLUS) LIQD Take 237 mLs by mouth 2 (  two) times daily between meals. 123XX123 mL 12   folic acid (FOLVITE) 1 MG tablet Take 1 tablet (1 mg total) by mouth daily. 30 tablet 11   methotrexate (RHEUMATREX) 2.5 MG tablet Take 5 tablets (12.5 mg total) by mouth every Wednesday. Caution:Chemotherapy. Protect from light. 20 tablet 1   midodrine (PROAMATINE) 5 MG tablet Take 1 tablet (5 mg total) by mouth 3  (three) times daily with meals. 270 tablet 1   Multiple Vitamins-Minerals (CERTAVITE/ANTIOXIDANTS) TABS Take 1 tablet by mouth daily. 30 tablet 0   omeprazole (PRILOSEC) 40 MG capsule Take 1 capsule (40 mg total) by mouth every morning. 90 capsule 3   predniSONE (DELTASONE) 10 MG tablet Take 3 tablets (30 mg total) by mouth daily with breakfast. 90 tablet 1   spironolactone (ALDACTONE) 25 MG tablet Take 1/2 tablet (12.5 mg total) by mouth daily. 45 tablet 3   sulfamethoxazole-trimethoprim (BACTRIM DS) 800-160 MG tablet Take 1 tablet by mouth every Monday, Wednesday, and Friday. 15 tablet 5   tamsulosin (FLOMAX) 0.4 MG CAPS capsule Take 2 capsules (0.8 mg total) by mouth daily. 60 capsule 0   No current facility-administered medications for this encounter.   No Known Allergies  Social History   Socioeconomic History   Marital status: Married    Spouse name: Not on file   Number of children: Not on file   Years of education: Not on file   Highest education level: Not on file  Occupational History   Not on file  Tobacco Use   Smoking status: Former    Packs/day: 0.50    Types: Cigarettes    Quit date: 02/02/2010    Years since quitting: 10.8   Smokeless tobacco: Never  Substance and Sexual Activity   Alcohol use: Yes   Drug use: Not Currently   Sexual activity: Not on file  Other Topics Concern   Not on file  Social History Narrative   Not on file   Social Determinants of Health   Financial Resource Strain: Low Risk    Difficulty of Paying Living Expenses: Not very hard  Food Insecurity: No Food Insecurity   Worried About Running Out of Food in the Last Year: Never true   Ran Out of Food in the Last Year: Never true  Transportation Needs: No Transportation Needs   Lack of Transportation (Medical): No   Lack of Transportation (Non-Medical): No  Physical Activity: Not on file  Stress: Not on file  Social Connections: Not on file  Intimate Partner Violence: Not on file    Family History  Problem Relation Age of Onset   Cancer Father 79   Diabetes Father    BP 90/66   Pulse 87   Wt 101.6 kg   SpO2 100%   BMI 32.14 kg/m   Wt Readings from Last 3 Encounters:  12/16/20 101.6 kg  11/25/20 99.8 kg  11/13/20 97.1 kg   PHYSICAL EXAM: General:  NAD. No resp difficulty HEENT: Normal Neck: Supple. No JVD. Carotids 2+ bilat; no bruits. No lymphadenopathy or thryomegaly appreciated. Cor: PMI nondisplaced. Regular rate & rhythm. No rubs, gallops or murmurs. Lungs: Clear Abdomen: Obese, nontender, nondistended. No hepatosplenomegaly. No bruits or masses. Good bowel sounds. + small inflammatory papules on trunk and back, no drainage. Extremities: No cyanosis, clubbing, rash, edema Neuro: Alert & oriented x 3, cranial nerves grossly intact. Moves all 4 extremities w/o difficulty. Affect pleasant.  ECG: SR with PVCs 94 bpm (personally reviewed).  ASSESSMENT & PLAN: NSVT/  Frequent PVCs  - Frequent runs of NSVT/PVC and >25% burden. Previously on mexilitene 200 mg bid.  - S/p Biotronik ICD placement 10/28/20.  - SR on ECG today with PVCs. - Continue amiodarone 200 mg daily.  - Follow up with EP next month w/ Dr. Quentin Ore   2. Cardiac Sarcoid - cMRI 10/17/20 c/w cardiac sarcoid. - Decrease prednisone to 25 mg daily and increase methotrexate to 15 mg every Wednesday per pharmacy recommendations/sarcoid protocol. - Continue folic acid, Vit D + calcium and PPI. - Continue bactrim DS M-W-F for PJP prophylaxis. - S/p ICD placement 09/26. - ? Cardiac PET - CMET, CBC today w/ diff today per protocol - Next visit, decrease pred to 20 mg daily and increase MTX to 17.5 mg every Wednesday. - Echo when off prednisone and on goal dose of MTX.   3. 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 - NYHA II. Does not appear volume overloaded.  He is not on daily loop diuretic. - GDMT titration limited today by BP. - Decrease midodrine to 2.5 mg tid. Check BP at home, if sBP consistently < 90, OK to go back up to 5 mg tid. - Continue Farxiga 10 mg daily. - Continue sprionolactone 12.5 mg daily. - Continue digoxin 0.125 mg daily.  - Plan to wean down midodrine and add beta blocker + ARN/ARNi as able. - Dig level today.  4. CKD Stage IIIa - SCr baseline 1.4-1.6 - Labs today.    5. Mediastinal Lymphadenopathy - 10/17/20 bronchoscopy + biopsy.  - Path showed no malignancy or evidence of sarcoid.  6. Papular rash - Looks like a folliculitis, ? Steroid induced. - Discussed with PharmD, could be related to MTX. Does not look like SJS or amio rash, unlikely related to Bactrim. - Continue cetirizine + hydrocortisone cream. - Try OTC famotidine 20 mg daily. - Likely needs skin scraping to determine diagnosis. - Discussed with Dr. Haroldine Laws, will refer to Dermatology Dr. Delman Cheadle.  Follow up w/ PharmD in 3-4 weeks for further MTX/pred titration and 6-8 weeks with Dr. Haroldine Laws + echo.  [place], FNP 12/16/20

## 2020-12-16 NOTE — Addendum Note (Signed)
Encounter addended by: Demetrius Charity, RN on: 12/16/2020 12:38 PM  Actions taken: Order list changed, Diagnosis association updated

## 2020-12-16 NOTE — Addendum Note (Signed)
Encounter addended by: Jacklynn Ganong, FNP on: 12/16/2020 12:09 PM  Actions taken: Clinical Note Signed

## 2020-12-16 NOTE — Patient Instructions (Addendum)
EKG was done today  Labs were done today, if any labs are abnormal the clinic will call you  DECREASE Prednisone to 25 mg  2 1/2 tablets daily   INCREASE Methotrexate to 15 mg 6 tablets every Wednesday  Decrease Midodrine to 2.5 mg three times daily if blood pressure is greater than 90 increase back to 5 mg three times daily   Your physician recommends that you schedule a follow-up appointment in: 6 weeks with echocardiogram   For your rash take zyrtec 10 mg and pepcid 20 mg   At the [facility], you and your health needs are our priority. As part of our continuing mission to provide you with exceptional heart care, we have created designated Provider Care Teams. These Care Teams include your primary Cardiologist (physician) and Advanced Practice Providers (APPs- Physician Assistants and Nurse Practitioners) who all work together to provide you with the care you need, when you need it.   You may see any of the following providers on your designated Care Team at your next follow up: Dr Arvilla Meres Dr Carron Curie, NP Robbie Lis, [place] [facility] [place], [place] Karle Plumber, PharmD   Please be sure to bring in all your medications bottles to every appointment.   If you have any questions or concerns before your next appointment please send [place] a message through [place] or call our office at [phone].    TO LEAVE A MESSAGE FOR THE NURSE SELECT OPTION 2, PLEASE LEAVE A MESSAGE INCLUDING: YOUR NAME DATE OF BIRTH CALL BACK NUMBER REASON FOR CALL**this is important as we prioritize the call backs  YOU WILL RECEIVE A CALL BACK THE SAME DAY AS LONG AS YOU CALL BEFORE 4:00 PM

## 2020-12-17 NOTE — Addendum Note (Signed)
Encounter addended by: Jacklynn Ganong, FNP on: 12/17/2020 8:26 AM  Actions taken: Clinical Note Signed

## 2020-12-20 ENCOUNTER — Telehealth (HOSPITAL_COMMUNITY): Payer: Self-pay

## 2020-12-20 DIAGNOSIS — I5022 Chronic systolic (congestive) heart failure: Secondary | ICD-10-CM

## 2020-12-20 NOTE — Telephone Encounter (Signed)
Pt aware of results. Will rtc on 11/22 for repeat cbc

## 2020-12-20 NOTE — Telephone Encounter (Signed)
-----   Message from Jacklynn Ganong, [place] sent at 12/17/2020  5:27 PM EST ----- Platelets are low. Discussed with Dr. Gala Romney, repeat CBC in 1 week.

## 2020-12-24 ENCOUNTER — Ambulatory Visit (HOSPITAL_COMMUNITY)
Admission: RE | Admit: 2020-12-24 | Discharge: 2020-12-24 | Disposition: A | Discharge status: Home / Self Care | Payer: 59 | Source: Ambulatory Visit | Attending: Family Medicine | Admitting: Family Medicine

## 2020-12-24 DIAGNOSIS — I5022 Chronic systolic (congestive) heart failure: Secondary | ICD-10-CM | POA: Diagnosis present

## 2020-12-24 LAB — CBC
HCT: 41.8 % (ref 39.0–52.0)
Hemoglobin: 13.1 g/dL (ref 13.0–17.0)
MCH: 29.3 pg (ref 26.0–34.0)
MCHC: 31.3 g/dL (ref 30.0–36.0)
MCV: 93.5 fL (ref 80.0–100.0)
Platelets: 118 10*3/uL — ABNORMAL LOW (ref 150–400)
RBC: 4.47 MIL/uL (ref 4.22–5.81)
RDW: 20.4 % — ABNORMAL HIGH (ref 11.5–15.5)
WBC: 9.9 10*3/uL (ref 4.0–10.5)
nRBC: 0.2 % (ref 0.0–0.2)

## 2020-12-25 ENCOUNTER — Other Ambulatory Visit (HOSPITAL_COMMUNITY): Payer: Self-pay | Admitting: Surgery

## 2020-12-25 DIAGNOSIS — D8685 Sarcoid myocarditis: Secondary | ICD-10-CM

## 2020-12-25 DIAGNOSIS — D869 Sarcoidosis, unspecified: Secondary | ICD-10-CM

## 2020-12-31 NOTE — Progress Notes (Signed)
PCP: Nathan Rasmussen, MD PCP-Cardiologist: Nathan Latch, MD  HF Cardiologist: Dr. Haroldine [name]  HPI:  [name] is a 55 y.o. male with history of chronic systolic heart failure, mitral regurgitation, VT s/p ICD, cardiac sarcoidosis and peripheral focal chorioretinal inflammation of both eyes diagnosed in 2020 (followed at [facility] ophthalmology).     Admitted 10/12/20 with increased shortness of breath. CT chest with bulky mediastinal lymphadenopathy. Pulmonary consulted, underwent bronchoscopy + biopsy. Path showed no malignancy. Echo showed EF 25% with reduced RV. Underwent R/LHC with normal cors and stable hemodynamics. cMRI that showed EF 18% with LGE pattern consistent with cardiac sarcoidosis. GMDT limited by hypotension. He was on 1/2 tab Bidil + [place]. Placed on prednisone, methotrexate, Bactrim for PJP. Had NSVT and high PVC burden and started on mexiletine 200 mg BID. Discharged with Life Vest due to frequent PVCs.   Re-admitted 10/25/20 after LifeVest alarmed several times. Had frequent runs of NSVT and > 25% PVC burden. He was placed on IV amiodarone with some suppression of PVCs. Mexiletine was discontinued and IV amiodarone started, transitioned eventually to PO. EP consulted and d/t extensive transmural LGE and severely reduced LVEF, ICD placed for primary prevention. Had hypotension with amiodarone gtt and was started on midodrine. GDMT limited by hypotension. Discharge weight 211-215 lbs.   Recently presented to [facility] for follow up with his wife on 12/16/20. He continued to walk in his neighborhood with his wife, had gotten up to 2.5 miles, no SOB with this. Felt palpitations some. Overall was feeling fine. Denied CP, dizziness, edema, or PND/Orthopnea. Appetite was ok. No fever or chills. Weight at home was 224 pounds. Reported taking all medications. BP checks at home ~ 100-118/70s. Noted rash on trunk, not itchy, no changes to detergents/soaps, or new pets in the house. Had been  on cetirizine and using hydrocortisone cream. Appeared day after he was discharged from hospital.  Today he returns to HF clinic for pharmacist medication titration. At last visit with APP, prednisone was decreased to 25 mg daily and methotrexate was increased to 15 mg every Wednesday. Midodrine was decreased to 2.5 mg TID. Labs returned and platelets were low at 112, 118 on repeat. He was referred to Dr. Amil [name] (rheumatology) to assess if this is due to his sarcoidosis treatment. Was also referred to Dermatology with Dr. Delman [name] to assess papular rash. He has not seen either Dermatology or Rheumatology yet. Of note, he has been taking methotrexate 12.5 mg weekly instead of 15 mg weekly because he didn't realize it had been changed (medication list updated to reflect current dose). He has correctly been taking prednisone 25 mg daily. Overall he is feeling well today. No dizziness or lightheadedness. Has only been taking midodrine 2.5 mg BID. States home SBP have been 99-118. No SOB/DOE. Activity has been limited by weight gain, which he attributes to appetite increases due to prednisone. Has gained ~17 lbs in 1 month. Weight on home scale this morning was 238 lbs. No LEE, PND or orthopnea. Still has rash on trunk but states it is no longer itchy.     HF Medications: Farxiga 10 mg daily Spironolactone 12.5 mg daily Digoxin 0.0625 mg daily  Has the patient been experiencing any side effects to the medications prescribed?  no  Does the patient have any problems obtaining medications due to transportation or finances?   Has [employer] commercial insurance  Understanding of regimen: good Understanding of indications: good Potential of compliance: good Patient understands to avoid  NSAIDs. Patient understands to avoid decongestants.    Pertinent Lab Values: 11/04/20: Serum creatinine 1.58, BUN 29, Potassium 4.3, Sodium 136, Magnesium 2.3, Digoxin <0.2 ng/mL (11/14/20)  12/16/20: Serum  creatinine 1.75, BUN 36, Potassium 5.0, Sodium 138, Digoxin 0.5 ng/mL, HgB 13.9, PLT 112 (repeat 118).    Vital Signs:  Weight: 241.6 lbs (last clinic weight: 224 lbs) Blood pressure: 114/68  Heart rate: 97   Assessment/Plan: NSVT/ Frequent PVCs  - Frequent runs of NSVT/PVC and >25% burden. Previously on mexiletine 200 mg BID.  - S/p Biotronik ICD placement 10/28/20.  - SR on ECG 12/16/20 with PVCs. - Continue amiodarone 200 mg daily.  - Follow up with EP 01/30/21 w/ Dr. Lalla [name]   2. Cardiac Sarcoid - cMRI 10/17/20 c/w cardiac sarcoid. -CMET and CBD+diff pending.  - Continue Methotrexate 12.5 mg every Wednesday and prednisone 25 mg daily for now. Will consider making changes per Cardiac Sarcoid protocol pending labs today.  -Weight has increased significantly while on prednisone (~17 lbs in 1 month). Encouraged diet and exercise.  - Continue PPI while on prednisone. - Continue folic acid, Vit D + calcium. - Continue bactrim DS M-W-F for PJP prophylaxis while prednisone is >15 mg daily.  - S/p ICD placement 10/28/20. - Echo when off prednisone and on goal dose of MTX. - He will call Rheumatology and Dermatology offices to hopefully expedite appointment scheduling. Referrals have already been placed.    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 Does not appear volume overloaded. Weight gain likely due to prednisone.   - GDMT titration has been limited by BP - He is not on daily loop diuretic. - Continue midodrine 2.5 mg BID.  - Continue spironolactone 12.5 mg daily. - Continue Farxiga 10 mg daily. - Continue digoxin 0.0625 mg daily.  - Plan to wean down midodrine and add beta blocker + ARN/ARNi as able.   4. CKD Stage IIIa - SCr baseline 1.4-1.6 - CMET today pending.    5. Mediastinal Lymphadenopathy - 10/17/20 bronchoscopy + biopsy.  - Path showed no malignancy or evidence of sarcoid.  6. Papular rash  - Looks like a folliculitis, ? Steroid induced. - Could be  related to MTX. Does not look like SJS or amio rash, unlikely related to Bactrim. - Continue cetirizine + hydrocortisone cream PRN. - Likely needs skin scraping to determine diagnosis. - Previously referred to Dermatology with Dr. Emily Filbert.  Follow up 02/07/21 with Dr. Gala Romney.    Karle Plumber, PharmD, BCPS, BCCP, CPP [facility] Pharmacist [phone]

## 2021-01-08 ENCOUNTER — Ambulatory Visit (HOSPITAL_COMMUNITY)
Admission: RE | Admit: 2021-01-08 | Discharge: 2021-01-08 | Disposition: A | Discharge status: Home / Self Care | Payer: 59 | Source: Ambulatory Visit | Attending: Cardiology | Admitting: Cardiology

## 2021-01-08 ENCOUNTER — Telehealth (HOSPITAL_COMMUNITY): Payer: Self-pay | Admitting: Pharmacist

## 2021-01-08 ENCOUNTER — Other Ambulatory Visit: Payer: Self-pay

## 2021-01-08 VITALS — BP 114/68 | HR 97 | Wt 241.6 lb

## 2021-01-08 DIAGNOSIS — N1831 Chronic kidney disease, stage 3a: Secondary | ICD-10-CM | POA: Diagnosis not present

## 2021-01-08 DIAGNOSIS — I5022 Chronic systolic (congestive) heart failure: Secondary | ICD-10-CM | POA: Diagnosis not present

## 2021-01-08 DIAGNOSIS — R590 Localized enlarged lymph nodes: Secondary | ICD-10-CM | POA: Insufficient documentation

## 2021-01-08 DIAGNOSIS — D8685 Sarcoid myocarditis: Secondary | ICD-10-CM

## 2021-01-08 DIAGNOSIS — Z95810 Presence of automatic (implantable) cardiac defibrillator: Secondary | ICD-10-CM | POA: Diagnosis not present

## 2021-01-08 DIAGNOSIS — R21 Rash and other nonspecific skin eruption: Secondary | ICD-10-CM | POA: Diagnosis not present

## 2021-01-08 DIAGNOSIS — I471 Supraventricular tachycardia: Secondary | ICD-10-CM | POA: Diagnosis not present

## 2021-01-08 LAB — CBC WITH DIFFERENTIAL/PLATELET
Abs Immature Granulocytes: 0.64 10*3/uL — ABNORMAL HIGH (ref 0.00–0.07)
Basophils Absolute: 0 10*3/uL (ref 0.0–0.1)
Basophils Relative: 0 %
Eosinophils Absolute: 0.1 10*3/uL (ref 0.0–0.5)
Eosinophils Relative: 1 %
HCT: 41.1 % (ref 39.0–52.0)
Hemoglobin: 13 g/dL (ref 13.0–17.0)
Immature Granulocytes: 6 %
Lymphocytes Relative: 19 %
Lymphs Abs: 2 10*3/uL (ref 0.7–4.0)
MCH: 30 pg (ref 26.0–34.0)
MCHC: 31.6 g/dL (ref 30.0–36.0)
MCV: 94.9 fL (ref 80.0–100.0)
Monocytes Absolute: 1.1 10*3/uL — ABNORMAL HIGH (ref 0.1–1.0)
Monocytes Relative: 11 %
Neutro Abs: 6.5 10*3/uL (ref 1.7–7.7)
Neutrophils Relative %: 63 %
Platelets: 117 10*3/uL — ABNORMAL LOW (ref 150–400)
RBC: 4.33 MIL/uL (ref 4.22–5.81)
RDW: 19.6 % — ABNORMAL HIGH (ref 11.5–15.5)
WBC: 10.4 10*3/uL (ref 4.0–10.5)
nRBC: 0.4 % — ABNORMAL HIGH (ref 0.0–0.2)

## 2021-01-08 LAB — COMPREHENSIVE METABOLIC PANEL
ALT: 23 U/L (ref 0–44)
AST: 19 U/L (ref 15–41)
Albumin: 3.3 g/dL — ABNORMAL LOW (ref 3.5–5.0)
Alkaline Phosphatase: 28 U/L — ABNORMAL LOW (ref 38–126)
Anion gap: 7 (ref 5–15)
BUN: 31 mg/dL — ABNORMAL HIGH (ref 6–20)
CO2: 25 mmol/L (ref 22–32)
Calcium: 8.8 mg/dL — ABNORMAL LOW (ref 8.9–10.3)
Chloride: 107 mmol/L (ref 98–111)
Creatinine, Ser: 1.75 mg/dL — ABNORMAL HIGH (ref 0.61–1.24)
GFR, Estimated: 46 mL/min — ABNORMAL LOW (ref >60)
Glucose, Bld: 91 mg/dL (ref 70–99)
Potassium: 4.1 mmol/L (ref 3.5–5.1)
Sodium: 139 mmol/L (ref 135–145)
Total Bilirubin: 0.5 mg/dL (ref 0.3–1.2)
Total Protein: 5.8 g/dL — ABNORMAL LOW (ref 6.5–8.1)

## 2021-01-08 MED ORDER — METHOTREXATE 2.5 MG PO TABS: 12.5000 mg | ORAL_TABLET | ORAL | 3 refills | Status: DC

## 2021-01-08 MED ORDER — PREDNISONE 10 MG PO TABS: ORAL_TABLET | ORAL | 1 refills | Status: DC

## 2021-01-08 NOTE — Patient Instructions (Addendum)
It was a pleasure seeing you today!  MEDICATIONS: -No medication changes today. Continue Methotrexate 5 tablets every Wednesday and Prednisone 25 mg daily.  -Please call [facility] to schedule appointment with Dr. Dierdre Forth: [phone] -Please call [facility] to schedule appointment with Dr. Emily Filbert: [phone] -Call if you have questions about your medications.  LABS: -We will call you if your labs need attention.  NEXT APPOINTMENT: Return to clinic 02/07/21 with Dr. Gala Romney.  In general, to take care of your heart failure: -Limit your fluid intake to 2 Liters (half-gallon) per day.   -Limit your salt intake to ideally 2-3 grams (2000-3000 mg) per day. -Weigh yourself daily and record, and bring that "weight diary" to your next appointment.  (Weight gain of 2-3 pounds in 1 day typically means fluid weight.) -The medications for your heart are to help your heart and help you live longer.   -Please contact us before stopping any of your heart medications.  Call the clinic at [phone] with questions or to reschedule future appointments.

## 2021-01-08 NOTE — Telephone Encounter (Signed)
Reviewed labs with Dr. Gala Romney. Platelets low but stable at 117. Will continue methotrexate 12.5 mg every Wednesday. Will decrease prednisone to 20 mg (2 tablets) daily for 2 weeks, then decrease to 15 mg (1.5 tablets) daily. Will repeat CBC+diff/CMET on 01/22/21. Patient has follow up scheduled with Dr. Gala Romney on 02/07/21. Patient aware and expressed understanding.    Karle Plumber, PharmD, BCPS, BCCP, CPP [facility] Pharmacist [phone]

## 2021-01-13 ENCOUNTER — Emergency Department (HOSPITAL_COMMUNITY): Payer: Medicaid Other

## 2021-01-13 ENCOUNTER — Encounter (HOSPITAL_COMMUNITY): Payer: Self-pay

## 2021-01-13 ENCOUNTER — Other Ambulatory Visit: Payer: Self-pay

## 2021-01-13 ENCOUNTER — Emergency Department (HOSPITAL_COMMUNITY)
Admission: EM | Admit: 2021-01-13 | Discharge: 2021-01-14 | Disposition: A | Discharge status: Home / Self Care | Payer: Medicaid Other | Attending: Emergency Medicine | Admitting: Emergency Medicine

## 2021-01-13 DIAGNOSIS — K0889 Other specified disorders of teeth and supporting structures: Secondary | ICD-10-CM | POA: Diagnosis present

## 2021-01-13 DIAGNOSIS — K029 Dental caries, unspecified: Secondary | ICD-10-CM | POA: Diagnosis not present

## 2021-01-13 DIAGNOSIS — K047 Periapical abscess without sinus: Secondary | ICD-10-CM | POA: Insufficient documentation

## 2021-01-13 DIAGNOSIS — F17290 Nicotine dependence, other tobacco product, uncomplicated: Secondary | ICD-10-CM | POA: Insufficient documentation

## 2021-01-13 LAB — BASIC METABOLIC PANEL
Anion gap: 7 (ref 5–15)
BUN: 12 mg/dL (ref 6–20)
CO2: 25 mmol/L (ref 22–32)
Calcium: 9 mg/dL (ref 8.9–10.3)
Chloride: 108 mmol/L (ref 98–111)
Creatinine, Ser: 0.91 mg/dL (ref 0.61–1.24)
GFR, Estimated: >60 mL/min (ref >60)
Glucose, Bld: 116 mg/dL — ABNORMAL HIGH (ref 70–99)
Potassium: 4.1 mmol/L (ref 3.5–5.1)
Sodium: 140 mmol/L (ref 135–145)

## 2021-01-13 LAB — CBC WITH DIFFERENTIAL/PLATELET
Abs Immature Granulocytes: 0.01 10*3/uL (ref 0.00–0.07)
Basophils Absolute: 0 10*3/uL (ref 0.0–0.1)
Basophils Relative: 0 %
Eosinophils Absolute: 0.1 10*3/uL (ref 0.0–0.5)
Eosinophils Relative: 1 %
HCT: 46.4 % (ref 39.0–52.0)
Hemoglobin: 14 g/dL (ref 13.0–17.0)
Immature Granulocytes: 0 %
Lymphocytes Relative: 13 %
Lymphs Abs: 1 10*3/uL (ref 0.7–4.0)
MCH: 24.4 pg — ABNORMAL LOW (ref 26.0–34.0)
MCHC: 30.2 g/dL (ref 30.0–36.0)
MCV: 81 fL (ref 80.0–100.0)
Monocytes Absolute: 0.8 10*3/uL (ref 0.1–1.0)
Monocytes Relative: 10 %
Neutro Abs: 5.8 10*3/uL (ref 1.7–7.7)
Neutrophils Relative %: 76 %
Platelets: 239 10*3/uL (ref 150–400)
RBC: 5.73 MIL/uL (ref 4.22–5.81)
RDW: 14.6 % (ref 11.5–15.5)
WBC: 7.6 10*3/uL (ref 4.0–10.5)
nRBC: 0 % (ref 0.0–0.2)

## 2021-01-13 MED ORDER — IOHEXOL 300 MG/ML  SOLN
75.0000 mL | Freq: Once | INTRAMUSCULAR | Status: AC | PRN
  Administered 2021-01-13: 75 mL via INTRAVENOUS

## 2021-01-13 MED ORDER — HYDROCODONE-ACETAMINOPHEN 5-325 MG PO TABS
1.0000 | ORAL_TABLET | Freq: Once | ORAL | Status: AC
  Administered 2021-01-13: 1 via ORAL
  Filled 2021-01-13: qty 1

## 2021-01-13 NOTE — ED Triage Notes (Signed)
Pt presents to the ED from home with complaints of right lower front tooth pain that started this am. Got home from work and used Chlorahexidine rinse that he had from previous tooth extraction and after use, lower chin and mouth swelling occurred.

## 2021-01-13 NOTE — ED Provider Notes (Signed)
Emergency Medicine Provider Triage Evaluation Note  [facility] David Gibson. , a 55 y.o. male  was evaluated in triage.  Pt complains of gingival swelling that occurred earlier this morning.  Patient said he had a tooth that was loose on the lower gumline.  He gargled with chlorhexidine antibiotic wash and was prescribed by his dentist and shortly after had significant swelling over the lower inner lip.  Patient is having trouble opening his mouth secondary to swelling.  No fever or chills.  Review of Systems  Positive:  Negative: See above.  Physical Exam  BP (!) 159/110 (BP Location: Right Arm)   Pulse 60   Temp 98.9 F (37.2 C) (Oral)   Resp 20   Ht 5\' 7"  (1.702 m)   Wt 92.1 kg   SpO2 100%   BMI 31.79 kg/m  Gen:   Awake, no distress   Resp:  Normal effort  MSK:   Moves extremities without difficulty  Other:  Talking in complete sentences.  Significant swelling over the inner lower lip.  Multiple dental caries.  Medical Decision Making  Medically screening exam initiated at 8:53 PM.  Appropriate orders placed.  . was informed that the remainder of the evaluation will be completed by another provider, this initial triage assessment does not replace that evaluation, and the importance of remaining in the ED until their evaluation is complete.     [employer], PA-C 01/13/21 2055    [place], 2056, MD 01/13/21 [phone]

## 2021-01-14 MED ORDER — KETOROLAC TROMETHAMINE 30 MG/ML IJ SOLN
30.0000 mg | Freq: Once | INTRAMUSCULAR | Status: AC
  Administered 2021-01-14: 30 mg via INTRAVENOUS
  Filled 2021-01-14: qty 1

## 2021-01-14 MED ORDER — KETOROLAC TROMETHAMINE 60 MG/2ML IM SOLN: 30.0000 mg | Freq: Once | INTRAMUSCULAR | Status: DC

## 2021-01-14 MED ORDER — PENICILLIN V POTASSIUM 250 MG PO TABS
500.0000 mg | ORAL_TABLET | Freq: Once | ORAL | Status: AC
  Administered 2021-01-14: 500 mg via ORAL
  Filled 2021-01-14: qty 2

## 2021-01-14 MED ORDER — NAPROXEN 500 MG PO TABS: 500.0000 mg | ORAL_TABLET | Freq: Two times a day (BID) | ORAL | 0 refills | Status: AC

## 2021-01-14 MED ORDER — PENICILLIN V POTASSIUM 500 MG PO TABS: 500.0000 mg | ORAL_TABLET | Freq: Four times a day (QID) | ORAL | 0 refills | Status: AC

## 2021-01-14 NOTE — ED Notes (Signed)
Discharge instructions including prescription and follow up care discussed with pt. Pt verbalized understanding with no questions at this time. Pt to go home with family at bedside 

## 2021-01-14 NOTE — ED Provider Notes (Signed)
[facility] EMERGENCY DEPARTMENT Provider Note   CSN: [id number] Arrival date & time: 01/13/21  1850     History Chief Complaint  Patient presents with   Dental Problem    David Cummins Cleopher Chasyn Cinque. is a 55 y.o. male.  HPI     This a 55 year old male who presents with dental pain.  Patient reports that he noted increasing dental pain and swelling over the last 24 to 48 hours in the right front incisor.  He has noted that has been slightly loose.  He had a tooth extracted on the left approximately 1 month ago and has been using Peridex rinse.  However, he had progressive swelling.  He rates his pain at 10 out of 10.  He denies any fevers or trouble swallowing.  History reviewed. No pertinent past medical history.  Patient Active Problem List   Diagnosis Date Noted   Multiple closed fractures of ribs of right side 07/06/2019   Pneumothorax, traumatic 07/06/2019    History reviewed. No pertinent surgical history.     Family History  Problem Relation Age of Onset   Diabetes Mother     Social History   Tobacco Use   Smoking status: Every Day    Types: Cigars   Smokeless tobacco: Never  Substance Use Topics   Alcohol use: Yes    Alcohol/week: 6.0 standard drinks    Types: 6 Cans of beer per week   Drug use: Yes    Types: Marijuana    Home Medications Prior to Admission medications   Medication Sig Start Date End Date Taking? Authorizing Provider  naproxen (NAPROSYN) 500 MG tablet Take 1 tablet (500 mg total) by mouth 2 (two) times daily. 01/14/21  Yes Hasini Peachey, Mayer Masker, MD  penicillin v potassium (VEETID) 500 MG tablet Take 1 tablet (500 mg total) by mouth 4 (four) times daily. 01/14/21  Yes Garrette Caine, Mayer Masker, MD  lidocaine (LIDODERM) 5 % Place 1 patch onto the skin every 12 (twelve) hours. Remove & Discard patch within 12 hours or as directed by MD 07/06/19   Myra Rude, MD  oxyCODONE-acetaminophen (PERCOCET) 5-325 MG tablet Take 1-2  tablets by mouth every 4 (four) hours as needed for severe pain. 07/20/19   Myra Rude, MD    Allergies    Patient has no known allergies.  Review of Systems   Review of Systems  Constitutional:  Negative for fever.  HENT:  Positive for dental problem. Negative for trouble swallowing.   Respiratory:  Negative for shortness of breath.   Cardiovascular:  Negative for chest pain.  All other systems reviewed and are negative.  Physical Exam Updated Vital Signs BP (!) 145/95   Pulse 72   Temp 98.9 F (37.2 C) (Oral)   Resp 17   Ht 1.702 m (5\' 7" )   Wt 92.1 kg   SpO2 98%   BMI 31.79 kg/m   Physical Exam Vitals and nursing note reviewed.  Constitutional:      Appearance: He is well-developed. He is not ill-appearing.  HENT:     Head: Normocephalic and atraumatic.     Nose: Nose normal.     Mouth/Throat:     Mouth: Mucous membranes are moist.     Dentition: Dental tenderness, gingival swelling and dental caries present.      Comments: Multiple dental caries, no fullness noted under the tongue Eyes:     Pupils: Pupils are equal, round, and reactive to light.  Cardiovascular:  Rate and Rhythm: Normal rate and regular rhythm.  Pulmonary:     Effort: Pulmonary effort is normal. No respiratory distress.  Abdominal:     Palpations: Abdomen is soft.  Musculoskeletal:     Cervical back: Neck supple.     Right lower leg: No edema.     Left lower leg: No edema.  Lymphadenopathy:     Cervical: No cervical adenopathy.  Skin:    General: Skin is warm and dry.  Neurological:     Mental Status: He is alert and oriented to person, place, and time.  Psychiatric:        Mood and Affect: Mood normal.    ED Results / Procedures / Treatments   Labs (all labs ordered are listed, but only abnormal results are displayed) Labs Reviewed  CBC WITH DIFFERENTIAL/PLATELET - Abnormal; Notable for the following components:      Result Value   MCH 24.4 (*)    All other components  within normal limits  BASIC METABOLIC PANEL - Abnormal; Notable for the following components:   Glucose, Bld 116 (*)    All other components within normal limits    EKG None  Radiology CT Soft Tissue Neck W Contrast  Result Date: 01/14/2021 CLINICAL DATA:  Dental abscess, right lower front to an jaw pain started this morning EXAM: CT NECK WITH CONTRAST TECHNIQUE: Multidetector CT imaging of the neck was performed using the standard protocol following the bolus administration of intravenous contrast. CONTRAST:  39mL OMNIPAQUE IOHEXOL 300 MG/ML  SOLN COMPARISON:  None. FINDINGS: Possible gas and fluid collection along the posterior aspect of central mandibular teeth (series 7, image 63), measuring up to 1.2 cm. Multiple periapical lucency, most prominently about the bilateral first mandibular molars, right second mandibular molar, and right central and lateral mandibular incisors. Additional periapical lucency about the right maxillary molars. The left maxillary molars are absent. Pharynx and larynx: Normal. No mass or swelling. Salivary glands: No inflammation, mass, or stone. Thyroid: Normal. Lymph nodes: Mildly prominent right level 1 B lymph nodes, which are otherwise unremarkable. Vascular: Negative. Limited intracranial: Negative. Visualized orbits: Negative. Mastoids and visualized paranasal sinuses: Mucosal thickening in the right maxillary sinus. The mastoid air cells are underpneumatized but otherwise negative. Skeleton: Degenerative changes in the cervical spine. Maxillary mandibular findings as described above. Upper chest: Negative. IMPRESSION: 1. Dental caries of the right central and lateral mandibular incisors, with a possible small gas and fluid collection along the posterior aspect of the central mandible, concerning for odontogenic abscess, although this may simply represent air underneath the tongue. Correlate with physical exam. 2. Multiple additional dental caries of the bilateral  mandibular molars and right maxillary molars. 3. Mildly prominent right level 1B lymph nodes, likely reactive. Electronically Signed   By: Wiliam Ke M.D.   On: 01/14/2021 00:27    Procedures Procedures   Medications Ordered in ED Medications  penicillin v potassium (VEETID) tablet 500 mg (has no administration in time range)  ketorolac (TORADOL) 30 MG/ML injection 30 mg (has no administration in time range)  HYDROcodone-acetaminophen (NORCO/VICODIN) 5-325 MG per tablet 1 tablet (1 tablet Oral Given 01/13/21 2057)  iohexol (OMNIPAQUE) 300 MG/ML solution 75 mL (75 mLs Intravenous Contrast Given 01/13/21 2252)    ED Course  I have reviewed the triage vital signs and the nursing notes.  Pertinent labs & imaging results that were available during my care of the patient were reviewed by me and considered in my medical decision making (see chart  for details).    MDM Rules/Calculators/A&P                           Patient presents with dental pain and swelling.  He is overall nontoxic and vital signs are reassuring.  He has likely an underlying infection.  He was seen and evaluated by [facility] provider.  Labs obtained which are reassuring.  CT scan showed a small amount of gas noted in the area of the right lower incisor.  Clinical exam is not suggestive of a drainable abscess.  Suspect however, indolent and underlying infection.  No signs or symptoms of Ludwig angina.  Will start on antibiotics.  Naproxen twice daily.  Recommend oral surgery follow-up for dental extraction.  After history, exam, and medical workup I feel the patient has been appropriately medically screened and is safe for discharge home. Pertinent diagnoses were discussed with the patient. Patient was given return precautions.  Final Clinical Impression(s) / ED Diagnoses Final diagnoses:  Dental infection    Rx / DC Orders ED Discharge Orders          Ordered    penicillin v potassium (VEETID) 500 MG tablet  4 times  daily        01/14/21 0121    naproxen (NAPROSYN) 500 MG tablet  2 times daily        01/14/21 0121             Valeri Sula, Mayer Masker, MD 01/14/21 [phone]

## 2021-01-14 NOTE — Discharge Instructions (Signed)
You were seen today for dental pain and swelling.  This likely represents an underlying infection.  There is no significant abscess noted on her CT scan although these can develop.  Start your antibiotics.  Follow-up with oral surgery.

## 2021-01-22 ENCOUNTER — Other Ambulatory Visit: Payer: Self-pay

## 2021-01-22 ENCOUNTER — Ambulatory Visit (HOSPITAL_COMMUNITY)
Admission: RE | Admit: 2021-01-22 | Discharge: 2021-01-22 | Disposition: A | Discharge status: Home / Self Care | Payer: 59 | Source: Ambulatory Visit | Attending: Internal Medicine | Admitting: Internal Medicine

## 2021-01-22 DIAGNOSIS — D8685 Sarcoid myocarditis: Secondary | ICD-10-CM | POA: Diagnosis present

## 2021-01-22 LAB — CBC WITH DIFFERENTIAL/PLATELET
Abs Immature Granulocytes: 0.45 10*3/uL — ABNORMAL HIGH (ref 0.00–0.07)
Basophils Absolute: 0 10*3/uL (ref 0.0–0.1)
Basophils Relative: 0 %
Eosinophils Absolute: 0 10*3/uL (ref 0.0–0.5)
Eosinophils Relative: 0 %
HCT: 41.2 % (ref 39.0–52.0)
Hemoglobin: 12.9 g/dL — ABNORMAL LOW (ref 13.0–17.0)
Immature Granulocytes: 5 %
Lymphocytes Relative: 21 %
Lymphs Abs: 1.9 10*3/uL (ref 0.7–4.0)
MCH: 30.3 pg (ref 26.0–34.0)
MCHC: 31.3 g/dL (ref 30.0–36.0)
MCV: 96.7 fL (ref 80.0–100.0)
Monocytes Absolute: 1 10*3/uL (ref 0.1–1.0)
Monocytes Relative: 12 %
Neutro Abs: 5.5 10*3/uL (ref 1.7–7.7)
Neutrophils Relative %: 62 %
Platelets: 123 10*3/uL — ABNORMAL LOW (ref 150–400)
RBC: 4.26 MIL/uL (ref 4.22–5.81)
RDW: 18.6 % — ABNORMAL HIGH (ref 11.5–15.5)
WBC: 8.9 10*3/uL (ref 4.0–10.5)
nRBC: 0 % (ref 0.0–0.2)

## 2021-01-22 LAB — COMPREHENSIVE METABOLIC PANEL
ALT: 26 U/L (ref 0–44)
AST: 19 U/L (ref 15–41)
Albumin: 3.3 g/dL — ABNORMAL LOW (ref 3.5–5.0)
Alkaline Phosphatase: 31 U/L — ABNORMAL LOW (ref 38–126)
Anion gap: 7 (ref 5–15)
BUN: 27 mg/dL — ABNORMAL HIGH (ref 6–20)
CO2: 27 mmol/L (ref 22–32)
Calcium: 8.9 mg/dL (ref 8.9–10.3)
Chloride: 106 mmol/L (ref 98–111)
Creatinine, Ser: 1.73 mg/dL — ABNORMAL HIGH (ref 0.61–1.24)
GFR, Estimated: 47 mL/min — ABNORMAL LOW (ref >60)
Glucose, Bld: 86 mg/dL (ref 70–99)
Potassium: 4.3 mmol/L (ref 3.5–5.1)
Sodium: 140 mmol/L (ref 135–145)
Total Bilirubin: 0.2 mg/dL — ABNORMAL LOW (ref 0.3–1.2)
Total Protein: 5.9 g/dL — ABNORMAL LOW (ref 6.5–8.1)

## 2021-01-23 LAB — CUP PACEART REMOTE DEVICE CHECK
Date Time Interrogation Session: 20221222080134
Implantable Lead Implant Date: 20220926
Implantable Lead Location: 753860
Implantable Lead Model: 436910
Implantable Lead Serial Number: 81470915
Implantable Pulse Generator Implant Date: 20220926
Pulse Gen Model: 429525
Pulse Gen Serial Number: 84847461

## 2021-01-30 ENCOUNTER — Encounter: Payer: Self-pay | Admitting: Cardiology

## 2021-01-30 ENCOUNTER — Other Ambulatory Visit: Payer: Self-pay

## 2021-01-30 ENCOUNTER — Ambulatory Visit (INDEPENDENT_AMBULATORY_CARE_PROVIDER_SITE_OTHER): Payer: 59 | Admitting: Cardiology

## 2021-01-30 VITALS — BP 110/78 | HR 97 | Ht 70.0 in | Wt 249.0 lb

## 2021-01-30 DIAGNOSIS — I5022 Chronic systolic (congestive) heart failure: Secondary | ICD-10-CM

## 2021-01-30 DIAGNOSIS — I4729 Other ventricular tachycardia: Secondary | ICD-10-CM | POA: Diagnosis not present

## 2021-01-30 DIAGNOSIS — Z95810 Presence of automatic (implantable) cardiac defibrillator: Secondary | ICD-10-CM

## 2021-01-30 DIAGNOSIS — D8685 Sarcoid myocarditis: Secondary | ICD-10-CM | POA: Diagnosis not present

## 2021-01-30 NOTE — Progress Notes (Signed)
Electrophysiology Office Follow up Visit Note:    Date:  01/30/2021   ID:  Nathan [name], DOB 08/31/1965, MRN [id number]  PCP:  Dois Davenport, MD  [facility] Cardiologist:  Chilton Si, MD  [facility] Electrophysiologist:  None    Interval History:    Nathan [name] is a 55 y.o. male who presents for a follow up visit.  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  He has done well since implant.  No syncope or presyncope.  He is walking regularly up to 1 mile.  He says that with climbing a couple flights of stairs he will get winded but otherwise his exercise capacity is doing okay.  He says the weight gain is the most bothersome thing while being on the steroid.  He is having trouble getting into a rheumatologist office.       Past Medical History:  Diagnosis Date   Allergies    09/16/2020   BPH (benign prostatic hyperplasia)    ED (erectile dysfunction)    GERD (gastroesophageal reflux disease)    Peripheral focal chorioretinal inflammation of both eyes    Retinal vasculitis of both eyes     Past Surgical History:  Procedure Laterality Date   BRONCHIAL NEEDLE ASPIRATION BIOPSY  10/18/2020   Procedure: BRONCHIAL NEEDLE ASPIRATION BIOPSIES;  Surgeon: Josephine Igo, DO;  Location: [facility] ENDOSCOPY;  Service: Pulmonary;;   BRONCHIAL WASHINGS  10/18/2020   Procedure: BRONCHIAL WASHINGS;  Surgeon: Josephine Igo, DO;  Location: [facility] ENDOSCOPY;  Service: Pulmonary;;   ICD IMPLANT N/A 10/28/2020   Procedure: ICD IMPLANT;  Surgeon: Lanier Prude, MD;  Location: [facility] INVASIVE CV LAB;  Service: Cardiovascular;  Laterality: N/A;   RIGHT/LEFT HEART CATH AND CORONARY ANGIOGRAPHY N/A 10/16/2020   Procedure: RIGHT/LEFT HEART CATH AND CORONARY ANGIOGRAPHY;  Surgeon: Marykay Lex, MD;  Location: [facility] INVASIVE CV LAB;  Service: Cardiovascular;  Laterality: N/A;   VIDEO BRONCHOSCOPY WITH ENDOBRONCHIAL ULTRASOUND Bilateral 10/18/2020    Procedure: VIDEO BRONCHOSCOPY WITH ENDOBRONCHIAL ULTRASOUND;  Surgeon: Josephine Igo, DO;  Location: [facility] ENDOSCOPY;  Service: Pulmonary;  Laterality: Bilateral;    Current Medications: No outpatient medications have been marked as taking for the 01/30/21 encounter (Office Visit) with Lanier Prude, MD.     Allergies:   Patient has no known allergies.   Social History   Socioeconomic History   Marital status: Married    Spouse name: Not on file   Number of children: Not on file   Years of education: Not on file   Highest education level: Not on file  Occupational History   Not on file  Tobacco Use   Smoking status: Former    Packs/day: 0.50    Types: Cigarettes    Quit date: 02/02/2010    Years since quitting: 11.0   Smokeless tobacco: Never  Substance and Sexual Activity   Alcohol use: Yes   Drug use: Not Currently   Sexual activity: Not on file  Other Topics Concern   Not on file  Social History Narrative   Not on file   Social Determinants of Health   Financial Resource Strain: Low Risk    Difficulty of Paying Living Expenses: Not very hard  Food Insecurity: No Food Insecurity   Worried About Running Out of Food in the Last Year: Never true   Ran Out of Food in the Last Year: Never true  Transportation Needs: No Transportation Needs  Lack of Transportation (Medical): No   Lack of Transportation (Non-Medical): No  Physical Activity: Not on file  Stress: Not on file  Social Connections: Not on file     Family History: The patient's family history includes Cancer (age of onset: 9) in his father; Diabetes in his father.  ROS:   Please see the history of present illness.    All other systems reviewed and are negative.  EKGs/Labs/Other Studies Reviewed:    The following studies were reviewed today:  January 30, 2021 in clinic device interrogation personally reviewed Lead parameters are stable 12% PVC burden Changed RV outputs today. 2 NSVT  episodes  EKG:  The ekg ordered today demonstrates sinus rhythm with very frequent PVCs  Recent Labs: 10/25/2020: B Natriuretic Peptide 344.2 11/04/2020: Magnesium 2.3 01/22/2021: ALT 26; BUN 27; Creatinine, Ser 1.73; Hemoglobin 12.9; Platelets 123; Potassium 4.3; Sodium 140  Recent Lipid Panel No results found for: CHOL, TRIG, HDL, CHOLHDL, VLDL, LDLCALC, LDLDIRECT  Physical Exam:    VS:  Ht 5\' 10"  (1.778 m)    BMI 34.67 kg/m     Wt Readings from Last 3 Encounters:  01/08/21 241 lb 9.6 oz (109.6 kg)  12/16/20 224 lb (101.6 kg)  11/25/20 220 lb (99.8 kg)     GEN:  Well nourished, well developed in no acute distress.  Obese HEENT: Normal NECK: No JVD; No carotid bruits LYMPHATICS: No lymphadenopathy CARDIAC: Irregular rhythm, no murmurs, rubs, gallops RESPIRATORY:  Clear to auscultation without rales, wheezing or rhonchi  ABDOMEN: Soft, non-tender, non-distended MUSCULOSKELETAL:  No edema; No deformity  SKIN: Warm and dry NEUROLOGIC:  Alert and oriented x 3 PSYCHIATRIC:  Normal affect        ASSESSMENT:    1. Chronic systolic congestive heart failure (HCC)   2. Cardiac sarcoidosis   3. Ventricular tachycardia, non-sustained   4. Implantable cardioverter-defibrillator (ICD) in situ    PLAN:    In order of problems listed above:  #Chronic systolic heart failure NYHA class II-III symptoms today.  Warm and dry on exam.  Secondary to nonischemic cardiomyopathy/cardiac sarcoidosis. Continue Farxiga, spironolactone  Follows with heart failure clinic  #Cardiac sarcoidosis On methotrexate and prednisone  #ICD in situ Device functioning appropriately 2 NSVT episodes.  No therapies delivered.  #Ventricular tachycardia, nonsustained Continue amiodarone 200 mg by mouth daily.  Has recent blood work.  I will plan to see him back in 6 months or sooner as needed.    Medication Adjustments/Labs and Tests Ordered: Current medicines are reviewed at length with the  patient today.  Concerns regarding medicines are outlined above.  No orders of the defined types were placed in this encounter.  No orders of the defined types were placed in this encounter.    Signed, Lars Mage, MD, [facility], [facility] 01/30/2021 1:58 PM    Electrophysiology  [facility]

## 2021-01-30 NOTE — Patient Instructions (Addendum)
Medication Instructions:  Your physician recommends that you continue on your current medications as directed. Please refer to the Current Medication list given to you today. *If you need a refill on your cardiac medications before your next appointment, please call your pharmacy*  Lab Work: None ordered. If you have labs (blood work) drawn today and your tests are completely normal, you will receive your results only by: MyChart Message (if you have MyChart) OR A paper copy in the mail If you have any lab test that is abnormal or we need to change your treatment, we will call you to review the results.  Testing/Procedures: None ordered.  Follow-Up: At [facility], you and your health needs are our priority.  As part of our continuing mission to provide you with exceptional heart care, we have created designated Provider Care Teams.  These Care Teams include your primary Cardiologist (physician) and Advanced Practice Providers (APPs -  Physician Assistants and Nurse Practitioners) who all work together to provide you with the care you need, when you need it.  Your next appointment:   Your physician wants you to follow-up in: 6 months with one of the following Advanced Practice Providers on your designated Care Team:   Francis Dowse, [place] Casimiro Needle "Mardelle Matte" Frank, [place] You will receive a reminder letter in the mail two months in advance. If you don't receive a letter, please call our office to schedule the follow-up appointment.  Remote monitoring is used to monitor your ICD from home. This monitoring reduces the number of office visits required to check your device to one time per year. It allows [place] to keep an eye on the functioning of your device to ensure it is working properly. You are scheduled for a device check from home on 04/29/2021. You may send your transmission at any time that day. If you have a wireless device, the transmission will be sent automatically. After your physician  reviews your transmission, you will receive a postcard with your next transmission date.

## 2021-02-07 ENCOUNTER — Ambulatory Visit (HOSPITAL_BASED_OUTPATIENT_CLINIC_OR_DEPARTMENT_OTHER)
Admission: RE | Admit: 2021-02-07 | Discharge: 2021-02-07 | Disposition: A | Discharge status: Home / Self Care | Payer: 59 | Source: Ambulatory Visit | Attending: Internal Medicine | Admitting: Internal Medicine

## 2021-02-07 ENCOUNTER — Ambulatory Visit (HOSPITAL_COMMUNITY)
Admission: RE | Admit: 2021-02-07 | Discharge: 2021-02-07 | Disposition: A | Discharge status: Home / Self Care | Payer: 59 | Source: Ambulatory Visit | Attending: Internal Medicine | Admitting: Internal Medicine

## 2021-02-07 ENCOUNTER — Other Ambulatory Visit (HOSPITAL_COMMUNITY): Payer: Self-pay

## 2021-02-07 ENCOUNTER — Other Ambulatory Visit: Payer: Self-pay

## 2021-02-07 ENCOUNTER — Encounter (HOSPITAL_COMMUNITY): Payer: Self-pay | Admitting: Internal Medicine

## 2021-02-07 VITALS — BP 106/66 | HR 87 | Wt 245.0 lb

## 2021-02-07 DIAGNOSIS — R590 Localized enlarged lymph nodes: Secondary | ICD-10-CM | POA: Insufficient documentation

## 2021-02-07 DIAGNOSIS — Z7984 Long term (current) use of oral hypoglycemic drugs: Secondary | ICD-10-CM | POA: Insufficient documentation

## 2021-02-07 DIAGNOSIS — H30033 Focal chorioretinal inflammation, peripheral, bilateral: Secondary | ICD-10-CM | POA: Insufficient documentation

## 2021-02-07 DIAGNOSIS — D8689 Sarcoidosis of other sites: Secondary | ICD-10-CM | POA: Diagnosis not present

## 2021-02-07 DIAGNOSIS — I428 Other cardiomyopathies: Secondary | ICD-10-CM | POA: Insufficient documentation

## 2021-02-07 DIAGNOSIS — I493 Ventricular premature depolarization: Secondary | ICD-10-CM

## 2021-02-07 DIAGNOSIS — I371 Nonrheumatic pulmonary valve insufficiency: Secondary | ICD-10-CM | POA: Insufficient documentation

## 2021-02-07 DIAGNOSIS — I472 Ventricular tachycardia, unspecified: Secondary | ICD-10-CM | POA: Diagnosis not present

## 2021-02-07 DIAGNOSIS — I340 Nonrheumatic mitral (valve) insufficiency: Secondary | ICD-10-CM | POA: Diagnosis not present

## 2021-02-07 DIAGNOSIS — I5022 Chronic systolic (congestive) heart failure: Secondary | ICD-10-CM | POA: Diagnosis present

## 2021-02-07 DIAGNOSIS — I4729 Other ventricular tachycardia: Secondary | ICD-10-CM | POA: Diagnosis not present

## 2021-02-07 DIAGNOSIS — Z7952 Long term (current) use of systemic steroids: Secondary | ICD-10-CM | POA: Insufficient documentation

## 2021-02-07 DIAGNOSIS — Z95810 Presence of automatic (implantable) cardiac defibrillator: Secondary | ICD-10-CM | POA: Diagnosis not present

## 2021-02-07 DIAGNOSIS — Z79899 Other long term (current) drug therapy: Secondary | ICD-10-CM | POA: Diagnosis not present

## 2021-02-07 DIAGNOSIS — D8685 Sarcoid myocarditis: Secondary | ICD-10-CM

## 2021-02-07 DIAGNOSIS — I272 Pulmonary hypertension, unspecified: Secondary | ICD-10-CM | POA: Insufficient documentation

## 2021-02-07 DIAGNOSIS — N1831 Chronic kidney disease, stage 3a: Secondary | ICD-10-CM | POA: Diagnosis not present

## 2021-02-07 LAB — COMPREHENSIVE METABOLIC PANEL
ALT: 20 U/L (ref 0–44)
AST: 19 U/L (ref 15–41)
Albumin: 3.5 g/dL (ref 3.5–5.0)
Alkaline Phosphatase: 35 U/L — ABNORMAL LOW (ref 38–126)
Anion gap: 6 (ref 5–15)
BUN: 24 mg/dL — ABNORMAL HIGH (ref 6–20)
CO2: 25 mmol/L (ref 22–32)
Calcium: 9 mg/dL (ref 8.9–10.3)
Chloride: 106 mmol/L (ref 98–111)
Creatinine, Ser: 1.7 mg/dL — ABNORMAL HIGH (ref 0.61–1.24)
GFR, Estimated: 48 mL/min — ABNORMAL LOW (ref >60)
Glucose, Bld: 95 mg/dL (ref 70–99)
Potassium: 4.2 mmol/L (ref 3.5–5.1)
Sodium: 137 mmol/L (ref 135–145)
Total Bilirubin: 0.6 mg/dL (ref 0.3–1.2)
Total Protein: 6.3 g/dL — ABNORMAL LOW (ref 6.5–8.1)

## 2021-02-07 LAB — ECHOCARDIOGRAM COMPLETE
AR max vel: 1.83 cm2
AV Peak grad: 6.6 mmHg
Ao pk vel: 1.28 m/s
Area-P 1/2: 5.97 cm2
Calc EF: 33.9 %
MV M vel: 4.28 m/s
MV Peak grad: 73.4 mmHg
S' Lateral: 5.7 cm
Single Plane A2C EF: 34.6 %
Single Plane A4C EF: 33.4 %

## 2021-02-07 LAB — CBC WITH DIFFERENTIAL/PLATELET
Abs Immature Granulocytes: 0.14 10*3/uL — ABNORMAL HIGH (ref 0.00–0.07)
Basophils Absolute: 0 10*3/uL (ref 0.0–0.1)
Basophils Relative: 0 %
Eosinophils Absolute: 0.1 10*3/uL (ref 0.0–0.5)
Eosinophils Relative: 1 %
HCT: 44 % (ref 39.0–52.0)
Hemoglobin: 14 g/dL (ref 13.0–17.0)
Immature Granulocytes: 3 %
Lymphocytes Relative: 20 %
Lymphs Abs: 1.1 10*3/uL (ref 0.7–4.0)
MCH: 29.7 pg (ref 26.0–34.0)
MCHC: 31.8 g/dL (ref 30.0–36.0)
MCV: 93.2 fL (ref 80.0–100.0)
Monocytes Absolute: 0.6 10*3/uL (ref 0.1–1.0)
Monocytes Relative: 10 %
Neutro Abs: 3.7 10*3/uL (ref 1.7–7.7)
Neutrophils Relative %: 66 %
Platelets: 131 10*3/uL — ABNORMAL LOW (ref 150–400)
RBC: 4.72 MIL/uL (ref 4.22–5.81)
RDW: 16.5 % — ABNORMAL HIGH (ref 11.5–15.5)
WBC: 5.6 10*3/uL (ref 4.0–10.5)
nRBC: 0 % (ref 0.0–0.2)

## 2021-02-07 LAB — BRAIN NATRIURETIC PEPTIDE: B Natriuretic Peptide: 94 pg/mL (ref 0.0–100.0)

## 2021-02-07 MED ORDER — PREDNISONE 10 MG PO TABS
ORAL_TABLET | ORAL | 0 refills | Status: DC
  Filled 2021-02-07: qty 30, 38d supply, fill #0
  Filled 2021-02-07: qty 30, 30d supply, fill #0

## 2021-02-07 MED ORDER — METHOTREXATE 2.5 MG PO TABS
ORAL_TABLET | ORAL | 0 refills | Status: DC
  Filled 2021-02-07: qty 30, 30d supply, fill #0

## 2021-02-07 NOTE — Progress Notes (Signed)
Advanced Heart Failure Clinic Note   PCP: Hayden Rasmussen, MD PCP-Cardiologist: Skeet Latch, MD  HF Cardiologist: Dr. Haroldine Laws  HPI: Mr Algis Downs is a 56 y.o. male with history of chronic systolic heart failure, mitral regurgitation, VT s/p ICD, cardiac sarcoidosis and peripheral focal chorioretinal inflammation of both eyes diagnosed in 2020 (followed at [facility] ophthalmology).     Admitted 10/12/20 with increased shortness of breath. CT chest with bulky mediastinal lymphadenopathy. Pulmonary consulted, underwent bronchoscopy + biopsy. Path showed no malignancy. Echo showed EF 25 % with reduced RV. Underwent R/LHC with normal cors and stable hemodynamics. cMRI that showed EF 18% with LGE pattern consistent with cardiac sarcoidosis. GMDT limited by hypotension. He was on 1/2 tab Bidil + [place]. Placed on prednisone, metrotrexate, Bactrim for PJP. Had NSVT and high PVC burden and started on mexilitene 200 mg bid. Discharged with Life Vest due to frequent PVCs.   Re-admitted 10/25/20 after LifeVest alarmed several times. Had frequent runs of NSVT and > 25% PVC burden. He was placed on IV amiodarone with some suppression of PVCs. Mexitil was discontinued and IV amiodarone started, transitioned eventually  to PO. EP consulted and d/t extensive transmural LGE and severely reduced LVEF, ICD placed for primary prevention. Had hypotension with amiodarone gtt and was started on midodrine. GDMT limited by hypotension. Discharge weight 211-215 lbs.  Saw Dr. Quentin Ore last week. Devices parameters ok. VT quiescent. Continued amio 200 daily.   Today he returns for HF follow up with his wife.Not overly active. Can walk 1 mile at at time without problem. No SOB, orthopnea or PND. No ICD shocks.  Taking prednisone 15 mg daily (ran out Wednesday) and MTX 12.5 every Wednesday. Taking midodrine 2.5 bid. Ran out earlier this week. SBP ~ 110-115 at home. No readings < 100.    Echo today 02/07/21 EF 30-35% RV  normal. Personally reviewed    Cardiac Studies: - Echo (9/22): EF 25%, reduced RV.  - cMRI (9/22): EF 18% with LGE pattern consistent with cardiac sarcoidosis.  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 PAP 46/19 mm with a mean of 33 mmHg.   PCWP 27 mmHg and LVEDP 30 mmHg.  In the setting of systolic pressures 99991111 mmHg Cardiac Output (Fick) 6.75-3.06.-Index SVR 10.96, PVR 0.9 (Wood units)  Past Medical History:  Diagnosis Date   Allergies    09/16/2020   BPH (benign prostatic hyperplasia)    ED (erectile dysfunction)    GERD (gastroesophageal reflux disease)    Peripheral focal chorioretinal inflammation of both eyes    Retinal vasculitis of both eyes    Current Outpatient Medications  Medication Sig Dispense Refill   amiodarone (PACERONE) 200 MG tablet Take 1 tablet (200 mg total) by mouth daily. 90 tablet 3   Blood Pressure Monitoring (OMRON 3 SERIES BP MONITOR) DEVI Use as directed 1 each 0   Calcium Carbonate-Vitamin D3 (CALCIUM 600+D) 600-400 MG-UNIT TABS Take 1 tablet by mouth 2 (two) times daily. 180 tablet 3   cetirizine (ZYRTEC) 10 MG tablet Take 10 mg by mouth daily.     dapagliflozin propanediol (FARXIGA) 10 MG TABS tablet Take 1 tablet (10 mg total) by mouth daily. 90 tablet 3   digoxin (LANOXIN) 0.125 MG tablet Take 0.5 tablets (0.0625 mg total) by mouth daily. 45 tablet 3   feeding supplement (ENSURE ENLIVE / ENSURE PLUS) LIQD Take 237 mLs by mouth 2 (two) times daily between meals.  123XX123 mL 12   folic acid (FOLVITE) 1 MG tablet Take 1 tablet (1 mg total) by mouth daily. 30 tablet 11   methotrexate (RHEUMATREX) 2.5 MG tablet Take 5 tablets (12.5 mg total) by mouth every Wednesday. Caution:Chemotherapy. Protect from light. 30 tablet 3   midodrine (PROAMATINE) 5 MG tablet Take 0.5 tablets (2.5 mg total) by mouth 3 (three) times daily with meals. 270 tablet 1   Multiple Vitamins-Minerals  (CERTAVITE/ANTIOXIDANTS) TABS Take 1 tablet by mouth daily. 30 tablet 0   omeprazole (PRILOSEC) 40 MG capsule Take 1 capsule (40 mg total) by mouth every morning. 90 capsule 3   predniSONE (DELTASONE) 10 MG tablet Take 20 mg (2 tabs) daily for 2 weeks, then decrease to 15 mg (1.5 tablets) daily 180 tablet 1   spironolactone (ALDACTONE) 25 MG tablet Take 1/2 tablet (12.5 mg total) by mouth daily. 45 tablet 3   sulfamethoxazole-trimethoprim (BACTRIM DS) 800-160 MG tablet Take 1 tablet by mouth every Monday, Wednesday, and Friday. 15 tablet 5   tamsulosin (FLOMAX) 0.4 MG CAPS capsule Take 2 capsules (0.8 mg total) by mouth daily. 60 capsule 0   No current facility-administered medications for this encounter.   No Known Allergies  Social History   Socioeconomic History   Marital status: Married    Spouse name: Not on file   Number of children: Not on file   Years of education: Not on file   Highest education level: Not on file  Occupational History   Not on file  Tobacco Use   Smoking status: Former    Packs/day: 0.50    Types: Cigarettes    Quit date: 02/02/2010    Years since quitting: 11.0   Smokeless tobacco: Never  Substance and Sexual Activity   Alcohol use: Yes   Drug use: Not Currently   Sexual activity: Not on file  Other Topics Concern   Not on file  Social History Narrative   Not on file   Social Determinants of Health   Financial Resource Strain: Low Risk    Difficulty of Paying Living Expenses: Not very hard  Food Insecurity: No Food Insecurity   Worried About Running Out of Food in the Last Year: Never true   Ran Out of Food in the Last Year: Never true  Transportation Needs: No Transportation Needs   Lack of Transportation (Medical): No   Lack of Transportation (Non-Medical): No  Physical Activity: Not on file  Stress: Not on file  Social Connections: Not on file  Intimate Partner Violence: Not on file   Family History  Problem Relation Age of Onset    Cancer Father 90   Diabetes Father    BP 106/66    Pulse 87    Wt 111.1 kg (245 lb)    SpO2 99%    BMI 35.15 kg/m   Wt Readings from Last 3 Encounters:  02/07/21 111.1 kg (245 lb)  01/30/21 112.9 kg (249 lb)  01/08/21 109.6 kg (241 lb 9.6 oz)   PHYSICAL EXAM: General:  Well appearing. No resp difficulty HEENT: normal Neck: supple. no JVD. Carotids 2+ bilat; no bruits. No lymphadenopathy or thryomegaly appreciated. Cor: PMI nondisplaced. Regular rate & rhythm. No rubs, gallops or murmurs. Lungs: clear Abdomen: soft, nontender, nondistended. No hepatosplenomegaly. No bruits or masses. Good bowel sounds. Extremities: no cyanosis, clubbing, rash, edema Neuro: alert & orientedx3, cranial nerves grossly intact. moves all 4 extremities w/o difficulty. Affect pleasant   ECG: SR 88 with 1 PVCs (personally reviewed).  ASSESSMENT & PLAN:  1. Cardiac Sarcoid - cMRI 10/17/20 c/w cardiac sarcoid. - Decrease prednisone to 10 x 2 weeks then 5mg . At next visit will stop - Increase MTX to 15mg  q Wednesday x 2 weeks then increase to 17.5mg  qwed. At next visit got to 20mg  - Continue folic acid, Vit D + calcium and PPI. Can stop PJP prophylaxis - S/p ICD placement 09/26. - Will get PET scan to make sure sarcoid suppressed - CMET, CBC today w/ diff today per protocol  2. NSVT/ Frequent PVCs  - Previously on mexilitene 200 mg bid. Now on amio 200 daily - S/p Biotronik ICD placement 10/28/20.  - SR on ECG today with PVCs. - Saw Dr. Quentin Ore last week. Continue amiodarone 200 mg daily.    3. 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 - Echo today 02/07/21 EF 30-35% RV normal. Personally reviewed - NYHA II. Does not appear volume overloaded. He is not on daily loop diuretic. - GDMT titration limited today by BP. - Taking midodrine 2.5 bid but ran out Wednesday. BP ok.  Can stop.  - Continue Farxiga 10 mg daily. - Continue sprionolactone 12.5 mg daily. - Continue digoxin 0.125 mg daily.  - If BP stable off midodrine. Can add beta blocker + ARN/ARNi as able. - Labs today  4. CKD Stage IIIa - SCr baseline 1.4-1.6 - Labs today.    5. Mediastinal Lymphadenopathy - 10/17/20 bronchoscopy + biopsy.  - Path showed no malignancy or evidence of sarcoid.   Glori Bickers, MD 02/07/21

## 2021-02-07 NOTE — Patient Instructions (Signed)
Medication Changes:  Decrease Prednisone to 10mg  (1 Tab) Daily for 2 weeks, hen decrease to 5mg  (1/2 Tab) daily starting on 02/26/2021  Increase Methotrexate to 15mg  ( 6 Tab) Weekly for 2 weeks, then increase to 17.5mg  ( 7 Tab) weekly starting on 02/26/2021  Lab Work:  Labs done today, your results will be available in MyChart, we will contact you for abnormal readings.   Testing/Procedures:  PET Scan at [facility] once approved by insurance, we will call to schedule once approved.  Referrals:  You have been referred to Dr. 02/28/2021 (Reumatology)  You have been referred to Dr. (Dermatology)    Special Instructions // Education:  noe  Follow-Up in: 2 months  At the Advanced [facility] Failure Clinic, you and your health needs are our priority. We have a designated team specialized in the treatment of Heart Failure. This Care Team includes your primary Heart Failure Specialized Cardiologist (physician), Advanced Practice Providers (APPs- Physician Assistants and Nurse Practitioners), and Pharmacist who all work together to provide you with the care you need, when you need it.   You may see any of the following providers on your designated Care Team at your next follow up:  Dr 02/28/2021 Dr Dierdre Forth, NP Emily Filbert, Arvilla Meres [facility] [place], [place] [facility], PharmD   Please be sure to bring in all your medications bottles to every appointment.   Need to Contact [place]:  If you have any questions or concerns before your next appointment please send [place] a message through [place] or call our office at [phone].    TO LEAVE A MESSAGE FOR THE NURSE SELECT OPTION 2, PLEASE LEAVE A MESSAGE INCLUDING: YOUR NAME DATE OF BIRTH CALL BACK NUMBER REASON FOR CALL**this is important as we prioritize the call backs  YOU WILL RECEIVE A CALL BACK THE SAME DAY AS LONG AS YOU CALL BEFORE 4:00 PM

## 2021-02-07 NOTE — Progress Notes (Signed)
Referral faxed to Dr. Melissa Noon office.

## 2021-02-25 ENCOUNTER — Emergency Department (HOSPITAL_COMMUNITY)
Admission: EM | Admit: 2021-02-25 | Discharge: 2021-02-25 | Disposition: A | Discharge status: Home / Self Care | Payer: 59 | Attending: Emergency Medicine | Admitting: Emergency Medicine

## 2021-02-25 DIAGNOSIS — K0889 Other specified disorders of teeth and supporting structures: Secondary | ICD-10-CM | POA: Diagnosis present

## 2021-02-25 DIAGNOSIS — K029 Dental caries, unspecified: Secondary | ICD-10-CM | POA: Insufficient documentation

## 2021-02-25 MED ORDER — CLINDAMYCIN HCL 150 MG PO CAPS
300.0000 mg | ORAL_CAPSULE | Freq: Once | ORAL | Status: AC
  Administered 2021-02-25: 20:00:00 300 mg via ORAL
  Filled 2021-02-25: qty 2

## 2021-02-25 MED ORDER — CLINDAMYCIN HCL 300 MG PO CAPS: 300.0000 mg | ORAL_CAPSULE | Freq: Three times a day (TID) | ORAL | 0 refills | Status: AC

## 2021-02-25 NOTE — ED Provider Notes (Signed)
[facility] EMERGENCY DEPARTMENT Provider Note   CSN: [id number] Arrival date & time: 02/25/21  1242     History  Chief Complaint  Patient presents with   Dental Pain    David Bells Breylin Dom. is a 56 y.o. male.  The history is provided by the patient.  Dental Pain Location:  Upper Quality:  Aching Severity:  Mild Onset quality:  Gradual Timing:  Constant Progression:  Unchanged Chronicity:  New Context: dental caries and filling fell out   Relieved by:  Nothing Worsened by:  Nothing Associated symptoms: gum swelling   Associated symptoms: no congestion, no difficulty swallowing, no drooling, no facial pain, no facial swelling, no fever, no headaches, no neck pain, no neck swelling, no oral bleeding, no oral lesions and no trismus       Home Medications Prior to Admission medications   Medication Sig Start Date End Date Taking? Authorizing Provider  clindamycin (CLEOCIN) 300 MG capsule Take 1 capsule (300 mg total) by mouth 3 (three) times daily for 10 days. 02/25/21 03/07/21 Yes Jerri Hargadon, DO  lidocaine (LIDODERM) 5 % Place 1 patch onto the skin every 12 (twelve) hours. Remove & Discard patch within 12 hours or as directed by MD 07/06/19   Myra Rude, MD  naproxen (NAPROSYN) 500 MG tablet Take 1 tablet (500 mg total) by mouth 2 (two) times daily. 01/14/21   Horton, Mayer Masker, MD  oxyCODONE-acetaminophen (PERCOCET) 5-325 MG tablet Take 1-2 tablets by mouth every 4 (four) hours as needed for severe pain. 07/20/19   Myra Rude, MD  penicillin v potassium (VEETID) 500 MG tablet Take 1 tablet (500 mg total) by mouth 4 (four) times daily. 01/14/21   Horton, Mayer Masker, MD      Allergies    Patient has no known allergies.    Review of Systems   Review of Systems  Constitutional:  Negative for fever.  HENT:  Negative for congestion, drooling, facial swelling and mouth sores.   Musculoskeletal:  Negative for neck pain.  Neurological:   Negative for headaches.   Physical Exam Updated Vital Signs BP (!) 150/106 (BP Location: Right Arm)    Pulse 89    Temp 98.8 F (37.1 C) (Oral)    Resp 14    SpO2 96%  Physical Exam HENT:     Head: Normocephalic and atraumatic.     Comments: Dental caries throughout, no obvious oral abscess, no trismus, no drooling, no facial swelling    Nose: Nose normal. No congestion.     Mouth/Throat:     Mouth: Mucous membranes are moist.     Pharynx: No oropharyngeal exudate or posterior oropharyngeal erythema.  Eyes:     Extraocular Movements: Extraocular movements intact.     Pupils: Pupils are equal, round, and reactive to light.  Neurological:     Mental Status: He is alert.    ED Results / Procedures / Treatments   Labs (all labs ordered are listed, but only abnormal results are displayed) Labs Reviewed - No data to display  EKG None  Radiology No results found.  Procedures Procedures    Medications Ordered in ED Medications  clindamycin (CLEOCIN) capsule 300 mg (has no administration in time range)    ED Course/ Medical Decision Making/ A&P                           Medical Decision Making Risk Prescription drug management.  [employer]. is here with dental pain.  Unremarkable vitals.  Overall very well-appearing.  No major risk factors.  Has dental caries throughout on exam.  Pain mostly to the upper teeth.  No obvious abscess or facial swelling.  No trismus no drooling.  Overall differential diagnosis is cavity versus dental caries.  No concern for deep space infectious process.  Patient given a dose of clindamycin.  Will refer to dentistry.  Will prescribe antibiotics for outpatient.  This chart was dictated using voice recognition software.  Despite best efforts to proofread,  errors can occur which can change the documentation meaning.         Final Clinical Impression(s) / ED Diagnoses Final diagnoses:  Dental caries    Rx / DC  Orders ED Discharge Orders          Ordered    clindamycin (CLEOCIN) 300 MG capsule  3 times daily        02/25/21 1952              David Norfolk, DO 02/25/21 1955

## 2021-02-25 NOTE — ED Triage Notes (Signed)
Pt. Stated, I have some bad teeth on the bottom and an abscess on upper rt.

## 2021-03-07 ENCOUNTER — Telehealth (HOSPITAL_COMMUNITY): Payer: Self-pay | Admitting: *Deleted

## 2021-03-07 NOTE — Telephone Encounter (Signed)
PET scan PA submitted on [email or web address]

## 2021-03-10 NOTE — Progress Notes (Signed)
Advanced Heart Failure Clinic Note   PCP: Hayden Rasmussen, MD PCP-Cardiologist: Skeet Latch, MD  HF Cardiologist: Dr. Haroldine Laws  HPI: [name] is a 56 y.o. male with history of chronic systolic heart failure, mitral regurgitation, VT s/p ICD, cardiac sarcoidosis and peripheral focal chorioretinal inflammation of both eyes diagnosed in 2020 (followed at [facility] ophthalmology).     Admitted 10/12/20 with increased shortness of breath. CT chest with bulky mediastinal lymphadenopathy. Pulmonary consulted, underwent bronchoscopy + biopsy. Path showed no malignancy. Echo showed EF 25 % with reduced RV. Underwent R/LHC with normal cors and stable hemodynamics. cMRI that showed EF 18% with LGE pattern consistent with cardiac sarcoidosis. GMDT limited by hypotension. He was on 1/2 tab Bidil + [place]. Placed on prednisone, metrotrexate, Bactrim for PJP. Had NSVT and high PVC burden and started on mexilitene 200 mg bid. Discharged with Life Vest due to frequent PVCs.   Re-admitted 10/25/20 after LifeVest alarmed several times. Had frequent runs of NSVT and > 25% PVC burden. He was placed on IV amiodarone with some suppression of PVCs. Mexitil was discontinued and IV amiodarone started, transitioned eventually  to PO. EP consulted and d/t extensive transmural LGE and severely reduced LVEF, ICD placed for primary prevention. Had hypotension with amiodarone gtt and was started on midodrine. GDMT limited by hypotension. Discharge weight 211-215 lbs.  Saw Dr. Quentin Ore 12/22. Devices parameters ok. VT quiescent. Continued amio 200 daily.   Follow up 1/23, doing well, NYHA II symptoms and volume stable. Midodrine & PJP prophylaxis stopped and PET scan arranged.   Echo 02/07/21 EF 30-35% RV normal.   Today he returns for HF follow up with his wife. Feeling ok, gets SOB walking uphill. Has some positional dizziness. Overall feeling fine. Denies palpitations, abnormal bleeding,CP, dizziness, edema, or  PND/Orthopnea. Appetite ok. No fever or chills. Weight at home 246 pounds. Taking all medications. Has not heard back from Rheumatology or Derm. Still has rash on his back.   Cardiac Studies: - Echo (1/23) EF 30-35%, RV normal.  - Echo (9/22): EF 25%, reduced RV.  - cMRI (9/22): EF 18% with LGE pattern consistent with cardiac sarcoidosis.  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 PAP 46/19 mm with a mean of 33 mmHg.   PCWP 27 mmHg and LVEDP 30 mmHg.  In the setting of systolic pressures 99991111 mmHg Cardiac Output (Fick) 6.75-3.06.-Index SVR 10.96, PVR 0.9 (Wood units)  Past Medical History:  Diagnosis Date   Allergies    09/16/2020   BPH (benign prostatic hyperplasia)    ED (erectile dysfunction)    GERD (gastroesophageal reflux disease)    Peripheral focal chorioretinal inflammation of both eyes    Retinal vasculitis of both eyes    Current Outpatient Medications  Medication Sig Dispense Refill   amiodarone (PACERONE) 200 MG tablet Take 1 tablet (200 mg total) by mouth daily. 90 tablet 3   Blood Pressure Monitoring (OMRON 3 SERIES BP MONITOR) DEVI Use as directed 1 each 0   Calcium Carbonate-Vitamin D3 (CALCIUM 600+D) 600-400 MG-UNIT TABS Take 1 tablet by mouth 2 (two) times daily. 180 tablet 3   cetirizine (ZYRTEC) 10 MG tablet Take 10 mg by mouth daily.     dapagliflozin propanediol (FARXIGA) 10 MG TABS tablet Take 1 tablet (10 mg total) by mouth daily. 90 tablet 3   digoxin (LANOXIN) 0.125 MG tablet Take 0.5 tablets (0.0625 mg total) by mouth daily. 45 tablet  3   feeding supplement (ENSURE ENLIVE / ENSURE PLUS) LIQD Take 237 mLs by mouth 2 (two) times daily between meals. 123XX123 mL 12   folic acid (FOLVITE) 1 MG tablet Take 1 tablet (1 mg total) by mouth daily. 30 tablet 11   methotrexate (RHEUMATREX) 2.5 MG tablet Take 6 tablets (15 mg total) by mouth every Wednesday for 19 days, THEN 7 tablets  (17.5 mg total) every Wednesday for 19 days. Caution:Chemotherapy. Protect from light.. 30 tablet 0   Multiple Vitamins-Minerals (CERTAVITE/ANTIOXIDANTS) TABS Take 1 tablet by mouth daily. 30 tablet 0   omeprazole (PRILOSEC) 40 MG capsule Take 1 capsule (40 mg total) by mouth every morning. 90 capsule 3   predniSONE (DELTASONE) 10 MG tablet Take 1 tablet (10 mg total) by mouth daily with breakfast for 14 days THEN 0.5 tablets (5 mg total) daily with breakfast and stay on this dose. 30 tablet 0   spironolactone (ALDACTONE) 25 MG tablet Take 1/2 tablet (12.5 mg total) by mouth daily. 45 tablet 3   sulfamethoxazole-trimethoprim (BACTRIM DS) 800-160 MG tablet Take 1 tablet by mouth every Monday, Wednesday, and Friday. 15 tablet 5   tamsulosin (FLOMAX) 0.4 MG CAPS capsule Take 2 capsules (0.8 mg total) by mouth daily. 60 capsule 0   No current facility-administered medications for this encounter.   No Known Allergies  Social History   Socioeconomic History   Marital status: Married    Spouse name: Not on file   Number of children: Not on file   Years of education: Not on file   Highest education level: Not on file  Occupational History   Not on file  Tobacco Use   Smoking status: Former    Packs/day: 0.50    Types: Cigarettes    Quit date: 02/02/2010    Years since quitting: 11.1   Smokeless tobacco: Never  Substance and Sexual Activity   Alcohol use: Yes   Drug use: Not Currently   Sexual activity: Not on file  Other Topics Concern   Not on file  Social History Narrative   Not on file   Social Determinants of Health   Financial Resource Strain: Low Risk    Difficulty of Paying Living Expenses: Not very hard  Food Insecurity: No Food Insecurity   Worried About Running Out of Food in the Last Year: Never true   Ran Out of Food in the Last Year: Never true  Transportation Needs: No Transportation Needs   Lack of Transportation (Medical): No   Lack of Transportation  (Non-Medical): No  Physical Activity: Not on file  Stress: Not on file  Social Connections: Not on file  Intimate Partner Violence: Not on file   Family History  Problem Relation Age of Onset   Cancer Father 40   Diabetes Father    BP 108/78    Pulse 87    Wt 112.9 kg (248 lb 12.8 oz)    SpO2 100%    BMI 35.70 kg/m   Wt Readings from Last 3 Encounters:  03/12/21 112.9 kg (248 lb 12.8 oz)  02/07/21 111.1 kg (245 lb)  01/30/21 112.9 kg (249 lb)   PHYSICAL EXAM: General:  NAD. No resp difficulty HEENT: Normal Neck: Supple. No JVD. Carotids 2+ bilat; no bruits. No lymphadenopathy or thryomegaly appreciated. Cor: PMI nondisplaced. Regular rate & rhythm. No rubs, gallops or murmurs. Lungs: Clear Abdomen: Soft, nontender, nondistended. No hepatosplenomegaly. No bruits or masses. Good bowel sounds. Extremities: No cyanosis, clubbing, rash, edema Neuro: Alert &  oriented x 3, cranial nerves grossly intact. Moves all 4 extremities w/o difficulty. Affect pleasant.  ECG: SR w/ PVCs 82 bpm (personally reviewed).  ASSESSMENT & PLAN:  1. Cardiac Sarcoid - cMRI 10/17/20 c/w cardiac sarcoid. - Can stop prednisone - Increase MTX to 20 mg q Wednesdays - Continue folic acid, Vit D + calcium and PPI. Can stop PJP prophylaxis. - S/p ICD placement 09/26. - Will get PET scan to make sure sarcoid suppressed. Awaiting PA. - Recent CMET, CBC today w/ diff 2 weeks ago ok.  2. NSVT/ Frequent PVCs  - Previously on mexilitene 200 mg bid. Now on amio 200 daily - S/p Biotronik ICD placement 10/28/20.  - SR on ECG today with PVCs. - Continue amiodarone 200 mg daily per EP.    3. 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 - Echo 02/07/21 EF 30-35% RV normal. - NYHA II. Does not appear volume overloaded. He is not on daily loop diuretic. - GDMT titration limited previously  by BP. Now off midodrine. - Start carvedilol 3.125 mg bid. - Continue Farxiga 10 mg daily. - Continue sprionolactone 12.5 mg daily. - Continue digoxin 0.125 mg daily.  - If BP stable, can add + ARN/ARNi next. - BMET and dig level today.  4. CKD Stage IIIa - SCr baseline 1.4-1.6 - Labs today.    5. Mediastinal Lymphadenopathy - 10/17/20 bronchoscopy + biopsy.  - Path showed no malignancy or evidence of sarcoid.  Follow up in 4-6 weeks with PharmD (add ARB/ARNi is BP allows, will need CMET and CBC w. diff) and in 12 weeks with Dr. Haroldine Laws.   [place], FNP 03/12/21

## 2021-03-12 ENCOUNTER — Ambulatory Visit (HOSPITAL_COMMUNITY)
Admission: RE | Admit: 2021-03-12 | Discharge: 2021-03-12 | Disposition: A | Discharge status: Home / Self Care | Payer: 59 | Source: Ambulatory Visit | Attending: Family Medicine | Admitting: Family Medicine

## 2021-03-12 ENCOUNTER — Encounter (HOSPITAL_COMMUNITY): Payer: Self-pay

## 2021-03-12 ENCOUNTER — Other Ambulatory Visit: Payer: Self-pay

## 2021-03-12 ENCOUNTER — Other Ambulatory Visit (HOSPITAL_COMMUNITY): Payer: Self-pay

## 2021-03-12 VITALS — BP 108/78 | HR 87 | Wt 248.8 lb

## 2021-03-12 DIAGNOSIS — D8685 Sarcoid myocarditis: Secondary | ICD-10-CM | POA: Insufficient documentation

## 2021-03-12 DIAGNOSIS — N1831 Chronic kidney disease, stage 3a: Secondary | ICD-10-CM | POA: Diagnosis not present

## 2021-03-12 DIAGNOSIS — I5022 Chronic systolic (congestive) heart failure: Secondary | ICD-10-CM | POA: Diagnosis present

## 2021-03-12 DIAGNOSIS — I493 Ventricular premature depolarization: Secondary | ICD-10-CM | POA: Diagnosis not present

## 2021-03-12 DIAGNOSIS — I428 Other cardiomyopathies: Secondary | ICD-10-CM | POA: Insufficient documentation

## 2021-03-12 DIAGNOSIS — I472 Ventricular tachycardia, unspecified: Secondary | ICD-10-CM | POA: Diagnosis not present

## 2021-03-12 DIAGNOSIS — R590 Localized enlarged lymph nodes: Secondary | ICD-10-CM | POA: Diagnosis not present

## 2021-03-12 DIAGNOSIS — Z79899 Other long term (current) drug therapy: Secondary | ICD-10-CM | POA: Insufficient documentation

## 2021-03-12 DIAGNOSIS — Z87891 Personal history of nicotine dependence: Secondary | ICD-10-CM | POA: Diagnosis not present

## 2021-03-12 DIAGNOSIS — Z95810 Presence of automatic (implantable) cardiac defibrillator: Secondary | ICD-10-CM | POA: Diagnosis not present

## 2021-03-12 LAB — DIGOXIN LEVEL: Digoxin Level: 0.6 ng/mL — ABNORMAL LOW (ref 0.8–2.0)

## 2021-03-12 LAB — BASIC METABOLIC PANEL
Anion gap: 6 (ref 5–15)
BUN: 18 mg/dL (ref 6–20)
CO2: 27 mmol/L (ref 22–32)
Calcium: 9.4 mg/dL (ref 8.9–10.3)
Chloride: 108 mmol/L (ref 98–111)
Creatinine, Ser: 1.63 mg/dL — ABNORMAL HIGH (ref 0.61–1.24)
GFR, Estimated: 50 mL/min — ABNORMAL LOW (ref >60)
Glucose, Bld: 90 mg/dL (ref 70–99)
Potassium: 4.4 mmol/L (ref 3.5–5.1)
Sodium: 141 mmol/L (ref 135–145)

## 2021-03-12 MED ORDER — METHOTREXATE 2.5 MG PO TABS
20.0000 mg | ORAL_TABLET | ORAL | 3 refills | Status: DC
  Filled 2021-03-12 – 2021-04-06 (×3): qty 32, 28d supply, fill #0
  Filled 2021-05-05: qty 32, 28d supply, fill #1

## 2021-03-12 MED ORDER — CARVEDILOL 3.125 MG PO TABS
3.1250 mg | ORAL_TABLET | Freq: Two times a day (BID) | ORAL | 11 refills | Status: DC
  Filled 2021-03-12: qty 60, 30d supply, fill #0
  Filled 2021-05-05: qty 60, 30d supply, fill #1
  Filled 2021-07-01: qty 60, 30d supply, fill #2
  Filled 2021-07-29: qty 60, 30d supply, fill #3
  Filled 2021-08-28: qty 60, 30d supply, fill #4
  Filled 2021-10-29: qty 60, 30d supply, fill #5
  Filled 2021-11-26: qty 60, 30d supply, fill #6
  Filled 2021-12-30: qty 60, 30d supply, fill #7

## 2021-03-12 MED ORDER — METHOTREXATE SODIUM 10 MG PO TABS
20.0000 mg | ORAL_TABLET | ORAL | 1 refills | Status: DC
  Filled 2021-03-12: qty 10, 35d supply, fill #0

## 2021-03-12 NOTE — Patient Instructions (Addendum)
STOP Prednisone STOP Bactrim INCREASE Methotrexate to 20 mg once weekly on Wednesdays START Carvedilol 3.125 mg, one tab twice a day  Labs today We will only contact you if something comes back abnormal or we need to make some changes. Otherwise no news is good news!  You have been referred to [facility] Rheumatology with Dr Dierdre Forth -they will be in touch with an appointment  You have been referred to Dermatology Specialist with Dr Emily Filbert -they will be in touch with an appointment  Your physician recommends that you schedule a follow-up appointment in: 4-6 weeks with the CHF Pharmacy Team and in 12 weeks with Dr Gala Romney  Do the following things EVERYDAY: Weigh yourself in the morning before breakfast. Write it down and keep it in a log. Take your medicines as prescribed Eat low salt foods--Limit salt (sodium) to 2000 mg per day.  Stay as active as you can everyday Limit all fluids for the day to less than 2 liters  At the Advanced Heart Failure Clinic, you and your health needs are our priority. As part of our continuing mission to provide you with exceptional heart care, we have created designated Provider Care Teams. These Care Teams include your primary Cardiologist (physician) and Advanced Practice Providers (APPs- Physician Assistants and Nurse Practitioners) who all work together to provide you with the care you need, when you need it.   You may see any of the following providers on your designated Care Team at your next follow up: Dr Arvilla Meres Dr Carron Curie, NP Robbie Lis, [place] [facility] [place], [place] Karle Plumber, PharmD   Please be sure to bring in all your medications bottles to every appointment.

## 2021-03-13 ENCOUNTER — Telehealth (HOSPITAL_COMMUNITY): Payer: Self-pay | Admitting: Cardiology

## 2021-03-13 NOTE — Telephone Encounter (Signed)
Outgoing referral to rheumatology (Dr Amil Amen)  [phone] Faxed. Fax included OV demographocs, and insurance card.  Outgoing referral  to dermatology (Dr Delman Cheadle) f# [phone] faxed. Fax included OV demographics and insurance card

## 2021-03-17 ENCOUNTER — Telehealth (HOSPITAL_COMMUNITY): Payer: Self-pay | Admitting: *Deleted

## 2021-03-17 NOTE — Telephone Encounter (Signed)
Pet scan auth request submitted to [facility]

## 2021-04-07 ENCOUNTER — Other Ambulatory Visit (HOSPITAL_COMMUNITY): Payer: Self-pay

## 2021-04-08 NOTE — Progress Notes (Incomplete)
***In Progress***    Advanced Heart Failure Clinic Note   PCP: Hayden Rasmussen, MD PCP-Cardiologist: Skeet Latch, MD  HF Cardiologist: Dr. Haroldine Laws  HPI:  Mr. Canche is a 56 y.o. male with history of chronic systolic heart failure, mitral regurgitation, VT s/p ICD, cardiac sarcoidosis and peripheral focal chorioretinal inflammation of both eyes diagnosed in 2020 (followed at [facility] ophthalmology).    Admitted 10/12/20 with increased shortness of breath. CT chest with bulky mediastinal lymphadenopathy. Pulmonary consulted, underwent bronchoscopy + biopsy. Path showed no malignancy. Echo showed EF 25% with reduced RV. Underwent R/LHC with normal cors and stable hemodynamics. cMRI that showed EF 18% with LGE pattern consistent with cardiac sarcoidosis. GMDT limited by hypotension. He was on 1/2 tab Bidil + [place]. Placed on prednisone, methotrexate, Bactrim for PJP prophylaxis. Had NSVT and high PVC burden and started on mexilitene 200 mg BID. Discharged with LifeVest due to frequent PVCs.   Re-admitted 10/25/20 after LifeVest alarmed several times. Had frequent runs of NSVT and > 25% PVC burden. He was placed on IV amiodarone with some suppression of PVCs. Mexitil was discontinued and IV amiodarone started, transitioned eventually to PO. EP consulted and d/t extensive transmural LGE and severely reduced LVEF, ICD placed for primary prevention. Had hypotension with amiodarone gtt and was started on midodrine. GDMT limited by hypotension. Discharge weight 211-215 lbs.   Saw Dr. Quentin Ore 01/3021. Devices parameters ok. VT quiescent. Continued amio 200 mg daily.    Follow up 02/2021, doing well, NYHA II symptoms and volume stable. Midodrine & PJP prophylaxis stopped and PET scan arranged.    Echo 02/07/21 EF 30-35% RV normal.    He last returned for HF follow up with his wife on 03/12/21. Reported feeling ok and getting SOB walking uphill. Had some positional dizziness. Overall was feeling  fine. Denied palpitations, abnormal bleeding,CP, dizziness, edema, or PND/Orthopnea. Appetite was ok. No fever or chills. Weight at home was 246 pounds. Reported taking all medications. Had not heard back from Rheumatology or Derm. Still had rash on his back.  Today he returns to HF clinic for pharmacist medication titration. At last visit with NP, prednisone was stopped, and methotrexate increased to 20 mg daily, and carvedilol was started. ***  Overall feeling ***. Dizziness, lightheadedness, fatigue:  Chest pain or palpitations:  How is your breathing?: *** SOB: Able to complete all ADLs. Activity level ***  Weight at home pounds. Takes furosemide/torsemide/bumex *** mg *** daily. No loop  LEE PND/Orthopnea  Appetite *** Low-salt diet:   Physical Exam Cost/affordability of meds   HF Medications: Carvedilol 3.125 mg BID Spironolactone 12.5 mg daily Farxiga 10 mg daily Digoxin 0.0625 mg daily  Has the patient been experiencing any side effects to the medications prescribed?  {YES NO:22349}  Does the patient have any problems obtaining medications due to transportation or finances?   {YES NO:22349} [employer] (*** using copay cards?)  Understanding of regimen: {excellent/good/fair/poor:19665} Understanding of indications: {excellent/good/fair/poor:19665} Potential of compliance: {excellent/good/fair/poor:19665} Patient understands to avoid NSAIDs. Patient understands to avoid decongestants.    Pertinent Lab Values: 03/12/21: Serum creatinine 1.63, BUN 18, Potassium 4.4, Sodium 141, Digoxin 0.6   Vital Signs: Weight: *** (last clinic weight: 248.8 lb) Blood pressure: ***  Heart rate: ***   Assessment/Plan: 1. Cardiac Sarcoid - cMRI 10/17/20 c/w cardiac sarcoid. - *** Can stop prednisone - Continue methotrexate 20 mg every Wednesday - Continue folic acid, Vit D + calcium and PPI. ***Can stop PJP prophylaxis. - S/p ICD  placement 10/28/20. - Will get PET scan to  make sure sarcoid suppressed. Awaiting PA. - *** Recent CMET, CBC today w/ diff 2 weeks ago ok.   2. NSVT/ Frequent PVCs  - Previously on mexilitene 200 mg BID. Now on amiodarone 200 mg daily.  - S/p Biotronik ICD placement 10/28/20.  - *** SR on ECG today with PVCs. - Continue amiodarone 200 mg daily per EP.    3. Chronic HFrEF--> NICM, cardiac sarcoid - Echo (10/2020): EF 30-35%, RV ok, severe MR - R/LHC (10/2020): normal cors, mild to moderate pulmonary hypertension 2/2 MR, preserved CO/CI. - Echo (02/07/2021): EF 30-35% RV normal. - NYHA II. Does not appear volume overloaded. He is not on daily loop diuretic. - GDMT titration limited previously by BP. Now off midodrine. - *** Start carvedilol 3.125 mg BID. - Continue sprionolactone 12.5 mg daily. - Continue Farxiga 10 mg daily. - Continue digoxin 0.125 mg daily.  - If BP stable, can add + ARN/ARNi next. - BMET and dig level today.   4. CKD Stage IIIa - SCr baseline 1.4-1.6 - Labs today.    5. Mediastinal Lymphadenopathy - 10/17/20 bronchoscopy + biopsy.  - Path showed no malignancy or evidence of sarcoid.  Follow up ***   Audry Riles, PharmD, BCPS, BCCP, CPP Heart Failure Clinic Pharmacist [phone]

## 2021-04-09 ENCOUNTER — Other Ambulatory Visit (HOSPITAL_COMMUNITY): Payer: Self-pay | Admitting: Pharmacist

## 2021-04-09 ENCOUNTER — Other Ambulatory Visit (HOSPITAL_COMMUNITY): Payer: Self-pay

## 2021-04-09 ENCOUNTER — Other Ambulatory Visit: Payer: Self-pay

## 2021-04-09 ENCOUNTER — Ambulatory Visit (HOSPITAL_COMMUNITY)
Admission: RE | Admit: 2021-04-09 | Discharge: 2021-04-09 | Disposition: A | Discharge status: Home / Self Care | Payer: 59 | Source: Ambulatory Visit | Attending: Internal Medicine | Admitting: Internal Medicine

## 2021-04-09 VITALS — BP 100/60 | HR 84 | Wt 253.4 lb

## 2021-04-09 DIAGNOSIS — D869 Sarcoidosis, unspecified: Secondary | ICD-10-CM | POA: Diagnosis present

## 2021-04-09 DIAGNOSIS — N1831 Chronic kidney disease, stage 3a: Secondary | ICD-10-CM | POA: Diagnosis not present

## 2021-04-09 DIAGNOSIS — I472 Ventricular tachycardia, unspecified: Secondary | ICD-10-CM | POA: Insufficient documentation

## 2021-04-09 DIAGNOSIS — I5022 Chronic systolic (congestive) heart failure: Secondary | ICD-10-CM | POA: Insufficient documentation

## 2021-04-09 DIAGNOSIS — I340 Nonrheumatic mitral (valve) insufficiency: Secondary | ICD-10-CM | POA: Diagnosis not present

## 2021-04-09 DIAGNOSIS — R591 Generalized enlarged lymph nodes: Secondary | ICD-10-CM | POA: Diagnosis not present

## 2021-04-09 DIAGNOSIS — I255 Ischemic cardiomyopathy: Secondary | ICD-10-CM | POA: Insufficient documentation

## 2021-04-09 LAB — CBC WITH DIFFERENTIAL/PLATELET
Abs Immature Granulocytes: 0.03 10*3/uL (ref 0.00–0.07)
Basophils Absolute: 0 10*3/uL (ref 0.0–0.1)
Basophils Relative: 0 %
Eosinophils Absolute: 0.1 10*3/uL (ref 0.0–0.5)
Eosinophils Relative: 3 %
HCT: 39.4 % (ref 39.0–52.0)
Hemoglobin: 12.3 g/dL — ABNORMAL LOW (ref 13.0–17.0)
Immature Granulocytes: 1 %
Lymphocytes Relative: 18 %
Lymphs Abs: 0.9 10*3/uL (ref 0.7–4.0)
MCH: 28.7 pg (ref 26.0–34.0)
MCHC: 31.2 g/dL (ref 30.0–36.0)
MCV: 91.8 fL (ref 80.0–100.0)
Monocytes Absolute: 1.1 10*3/uL — ABNORMAL HIGH (ref 0.1–1.0)
Monocytes Relative: 22 %
Neutro Abs: 2.7 10*3/uL (ref 1.7–7.7)
Neutrophils Relative %: 56 %
Platelets: 245 10*3/uL (ref 150–400)
RBC: 4.29 MIL/uL (ref 4.22–5.81)
RDW: 15.5 % (ref 11.5–15.5)
WBC: 4.8 10*3/uL (ref 4.0–10.5)
nRBC: 0 % (ref 0.0–0.2)

## 2021-04-09 LAB — COMPREHENSIVE METABOLIC PANEL
ALT: 17 U/L (ref 0–44)
AST: 19 U/L (ref 15–41)
Albumin: 3.5 g/dL (ref 3.5–5.0)
Alkaline Phosphatase: 50 U/L (ref 38–126)
Anion gap: 6 (ref 5–15)
BUN: 15 mg/dL (ref 6–20)
CO2: 25 mmol/L (ref 22–32)
Calcium: 8.9 mg/dL (ref 8.9–10.3)
Chloride: 110 mmol/L (ref 98–111)
Creatinine, Ser: 1.71 mg/dL — ABNORMAL HIGH (ref 0.61–1.24)
GFR, Estimated: 47 mL/min — ABNORMAL LOW (ref >60)
Glucose, Bld: 85 mg/dL (ref 70–99)
Potassium: 4 mmol/L (ref 3.5–5.1)
Sodium: 141 mmol/L (ref 135–145)
Total Bilirubin: 0.4 mg/dL (ref 0.3–1.2)
Total Protein: 6.4 g/dL — ABNORMAL LOW (ref 6.5–8.1)

## 2021-04-09 MED ORDER — SPIRONOLACTONE 25 MG PO TABS
25.0000 mg | ORAL_TABLET | Freq: Every day | ORAL | 3 refills | Status: DC
Start: 2021-04-09 — End: 2022-06-30
  Filled 2021-04-09 (×2): qty 90, 90d supply, fill #0
  Filled 2021-07-29: qty 90, 90d supply, fill #1
  Filled 2021-10-29: qty 90, 90d supply, fill #2
  Filled 2022-03-18: qty 90, 90d supply, fill #3

## 2021-04-09 NOTE — Patient Instructions (Addendum)
It was a pleasure seeing you today! ? ?MEDICATIONS: ?-We are changing your medications today ?-Increase spironolactone to 25 mg (1 tablet) daily ?-Call if you have questions about your medications. ? ?LABS: ?-We will call you if your labs need attention. ? ?NEXT APPOINTMENT: ?Labs on March 17th (next Friday) 9am ?Return to clinic in 2 months with Dr. Haroldine Laws. ? ?In general, to take care of your heart failure: ?-Limit your fluid intake to 2 Liters (half-gallon) per day.   ?-Limit your salt intake to ideally 2-3 grams (2000-3000 mg) per day. ?-Weigh yourself daily and record, and bring that "weight diary" to your next appointment.  (Weight gain of 2-3 pounds in 1 day typically means fluid weight.) ?-The medications for your heart are to help your heart and help you live longer.   ?-Please contact us before stopping any of your heart medications. ? ?Call the clinic at [phone] with questions or to reschedule future appointments.  ?

## 2021-04-09 NOTE — Progress Notes (Addendum)
? ?  ?Advanced Heart Failure Clinic Note  ? ?PCP: Hayden Rasmussen, MD ?PCP-Cardiologist: Skeet Latch, MD  ?HF Cardiologist: Dr. Haroldine Laws ? ?HPI:  ?Nathan [name] is a 56 y.o. male with history of chronic systolic heart failure, mitral regurgitation, VT s/p ICD, cardiac sarcoidosis and peripheral focal chorioretinal inflammation of both eyes diagnosed in 2020 (followed at [facility] ophthalmology).   ? ?Admitted 10/12/20 with increased shortness of breath. CT chest with bulky mediastinal lymphadenopathy. Pulmonary consulted, underwent bronchoscopy + biopsy. Path showed no malignancy. Echo showed EF 25% with reduced RV. Underwent R/LHC with normal cors and stable hemodynamics. cMRI that showed EF 18% with LGE pattern consistent with cardiac sarcoidosis. GDMT limited by hypotension. He was on 1/2 tab Bidil + [place]. Placed on prednisone, methotrexate, Bactrim for PJP prophylaxis. Had NSVT and high PVC burden and started on mexilitene 200 mg BID. Discharged with LifeVest due to frequent PVCs. ?  ?Re-admitted 10/25/20 after LifeVest alarmed several times. Had frequent runs of NSVT and > 25% PVC burden. He was placed on IV amiodarone with some suppression of PVCs. Mexitil was discontinued and IV amiodarone started, transitioned eventually to PO. EP consulted and d/t extensive transmural LGE and severely reduced LVEF, ICD placed for primary prevention. Had hypotension with amiodarone gtt and was started on midodrine. GDMT limited by hypotension. Discharge weight 211-215 lbs. ?  ?Saw Dr. Quentin Ore 01/3021. Devices parameters ok. VT quiescent. Continued amiodarone 200 mg daily.  ?  ?Follow up 02/2021, doing well, NYHA II symptoms and volume stable. Midodrine & PJP prophylaxis stopped and PET scan arranged.  ?  ?Echo 02/07/21 EF 30-35% RV normal.  ?  ?He last returned for HF follow up with his wife on 03/12/21. Reported feeling ok and getting SOB walking uphill. Had some positional dizziness. Overall was feeling fine. Denied  palpitations, abnormal bleeding, CP, dizziness, edema, or PND/Orthopnea. Appetite was ok. No fever or chills. Weight at home was 246 pounds. Reported taking all medications. Had not heard back from Rheumatology or Derm. Still had rash on his back. ? ?Today he returns to HF clinic for pharmacist medication titration. At last visit with NP, prednisone was stopped, and methotrexate increased to 20 mg weekly, and carvedilol 3.125 mg BID was started. He reports overall feeling well. Endorses feeling "swimmy-headed" and lightheaded two mornings per week. Some fatigue but this is stable. No CP or palpitations. Breathing has been good. SOB when walking longer distances. Activity level is normal. Reports home BP readings of [id number] with most systolic readings [id number]. These readings are sometimes before he takes his medications. He has taken his medications this morning. Weight at home 252 lbs. Does not use a loop diuretic. No LEE, PND, orthopnea. Appetite good, may be slightly less since stopping prednisone. Eats a low salt diet.  ? ?HF Medications: ?Carvedilol 3.125 mg BID ?Spironolactone 12.5 mg daily ?Farxiga 10 mg daily ?Digoxin 0.0625 mg daily ? ?Has the patient been experiencing any side effects to the medications prescribed?  No ? ?Does the patient have any problems obtaining medications due to transportation or finances?   No - Has [employer] employee insurance plan through his wife. Also reports that he was just approved for Medicaid. No issues affording copays.  ? ?Understanding of regimen: good ?Understanding of indications: good ?Potential of compliance: good ?Patient understands to avoid NSAIDs. ?Patient understands to avoid decongestants. ?  ?Pertinent Lab Values: ?04/09/21: Serum creatinine 1.71, BUN 15, Potassium 4.0, Sodium 141, Platelets 245 ? ?Vital Signs: ?Weight: 253.4 lbs (  last clinic weight: 248.8 lbs) ?Blood pressure: 100/60  ?Heart rate: 84  ? ?Assessment/Plan: ?1. Cardiac Sarcoid ?- cMRI  10/17/20 c/w cardiac sarcoid. ?- Continue methotrexate 20 mg every Wednesday ?- Prednisone taper is complete and PJP prophylaxis has been discontinued. Can also stop PPI, vitamin D, calcium now that he is off prednisone. Patient would like to stay on vitamin D and calcium for general health but will stop omeprazole today.  ?- S/p ICD placement 10/28/20. ?- Will get PET scan to make sure sarcoid suppressed. Awaiting PA.  ?- CMET and CBC with diff today are stable ? ?2. NSVT/ Frequent PVCs  ?- Previously on mexiletine 200 mg BID. Now on amiodarone 200 mg daily.  ?- S/p Biotronik ICD placement 10/28/20.  ?- Continue amiodarone 200 mg daily per EP.  ? ?3. 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 ?- Echo (02/07/2021): EF 30-35% RV normal. ?- NYHA II. Weight up 5 lbs. Trace edema on exam. He is not on a daily loop diuretic. ?- GDMT titration limited previously by BP. Now off midodrine.  ?- Continue carvedilol 3.125 mg BID. ?- Increase spironolactone to 25 mg daily. ?- Continue Farxiga 10 mg daily. ?- Continue digoxin 0.0625 mg daily.  ?- CMET today is stable. Repeat BMET in 10 days.  ?  ?4. CKD Stage IIIa ?- SCr baseline 1.4-1.6 ?- CMET today is stable.  ?  ?5. Mediastinal Lymphadenopathy ?- 10/17/20 bronchoscopy + biopsy.  ?- Path showed no malignancy or evidence of sarcoid. ? ?Follow up BMET in 10 days. Next visit in May with Dr. Haroldine Laws. ? ?Nathan [name], PharmD, BCPS, BCCP, CPP ?Heart Failure Clinic Pharmacist ?[phone] ?  ?

## 2021-04-14 ENCOUNTER — Other Ambulatory Visit (HOSPITAL_COMMUNITY): Payer: Self-pay

## 2021-04-18 ENCOUNTER — Other Ambulatory Visit: Payer: Self-pay

## 2021-04-18 ENCOUNTER — Ambulatory Visit (HOSPITAL_COMMUNITY)
Admission: RE | Admit: 2021-04-18 | Discharge: 2021-04-18 | Disposition: A | Discharge status: Home / Self Care | Payer: 59 | Source: Ambulatory Visit | Attending: Cardiology | Admitting: Cardiology

## 2021-04-18 DIAGNOSIS — I5022 Chronic systolic (congestive) heart failure: Secondary | ICD-10-CM | POA: Diagnosis present

## 2021-04-18 LAB — BASIC METABOLIC PANEL
Anion gap: 7 (ref 5–15)
BUN: 17 mg/dL (ref 6–20)
CO2: 24 mmol/L (ref 22–32)
Calcium: 8.8 mg/dL — ABNORMAL LOW (ref 8.9–10.3)
Chloride: 107 mmol/L (ref 98–111)
Creatinine, Ser: 1.83 mg/dL — ABNORMAL HIGH (ref 0.61–1.24)
GFR, Estimated: 44 mL/min — ABNORMAL LOW (ref >60)
Glucose, Bld: 104 mg/dL — ABNORMAL HIGH (ref 70–99)
Potassium: 4.2 mmol/L (ref 3.5–5.1)
Sodium: 138 mmol/L (ref 135–145)

## 2021-04-29 ENCOUNTER — Ambulatory Visit (INDEPENDENT_AMBULATORY_CARE_PROVIDER_SITE_OTHER): Payer: 59

## 2021-04-29 DIAGNOSIS — I4729 Other ventricular tachycardia: Secondary | ICD-10-CM

## 2021-04-29 LAB — CUP PACEART REMOTE DEVICE CHECK
Date Time Interrogation Session: 20230328090907
Implantable Lead Implant Date: 20220926
Implantable Lead Location: 753860
Implantable Lead Model: 436910
Implantable Lead Serial Number: 81470915
Implantable Pulse Generator Implant Date: 20220926
Pulse Gen Model: 429525
Pulse Gen Serial Number: 84847461

## 2021-05-05 ENCOUNTER — Other Ambulatory Visit (HOSPITAL_COMMUNITY): Payer: Self-pay

## 2021-05-05 MED ORDER — VITAMIN D (ERGOCALCIFEROL) 1.25 MG (50000 UNIT) PO CAPS
50000.0000 [IU] | ORAL_CAPSULE | ORAL | 0 refills | Status: DC
  Filled 2021-05-05: qty 12, 84d supply, fill #0

## 2021-05-12 NOTE — Progress Notes (Signed)
Remote ICD transmission.   

## 2021-05-28 ENCOUNTER — Other Ambulatory Visit (HOSPITAL_COMMUNITY): Payer: Self-pay

## 2021-05-28 MED ORDER — ERGOCALCIFEROL 1.25 MG (50000 UT) PO CAPS
1.0000 | ORAL_CAPSULE | ORAL | 0 refills | Status: DC
  Filled 2021-05-28: qty 12, 84d supply, fill #0

## 2021-05-28 MED ORDER — METHOTREXATE 2.5 MG PO TABS
12.5000 mg | ORAL_TABLET | ORAL | 2 refills | Status: DC
  Filled 2021-05-28: qty 30, 42d supply, fill #0

## 2021-05-28 MED ORDER — OMEPRAZOLE 40 MG PO CPDR
40.0000 mg | DELAYED_RELEASE_CAPSULE | Freq: Every morning | ORAL | 3 refills | Status: DC
  Filled 2021-05-28 – 2021-07-29 (×2): qty 90, 90d supply, fill #0
  Filled 2021-10-29: qty 90, 90d supply, fill #1

## 2021-05-28 MED ORDER — SILDENAFIL CITRATE 20 MG PO TABS
40.0000 mg | ORAL_TABLET | ORAL | 0 refills | Status: DC | PRN
  Filled 2021-05-28: qty 50, 16d supply, fill #0
  Filled 2021-09-24: qty 50, 17d supply, fill #0

## 2021-05-29 ENCOUNTER — Other Ambulatory Visit (HOSPITAL_COMMUNITY): Payer: Self-pay

## 2021-06-03 ENCOUNTER — Other Ambulatory Visit (HOSPITAL_COMMUNITY): Payer: Self-pay

## 2021-06-03 MED ORDER — AMIODARONE HCL 200 MG PO TABS
200.0000 mg | ORAL_TABLET | Freq: Every day | ORAL | 1 refills | Status: DC
  Filled 2021-06-03: qty 90, 90d supply, fill #0

## 2021-06-04 ENCOUNTER — Other Ambulatory Visit (HOSPITAL_COMMUNITY): Payer: Self-pay

## 2021-06-04 ENCOUNTER — Encounter (HOSPITAL_COMMUNITY): Payer: Self-pay | Admitting: Internal Medicine

## 2021-06-04 ENCOUNTER — Ambulatory Visit (HOSPITAL_COMMUNITY)
Admission: RE | Admit: 2021-06-04 | Discharge: 2021-06-04 | Disposition: A | Discharge status: Home / Self Care | Payer: 59 | Source: Ambulatory Visit | Attending: Internal Medicine | Admitting: Internal Medicine

## 2021-06-04 VITALS — BP 110/70 | HR 74 | Wt 251.6 lb

## 2021-06-04 DIAGNOSIS — D8689 Sarcoidosis of other sites: Secondary | ICD-10-CM | POA: Diagnosis not present

## 2021-06-04 DIAGNOSIS — D869 Sarcoidosis, unspecified: Secondary | ICD-10-CM

## 2021-06-04 DIAGNOSIS — R590 Localized enlarged lymph nodes: Secondary | ICD-10-CM | POA: Diagnosis not present

## 2021-06-04 DIAGNOSIS — I959 Hypotension, unspecified: Secondary | ICD-10-CM | POA: Diagnosis not present

## 2021-06-04 DIAGNOSIS — I493 Ventricular premature depolarization: Secondary | ICD-10-CM | POA: Diagnosis not present

## 2021-06-04 DIAGNOSIS — Z95810 Presence of automatic (implantable) cardiac defibrillator: Secondary | ICD-10-CM | POA: Insufficient documentation

## 2021-06-04 DIAGNOSIS — I340 Nonrheumatic mitral (valve) insufficiency: Secondary | ICD-10-CM | POA: Diagnosis not present

## 2021-06-04 DIAGNOSIS — R0602 Shortness of breath: Secondary | ICD-10-CM | POA: Insufficient documentation

## 2021-06-04 DIAGNOSIS — I428 Other cardiomyopathies: Secondary | ICD-10-CM | POA: Insufficient documentation

## 2021-06-04 DIAGNOSIS — Z79899 Other long term (current) drug therapy: Secondary | ICD-10-CM | POA: Insufficient documentation

## 2021-06-04 DIAGNOSIS — I472 Ventricular tachycardia, unspecified: Secondary | ICD-10-CM | POA: Diagnosis not present

## 2021-06-04 DIAGNOSIS — N1831 Chronic kidney disease, stage 3a: Secondary | ICD-10-CM | POA: Insufficient documentation

## 2021-06-04 DIAGNOSIS — I272 Pulmonary hypertension, unspecified: Secondary | ICD-10-CM | POA: Diagnosis not present

## 2021-06-04 DIAGNOSIS — I5022 Chronic systolic (congestive) heart failure: Secondary | ICD-10-CM | POA: Insufficient documentation

## 2021-06-04 LAB — COMPREHENSIVE METABOLIC PANEL
ALT: 16 U/L (ref 0–44)
AST: 15 U/L (ref 15–41)
Albumin: 3.6 g/dL (ref 3.5–5.0)
Alkaline Phosphatase: 58 U/L (ref 38–126)
Anion gap: 5 (ref 5–15)
BUN: 14 mg/dL (ref 6–20)
CO2: 24 mmol/L (ref 22–32)
Calcium: 9.1 mg/dL (ref 8.9–10.3)
Chloride: 108 mmol/L (ref 98–111)
Creatinine, Ser: 1.65 mg/dL — ABNORMAL HIGH (ref 0.61–1.24)
GFR, Estimated: 49 mL/min — ABNORMAL LOW (ref >60)
Glucose, Bld: 87 mg/dL (ref 70–99)
Potassium: 4.2 mmol/L (ref 3.5–5.1)
Sodium: 137 mmol/L (ref 135–145)
Total Bilirubin: 0.7 mg/dL (ref 0.3–1.2)
Total Protein: 6.3 g/dL — ABNORMAL LOW (ref 6.5–8.1)

## 2021-06-04 LAB — TSH: TSH: 0.615 u[IU]/mL (ref 0.350–4.500)

## 2021-06-04 LAB — CBC
HCT: 40.7 % (ref 39.0–52.0)
Hemoglobin: 13 g/dL (ref 13.0–17.0)
MCH: 28.7 pg (ref 26.0–34.0)
MCHC: 31.9 g/dL (ref 30.0–36.0)
MCV: 89.8 fL (ref 80.0–100.0)
Platelets: 263 10*3/uL (ref 150–400)
RBC: 4.53 MIL/uL (ref 4.22–5.81)
RDW: 15.8 % — ABNORMAL HIGH (ref 11.5–15.5)
WBC: 3.4 10*3/uL — ABNORMAL LOW (ref 4.0–10.5)
nRBC: 0 % (ref 0.0–0.2)

## 2021-06-04 LAB — T4, FREE: Free T4: 0.99 ng/dL (ref 0.61–1.12)

## 2021-06-04 LAB — BRAIN NATRIURETIC PEPTIDE: B Natriuretic Peptide: 133.7 pg/mL — ABNORMAL HIGH (ref 0.0–100.0)

## 2021-06-04 MED ORDER — METHOTREXATE 2.5 MG PO TABS
20.0000 mg | ORAL_TABLET | ORAL | 3 refills | Status: DC
Start: 2021-06-04 — End: 2021-09-24
  Filled 2021-06-04: qty 32, 28d supply, fill #0
  Filled 2021-07-01: qty 32, 28d supply, fill #1
  Filled 2021-07-29: qty 32, 28d supply, fill #2
  Filled 2021-08-20: qty 32, 28d supply, fill #3

## 2021-06-04 MED ORDER — AMIODARONE HCL 200 MG PO TABS
200.0000 mg | ORAL_TABLET | Freq: Every day | ORAL | 3 refills | Status: DC
  Filled 2021-06-04 (×2): qty 90, 90d supply, fill #0
  Filled 2021-08-28: qty 90, 90d supply, fill #1
  Filled 2021-11-26: qty 90, 90d supply, fill #2

## 2021-06-04 NOTE — Progress Notes (Signed)
?Advanced [facility] Failure Clinic Note  ? ?PCP: Hayden Rasmussen, MD ?PCP-Cardiologist: Skeet Latch, MD  ?HF Cardiologist: Dr. Haroldine Laws ? ?HPI: ?Mr Algis Downs is a 56 y.o. male with history of chronic systolic heart failure, mitral regurgitation, VT s/p ICD, cardiac sarcoidosis and peripheral focal chorioretinal inflammation of both eyes diagnosed in 2020 (followed at [facility]).   ?  ?Admitted 10/12/20 with increased shortness of breath. CT chest with bulky mediastinal lymphadenopathy. Pulmonary consulted, underwent bronchoscopy + biopsy. Path showed no malignancy. Echo showed EF 25 % with reduced RV. Underwent R/LHC with normal cors and stable hemodynamics. cMRI that showed EF 18% with LGE pattern consistent with cardiac sarcoidosis. GMDT limited by hypotension. He was on 1/2 tab Bidil + [place]. Placed on prednisone, metrotrexate, Bactrim for PJP. Had NSVT and high PVC burden and started on mexilitene 200 mg bid. Discharged with Life Vest due to frequent PVCs. ?  ?Re-admitted 10/25/20 after LifeVest alarmed several times. Had frequent runs of NSVT and > 25% PVC burden. He was placed on IV amiodarone with some suppression of PVCs. Mexitil was discontinued and IV amiodarone started, transitioned eventually  to PO. EP consulted and d/t extensive transmural LGE and severely reduced LVEF, ICD placed for primary prevention. Had hypotension with amiodarone gtt and was started on midodrine. GDMT limited by hypotension. Discharge weight 211-215 lbs. ? ?Saw Dr. Quentin Ore 12/22. Devices parameters ok. VT quiescent. Continued amio 200 daily.  ? ?Follow up 1/23, doing well, NYHA II symptoms and volume stable. Midodrine & PJP prophylaxis stopped and PET scan arranged.  ? ?Echo 02/07/21 EF 30-35% RV normal.  ? ?Today he returns for HF follow up with his wife. Feels ok. No dyspnea, orthopnea or PND. No edema. Not very active. Stopped prednisone about 1 month ago. Recently PCP said kidney function was getting worse.   ? ? ?Cardiac Studies: ?- Echo (1/23) EF 30-35%, RV normal. ? ?- Echo (9/22): EF 25%, reduced RV. ? ?- cMRI (9/22): EF 18% with LGE pattern consistent with cardiac sarcoidosis. ? ?- R/LHC (9/22): normal cors, mild to moderate pulmonary hypertension 2/2 to MR and elevated LVEDP from left-sided failure. ?PAP 46/19 mm with a mean of 33 mmHg.   ?PCWP 27 mmHg and LVEDP 30 mmHg.  In the setting of systolic pressures 99991111 mmHg ?Cardiac Output (Fick) 6.75-3.06.-Index ?SVR 10.96, PVR 0.9 (Wood units) ? ?Past Medical History:  ?Diagnosis Date  ? Allergies   ? 09/16/2020  ? BPH (benign prostatic hyperplasia)   ? ED (erectile dysfunction)   ? GERD (gastroesophageal reflux disease)   ? Peripheral focal chorioretinal inflammation of both eyes   ? Retinal vasculitis of both eyes   ? ?Current Outpatient Medications  ?Medication Sig Dispense Refill  ? amiodarone (PACERONE) 200 MG tablet Take 1 tablet (200 mg total) by mouth daily. 90 tablet 3  ? Blood Pressure Monitoring (OMRON 3 SERIES BP MONITOR) DEVI Use as directed 1 each 0  ? Calcium Carbonate-Vitamin D3 (CALCIUM 600+D) 600-400 MG-UNIT TABS Take 1 tablet by mouth 2 (two) times daily. 180 tablet 3  ? carvedilol (COREG) 3.125 MG tablet Take 1 tablet (3.125 mg total) by mouth 2 (two) times daily. 60 tablet 11  ? cetirizine (ZYRTEC) 10 MG tablet Take 10 mg by mouth daily.    ? dapagliflozin propanediol (FARXIGA) 10 MG TABS tablet Take 1 tablet (10 mg total) by mouth daily. 90 tablet 3  ? digoxin (LANOXIN) 0.125 MG tablet Take 0.5 tablets (0.0625 mg total) by mouth daily. 45 tablet  3  ? feeding supplement (ENSURE ENLIVE / ENSURE PLUS) LIQD Take 237 mLs by mouth 2 (two) times daily between meals. 123XX123 mL 12  ? folic acid (FOLVITE) 1 MG tablet Take 1 tablet (1 mg total) by mouth daily. 30 tablet 11  ? methotrexate (RHEUMATREX) 2.5 MG tablet Take 8 tablets (20 mg total) by mouth every Wednesday. Caution:Chemotherapy. Protect from light. 32 tablet 3  ? omeprazole (PRILOSEC) 40 MG  capsule Take 1 capsule (40 mg total) by mouth every morning. 90 capsule 1  ? sildenafil (REVATIO) 20 MG tablet Take 2 to 3 tablets  by mouth daily as needed. 50 tablet 0  ? spironolactone (ALDACTONE) 25 MG tablet Take 1 tablet (25 mg total) by mouth daily. 90 tablet 3  ? tamsulosin (FLOMAX) 0.4 MG CAPS capsule Take 2 capsules (0.8 mg total) by mouth daily. 60 capsule 0  ? Vitamin D, Ergocalciferol, (DRISDOL) 1.25 MG (50000 UNIT) CAPS capsule Take 1 capsule (50,000 Units total) by mouth once a week. 12 capsule 0  ? ?No current facility-administered medications for this encounter.  ? ?No Known Allergies ? ?Social History  ? ?Socioeconomic History  ? Marital status: Married  ?  Spouse name: Not on file  ? Number of children: Not on file  ? Years of education: Not on file  ? Highest education level: Not on file  ?Occupational History  ? Not on file  ?Tobacco Use  ? Smoking status: Former  ?  Packs/day: 0.50  ?  Types: Cigarettes  ?  Quit date: 02/02/2010  ?  Years since quitting: 11.3  ? Smokeless tobacco: Never  ?Substance and Sexual Activity  ? Alcohol use: Yes  ? Drug use: Not Currently  ? Sexual activity: Not on file  ?Other Topics Concern  ? Not on file  ?Social History Narrative  ? Not on file  ? ?Social Determinants of Health  ? ?Financial Resource Strain: Low Risk   ? Difficulty of Paying Living Expenses: Not very hard  ?Food Insecurity: No Food Insecurity  ? Worried About Charity fundraiser in the Last Year: Never true  ? Ran Out of Food in the Last Year: Never true  ?Transportation Needs: No Transportation Needs  ? Lack of Transportation (Medical): No  ? Lack of Transportation (Non-Medical): No  ?Physical Activity: Not on file  ?Stress: Not on file  ?Social Connections: Not on file  ?Intimate Partner Violence: Not on file  ? ?Family History  ?Problem Relation Age of Onset  ? Cancer Father 5  ? Diabetes Father   ? ?BP 110/70   Pulse 74   Wt 114.1 kg (251 lb 9.6 oz)   SpO2 100%   BMI 36.10 kg/m?  ? ?Wt  Readings from Last 3 Encounters:  ?06/04/21 114.1 kg (251 lb 9.6 oz)  ?04/09/21 114.9 kg (253 lb 6.4 oz)  ?03/12/21 112.9 kg (248 lb 12.8 oz)  ? ?PHYSICAL EXAM: ?General:  Well appearing. No resp difficulty ?HEENT: normal ?Neck: supple. no JVD. Carotids 2+ bilat; no bruits. No lymphadenopathy or thryomegaly appreciated. ?Cor: PMI nondisplaced. Regular rate & rhythm. No rubs, gallops or murmurs. ?Lungs: clear ?Abdomen: obese soft, nontender, nondistended. No hepatosplenomegaly. No bruits or masses. Good bowel sounds. ?Extremities: no cyanosis, clubbing, rash, edema ?Neuro: alert & orientedx3, cranial nerves grossly intact. moves all 4 extremities w/o difficulty. Affect pleasant ? ?ECG: NSR 74 No ST-T wave abnormalities. No PVCs. Personally reviewed ? ?ASSESSMENT & PLAN: ? ?1. Cardiac Sarcoid ?- cMRI 10/17/20 c/w cardiac  sarcoid. ?- Off prednisone since 2/23 ?- Continue MTX 20 mg q Wednesdays ?- Continue folic acid, Vit D + calcium and PPI. ?- S/p ICD placement 09/26. ?- Needs repeat PET scan to assess EF and sarcoid activity  ?- Check MTX labs today ? ?2. NSVT/ Frequent PVCs  ?- Previously on mexilitene 200 mg bid. Now on amio 200 daily ?- S/p Biotronik ICD placement 10/28/20.  ?- PVCs appear suppressed. SR on ECG today. No PVCs ?- Continue amiodarone 200 mg daily per EP.  ?  ?3. Chronic HFrEF--> NICM, cardiac sarcoid ?- Echo (9/22): EF 30-35%, RV ok, severe MR ?- R/LHC (9/22): normal cors, mild to moderate pulmonary hypertension 2/2 MR, preserved CO/CI. ?- Echo 02/07/21 EF 30-35% RV normal. ?- NYHA I-II  ?- GDMT titration limited previously by BP. Now off midodrine. ?- Continue carvedilol 3.125 mg bid. ?- Continue Farxiga 10 mg daily. ?- Continue sprionolactone 12.5 mg daily. ?- Stop digoxin ?- Would add losartan once we are sure renal function stable  ? ?4. CKD Stage IIIa ?- SCr baseline 1.4-1.8 ?- Labs today.  ?- Would add losartan once we are sure renal function stable  ?- labs today ?  ?5. Mediastinal  Lymphadenopathy ?- 10/17/20 bronchoscopy + biopsy.  ?- Path showed no malignancy or evidence of sarcoid. ? ?Glori Bickers, MD  ?2:52 PM ? ?

## 2021-06-04 NOTE — Patient Instructions (Signed)
Medication Changes: ? ?Stop Digoxin ? ?Lab Work: ? ?Labs done today, your results will be available in MyChart, we will contact you for abnormal readings. ? ? ?Testing/Procedures: ? ?Your provider wants you to have a PET SCAN at [facility]. ONCE APPROVED BY YOUR INSURANCE COMPANY YOU WILL BE CALLED TO ARRANGE THE TEST ? ?Referrals: ? ?none ? ?Special Instructions // Education: ? ?none ? ?Follow-Up in: 4 months  ? ?At the [facility], you and your health needs are our priority. We have a designated team specialized in the treatment of Heart Failure. This Care Team includes your primary Heart Failure Specialized Cardiologist (physician), Advanced Practice Providers (APPs- Physician Assistants and Nurse Practitioners), and Pharmacist who all work together to provide you with the care you need, when you need it.  ? ?You may see any of the following providers on your designated Care Team at your next follow up: ? ?Dr Glori Bickers ?Dr Loralie Champagne ?Darrick Grinder, NP ?Lyda Jester, PA ?Jessica Milford,NP ?Marlyce Huge, PA ?Audry Riles, PharmD ? ? ?Please be sure to bring in all your medications bottles to every appointment.  ? ?Need to Contact us: ? ?If you have any questions or concerns before your next appointment please send [place] a message through [place] or call our office at [phone].   ? ?TO LEAVE A MESSAGE FOR THE NURSE SELECT OPTION 2, PLEASE LEAVE A MESSAGE INCLUDING: ?YOUR NAME ?DATE OF BIRTH ?CALL BACK NUMBER ?REASON FOR CALL**this is important as we prioritize the call backs ? ?YOU WILL RECEIVE A CALL BACK THE SAME DAY AS LONG AS YOU CALL BEFORE 4:00 PM ? ? ?

## 2021-06-05 ENCOUNTER — Telehealth (HOSPITAL_COMMUNITY): Payer: Self-pay

## 2021-06-05 NOTE — Telephone Encounter (Signed)
Faxed sent to [facility] on 06/05/2021 @ 10.20am  ?

## 2021-06-13 ENCOUNTER — Other Ambulatory Visit: Payer: Self-pay | Admitting: Internal Medicine

## 2021-07-01 ENCOUNTER — Other Ambulatory Visit (HOSPITAL_COMMUNITY): Payer: Self-pay

## 2021-07-17 ENCOUNTER — Telehealth (HOSPITAL_COMMUNITY): Payer: Self-pay | Admitting: *Deleted

## 2021-07-29 ENCOUNTER — Ambulatory Visit (INDEPENDENT_AMBULATORY_CARE_PROVIDER_SITE_OTHER): Payer: 59

## 2021-07-29 ENCOUNTER — Other Ambulatory Visit (HOSPITAL_COMMUNITY): Payer: Self-pay

## 2021-07-29 DIAGNOSIS — I4729 Other ventricular tachycardia: Secondary | ICD-10-CM

## 2021-07-29 LAB — CUP PACEART REMOTE DEVICE CHECK
Date Time Interrogation Session: 20230627085610
Implantable Lead Implant Date: 20220926
Implantable Lead Location: 753860
Implantable Lead Model: 436910
Implantable Lead Serial Number: 81470915
Implantable Pulse Generator Implant Date: 20220926
Pulse Gen Model: 429525
Pulse Gen Serial Number: 84847461

## 2021-08-20 ENCOUNTER — Other Ambulatory Visit (HOSPITAL_COMMUNITY): Payer: Self-pay

## 2021-08-20 NOTE — Progress Notes (Signed)
Remote ICD transmission.   

## 2021-08-24 ENCOUNTER — Other Ambulatory Visit: Payer: Self-pay

## 2021-08-24 ENCOUNTER — Encounter (HOSPITAL_COMMUNITY): Payer: Self-pay

## 2021-08-24 ENCOUNTER — Emergency Department (HOSPITAL_COMMUNITY)
Admission: EM | Admit: 2021-08-24 | Discharge: 2021-08-24 | Disposition: A | Discharge status: Home / Self Care | Payer: Commercial Managed Care - HMO | Attending: Emergency Medicine | Admitting: Emergency Medicine

## 2021-08-24 ENCOUNTER — Emergency Department (HOSPITAL_COMMUNITY): Payer: Commercial Managed Care - HMO

## 2021-08-24 DIAGNOSIS — K4091 Unilateral inguinal hernia, without obstruction or gangrene, recurrent: Secondary | ICD-10-CM | POA: Insufficient documentation

## 2021-08-24 DIAGNOSIS — R102 Pelvic and perineal pain: Secondary | ICD-10-CM | POA: Diagnosis present

## 2021-08-24 LAB — URINALYSIS, ROUTINE W REFLEX MICROSCOPIC
Bilirubin Urine: NEGATIVE
Glucose, UA: NEGATIVE mg/dL
Hgb urine dipstick: NEGATIVE
Ketones, ur: NEGATIVE mg/dL
Leukocytes,Ua: NEGATIVE
Nitrite: NEGATIVE
Protein, ur: NEGATIVE mg/dL
Specific Gravity, Urine: 1.024 (ref 1.005–1.030)
pH: 6 (ref 5.0–8.0)

## 2021-08-24 MED ORDER — HYDROCODONE-ACETAMINOPHEN 5-325 MG PO TABS
2.0000 | ORAL_TABLET | Freq: Once | ORAL | Status: AC
  Administered 2021-08-24: 2 via ORAL
  Filled 2021-08-24: qty 2

## 2021-08-24 MED ORDER — HYDROCODONE-ACETAMINOPHEN 5-325 MG PO TABS: 1.0000 | ORAL_TABLET | Freq: Four times a day (QID) | ORAL | 0 refills | Status: AC | PRN

## 2021-08-24 NOTE — ED Provider Triage Note (Signed)
Emergency Medicine Provider Triage Evaluation Note  [facility] David Gibson. , a 56 y.o. male  was evaluated in triage.  Pt complains of left testicular pain onset 2 years.  Notes that this has been chronic.  Patient notes he was evaluated for his symptoms previously.  Does not have a primary care provider at this time.  Denies abdominal pain, nausea, vomiting, penile pain/swelling.   Per patient chart review: Patient was evaluated on 08/26/2020 for pain to the left testicle.  At that time it was noted mild hydrocele.  No hernia on exam.  Patient was informed to use ibuprofen 800 mg 3 times daily as needed.  Also informed to follow-up with primary care provider and urology referral.   Review of Systems  Positive: As per HPI Negative:   Physical Exam  BP (!) 140/98 (BP Location: Right Arm)   Pulse 92   Temp 98.5 F (36.9 C) (Oral)   Resp 18   Ht 5\' 7"  (1.702 m)   Wt 92.1 kg   SpO2 99%   BMI 31.79 kg/m  Gen:   Awake, no distress  Resp:  Normal effort  MSK:   Moves extremities without difficulty  Other:  No abdominal tenderness to palpation on exam.  GU exam deferred in triage.  Medical Decision Making  Medically screening exam initiated at 11:13 AM.  Appropriate orders placed.  . was informed that the remainder of the evaluation will be completed by another provider, this initial triage assessment does not replace that evaluation, and the importance of remaining in the ED until their evaluation is complete.  Work-up initiated   Sidharth Leverette A, PA-C 08/24/21 1121

## 2021-08-24 NOTE — Discharge Instructions (Addendum)
You have a small inguinal hernia.  Please take Motrin for pain and Vicodin for severe pain.  Avoid heavy lifting  Follow-up with surgery outpatient.  As we discussed, your ultrasound is normal right now.  You may use Anusol cream over-the-counter for your hemorrhoid  Return to ER if you have severe groin pain, vomiting, fever

## 2021-08-24 NOTE — ED Provider Notes (Signed)
[facility] EMERGENCY DEPARTMENT Provider Note   CSN: [id number] Arrival date & time: 08/24/21  1056     History  Chief Complaint  Patient presents with   Groin Pain    David Gibson. is a 56 y.o. male here presenting with groin pain.  Patient has left groin pain and testicular pain for the last year or so.  Patient states that it is worse when he works out.  He sometimes feels a pop in the left groin area but then it goes back in.  He had previous ultrasound done about a year ago that showed possible hydrocele.  Patient was given Motrin and was sent home.  Patient also states that he may have some hemorrhoids and wants to get his prostate checked  The history is provided by the patient.       Home Medications Prior to Admission medications   Medication Sig Start Date End Date Taking? Authorizing Provider  HYDROcodone-acetaminophen (NORCO/VICODIN) 5-325 MG tablet Take 1 tablet by mouth every 6 (six) hours as needed. 08/24/21  Yes Charlynne Pander, MD  lidocaine (LIDODERM) 5 % Place 1 patch onto the skin every 12 (twelve) hours. Remove & Discard patch within 12 hours or as directed by MD 07/06/19   Myra Rude, MD  naproxen (NAPROSYN) 500 MG tablet Take 1 tablet (500 mg total) by mouth 2 (two) times daily. 01/14/21   Horton, Mayer Masker, MD  oxyCODONE-acetaminophen (PERCOCET) 5-325 MG tablet Take 1-2 tablets by mouth every 4 (four) hours as needed for severe pain. 07/20/19   Myra Rude, MD  penicillin v potassium (VEETID) 500 MG tablet Take 1 tablet (500 mg total) by mouth 4 (four) times daily. 01/14/21   Horton, Mayer Masker, MD  amiodarone (PACERONE) 200 MG tablet Take 1 tablet (200 mg total) by mouth daily. 11/25/20 06/04/21  Bensimhon, Bevelyn Buckles, MD      Allergies    Patient has no known allergies.    Review of Systems   Review of Systems  Genitourinary:  Positive for scrotal swelling.  All other systems reviewed and are  negative.   Physical Exam Updated Vital Signs BP (!) 155/104   Pulse (!) 55   Temp 98.7 F (37.1 C) (Oral)   Resp 20   Ht 5\' 7"  (1.702 m)   Wt 92.1 kg   SpO2 100%   BMI 31.79 kg/m  Physical Exam Vitals and nursing note reviewed.  Constitutional:      Appearance: Normal appearance.  HENT:     Head: Normocephalic.     Nose: Nose normal.     Mouth/Throat:     Mouth: Mucous membranes are moist.  Eyes:     Extraocular Movements: Extraocular movements intact.     Pupils: Pupils are equal, round, and reactive to light.  Cardiovascular:     Rate and Rhythm: Normal rate and regular rhythm.     Pulses: Normal pulses.     Heart sounds: Normal heart sounds.  Pulmonary:     Effort: Pulmonary effort is normal.     Breath sounds: Normal breath sounds.  Abdominal:     General: Abdomen is flat.     Palpations: Abdomen is soft.     Comments: ?  Small left inguinal hernia that is easily reducible  Genitourinary:    Comments: Testicular exam normal.  No obvious scrotal swelling.  There is a small indirect hernia that is easily reducible. Musculoskeletal:     Cervical back:  Normal range of motion and neck supple.  Skin:    General: Skin is warm.     Capillary Refill: Capillary refill takes less than 2 seconds.  Neurological:     General: No focal deficit present.     Mental Status: He is alert and oriented to person, place, and time.  Psychiatric:        Mood and Affect: Mood normal.        Behavior: Behavior normal.     ED Results / Procedures / Treatments   Labs (all labs ordered are listed, but only abnormal results are displayed) Labs Reviewed  URINALYSIS, ROUTINE W REFLEX MICROSCOPIC  GC/CHLAMYDIA PROBE AMP ([place]) NOT AT [facility]    EKG None  Radiology US SCROTUM W/DOPPLER  Result Date: 08/24/2021 CLINICAL DATA:  Left testicle pain and swelling. EXAM: SCROTAL ULTRASOUND DOPPLER ULTRASOUND OF THE TESTICLES TECHNIQUE: Complete ultrasound examination of the  testicles, epididymis, and other scrotal structures was performed. Color and spectral Doppler ultrasound were also utilized to evaluate blood flow to the testicles. COMPARISON:  08/26/2020 FINDINGS: Right testicle Measurements: 4.0 x 2.7 x 2.3 cm. No mass or microlithiasis visualized. Left testicle Measurements: 4.4 x 2.3 x 2.7 cm. No mass or microlithiasis visualized. Right epididymis:  Normal in size and appearance. Left epididymis:  Normal in size and appearance. Hydrocele: Similar minimal fluid in each hemiscrotum, likely physiologic. Varicocele: Prominent venous anatomy identified in each hemiscrotum with vascular channels measuring up to 2.7 mm, borderline for varicocele. Pulsed Doppler interrogation of both testes demonstrates normal low resistance arterial and venous waveforms bilaterally. IMPRESSION: 1. No sonographic findings of testicular torsion or epididymo-orchitis. 2. Borderline bilateral varicocele. Electronically Signed   By: Kennith Center M.D.   On: 08/24/2021 13:05    Procedures Procedures    Medications Ordered in ED Medications  HYDROcodone-acetaminophen (NORCO/VICODIN) 5-325 MG per tablet 2 tablet (2 tablets Oral Given 08/24/21 1937)    ED Course/ Medical Decision Making/ A&P                           Medical Decision Making [facility] David Gibson. is a 56 y.o. male here presenting with left groin pain.  Likely inguinal hernia versus hydrocele.  Ultrasound did not show any hydrocele or torsion.  UA is normal.  Patient has small inguinal hernia that is easily reducible.  Patient states that he has some hemorrhoids and they recommend Anusol cream.  Will refer to surgery outpatient   Problems Addressed: Unilateral recurrent inguinal hernia without obstruction or gangrene: chronic illness or injury  Amount and/or Complexity of Data Reviewed Labs: ordered. Decision-making details documented in ED Course. Radiology: ordered and independent interpretation performed.  Decision-making details documented in ED Course.  Risk Prescription drug management.    Final Clinical Impression(s) / ED Diagnoses Final diagnoses:  Unilateral recurrent inguinal hernia without obstruction or gangrene    Rx / DC Orders ED Discharge Orders          Ordered    HYDROcodone-acetaminophen (NORCO/VICODIN) 5-325 MG tablet  Every 6 hours PRN        08/24/21 1942              Charlynne Pander, MD 08/24/21 [phone]

## 2021-08-24 NOTE — ED Triage Notes (Signed)
Reports groin swelling and left testicle swelling for 2 years and decided to come intoday bc he didn't have to work.  Also complains of hemorrhoids.

## 2021-08-25 ENCOUNTER — Other Ambulatory Visit (HOSPITAL_COMMUNITY): Payer: Self-pay

## 2021-08-28 ENCOUNTER — Other Ambulatory Visit (HOSPITAL_COMMUNITY): Payer: Self-pay

## 2021-09-10 ENCOUNTER — Other Ambulatory Visit (HOSPITAL_COMMUNITY): Payer: Self-pay

## 2021-09-10 MED ORDER — TAMSULOSIN HCL 0.4 MG PO CAPS
0.4000 mg | ORAL_CAPSULE | Freq: Every evening | ORAL | 0 refills | Status: DC
  Filled 2021-09-10: qty 90, 90d supply, fill #0

## 2021-09-24 ENCOUNTER — Other Ambulatory Visit (HOSPITAL_COMMUNITY): Payer: Self-pay | Admitting: Internal Medicine

## 2021-09-24 ENCOUNTER — Other Ambulatory Visit (HOSPITAL_COMMUNITY): Payer: Self-pay

## 2021-09-24 MED ORDER — METHOTREXATE 2.5 MG PO TABS
20.0000 mg | ORAL_TABLET | ORAL | 3 refills | Status: DC
  Filled 2021-09-24: qty 32, 28d supply, fill #0
  Filled 2021-10-22: qty 32, 28d supply, fill #1

## 2021-10-10 ENCOUNTER — Encounter (HOSPITAL_COMMUNITY): Payer: Self-pay | Admitting: Internal Medicine

## 2021-10-10 ENCOUNTER — Ambulatory Visit (HOSPITAL_COMMUNITY)
Admission: RE | Admit: 2021-10-10 | Discharge: 2021-10-10 | Disposition: A | Discharge status: Home / Self Care | Payer: 59 | Source: Ambulatory Visit | Attending: Internal Medicine | Admitting: Internal Medicine

## 2021-10-10 ENCOUNTER — Other Ambulatory Visit (HOSPITAL_COMMUNITY): Payer: Self-pay

## 2021-10-10 VITALS — BP 122/84 | HR 76 | Wt 237.4 lb

## 2021-10-10 DIAGNOSIS — I340 Nonrheumatic mitral (valve) insufficiency: Secondary | ICD-10-CM | POA: Insufficient documentation

## 2021-10-10 DIAGNOSIS — Z95810 Presence of automatic (implantable) cardiac defibrillator: Secondary | ICD-10-CM | POA: Insufficient documentation

## 2021-10-10 DIAGNOSIS — N1831 Chronic kidney disease, stage 3a: Secondary | ICD-10-CM | POA: Diagnosis not present

## 2021-10-10 DIAGNOSIS — D8689 Sarcoidosis of other sites: Secondary | ICD-10-CM | POA: Insufficient documentation

## 2021-10-10 DIAGNOSIS — Z7984 Long term (current) use of oral hypoglycemic drugs: Secondary | ICD-10-CM | POA: Diagnosis not present

## 2021-10-10 DIAGNOSIS — I493 Ventricular premature depolarization: Secondary | ICD-10-CM | POA: Diagnosis not present

## 2021-10-10 DIAGNOSIS — I472 Ventricular tachycardia, unspecified: Secondary | ICD-10-CM | POA: Diagnosis not present

## 2021-10-10 DIAGNOSIS — I272 Pulmonary hypertension, unspecified: Secondary | ICD-10-CM | POA: Diagnosis not present

## 2021-10-10 DIAGNOSIS — I5022 Chronic systolic (congestive) heart failure: Secondary | ICD-10-CM

## 2021-10-10 DIAGNOSIS — D869 Sarcoidosis, unspecified: Secondary | ICD-10-CM | POA: Diagnosis not present

## 2021-10-10 DIAGNOSIS — I428 Other cardiomyopathies: Secondary | ICD-10-CM | POA: Insufficient documentation

## 2021-10-10 DIAGNOSIS — R590 Localized enlarged lymph nodes: Secondary | ICD-10-CM | POA: Insufficient documentation

## 2021-10-10 DIAGNOSIS — Z79899 Other long term (current) drug therapy: Secondary | ICD-10-CM | POA: Insufficient documentation

## 2021-10-10 DIAGNOSIS — D8685 Sarcoid myocarditis: Secondary | ICD-10-CM

## 2021-10-10 LAB — COMPREHENSIVE METABOLIC PANEL
ALT: 47 U/L — ABNORMAL HIGH (ref 0–44)
AST: 55 U/L — ABNORMAL HIGH (ref 15–41)
Albumin: 3.5 g/dL (ref 3.5–5.0)
Alkaline Phosphatase: 73 U/L (ref 38–126)
Anion gap: 8 (ref 5–15)
BUN: 16 mg/dL (ref 6–20)
CO2: 23 mmol/L (ref 22–32)
Calcium: 9.1 mg/dL (ref 8.9–10.3)
Chloride: 109 mmol/L (ref 98–111)
Creatinine, Ser: 1.62 mg/dL — ABNORMAL HIGH (ref 0.61–1.24)
GFR, Estimated: 50 mL/min — ABNORMAL LOW (ref >60)
Glucose, Bld: 86 mg/dL (ref 70–99)
Potassium: 4.3 mmol/L (ref 3.5–5.1)
Sodium: 140 mmol/L (ref 135–145)
Total Bilirubin: 0.7 mg/dL (ref 0.3–1.2)
Total Protein: 6.5 g/dL (ref 6.5–8.1)

## 2021-10-10 LAB — CBC WITH DIFFERENTIAL/PLATELET
Abs Immature Granulocytes: 0.01 10*3/uL (ref 0.00–0.07)
Basophils Absolute: 0 10*3/uL (ref 0.0–0.1)
Basophils Relative: 0 %
Eosinophils Absolute: 0.2 10*3/uL (ref 0.0–0.5)
Eosinophils Relative: 5 %
HCT: 39.6 % (ref 39.0–52.0)
Hemoglobin: 12.2 g/dL — ABNORMAL LOW (ref 13.0–17.0)
Immature Granulocytes: 0 %
Lymphocytes Relative: 23 %
Lymphs Abs: 0.8 10*3/uL (ref 0.7–4.0)
MCH: 28.4 pg (ref 26.0–34.0)
MCHC: 30.8 g/dL (ref 30.0–36.0)
MCV: 92.1 fL (ref 80.0–100.0)
Monocytes Absolute: 0.5 10*3/uL (ref 0.1–1.0)
Monocytes Relative: 16 %
Neutro Abs: 1.9 10*3/uL (ref 1.7–7.7)
Neutrophils Relative %: 56 %
Platelets: 269 10*3/uL (ref 150–400)
RBC: 4.3 MIL/uL (ref 4.22–5.81)
RDW: 17.3 % — ABNORMAL HIGH (ref 11.5–15.5)
WBC: 3.4 10*3/uL — ABNORMAL LOW (ref 4.0–10.5)
nRBC: 0 % (ref 0.0–0.2)

## 2021-10-10 LAB — BRAIN NATRIURETIC PEPTIDE: B Natriuretic Peptide: 180.5 pg/mL — ABNORMAL HIGH (ref 0.0–100.0)

## 2021-10-10 MED ORDER — FUROSEMIDE 20 MG PO TABS
20.0000 mg | ORAL_TABLET | Freq: Every day | ORAL | 3 refills | Status: DC | PRN
  Filled 2021-10-10: qty 30, 30d supply, fill #0

## 2021-10-10 MED ORDER — POTASSIUM CHLORIDE CRYS ER 20 MEQ PO TBCR
20.0000 meq | EXTENDED_RELEASE_TABLET | Freq: Every day | ORAL | 3 refills | Status: DC | PRN
  Filled 2021-10-10: qty 30, 30d supply, fill #0

## 2021-10-10 MED ORDER — ENTRESTO 24-26 MG PO TABS
1.0000 | ORAL_TABLET | Freq: Two times a day (BID) | ORAL | 3 refills | Status: DC
  Filled 2021-10-10: qty 60, 30d supply, fill #0

## 2021-10-10 NOTE — Progress Notes (Signed)
ReDS Vest / Clip - 10/10/21 1200       ReDS Vest / Clip   Station Marker D    Ruler Value 31.5    ReDS Value Range High volume overload    ReDS Actual Value 42

## 2021-10-10 NOTE — Patient Instructions (Signed)
Medication Changes:  START Entresto 24/26 mg Twice daily   Take Furosemide 20 mg TODAY, then only AS NEEDED  Take Potassium 20 meq TODAY, then only AS NEEDED when you take Furosemide  Lab Work:  Labs done today, your results will be available in MyChart, we will contact you for abnormal readings.  Testing/Procedures:  none  Referrals:  none  Special Instructions // Education:  Do the following things EVERYDAY: Weigh yourself in the morning before breakfast. Write it down and keep it in a log. Take your medicines as prescribed Eat low salt foods--Limit salt (sodium) to 2000 mg per day.  Stay as active as you can everyday Limit all fluids for the day to less than 2 liters   Follow-Up in: 3 months  At the [facility], you and your health needs are our priority. We have a designated team specialized in the treatment of Heart Failure. This Care Team includes your primary Heart Failure Specialized Cardiologist (physician), Advanced Practice Providers (APPs- Physician Assistants and Nurse Practitioners), and Pharmacist who all work together to provide you with the care you need, when you need it.   You may see any of the following providers on your designated Care Team at your next follow up:  Dr. Arvilla Meres Dr. Marca Ancona Dr. Marcos Eke, NP Robbie Lis, [place] [facility] [place], [place] Brynda Peon, NP Karle Plumber, PharmD   Please be sure to bring in all your medications bottles to every appointment.   Need to Contact us:  If you have any questions or concerns before your next appointment please send [place] a message through [place] or call our office at [phone].    TO LEAVE A MESSAGE FOR THE NURSE SELECT OPTION 2, PLEASE LEAVE A MESSAGE INCLUDING: YOUR NAME DATE OF BIRTH CALL BACK NUMBER REASON FOR CALL**this is important as we prioritize the call backs  YOU WILL RECEIVE A CALL BACK THE SAME DAY AS LONG AS YOU  CALL BEFORE 4:00 PM

## 2021-10-10 NOTE — Progress Notes (Signed)
Advanced Heart Failure Clinic Note   PCP: Dois Davenport, MD PCP-Cardiologist: Chilton Si, MD  HF Cardiologist: Dr. Gala Romney  HPI: [name] is a 56 y.o. male with history of chronic systolic heart failure, mitral regurgitation, VT s/p ICD, cardiac sarcoidosis and peripheral focal chorioretinal inflammation of both eyes diagnosed in 2020 (followed at [facility] ophthalmology).     Admitted 10/12/20 with increased shortness of breath. CT chest with bulky mediastinal lymphadenopathy. Pulmonary consulted, underwent bronchoscopy + biopsy. Path showed no malignancy. Echo showed EF 25 % with reduced RV. Underwent R/LHC with normal cors and stable hemodynamics. cMRI that showed EF 18% with LGE pattern consistent with cardiac sarcoidosis. GMDT limited by hypotension. He was on 1/2 tab Bidil + [place]. Placed on prednisone, metrotrexate, Bactrim for PJP. Had NSVT and high PVC burden and started on mexilitene 200 mg bid. Discharged with Life Vest due to frequent PVCs.   Re-admitted 10/25/20 after LifeVest alarmed several times. Had frequent runs of NSVT and > 25% PVC burden. He was placed on IV amiodarone with some suppression of PVCs. Mexitil was discontinued and IV amiodarone started, transitioned eventually  to PO. EP consulted and d/t extensive transmural LGE and severely reduced LVEF, ICD placed for primary prevention. Had hypotension with amiodarone gtt and was started on midodrine. GDMT limited by hypotension. Discharge weight 211-215 lbs.  Saw Dr. Lalla Brothers 12/22. Devices parameters ok. VT quiescent. Continued amio 200 daily.   Follow up 1/23, doing well, NYHA II symptoms and volume stable. Midodrine & PJP prophylaxis stopped and PET scan arranged.   Echo 02/07/21 EF 30-35% RV normal.   Today he returns for HF follow up. He went for Cardiac Pet but he wasn't given the instructions. Rescheduled Sept 25 th at [facility]. Overall feeling fine. + Bendopnea.  Occasionally short of breath singing and  dancing. Denies PND/Orthopnea. Occasionally has chest pain. Says the pain goes away after he sits down. Appetite ok. Had a couple slices of pizza last night.  No fever or chills.  Taking all medications. His wife is a Engineer, civil (consulting) at [employer].   Cardiac Studies: - Echo (1/23) EF 30-35%, RV normal.  - Echo (9/22): EF 25%, reduced RV.  - cMRI (9/22): EF 18% with LGE pattern consistent with cardiac sarcoidosis.  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 PAP 46/19 mm with a mean of 33 mmHg.   PCWP 27 mmHg and LVEDP 30 mmHg.  In the setting of systolic pressures 90/20 mmHg Cardiac Output (Fick) 6.75-3.06.-Index SVR 10.96, PVR 0.9 (Wood units)  Past Medical History:  Diagnosis Date   Allergies    09/16/2020   BPH (benign prostatic hyperplasia)    ED (erectile dysfunction)    GERD (gastroesophageal reflux disease)    Peripheral focal chorioretinal inflammation of both eyes    Retinal vasculitis of both eyes    Current Outpatient Medications  Medication Sig Dispense Refill   amiodarone (PACERONE) 200 MG tablet Take 1 tablet (200 mg total) by mouth daily. 90 tablet 3   Blood Pressure Monitoring (OMRON 3 SERIES BP MONITOR) DEVI Use as directed 1 each 0   Calcium Carbonate-Vitamin D3 (CALCIUM 600+D) 600-400 MG-UNIT TABS Take 1 tablet by mouth 2 (two) times daily. 180 tablet 3   carvedilol (COREG) 3.125 MG tablet Take 1 tablet (3.125 mg total) by mouth 2 (two) times daily. 60 tablet 11   cetirizine (ZYRTEC) 10 MG tablet Take 10 mg by mouth  daily.     dapagliflozin propanediol (FARXIGA) 10 MG TABS tablet Take 1 tablet (10 mg total) by mouth daily. 90 tablet 3   folic acid (FOLVITE) 1 MG tablet Take 1 tablet (1 mg total) by mouth daily. 30 tablet 11   methotrexate (RHEUMATREX) 2.5 MG tablet Take 8 tablets (20 mg total) by mouth every Wednesday. Caution:Chemotherapy. Protect from light. 32 tablet 3   omeprazole (PRILOSEC)  40 MG capsule Take 1 capsule (40 mg total) by mouth every morning. 90 capsule 3   sildenafil (REVATIO) 20 MG tablet Take 2-3 tablets (40-60 mg total) by mouth as needed. 50 tablet 0   spironolactone (ALDACTONE) 25 MG tablet Take 1 tablet (25 mg total) by mouth daily. 90 tablet 3   tamsulosin (FLOMAX) 0.4 MG CAPS capsule Take 1 capsule (0.4 mg total) by mouth at bedtime. 90 capsule 0   No current facility-administered medications for this encounter.   No Known Allergies  Social History   Socioeconomic History   Marital status: Married    Spouse name: Not on file   Number of children: Not on file   Years of education: Not on file   Highest education level: Not on file  Occupational History   Not on file  Tobacco Use   Smoking status: Former    Packs/day: 0.50    Types: Cigarettes    Quit date: 02/02/2010    Years since quitting: 11.6   Smokeless tobacco: Never  Substance and Sexual Activity   Alcohol use: Yes   Drug use: Not Currently   Sexual activity: Not on file  Other Topics Concern   Not on file  Social History Narrative   Not on file   Social Determinants of Health   Financial Resource Strain: Low Risk  (10/21/2020)   Overall Financial Resource Strain (CARDIA)    Difficulty of Paying Living Expenses: Not very hard  Food Insecurity: No Food Insecurity (10/21/2020)   Hunger Vital Sign    Worried About Running Out of Food in the Last Year: Never true    Ran Out of Food in the Last Year: Never true  Transportation Needs: No Transportation Needs (10/21/2020)   PRAPARE - Administrator, Civil Service (Medical): No    Lack of Transportation (Non-Medical): No  Physical Activity: Not on file  Stress: Not on file  Social Connections: Not on file  Intimate Partner Violence: Not on file   Family History  Problem Relation Age of Onset   Cancer Father 37   Diabetes Father    BP 122/84   Pulse 76   Wt 107.7 kg (237 lb 6.4 oz)   SpO2 100%   BMI 34.06 kg/m    Wt Readings from Last 3 Encounters:  10/10/21 107.7 kg (237 lb 6.4 oz)  06/04/21 114.1 kg (251 lb 9.6 oz)  04/09/21 114.9 kg (253 lb 6.4 oz)   PHYSICAL EXAM: General:  Well appearing. No resp difficulty HEENT: normal Neck: supple. \JVP 7-8. Carotids 2+ bilat; no bruits. No lymphadenopathy or thryomegaly appreciated. Cor: PMI nondisplaced. Regular rate & rhythm. No rubs, gallops or murmurs. Lungs: clear Abdomen: soft, nontender, nondistended. No hepatosplenomegaly. No bruits or masses. Good bowel sounds. Extremities: no cyanosis, clubbing, rash, edema Neuro: alert & orientedx3, cranial nerves grossly intact. moves all 4 extremities w/o difficulty. Affect pleasant  ECG: SR 76 bpm    ASSESSMENT & PLAN:  1. Cardiac Sarcoid - cMRI 10/17/20 c/w cardiac sarcoid. - Off prednisone since 2/23 - Continue  MTX 20 mg q Wednesdays - Continue folic acid, Vit D + calcium and PPI. - S/p ICD placement 09/26. - Repeat PET pending to assess EF and sarcoid activity  - Check MTX labs today  2. NSVT/ Frequent PVCs  - Previously on mexilitene 200 mg bid. Now on amio 200 daily - S/p Biotronik ICD placement 10/28/20.  - PVCs appear suppressed. SR today with no PVCs.  - Continue amiodarone 200 mg daily per EP.    3. 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 - Echo 02/07/21 EF 30-35% RV normal. -Reds Clip 42%.  - GDMT titration limited previously by BP. Now off midodrine. - Continue carvedilol 3.125 mg bid. - Continue Farxiga 10 mg daily. - Continue sprionolactone 25 mg daily. - Add Entresto 24-26 mg twice a day. Discussed potential drip in BP.  - Give lasix 20 mg lasix + 20 meq potassium as needed.  - Check labs today.    4. CKD Stage IIIa - SCr baseline 1.4-1.8 -Check BMET   5. Mediastinal Lymphadenopathy - 10/17/20 bronchoscopy + biopsy.  - Path showed no  malignancy or evidence of sarcoid.  CBC, CMET, and BNP .   Follow up in 3 months   Amy Clegg NP-C   12:20 PM  Patient seen and examined with the above-signed Advanced Practice Provider and/or Housestaff. I personally reviewed laboratory data, imaging studies and relevant notes. I independently examined the patient and formulated the important aspects of the plan. I have edited the note to reflect any of my changes or salient points. I have personally discussed the plan with the patient and/or family.  Doing well NYHA II. Volume status mildly elevated. Remains on MTX 20 weekly. Off steroids. No ICD firing. [facility] at [facility] pending   General:  Well appearing. No resp difficulty HEENT: normal Neck: supple. JVP 7-8 Carotids 2+ bilat; no bruits. No lymphadenopathy or thryomegaly appreciated. Cor: PMI nondisplaced. Regular rate & rhythm. No rubs, gallops or murmurs. Lungs: clear Abdomen: obese soft, nontender, nondistended. No hepatosplenomegaly. No bruits or masses. Good bowel sounds. Extremities: no cyanosis, clubbing, rash, tr edema Neuro: alert & orientedx3, cranial nerves grossly intact. moves all 4 extremities w/o difficulty. Affect pleasant  Doing well. NYHA II. Add Entresto and prn lasix. Continue MTX. Await PET results. Check labs.  Glori Bickers, MD  4:07 PM

## 2021-10-13 ENCOUNTER — Other Ambulatory Visit (HOSPITAL_COMMUNITY): Payer: Self-pay

## 2021-10-20 ENCOUNTER — Other Ambulatory Visit (HOSPITAL_COMMUNITY): Payer: Self-pay

## 2021-10-20 MED ORDER — DOXYCYCLINE HYCLATE 100 MG PO CAPS
100.0000 mg | ORAL_CAPSULE | Freq: Two times a day (BID) | ORAL | 0 refills | Status: DC
  Filled 2021-10-20: qty 14, 7d supply, fill #0

## 2021-10-21 ENCOUNTER — Encounter (HOSPITAL_COMMUNITY): Payer: Self-pay | Admitting: Internal Medicine

## 2021-10-22 ENCOUNTER — Other Ambulatory Visit (HOSPITAL_COMMUNITY): Payer: Self-pay | Admitting: *Deleted

## 2021-10-22 ENCOUNTER — Other Ambulatory Visit (HOSPITAL_COMMUNITY): Payer: Self-pay

## 2021-10-22 MED ORDER — LOSARTAN POTASSIUM 25 MG PO TABS
25.0000 mg | ORAL_TABLET | Freq: Every day | ORAL | 3 refills | Status: DC
Start: 2021-10-22 — End: 2022-03-18
  Filled 2021-10-22: qty 30, 30d supply, fill #0
  Filled 2021-11-26: qty 30, 30d supply, fill #1
  Filled 2021-12-30: qty 30, 30d supply, fill #2
  Filled 2022-02-15: qty 30, 30d supply, fill #3

## 2021-10-28 ENCOUNTER — Ambulatory Visit (INDEPENDENT_AMBULATORY_CARE_PROVIDER_SITE_OTHER): Payer: 59

## 2021-10-28 DIAGNOSIS — I4729 Other ventricular tachycardia: Secondary | ICD-10-CM

## 2021-10-29 LAB — CUP PACEART REMOTE DEVICE CHECK
Date Time Interrogation Session: 20230926084402
Implantable Lead Implant Date: 20220926
Implantable Lead Location: 753860
Implantable Lead Model: 436910
Implantable Lead Serial Number: 81470915
Implantable Pulse Generator Implant Date: 20220926
Pulse Gen Model: 429525
Pulse Gen Serial Number: 84847461

## 2021-10-30 ENCOUNTER — Other Ambulatory Visit (HOSPITAL_COMMUNITY): Payer: Self-pay

## 2021-11-07 NOTE — Progress Notes (Signed)
Remote ICD transmission.   

## 2021-11-26 ENCOUNTER — Other Ambulatory Visit (HOSPITAL_COMMUNITY): Payer: Self-pay

## 2021-11-26 MED ORDER — METHOTREXATE SODIUM 2.5 MG PO TABS
20.0000 mg | ORAL_TABLET | ORAL | 1 refills | Status: DC
Start: 2021-09-24 — End: 2022-01-20
  Filled 2021-11-26: qty 32, 28d supply, fill #0
  Filled 2021-12-17: qty 32, 28d supply, fill #1

## 2021-12-17 ENCOUNTER — Other Ambulatory Visit (HOSPITAL_COMMUNITY): Payer: Self-pay

## 2021-12-17 MED ORDER — IBUPROFEN 800 MG PO TABS
800.0000 mg | ORAL_TABLET | Freq: Four times a day (QID) | ORAL | 0 refills | Status: DC
  Filled 2021-12-17: qty 30, 8d supply, fill #0

## 2021-12-17 MED ORDER — AMOXICILLIN 875 MG PO TABS
875.0000 mg | ORAL_TABLET | Freq: Two times a day (BID) | ORAL | 0 refills | Status: DC
  Filled 2021-12-17: qty 20, 10d supply, fill #0

## 2021-12-18 ENCOUNTER — Other Ambulatory Visit (HOSPITAL_COMMUNITY): Payer: Self-pay

## 2021-12-30 ENCOUNTER — Other Ambulatory Visit (HOSPITAL_COMMUNITY): Payer: Self-pay

## 2021-12-30 ENCOUNTER — Other Ambulatory Visit (HOSPITAL_COMMUNITY): Payer: Self-pay | Admitting: Internal Medicine

## 2021-12-31 ENCOUNTER — Other Ambulatory Visit (HOSPITAL_COMMUNITY): Payer: Self-pay

## 2021-12-31 MED ORDER — TAMSULOSIN HCL 0.4 MG PO CAPS
0.4000 mg | ORAL_CAPSULE | Freq: Every evening | ORAL | 0 refills | Status: DC
  Filled 2021-12-31: qty 90, 90d supply, fill #0

## 2022-01-01 ENCOUNTER — Other Ambulatory Visit (HOSPITAL_COMMUNITY): Payer: Self-pay

## 2022-01-01 MED ORDER — SILDENAFIL CITRATE 20 MG PO TABS
40.0000 mg | ORAL_TABLET | ORAL | 0 refills | Status: DC | PRN
  Filled 2022-01-01: qty 50, 17d supply, fill #0

## 2022-01-02 ENCOUNTER — Other Ambulatory Visit (HOSPITAL_COMMUNITY): Payer: Self-pay

## 2022-01-20 ENCOUNTER — Other Ambulatory Visit (HOSPITAL_COMMUNITY): Payer: Self-pay | Admitting: Internal Medicine

## 2022-01-21 ENCOUNTER — Other Ambulatory Visit (HOSPITAL_COMMUNITY): Payer: Self-pay

## 2022-01-21 MED ORDER — METHOTREXATE SODIUM 2.5 MG PO TABS
20.0000 mg | ORAL_TABLET | ORAL | 1 refills | Status: DC
  Filled 2022-01-21: qty 32, 28d supply, fill #0
  Filled 2022-02-15: qty 32, 28d supply, fill #1

## 2022-01-21 NOTE — Progress Notes (Signed)
Advanced Heart Failure Clinic Note   PCP: Dois Davenport, MD PCP-Cardiologist: Chilton Si, MD  HF Cardiologist: Dr. Gala Romney  HPI: [name] is a 56 y.o. male with history of chronic systolic heart failure, mitral regurgitation, VT s/p ICD, cardiac sarcoidosis and peripheral focal chorioretinal inflammation of both eyes diagnosed in 2020 (followed at [facility] ophthalmology).     Admitted 10/12/20 with increased shortness of breath. CT chest with bulky mediastinal lymphadenopathy. Pulmonary consulted, underwent bronchoscopy + biopsy. Path showed no malignancy. Echo showed EF 25 % with reduced RV. Underwent R/LHC with normal cors and stable hemodynamics. cMRI that showed EF 18% with LGE pattern consistent with cardiac sarcoidosis. GMDT limited by hypotension. He was on 1/2 tab Bidil + [place]. Placed on prednisone, metrotrexate, Bactrim for PJP. Had NSVT and high PVC burden and started on mexilitene 200 mg bid. Discharged with Life Vest due to frequent PVCs.   Re-admitted 10/25/20 after LifeVest alarmed several times. Had frequent runs of NSVT and > 25% PVC burden. He was placed on IV amiodarone with some suppression of PVCs. Mexitil was discontinued and IV amiodarone started, transitioned eventually  to PO. EP consulted and d/t extensive transmural LGE and severely reduced LVEF, ICD placed for primary prevention. Had hypotension with amiodarone gtt and was started on midodrine. GDMT limited by hypotension. Discharge weight 211-215 lbs.  Saw Dr. Lalla Brothers 12/22. Devices parameters ok. VT quiescent. Continued amio 200 daily.   Follow up 1/23, doing well, NYHA II symptoms and volume stable. Midodrine & PJP prophylaxis stopped and PET scan arranged.   Echo 02/07/21 EF 30-35% RV normal.   PET 9/23 1. There are few segments with hypermetabolic activity suggestive of an active inflammatory process in the LV. LVEF 26% 2. Approximately 5% of the left ventricle is involved with predominantly  mild/moderate hypermetabolic activity.  3. There is no evidence of abnormal metabolism in the right ventricle.  4.There are extensive areas of extracardiac hypermetabolic activity noted on the limited field of view, specifically "bulky"        bilateral hilar and mediastinal lymph nodes. If clinically indicated/concern for systemic disease, a whole body PET/CT        may be obtained for further evaluation.   Today he returns for HF follow up. Feels good. Denies SOB, cough, edema, orthopnea or PND. Remains on MTX 20 per week. Off steroids.   Cardiac Studies: - Echo (1/23) EF 30-35%, RV normal.  - Echo (9/22): EF 25%, reduced RV.  - cMRI (9/22): EF 18% with LGE pattern consistent with cardiac sarcoidosis.  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 PAP 46/19 mm with a mean of 33 mmHg.   PCWP 27 mmHg and LVEDP 30 mmHg.  In the setting of systolic pressures 90/20 mmHg Cardiac Output (Fick) 6.75-3.06.-Index SVR 10.96, PVR 0.9 (Wood units)  Past Medical History:  Diagnosis Date   Allergies    09/16/2020   BPH (benign prostatic hyperplasia)    ED (erectile dysfunction)    GERD (gastroesophageal reflux disease)    Peripheral focal chorioretinal inflammation of both eyes    Retinal vasculitis of both eyes    Current Outpatient Medications  Medication Sig Dispense Refill   amiodarone (PACERONE) 200 MG tablet Take 1 tablet (200 mg total) by mouth daily. 90 tablet 3   Blood Pressure Monitoring (OMRON 3 SERIES BP MONITOR) DEVI Use as directed 1 each 0   carvedilol (COREG) 3.125 MG tablet  Take 1 tablet (3.125 mg total) by mouth 2 (two) times daily. 60 tablet 11   cetirizine (ZYRTEC) 10 MG tablet Take 10 mg by mouth daily.     dapagliflozin propanediol (FARXIGA) 10 MG TABS tablet Take 1 tablet (10 mg total) by mouth daily. 90 tablet 3   furosemide (LASIX) 20 MG tablet Take 1 tablet (20 mg total) by mouth daily as  needed. 30 tablet 3   ibuprofen (ADVIL) 800 MG tablet Take 1 tablet (800 mg total) by mouth every 6 (six) to 8(eight) hours not to exceed 3200mg /day 30 tablet 0   losartan (COZAAR) 25 MG tablet Take 1 tablet (25 mg total) by mouth daily. 30 tablet 3   methotrexate (RHEUMATREX) 2.5 MG tablet Take 8 tablets (20 mg total) by mouth as directed (every Wednesday). Caution: Chemotherapy, protect from light. 32 tablet 1   omeprazole (PRILOSEC) 40 MG capsule Take 1 capsule (40 mg total) by mouth every morning. 90 capsule 3   potassium chloride SA (KLOR-CON M) 20 MEQ tablet Take 1 tablet (20 mEq total) by mouth daily as needed. 30 tablet 3   sildenafil (REVATIO) 20 MG tablet Take 2-3 tablets (40-60 mg total) by mouth as needed. 50 tablet 0   spironolactone (ALDACTONE) 25 MG tablet Take 1 tablet (25 mg total) by mouth daily. 90 tablet 3   tamsulosin (FLOMAX) 0.4 MG CAPS capsule Take 1 capsule (0.4 mg total) by mouth at bedtime. 90 capsule 0   No current facility-administered medications for this encounter.   No Known Allergies  Social History   Socioeconomic History   Marital status: Married    Spouse name: Not on file   Number of children: Not on file   Years of education: Not on file   Highest education level: Not on file  Occupational History   Not on file  Tobacco Use   Smoking status: Former    Packs/day: 0.50    Types: Cigarettes    Quit date: 02/02/2010    Years since quitting: 11.9   Smokeless tobacco: Never  Substance and Sexual Activity   Alcohol use: Yes   Drug use: Not Currently   Sexual activity: Not on file  Other Topics Concern   Not on file  Social History Narrative   Not on file   Social Determinants of Health   Financial Resource Strain: Low Risk  (10/21/2020)   Overall Financial Resource Strain (CARDIA)    Difficulty of Paying Living Expenses: Not very hard  Food Insecurity: No Food Insecurity (10/21/2020)   Hunger Vital Sign    Worried About Running Out of Food in  the Last Year: Never true    Ran Out of Food in the Last Year: Never true  Transportation Needs: No Transportation Needs (10/21/2020)   PRAPARE - 10/23/2020 (Medical): No    Lack of Transportation (Non-Medical): No  Physical Activity: Not on file  Stress: Not on file  Social Connections: Not on file  Intimate Partner Violence: Not on file   Family History  Problem Relation Age of Onset   Cancer Father 39   Diabetes Father    BP (!) 92/58   Pulse 62   Wt 106.7 kg (235 lb 3.2 oz)   SpO2 99%   BMI 33.75 kg/m   Wt Readings from Last 3 Encounters:  01/22/22 106.7 kg (235 lb 3.2 oz)  10/10/21 107.7 kg (237 lb 6.4 oz)  06/04/21 114.1 kg (251 lb 9.6 oz)  PHYSICAL EXAM: General:  Well appearing. No resp difficulty HEENT: normal Neck: supple. no JVD. Carotids 2+ bilat; no bruits. No lymphadenopathy or thryomegaly appreciated. Cor: PMI nondisplaced. Regular rate & rhythm. No rubs, gallops or murmurs. Lungs: clear Abdomen: soft, nontender, nondistended. No hepatosplenomegaly. No bruits or masses. Good bowel sounds. Extremities: no cyanosis, clubbing, rash, edema Neuro: alert & orientedx3, cranial nerves grossly intact. moves all 4 extremities w/o difficulty. Affect pleasant   ASSESSMENT & PLAN:  1. Cardiac Sarcoid - cMRI 10/17/20 c/w cardiac sarcoid. - Off prednisone since 2/23 - Continue MTX 20 mg q Wednesdays - Continue folic acid, Vit D + calcium and PPI. - S/p ICD placement 09/26. - PET 9/23 LVEF 26% Mild FDG uptake in LV. Diffuse extracardiac FDG uptake suggestive of acitve systemic sarcoid - Continue MTX - I spoke with Pulmonary (Dr. Tonia Brooms) and Rheum (Dr. Dierdre Forth) to discuss re starting steroids versus steroid-sparing agent. Will check inflammatory markers and refer to Dr. Dierdre Forth   2. NSVT/ Frequent PVCs  - Previously on mexilitene 200 mg bid. Now on amio 200 daily - S/p Biotronik ICD placement 10/28/20.  - PVCs appear suppressed. SR today  with no PVCs.  - Continue amiodarone 200 mg daily per EP.  - ICD interrogated in Clinic No VT,    3. 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 - Echo 02/07/21 EF 30-35% RV normal. - PET scan 9/23  EF 26%  - Improved NYHA II - Volume status ok  - Continue carvedilol 3.125 mg bid. - Continue Farxiga 10 mg daily. - Continue sprionolactone 25 mg daily. - Continue Entresto 24-26 mg bid  - Further GDMT titration limited by low BP  4. CKD Stage IIIa - SCr baseline 1.4-1.8 - Check BMET   5. Mediastinal Lymphadenopathy - 10/17/20 bronchoscopy + biopsy.  - Path showed no malignancy or evidence of sarcoid.  - PET 9/23 suggestive of diffuse active sarcoid - I spoke with Pulmonary (Dr. Tonia Brooms) and Rheum (Dr. Dierdre Forth) to discuss re starting steroids versus steroid-sparing agent. Will check inflammatory markers and refer to Dr. Nolberto Hanlon MD  4:44 PM

## 2022-01-22 ENCOUNTER — Ambulatory Visit (HOSPITAL_COMMUNITY)
Admission: RE | Admit: 2022-01-22 | Discharge: 2022-01-22 | Disposition: A | Discharge status: Home / Self Care | Payer: 59 | Source: Ambulatory Visit | Attending: Internal Medicine | Admitting: Internal Medicine

## 2022-01-22 ENCOUNTER — Encounter (HOSPITAL_COMMUNITY): Payer: Self-pay | Admitting: Internal Medicine

## 2022-01-22 VITALS — BP 92/58 | HR 62 | Wt 235.2 lb

## 2022-01-22 DIAGNOSIS — I493 Ventricular premature depolarization: Secondary | ICD-10-CM | POA: Diagnosis not present

## 2022-01-22 DIAGNOSIS — N1831 Chronic kidney disease, stage 3a: Secondary | ICD-10-CM | POA: Diagnosis not present

## 2022-01-22 DIAGNOSIS — Z79899 Other long term (current) drug therapy: Secondary | ICD-10-CM | POA: Diagnosis not present

## 2022-01-22 DIAGNOSIS — I5022 Chronic systolic (congestive) heart failure: Secondary | ICD-10-CM | POA: Insufficient documentation

## 2022-01-22 DIAGNOSIS — I428 Other cardiomyopathies: Secondary | ICD-10-CM | POA: Diagnosis not present

## 2022-01-22 DIAGNOSIS — Z95810 Presence of automatic (implantable) cardiac defibrillator: Secondary | ICD-10-CM | POA: Diagnosis not present

## 2022-01-22 DIAGNOSIS — D8685 Sarcoid myocarditis: Secondary | ICD-10-CM | POA: Diagnosis not present

## 2022-01-22 DIAGNOSIS — I272 Pulmonary hypertension, unspecified: Secondary | ICD-10-CM | POA: Insufficient documentation

## 2022-01-22 DIAGNOSIS — I472 Ventricular tachycardia, unspecified: Secondary | ICD-10-CM | POA: Diagnosis not present

## 2022-01-22 DIAGNOSIS — D860 Sarcoidosis of lung: Secondary | ICD-10-CM | POA: Diagnosis not present

## 2022-01-22 DIAGNOSIS — I340 Nonrheumatic mitral (valve) insufficiency: Secondary | ICD-10-CM | POA: Insufficient documentation

## 2022-01-22 DIAGNOSIS — D869 Sarcoidosis, unspecified: Secondary | ICD-10-CM | POA: Diagnosis not present

## 2022-01-22 DIAGNOSIS — R590 Localized enlarged lymph nodes: Secondary | ICD-10-CM | POA: Diagnosis not present

## 2022-01-22 LAB — COMPREHENSIVE METABOLIC PANEL
ALT: 13 U/L (ref 0–44)
AST: 19 U/L (ref 15–41)
Albumin: 3.6 g/dL (ref 3.5–5.0)
Alkaline Phosphatase: 59 U/L (ref 38–126)
Anion gap: 5 (ref 5–15)
BUN: 13 mg/dL (ref 6–20)
CO2: 26 mmol/L (ref 22–32)
Calcium: 9.1 mg/dL (ref 8.9–10.3)
Chloride: 107 mmol/L (ref 98–111)
Creatinine, Ser: 1.83 mg/dL — ABNORMAL HIGH (ref 0.61–1.24)
GFR, Estimated: 43 mL/min — ABNORMAL LOW (ref >60)
Glucose, Bld: 91 mg/dL (ref 70–99)
Potassium: 5.4 mmol/L — ABNORMAL HIGH (ref 3.5–5.1)
Sodium: 138 mmol/L (ref 135–145)
Total Bilirubin: 0.7 mg/dL (ref 0.3–1.2)
Total Protein: 6.4 g/dL — ABNORMAL LOW (ref 6.5–8.1)

## 2022-01-22 LAB — C-REACTIVE PROTEIN: CRP: 1 mg/dL — ABNORMAL HIGH (ref <1.0)

## 2022-01-22 LAB — SEDIMENTATION RATE: Sed Rate: 2 mm/hr (ref 0–16)

## 2022-01-22 LAB — BRAIN NATRIURETIC PEPTIDE: B Natriuretic Peptide: 109.8 pg/mL — ABNORMAL HIGH (ref 0.0–100.0)

## 2022-01-22 NOTE — Patient Instructions (Signed)
Labs done today. We will contact you only if your labs are abnormal.  No medication changes were made. Please continue all current medications as prescribed.  You have been referred to Rheumatology. They will contact you to schedule an appointment.   Your physician recommends that you schedule a follow-up appointment in: 4 months. Please contact our office in March 2024 to schedule a April 2024 appointment.   If you have any questions or concerns before your next appointment please send [place] a message through [place] or call our office at [phone].    TO LEAVE A MESSAGE FOR THE NURSE SELECT OPTION 2, PLEASE LEAVE A MESSAGE INCLUDING: YOUR NAME DATE OF BIRTH CALL BACK NUMBER REASON FOR CALL**this is important as we prioritize the call backs  YOU WILL RECEIVE A CALL BACK THE SAME DAY AS LONG AS YOU CALL BEFORE 4:00 PM   Do the following things EVERYDAY: Weigh yourself in the morning before breakfast. Write it down and keep it in a log. Take your medicines as prescribed Eat low salt foods--Limit salt (sodium) to 2000 mg per day.  Stay as active as you can everyday Limit all fluids for the day to less than 2 liters   At the [facility], you and your health needs are our priority. As part of our continuing mission to provide you with exceptional heart care, we have created designated Provider Care Teams. These Care Teams include your primary Cardiologist (physician) and Advanced Practice Providers (APPs- Physician Assistants and Nurse Practitioners) who all work together to provide you with the care you need, when you need it.   You may see any of the following providers on your designated Care Team at your next follow up: Dr Arvilla Meres Dr Carron Curie, NP Robbie Lis, [place] Karle Plumber, PharmD   Please be sure to bring in all your medications bottles to every appointment.

## 2022-01-27 ENCOUNTER — Ambulatory Visit (INDEPENDENT_AMBULATORY_CARE_PROVIDER_SITE_OTHER): Payer: 59

## 2022-01-27 DIAGNOSIS — I5041 Acute combined systolic (congestive) and diastolic (congestive) heart failure: Secondary | ICD-10-CM

## 2022-01-27 LAB — CUP PACEART REMOTE DEVICE CHECK
Date Time Interrogation Session: 20231224075356
Implantable Lead Connection Status: 753985
Implantable Lead Implant Date: 20220926
Implantable Lead Location: 753860
Implantable Lead Model: 436910
Implantable Lead Serial Number: 81470915
Implantable Pulse Generator Implant Date: 20220926
Pulse Gen Model: 429525
Pulse Gen Serial Number: 84847461

## 2022-02-19 NOTE — Progress Notes (Signed)
Remote ICD transmission.   

## 2022-03-09 NOTE — Progress Notes (Unsigned)
Electrophysiology Office Note Date: 03/09/2022  ID:  Nathan [name], DOB 1965/12/24, MRN [id number]  PCP: Hayden Rasmussen, MD Primary Cardiologist: Skeet Latch, MD Electrophysiologist: Vickie Epley, MD   CC: Routine ICD follow-up  Nathan [name] is a 57 y.o. male seen today for Vickie Epley, MD for routine electrophysiology followup. Since last being seen in our clinic the patient reports doing well overall.  he denies chest pain, palpitations, dyspnea, PND, orthopnea, nausea, vomiting, dizziness, syncope, edema, weight gain, or early satiety.   He has not had ICD shocks.   Device History: Biotronik Single Chamber ICD implanted 2022 for CHF and sarcoidosis  Past Medical History:  Diagnosis Date   Allergies    09/16/2020   BPH (benign prostatic hyperplasia)    ED (erectile dysfunction)    GERD (gastroesophageal reflux disease)    Peripheral focal chorioretinal inflammation of both eyes    Retinal vasculitis of both eyes     Current Outpatient Medications  Medication Instructions   amiodarone (PACERONE) 200 mg, Oral, Daily   Blood Pressure Monitoring (OMRON 3 SERIES BP MONITOR) DEVI Use as directed   carvedilol (COREG) 3.125 mg, Oral, 2 times daily   cetirizine (ZYRTEC) 10 mg, Oral, Daily   Farxiga 10 mg, Oral, Daily   furosemide (LASIX) 20 mg, Oral, Daily PRN   ibuprofen (ADVIL) 800 MG tablet Take 1 tablet (800 mg total) by mouth every 6 (six) to 8(eight) hours not to exceed 3200mg /day   losartan (COZAAR) 25 mg, Oral, Daily   methotrexate (RHEUMATREX) 2.5 MG tablet Take 8 tablets (20 mg total) by mouth as directed (every Wednesday). Caution: Chemotherapy, protect from light.   omeprazole (PRILOSEC) 40 mg, Oral, Every morning   potassium chloride SA (KLOR-CON M) 20 MEQ tablet 20 mEq, Oral, Daily PRN   sildenafil (REVATIO) 40-60 mg, Oral, As needed   spironolactone (ALDACTONE) 25 mg, Oral, Daily   tamsulosin (FLOMAX) 0.4 mg, Oral, Nightly    Family  History: Family History  Problem Relation Age of Onset   Cancer Father 71   Diabetes Father     Physical Exam: Vitals:   03/10/22 0954  BP: 108/62  Pulse: 67  SpO2: 98%  Weight: 236 lb (107 kg)  Height: 5\' 10"  (1.778 m)     GEN- NAD. A&O x 3. Normal affect. HEENT: Normocephalic, atraumatic Lungs- CTAB, Normal effort.  Heart- Regular rate and rhythm rate and rhythm. No M/G/R.  Extremities- No peripheral edema. no clubbing or cyanosis Skin- warm and dry, no rash or lesion; ICD pocket well healed  ICD interrogation- reviewed in detail today,  See PACEART report  EKG is not ordered today. EKG from 10/2021 reviewed which showed NSR  at 76 bpm  Other studies Reviewed: Additional studies/ records that were reviewed today include: Previous EP office notes.    Assessment and Plan:  1.  Chronic systolic dysfunction s/p Biotronik single chamber ICD  2. Cardiac sarcoidosis euvolemic today Stable on an appropriate medical regimen Normal ICD function See Pace Art report No changes today Has been referred to Rheumatology   3. H/o VT Decrease amiodarone to 200 mg Monday through Friday for total of 1g weekly Labs today.   Current medicines are reviewed at length with the patient today.    Labs/ tests ordered today include:  Orders Placed This Encounter  Procedures   Comprehensive metabolic panel   TSH   T4, free     Disposition:   Follow up with Dr. Quentin Ore  in 6 months   Signed, Annamaria Helling  03/09/2022 8:17 AM  [facility] [address] [place]  [address] [phone] (office) [phone] (fax)

## 2022-03-10 ENCOUNTER — Ambulatory Visit: Payer: Commercial Managed Care - PPO | Attending: Student | Admitting: Student

## 2022-03-10 ENCOUNTER — Encounter: Payer: Self-pay | Admitting: Student

## 2022-03-10 ENCOUNTER — Other Ambulatory Visit (HOSPITAL_COMMUNITY): Payer: Self-pay

## 2022-03-10 VITALS — BP 108/62 | HR 67 | Ht 70.0 in | Wt 236.0 lb

## 2022-03-10 DIAGNOSIS — I4729 Other ventricular tachycardia: Secondary | ICD-10-CM | POA: Diagnosis not present

## 2022-03-10 DIAGNOSIS — D869 Sarcoidosis, unspecified: Secondary | ICD-10-CM

## 2022-03-10 DIAGNOSIS — I5022 Chronic systolic (congestive) heart failure: Secondary | ICD-10-CM | POA: Diagnosis not present

## 2022-03-10 LAB — CUP PACEART INCLINIC DEVICE CHECK
Date Time Interrogation Session: 20240206102234
Implantable Lead Connection Status: 753985
Implantable Lead Implant Date: 20220926
Implantable Lead Location: 753860
Implantable Lead Model: 436910
Implantable Lead Serial Number: 81470915
Implantable Pulse Generator Implant Date: 20220926
Pulse Gen Model: 429525
Pulse Gen Serial Number: 84847461

## 2022-03-10 LAB — COMPREHENSIVE METABOLIC PANEL
ALT: 26 IU/L (ref 0–44)
AST: 21 IU/L (ref 0–40)
Albumin/Globulin Ratio: 1.8 (ref 1.2–2.2)
Albumin: 4.1 g/dL (ref 3.8–4.9)
Alkaline Phosphatase: 74 IU/L (ref 44–121)
BUN/Creatinine Ratio: 10 (ref 9–20)
BUN: 16 mg/dL (ref 6–24)
Bilirubin Total: 0.3 mg/dL (ref 0.0–1.2)
CO2: 24 mmol/L (ref 20–29)
Calcium: 9 mg/dL (ref 8.7–10.2)
Chloride: 107 mmol/L — ABNORMAL HIGH (ref 96–106)
Creatinine, Ser: 1.53 mg/dL — ABNORMAL HIGH (ref 0.76–1.27)
Globulin, Total: 2.3 g/dL (ref 1.5–4.5)
Glucose: 73 mg/dL (ref 70–99)
Potassium: 4.8 mmol/L (ref 3.5–5.2)
Sodium: 141 mmol/L (ref 134–144)
Total Protein: 6.4 g/dL (ref 6.0–8.5)
eGFR: 54 mL/min/{1.73_m2} — ABNORMAL LOW (ref >59)

## 2022-03-10 LAB — TSH: TSH: 0.544 u[IU]/mL (ref 0.450–4.500)

## 2022-03-10 LAB — T4, FREE: Free T4: 1.51 ng/dL (ref 0.82–1.77)

## 2022-03-10 MED ORDER — AMIODARONE HCL 200 MG PO TABS
200.0000 mg | ORAL_TABLET | ORAL | 3 refills | Status: DC
  Filled 2022-03-10: qty 60, 84d supply, fill #0

## 2022-03-10 NOTE — Patient Instructions (Signed)
Medication Instructions:  Your physician has recommended you make the following change in your medication:   Take Amiodarone 200mg  daily Monday - Friday  *If you need a refill on your cardiac medications before your next appointment, please call your pharmacy*   Lab Work: TODAY: CMET, TSH, FreeT4  If you have labs (blood work) drawn today and your tests are completely normal, you will receive your results only by: [place] (if you have MyChart) OR A paper copy in the mail If you have any lab test that is abnormal or we need to change your treatment, we will call you to review the results.  Follow-Up: At [facility], you and your health needs are our priority.  As part of our continuing mission to provide you with exceptional heart care, we have created designated Provider Care Teams.  These Care Teams include your primary Cardiologist (physician) and Advanced Practice Providers (APPs -  Physician Assistants and Nurse Practitioners) who all work together to provide you with the care you need, when you need it.   Your next appointment:   6 month(s)  Provider:   Lars Mage, MD

## 2022-03-18 ENCOUNTER — Other Ambulatory Visit (HOSPITAL_COMMUNITY): Payer: Self-pay | Admitting: Internal Medicine

## 2022-03-18 ENCOUNTER — Other Ambulatory Visit (HOSPITAL_COMMUNITY): Payer: Self-pay

## 2022-03-18 ENCOUNTER — Other Ambulatory Visit: Payer: Self-pay

## 2022-03-18 MED ORDER — OMEPRAZOLE 40 MG PO CPDR
40.0000 mg | DELAYED_RELEASE_CAPSULE | Freq: Every morning | ORAL | 3 refills | Status: DC
  Filled 2022-03-18: qty 90, 90d supply, fill #0
  Filled 2022-06-30: qty 30, 30d supply, fill #1
  Filled 2022-08-04: qty 30, 30d supply, fill #2
  Filled 2022-09-04: qty 30, 30d supply, fill #3
  Filled 2022-10-13: qty 30, 30d supply, fill #4
  Filled 2022-11-20: qty 30, 30d supply, fill #5
  Filled 2022-12-29: qty 30, 30d supply, fill #6
  Filled 2023-02-05: qty 90, 90d supply, fill #7

## 2022-03-18 MED ORDER — DAPAGLIFLOZIN PROPANEDIOL 10 MG PO TABS
10.0000 mg | ORAL_TABLET | Freq: Every day | ORAL | 3 refills | Status: DC
  Filled 2022-03-18: qty 90, 90d supply, fill #0
  Filled 2022-06-05: qty 90, 90d supply, fill #1
  Filled 2022-09-12: qty 90, 90d supply, fill #2
  Filled 2022-12-12: qty 90, 90d supply, fill #3

## 2022-03-18 MED ORDER — LOSARTAN POTASSIUM 25 MG PO TABS
25.0000 mg | ORAL_TABLET | Freq: Every day | ORAL | 3 refills | Status: DC
  Filled 2022-03-18: qty 30, 30d supply, fill #0

## 2022-03-18 MED ORDER — METHOTREXATE SODIUM 2.5 MG PO TABS
20.0000 mg | ORAL_TABLET | ORAL | 1 refills | Status: DC
  Filled 2022-03-18: qty 32, 28d supply, fill #0
  Filled 2022-04-13: qty 32, 28d supply, fill #1

## 2022-03-26 DIAGNOSIS — K4090 Unilateral inguinal hernia, without obstruction or gangrene, not specified as recurrent: Secondary | ICD-10-CM | POA: Diagnosis not present

## 2022-04-06 ENCOUNTER — Other Ambulatory Visit (HOSPITAL_COMMUNITY): Payer: Self-pay

## 2022-04-06 MED ORDER — TAMSULOSIN HCL 0.4 MG PO CAPS
0.4000 mg | ORAL_CAPSULE | Freq: Every day | ORAL | 0 refills | Status: DC
  Filled 2022-04-06: qty 90, 90d supply, fill #0

## 2022-04-07 ENCOUNTER — Other Ambulatory Visit (HOSPITAL_COMMUNITY): Payer: Self-pay

## 2022-04-09 DIAGNOSIS — I5042 Chronic combined systolic (congestive) and diastolic (congestive) heart failure: Secondary | ICD-10-CM | POA: Diagnosis not present

## 2022-04-09 DIAGNOSIS — Z0001 Encounter for general adult medical examination with abnormal findings: Secondary | ICD-10-CM | POA: Diagnosis not present

## 2022-04-09 DIAGNOSIS — R109 Unspecified abdominal pain: Secondary | ICD-10-CM | POA: Diagnosis not present

## 2022-04-09 DIAGNOSIS — E559 Vitamin D deficiency, unspecified: Secondary | ICD-10-CM | POA: Diagnosis not present

## 2022-04-09 DIAGNOSIS — D8685 Sarcoid myocarditis: Secondary | ICD-10-CM | POA: Diagnosis not present

## 2022-04-09 DIAGNOSIS — N183 Chronic kidney disease, stage 3 unspecified: Secondary | ICD-10-CM | POA: Diagnosis not present

## 2022-04-09 DIAGNOSIS — E118 Type 2 diabetes mellitus with unspecified complications: Secondary | ICD-10-CM | POA: Diagnosis not present

## 2022-04-09 DIAGNOSIS — E785 Hyperlipidemia, unspecified: Secondary | ICD-10-CM | POA: Diagnosis not present

## 2022-04-09 DIAGNOSIS — D869 Sarcoidosis, unspecified: Secondary | ICD-10-CM | POA: Diagnosis not present

## 2022-04-10 DIAGNOSIS — D128 Benign neoplasm of rectum: Secondary | ICD-10-CM | POA: Diagnosis not present

## 2022-04-10 DIAGNOSIS — D123 Benign neoplasm of transverse colon: Secondary | ICD-10-CM | POA: Diagnosis not present

## 2022-04-10 DIAGNOSIS — K635 Polyp of colon: Secondary | ICD-10-CM | POA: Diagnosis not present

## 2022-04-10 DIAGNOSIS — K642 Third degree hemorrhoids: Secondary | ICD-10-CM | POA: Diagnosis not present

## 2022-04-10 DIAGNOSIS — Z1211 Encounter for screening for malignant neoplasm of colon: Secondary | ICD-10-CM | POA: Diagnosis not present

## 2022-04-13 ENCOUNTER — Other Ambulatory Visit (HOSPITAL_COMMUNITY): Payer: Self-pay

## 2022-04-14 ENCOUNTER — Ambulatory Visit (INDEPENDENT_AMBULATORY_CARE_PROVIDER_SITE_OTHER): Payer: 59

## 2022-04-14 ENCOUNTER — Ambulatory Visit (INDEPENDENT_AMBULATORY_CARE_PROVIDER_SITE_OTHER): Payer: 59 | Admitting: Primary Care

## 2022-04-14 ENCOUNTER — Telehealth: Payer: Self-pay | Admitting: Primary Care

## 2022-04-14 ENCOUNTER — Other Ambulatory Visit: Payer: Self-pay

## 2022-04-14 ENCOUNTER — Encounter: Payer: Self-pay | Admitting: Primary Care

## 2022-04-14 VITALS — BP 102/62 | HR 62 | Ht 70.0 in | Wt 227.0 lb

## 2022-04-14 DIAGNOSIS — R0602 Shortness of breath: Secondary | ICD-10-CM

## 2022-04-14 DIAGNOSIS — D8685 Sarcoid myocarditis: Secondary | ICD-10-CM

## 2022-04-14 DIAGNOSIS — R079 Chest pain, unspecified: Secondary | ICD-10-CM

## 2022-04-14 LAB — D-DIMER, QUANTITATIVE: D-Dimer, Quant: 1.26 mcg/mL FEU — ABNORMAL HIGH (ref <0.50)

## 2022-04-14 LAB — CBC WITH DIFFERENTIAL/PLATELET
Basophils Absolute: 0 10*3/uL (ref 0.0–0.1)
Basophils Relative: 0.8 % (ref 0.0–3.0)
Eosinophils Absolute: 0.3 10*3/uL (ref 0.0–0.7)
Eosinophils Relative: 5.8 % — ABNORMAL HIGH (ref 0.0–5.0)
HCT: 40.5 % (ref 39.0–52.0)
Hemoglobin: 13.4 g/dL (ref 13.0–17.0)
Lymphocytes Relative: 16.4 % (ref 12.0–46.0)
Lymphs Abs: 0.8 10*3/uL (ref 0.7–4.0)
MCHC: 33 g/dL (ref 30.0–36.0)
MCV: 89.2 fl (ref 78.0–100.0)
Monocytes Absolute: 0.9 10*3/uL (ref 0.1–1.0)
Monocytes Relative: 19.6 % — ABNORMAL HIGH (ref 3.0–12.0)
Neutro Abs: 2.7 10*3/uL (ref 1.4–7.7)
Neutrophils Relative %: 57.4 % (ref 43.0–77.0)
Platelets: 226 10*3/uL (ref 150.0–400.0)
RBC: 4.54 Mil/uL (ref 4.22–5.81)
RDW: 16.7 % — ABNORMAL HIGH (ref 11.5–15.5)
WBC: 4.8 10*3/uL (ref 4.0–10.5)

## 2022-04-14 LAB — COMPREHENSIVE METABOLIC PANEL
ALT: 14 U/L (ref 0–53)
AST: 15 U/L (ref 0–37)
Albumin: 4 g/dL (ref 3.5–5.2)
Alkaline Phosphatase: 74 U/L (ref 39–117)
BUN: 26 mg/dL — ABNORMAL HIGH (ref 6–23)
CO2: 24 mEq/L (ref 19–32)
Calcium: 9.5 mg/dL (ref 8.4–10.5)
Chloride: 105 mEq/L (ref 96–112)
Creatinine, Ser: 1.85 mg/dL — ABNORMAL HIGH (ref 0.40–1.50)
GFR: 40.66 mL/min — ABNORMAL LOW (ref >60.00)
Glucose, Bld: 88 mg/dL (ref 70–99)
Potassium: 4.2 mEq/L (ref 3.5–5.1)
Sodium: 138 mEq/L (ref 135–145)
Total Bilirubin: 0.4 mg/dL (ref 0.2–1.2)
Total Protein: 7.2 g/dL (ref 6.0–8.3)

## 2022-04-14 NOTE — Telephone Encounter (Signed)
Please notify patient D-dimer is elevated, needs VQ scan to rule out pulmonary embolism.  Checks x-ray showed chronic bronchitic changes, no acute process. Kidney function was impaired.  Does he follow-up with primary care regarding this?

## 2022-04-14 NOTE — Progress Notes (Signed)
Please let patient know CXR showed mild chronic bronchitis changes without acute abnormalities. Mediastinal contours normal. Lets wait for labs to come back before deciding if we need CT chest

## 2022-04-14 NOTE — Telephone Encounter (Signed)
Spoke with pt and reviewed lab results as dictated by [facility]. Pt stated understanding and is expecting call from [facility] to schedule VQ scan as ordered by [facility]. Nothing further needed at this time.

## 2022-04-14 NOTE — Patient Instructions (Addendum)
Recommendations: Continue methotrexate as directed   Orders: EKG re: chest pain/ cardiac sarcoid  CXR today Labs  Follow-up 3 months with Dr. Valeta Harms or Dr. Erin Fulling- 15 min ok

## 2022-04-14 NOTE — Progress Notes (Addendum)
D-dimer was elevated, needs VQ scan I will order. Can you please notify patient. I also sent CXR results that showed bronchitic changes. Kidney function is impaired. Does he follow with his PCP for this?

## 2022-04-14 NOTE — Progress Notes (Signed)
@Patient  ID: Nathan [name], male    DOB: April 14, 1965, 57 y.o.   MRN: [id number]  Chief Complaint  Patient presents with   Follow-up    Right rib pain( slight) SOB     Referring provider: Hayden Rasmussen, MD  HPI: 57 year old, former smoker. PMH significant for CHF, cardiac sarcoidosis, pleural effusion.   04/14/2022 Patient presents today for acute visit. He developed pain to his lateral right rib cage two weeks ago, pain has at times radiated to his right chest. He gets out of breath with any exertion and experiences dizziness if he bends over. Denies new or worsening cough. He does clear throat. He is taking methotrexate as directed every Wednesday. He follows with Dr. Haroldine Laws with cardiology, he has not been following with rheumatology d/t payment issue.    No Known Allergies  Immunization History  Administered Date(s) Administered   Influenza,inj,Quad PF,6+ Mos 10/15/2020   PFIZER(Purple Top)SARS-COV-2 Vaccination 07/06/2019, 07/27/2019, 02/15/2020   Pfizer Covid-19 Vaccine Bivalent Booster 68yrs & up 10/21/2020    Past Medical History:  Diagnosis Date   Allergies    09/16/2020   BPH (benign prostatic hyperplasia)    ED (erectile dysfunction)    GERD (gastroesophageal reflux disease)    Peripheral focal chorioretinal inflammation of both eyes    Retinal vasculitis of both eyes     Tobacco History: Social History   Tobacco Use  Smoking Status Former   Packs/day: .5   Types: Cigarettes   Quit date: 02/02/2010   Years since quitting: 12.2  Smokeless Tobacco Never   Counseling given: Not Answered   Outpatient Medications Prior to Visit  Medication Sig Dispense Refill   Blood Pressure Monitoring (OMRON 3 SERIES BP MONITOR) DEVI Use as directed 1 each 0   cetirizine (ZYRTEC) 10 MG tablet Take 10 mg by mouth as needed.     dapagliflozin propanediol (FARXIGA) 10 MG TABS tablet Take 1 tablet (10 mg total) by mouth daily. 90 tablet 3   furosemide (LASIX) 20 MG  tablet Take 1 tablet (20 mg total) by mouth daily as needed. 30 tablet 3   methotrexate (RHEUMATREX) 2.5 MG tablet Take 8 tablets (20 mg total) by mouth as directed every Wednesday. Caution: Chemotherapy, protect from light. 32 tablet 1   omeprazole (PRILOSEC) 40 MG capsule Take 1 capsule (40 mg total) by mouth every morning. 90 capsule 3   potassium chloride SA (KLOR-CON M) 20 MEQ tablet Take 1 tablet (20 mEq total) by mouth daily as needed. 30 tablet 3   sildenafil (REVATIO) 20 MG tablet Take 2-3 tablets (40-60 mg total) by mouth as needed. 50 tablet 0   spironolactone (ALDACTONE) 25 MG tablet Take 1 tablet (25 mg total) by mouth daily. 90 tablet 3   tamsulosin (FLOMAX) 0.4 MG CAPS capsule Take 1 capsule (0.4 mg total) by mouth at bedtime. 90 capsule 0   amiodarone (PACERONE) 200 MG tablet Take 1 tablet by mouth once  daily, Monday - Friday. 90 tablet 3   ibuprofen (ADVIL) 800 MG tablet Take 1 tablet (800 mg total) by mouth every 6 (six) to 8(eight) hours not to exceed 3200mg /day 30 tablet 0   losartan (COZAAR) 25 MG tablet Take 1 tablet (25 mg total) by mouth daily. 30 tablet 3   carvedilol (COREG) 3.125 MG tablet Take 1 tablet (3.125 mg total) by mouth 2 (two) times daily. 60 tablet 11   tamsulosin (FLOMAX) 0.4 MG CAPS capsule Take 1 capsule (0.4 mg total) by  mouth at bedtime. (Patient not taking: Reported on 04/14/2022) 90 capsule 0   No facility-administered medications prior to visit.   Review of Systems  Review of Systems  Constitutional: Negative.   HENT: Negative.    Respiratory:  Positive for shortness of breath.   Cardiovascular:  Positive for chest pain.  Gastrointestinal:  Negative for abdominal pain.  Neurological:  Positive for dizziness.   Physical Exam  BP 102/62 (BP Location: Left Arm, Patient Position: Sitting, Cuff Size: Normal)   Pulse 62   Ht 5\' 10"  (1.778 m)   Wt 227 lb (103 kg)   SpO2 99%   BMI 32.57 kg/m  Physical Exam Constitutional:      Appearance:  Normal appearance.  HENT:     Head: Normocephalic and atraumatic.     Right Ear: Tympanic membrane normal.     Left Ear: Tympanic membrane normal.     Mouth/Throat:     Mouth: Mucous membranes are moist.     Pharynx: Oropharynx is clear.  Cardiovascular:     Rate and Rhythm: Normal rate and regular rhythm.  Pulmonary:     Effort: Pulmonary effort is normal.     Breath sounds: Normal breath sounds. No wheezing, rhonchi or rales.  Skin:    General: Skin is warm and dry.  Neurological:     General: No focal deficit present.     Mental Status: He is alert and oriented to person, place, and time. Mental status is at baseline.  Psychiatric:        Mood and Affect: Mood normal.        Behavior: Behavior normal.        Thought Content: Thought content normal.        Judgment: Judgment normal.      Lab Results:  CBC    Component Value Date/Time   WBC 4.8 04/14/2022 0901   RBC 4.54 04/14/2022 0901   HGB 13.4 04/14/2022 0901   HCT 40.5 04/14/2022 0901   PLT 226.0 04/14/2022 0901   MCV 89.2 04/14/2022 0901   MCH 28.4 10/10/2021 1247   MCHC 33.0 04/14/2022 0901   RDW 16.7 (H) 04/14/2022 0901   LYMPHSABS 0.8 04/14/2022 0901   MONOABS 0.9 04/14/2022 0901   EOSABS 0.3 04/14/2022 0901   BASOSABS 0.0 04/14/2022 0901    BMET    Component Value Date/Time   NA 138 04/14/2022 0901   NA 141 03/10/2022 1039   K 4.2 04/14/2022 0901   CL 105 04/14/2022 0901   CO2 24 04/14/2022 0901   GLUCOSE 88 04/14/2022 0901   BUN 26 (H) 04/14/2022 0901   BUN 16 03/10/2022 1039   CREATININE 1.85 (H) 04/14/2022 0901   CALCIUM 9.5 04/14/2022 0901   GFRNONAA 43 (L) 01/22/2022 1359   GFRAA >60 12/05/2015 1215    BNP    Component Value Date/Time   BNP 109.8 (H) 01/22/2022 1400    ProBNP No results found for: "PROBNP"  Imaging: NM Pulmonary Perfusion  Result Date: 04/15/2022 CLINICAL DATA:  Elevated D-dimer, short of breath, dizziness, right upper chest pain EXAM: NUCLEAR MEDICINE  PERFUSION LUNG SCAN TECHNIQUE: Perfusion images were obtained in multiple projections after intravenous injection of radiopharmaceutical. Ventilation scans intentionally deferred if perfusion scan and chest x-ray adequate for interpretation during COVID 19 epidemic. RADIOPHARMACEUTICALS:  4.4 mCi Tc-66m MAA IV COMPARISON:  Chest x-ray 04/15/2022 FINDINGS: Planar images of the chest are obtained in multiple projections during the perfusion study. There are no perfusion defects. No evidence  of pulmonary embolus. IMPRESSION: 1. No evidence of pulmonary embolus. Electronically Signed   By: Randa Ngo M.D.   On: 04/15/2022 15:46   DG Chest 2 View  Result Date: 04/15/2022 CLINICAL DATA:  VQ Scan EXAM: CHEST - 2 VIEW COMPARISON:  Radiograph 04/14/2022 FINDINGS: Unchanged cardiomediastinal silhouette with single lead AICD. There is no focal airspace consolidation. There is no pleural effusion or evidence of pneumothorax. Mild thoracic spondylosis. No acute osseous abnormality. IMPRESSION: No evidence of acute cardiopulmonary disease. Electronically Signed   By: Maurine Simmering M.D.   On: 04/15/2022 10:31   DG Chest 2 View  Result Date: 04/14/2022 CLINICAL DATA:  Pleuritic right chest pain EXAM: CHEST - 2 VIEW COMPARISON:  10/29/2020 FINDINGS: LEFT subclavian ICD with lead projecting at RIGHT ventricle. Upper-normal size of cardiac silhouette. Mediastinal contours and pulmonary vascularity normal. Chronic peribronchial thickening without infiltrate, pleural effusion, or pneumothorax. No acute osseous findings. IMPRESSION: Mild chronic bronchitic changes without acute abnormalities. Electronically Signed   By: Lavonia Dana M.D.   On: 04/14/2022 09:24     Assessment & Plan:   Cardiac sarcoidosis Patient developed right sided chest pain two weeks ago with associated shortness of breath and dizziness. He is on methotrexate and reports compliance with medication. CXR showed mild bronchitic changes, no acute  cardiopulmonary process. ACE level was normal. D-dimer was elevated, checking VQ scan d/t kidney function. Advised he follow-up with cardiology and rheumatology first available.    Martyn Ehrich, NP 04/20/2022

## 2022-04-14 NOTE — Progress Notes (Addendum)
Advanced Heart Failure Clinic Note   PCP: Hayden Rasmussen, MD PCP-Cardiologist: Skeet Latch, MD  HF Cardiologist: Dr. Haroldine Laws  HPI: [name] is a 57 y.o. male with history of chronic systolic heart failure, mitral regurgitation, VT s/p ICD, cardiac sarcoidosis and peripheral focal chorioretinal inflammation of both eyes diagnosed in 2020 (followed at [facility] ophthalmology).     Admitted 10/12/20 with increased shortness of breath. CT chest with bulky mediastinal lymphadenopathy. Pulmonary consulted, underwent bronchoscopy + biopsy. Path showed no malignancy. Echo showed EF 25 % with reduced RV. Underwent R/LHC with normal cors and stable hemodynamics. cMRI that showed EF 18% with LGE pattern consistent with cardiac sarcoidosis. GMDT limited by hypotension. He was on 1/2 tab Bidil + [place]. Placed on prednisone, metrotrexate, Bactrim for PJP. Had NSVT and high PVC burden and started on mexilitene 200 mg bid. Discharged with Life Vest due to frequent PVCs.   Re-admitted 10/25/20 after LifeVest alarmed several times. Had frequent runs of NSVT and > 25% PVC burden. He was placed on IV amiodarone with some suppression of PVCs. Mexitil was discontinued and IV amiodarone started, transitioned eventually  to PO. EP consulted and d/t extensive transmural LGE and severely reduced LVEF, ICD placed for primary prevention. Had hypotension with amiodarone gtt and was started on midodrine. GDMT limited by hypotension. Discharge weight 211-215 lbs.  Saw Dr. Quentin Ore 12/22. Devices parameters ok. VT quiescent. Continued amio 200 daily.   Follow up 1/23, doing well, NYHA II symptoms and volume stable. Midodrine & PJP prophylaxis stopped and PET scan arranged.   Echo 02/07/21 EF 30-35% RV normal.   PET 9/23 1. There are few segments with hypermetabolic activity suggestive of an active inflammatory process in the LV. LVEF 26% 2. Approximately 5% of the left ventricle is involved with predominantly  mild/moderate hypermetabolic activity.  3. There is no evidence of abnormal metabolism in the right ventricle.  4.There are extensive areas of extracardiac hypermetabolic activity noted on the limited field of view, specifically "bulky"        bilateral hilar and mediastinal lymph nodes. If clinically indicated/concern for systemic disease, a whole body PET/CT        may be obtained for further evaluation.   Follow up 12/23, off pred and on MTX 20 mg per week. NYHA II and volume stable. Follow up with EP 2/24, amio decreased to M-F 200 mg.   Today he returns for HF follow up. Overall feeling fine. Main complaint is nausea, dizziness when he gets up at night to go to thre bathroom, no falls. Takes Flomax. Feels better off Entresto. Dizzy bending over, mild SOB walking on flat ground. Denies palpitations, CP,  edema, or PND/Orthopnea. Appetite ok. No fever or chills. He does not weigh at home. Taking all medications. Took a lasix a few weeks ago. Saw Pulm yesterday, D Dimer elevated and had V/Q scan today. CXR with chronic bronchitis.    Cardiac Studies: - Echo (1/23) EF 30-35%, RV normal.  - Echo (9/22): EF 25%, reduced RV.  - cMRI (9/22): EF 18% with LGE pattern consistent with cardiac sarcoidosis.  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 PAP 46/19 mm with a mean of 33 mmHg.   PCWP 27 mmHg and LVEDP 30 mmHg.  In the setting of systolic pressures 99991111 mmHg Cardiac Output (Fick) 6.75-3.06.-Index SVR 10.96, PVR 0.9 (Wood units)  Past Medical History:  Diagnosis Date   Allergies  09/16/2020   BPH (benign prostatic hyperplasia)    ED (erectile dysfunction)    GERD (gastroesophageal reflux disease)    Peripheral focal chorioretinal inflammation of both eyes    Retinal vasculitis of both eyes    Current Outpatient Medications  Medication Sig Dispense Refill   amiodarone (PACERONE) 200 MG tablet Take 200  mg by mouth daily.     Blood Pressure Monitoring (OMRON 3 SERIES BP MONITOR) DEVI Use as directed 1 each 0   carvedilol (COREG) 3.125 MG tablet Take 1 tablet (3.125 mg total) by mouth 2 (two) times daily. 60 tablet 11   cetirizine (ZYRTEC) 10 MG tablet Take 10 mg by mouth as needed.     dapagliflozin propanediol (FARXIGA) 10 MG TABS tablet Take 1 tablet (10 mg total) by mouth daily. 90 tablet 3   furosemide (LASIX) 20 MG tablet Take 1 tablet (20 mg total) by mouth daily as needed. 30 tablet 3   losartan (COZAAR) 25 MG tablet Take 1 tablet (25 mg total) by mouth daily. 30 tablet 3   methotrexate (RHEUMATREX) 2.5 MG tablet Take 8 tablets (20 mg total) by mouth as directed every Wednesday. Caution: Chemotherapy, protect from light. 32 tablet 1   omeprazole (PRILOSEC) 40 MG capsule Take 1 capsule (40 mg total) by mouth every morning. 90 capsule 3   potassium chloride SA (KLOR-CON M) 20 MEQ tablet Take 1 tablet (20 mEq total) by mouth daily as needed. 30 tablet 3   sildenafil (REVATIO) 20 MG tablet Take 2-3 tablets (40-60 mg total) by mouth as needed. 50 tablet 0   spironolactone (ALDACTONE) 25 MG tablet Take 1 tablet (25 mg total) by mouth daily. 90 tablet 3   tamsulosin (FLOMAX) 0.4 MG CAPS capsule Take 1 capsule (0.4 mg total) by mouth at bedtime. 90 capsule 0   No current facility-administered medications for this encounter.   No Known Allergies  Social History   Socioeconomic History   Marital status: Married    Spouse name: Not on file   Number of children: Not on file   Years of education: Not on file   Highest education level: Not on file  Occupational History   Not on file  Tobacco Use   Smoking status: Former    Packs/day: 0.50    Types: Cigarettes    Quit date: 02/02/2010    Years since quitting: 12.2   Smokeless tobacco: Never  Substance and Sexual Activity   Alcohol use: Yes   Drug use: Not Currently   Sexual activity: Not on file  Other Topics Concern   Not on file   Social History Narrative   Not on file   Social Determinants of Health   Financial Resource Strain: Low Risk  (10/21/2020)   Overall Financial Resource Strain (CARDIA)    Difficulty of Paying Living Expenses: Not very hard  Food Insecurity: No Food Insecurity (10/21/2020)   Hunger Vital Sign    Worried About Running Out of Food in the Last Year: Never true    Ran Out of Food in the Last Year: Never true  Transportation Needs: No Transportation Needs (10/21/2020)   PRAPARE - Hydrologist (Medical): No    Lack of Transportation (Non-Medical): No  Physical Activity: Not on file  Stress: Not on file  Social Connections: Not on file  Intimate Partner Violence: Not on file   Family History  Problem Relation Age of Onset   Cancer Father 41   Diabetes Father  BP 96/64   Pulse 70   Wt 103.3 kg (227 lb 12.8 oz)   SpO2 99%   BMI 32.69 kg/m   Wt Readings from Last 3 Encounters:  04/15/22 103.3 kg (227 lb 12.8 oz)  04/14/22 103 kg (227 lb)  03/10/22 107 kg (236 lb)   PHYSICAL EXAM: General:  NAD. No resp difficulty, walked into clinic, fatigued-appearing. HEENT: Normal Neck: Supple. No JVD. Carotids 2+ bilat; no bruits. No lymphadenopathy or thryomegaly appreciated. Cor: PMI nondisplaced. Regular rate & rhythm. No rubs, gallops or murmurs. Lungs: Clear Abdomen: Soft, nontender, nondistended. No hepatosplenomegaly. No bruits or masses. Good bowel sounds. Extremities: No cyanosis, clubbing, rash, edema Neuro: Alert & oriented x 3, cranial nerves grossly intact. Moves all 4 extremities w/o difficulty. Affect pleasant.  ASSESSMENT & PLAN: 1. Cardiac Sarcoid - cMRI 10/17/20 c/w cardiac sarcoid. - Off prednisone since 2/23 - Continue MTX 20 mg q Wednesdays - Continue folic acid, Vit D + calcium and PPI. - S/p ICD placement 09/26. - PET 9/23 LVEF 26% Mild FDG uptake in LV. Diffuse extracardiac FDG uptake suggestive of acitve systemic sarcoid - Continue  MTX - Dr. Haroldine Laws spoke with Pulmonary (Dr. Valeta Harms) and Rheum (Dr. Amil Amen) to discuss re starting steroids versus steroid-sparing agent.   - CRP 1.0, sed rate 2. He has been referred to Dr. Amil Amen but has an outstanding bill from several years ago. Spoke with HFSW, will refer to different Rheum practice. - Discussed with Dr. Haroldine Laws, with extracardiac FDG suggestive of active systemic sarcoid, needs to be on prednisone. Will discuss with Dr. Valeta Harms on dose.  2. NSVT/ Frequent PVCs  - Previously on mexilitene 200 mg bid. Now on amio 200  - S/p Biotronik ICD placement 10/28/20.  - PVCs appear suppressed.  - Decrease amiodarone 200 mg M-F per EP recs.  - Recent amio labs ok.   3. 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 - Echo 02/07/21 EF 30-35% RV normal. - PET scan 9/23  EF 26%  - NYHA II-early III. Volume status ok, GDMT limited by orthostasis. - Decrease losartan to 12.5 mg daily (off Entresto with low BP). - Continue carvedilol 3.125 mg bid. - Continue Farxiga 10 mg daily. - Continue sprionolactone 25 mg daily. - Continue Lasix 20 mg PRN - Labs yesterday stable.  4. CKD Stage IIIa - SCr baseline 1.4-1.8 - Labs yesterday showed SCr 1.85, K 4.2   5. Mediastinal Lymphadenopathy - 10/17/20 bronchoscopy + biopsy.  - Path showed no malignancy or evidence of sarcoid.  - PET 9/23 suggestive of diffuse active sarcoid - Dr. Haroldine Laws spoke with Pulmonary (Dr. Valeta Harms) and Rheum (Dr. Amil Amen) to discuss re starting steroids versus steroid-sparing agent. He has been referred to Rheum  Follow up in 3 months with Dr. Haroldine Laws.  Allena Katz, FNP-BC 04/15/22

## 2022-04-15 ENCOUNTER — Ambulatory Visit (HOSPITAL_COMMUNITY)
Admission: RE | Admit: 2022-04-15 | Discharge: 2022-04-15 | Disposition: A | Discharge status: Home / Self Care | Payer: 59 | Source: Ambulatory Visit | Attending: Primary Care | Admitting: Primary Care

## 2022-04-15 ENCOUNTER — Ambulatory Visit (HOSPITAL_COMMUNITY)
Admission: RE | Admit: 2022-04-15 | Discharge: 2022-04-15 | Disposition: A | Discharge status: Home / Self Care | Payer: 59 | Source: Ambulatory Visit | Attending: Family Medicine | Admitting: Family Medicine

## 2022-04-15 ENCOUNTER — Other Ambulatory Visit (HOSPITAL_COMMUNITY): Payer: Self-pay

## 2022-04-15 ENCOUNTER — Encounter (HOSPITAL_COMMUNITY)
Admission: RE | Admit: 2022-04-15 | Discharge: 2022-04-15 | Disposition: A | Discharge status: Home / Self Care | Payer: 59 | Source: Ambulatory Visit | Attending: Primary Care | Admitting: Primary Care

## 2022-04-15 ENCOUNTER — Encounter (HOSPITAL_COMMUNITY): Payer: Self-pay

## 2022-04-15 VITALS — BP 96/64 | HR 70 | Wt 227.8 lb

## 2022-04-15 DIAGNOSIS — N1831 Chronic kidney disease, stage 3a: Secondary | ICD-10-CM

## 2022-04-15 DIAGNOSIS — R0789 Other chest pain: Secondary | ICD-10-CM | POA: Insufficient documentation

## 2022-04-15 DIAGNOSIS — R0602 Shortness of breath: Secondary | ICD-10-CM | POA: Diagnosis not present

## 2022-04-15 DIAGNOSIS — R079 Chest pain, unspecified: Secondary | ICD-10-CM | POA: Diagnosis not present

## 2022-04-15 DIAGNOSIS — R42 Dizziness and giddiness: Secondary | ICD-10-CM | POA: Insufficient documentation

## 2022-04-15 DIAGNOSIS — R590 Localized enlarged lymph nodes: Secondary | ICD-10-CM | POA: Diagnosis not present

## 2022-04-15 DIAGNOSIS — I493 Ventricular premature depolarization: Secondary | ICD-10-CM

## 2022-04-15 DIAGNOSIS — D869 Sarcoidosis, unspecified: Secondary | ICD-10-CM

## 2022-04-15 DIAGNOSIS — R7989 Other specified abnormal findings of blood chemistry: Secondary | ICD-10-CM | POA: Diagnosis not present

## 2022-04-15 DIAGNOSIS — I5022 Chronic systolic (congestive) heart failure: Secondary | ICD-10-CM

## 2022-04-15 DIAGNOSIS — Z01818 Encounter for other preprocedural examination: Secondary | ICD-10-CM | POA: Diagnosis not present

## 2022-04-15 LAB — ANGIOTENSIN CONVERTING ENZYME: Angiotensin-Converting Enzyme: 20 U/L (ref 9–67)

## 2022-04-15 MED ORDER — AMIODARONE HCL 200 MG PO TABS
200.0000 mg | ORAL_TABLET | Freq: Every day | ORAL | 6 refills | Status: DC
  Filled 2022-04-15 – 2022-06-30 (×2): qty 30, 30d supply, fill #0
  Filled 2022-08-04: qty 30, 30d supply, fill #1
  Filled 2022-09-04: qty 30, 30d supply, fill #2
  Filled 2022-10-13: qty 30, 30d supply, fill #3
  Filled 2022-12-12: qty 30, 30d supply, fill #4
  Filled 2023-01-10: qty 30, 30d supply, fill #5

## 2022-04-15 MED ORDER — TECHNETIUM TO 99M ALBUMIN AGGREGATED
4.4000 | Freq: Once | INTRAVENOUS | Status: AC | PRN
  Administered 2022-04-15: 4.4 via INTRAVENOUS

## 2022-04-15 MED ORDER — LOSARTAN POTASSIUM 25 MG PO TABS
12.5000 mg | ORAL_TABLET | Freq: Every day | ORAL | 3 refills | Status: DC
  Filled 2022-04-15: qty 15, 30d supply, fill #0
  Filled 2022-06-03: qty 15, 30d supply, fill #1
  Filled 2022-07-15: qty 15, 30d supply, fill #2
  Filled 2022-08-18: qty 15, 30d supply, fill #3

## 2022-04-15 NOTE — Patient Instructions (Signed)
DECREASE Amiodarone to 200 mg Monday thru Friday only DECREASE Losartan to 12.5 mg (one half) tab daily  You have been referred to [facility] -they will be in contact with a referral  Your physician wants you to follow-up in: 3-4 months with Dr Haroldine Laws You will receive a reminder letter in the mail two months in advance. If you don't receive a letter, please call our office to schedule the follow-up appointment.   Do the following things EVERYDAY: Weigh yourself in the morning before breakfast. Write it down and keep it in a log. Take your medicines as prescribed Eat low salt foods--Limit salt (sodium) to 2000 mg per day.  Stay as active as you can everyday Limit all fluids for the day to less than 2 liters  At the [facility], you and your health needs are our priority. As part of our continuing mission to provide you with exceptional heart care, we have created designated Provider Care Teams. These Care Teams include your primary Cardiologist (physician) and Advanced Practice Providers (APPs- Physician Assistants and Nurse Practitioners) who all work together to provide you with the care you need, when you need it.   You may see any of the following providers on your designated Care Team at your next follow up: Dr Glori Bickers Dr Loralie Champagne Dr. Roxana Hires, NP Lyda Jester, [place] [facility] [place], [place] Forestine Na, NP Audry Riles, PharmD   Please be sure to bring in all your medications bottles to every appointment.    Thank you for choosing [facility]   If you have any questions or concerns before your next appointment please send [place] a message through [place] or call our office at [phone].    TO LEAVE A MESSAGE FOR THE NURSE SELECT OPTION 2, PLEASE LEAVE A MESSAGE INCLUDING: YOUR NAME DATE OF BIRTH CALL BACK NUMBER REASON FOR CALL**this is important as we  prioritize the call backs  YOU WILL RECEIVE A CALL BACK THE SAME DAY AS LONG AS YOU CALL BEFORE 4:00 PM

## 2022-04-15 NOTE — Progress Notes (Signed)
Please let patient know perfusion study was negative for pulmonary embolism. CXR without evidence of pneumonia. Ace level (sarcoid related) was normal. He should follow-up with cardiology regarding shortness of breath and chest pain d/t his hx cardiac sarcoid. Cont methotrexate.

## 2022-04-17 ENCOUNTER — Other Ambulatory Visit: Payer: Self-pay

## 2022-04-17 ENCOUNTER — Encounter (HOSPITAL_COMMUNITY): Payer: Self-pay

## 2022-04-17 ENCOUNTER — Emergency Department (HOSPITAL_COMMUNITY): Admission: EM | Admit: 2022-04-17 | Discharge: 2022-04-17 | Payer: Medicaid Other | Source: Home / Self Care

## 2022-04-17 ENCOUNTER — Emergency Department (HOSPITAL_COMMUNITY)
Admission: EM | Admit: 2022-04-17 | Discharge: 2022-04-18 | Disposition: A | Discharge status: Home / Self Care | Payer: Medicaid Other | Attending: Emergency Medicine | Admitting: Emergency Medicine

## 2022-04-17 ENCOUNTER — Emergency Department (HOSPITAL_COMMUNITY): Payer: Medicaid Other

## 2022-04-17 ENCOUNTER — Other Ambulatory Visit (HOSPITAL_COMMUNITY): Payer: Self-pay | Admitting: Family Medicine

## 2022-04-17 ENCOUNTER — Encounter (HOSPITAL_COMMUNITY): Payer: Self-pay | Admitting: Emergency Medicine

## 2022-04-17 ENCOUNTER — Other Ambulatory Visit (HOSPITAL_COMMUNITY): Payer: Self-pay

## 2022-04-17 DIAGNOSIS — R0602 Shortness of breath: Secondary | ICD-10-CM | POA: Diagnosis not present

## 2022-04-17 DIAGNOSIS — R059 Cough, unspecified: Secondary | ICD-10-CM | POA: Diagnosis not present

## 2022-04-17 DIAGNOSIS — I5022 Chronic systolic (congestive) heart failure: Secondary | ICD-10-CM | POA: Diagnosis not present

## 2022-04-17 DIAGNOSIS — R0789 Other chest pain: Secondary | ICD-10-CM | POA: Diagnosis not present

## 2022-04-17 DIAGNOSIS — J101 Influenza due to other identified influenza virus with other respiratory manifestations: Secondary | ICD-10-CM | POA: Diagnosis not present

## 2022-04-17 DIAGNOSIS — R42 Dizziness and giddiness: Secondary | ICD-10-CM | POA: Diagnosis not present

## 2022-04-17 DIAGNOSIS — D72819 Decreased white blood cell count, unspecified: Secondary | ICD-10-CM | POA: Diagnosis not present

## 2022-04-17 DIAGNOSIS — R509 Fever, unspecified: Secondary | ICD-10-CM | POA: Diagnosis present

## 2022-04-17 LAB — CBC WITH DIFFERENTIAL/PLATELET
Abs Immature Granulocytes: 0.01 10*3/uL (ref 0.00–0.07)
Basophils Absolute: 0 10*3/uL (ref 0.0–0.1)
Basophils Relative: 1 %
Eosinophils Absolute: 0 10*3/uL (ref 0.0–0.5)
Eosinophils Relative: 0 %
HCT: 46.8 % (ref 39.0–52.0)
Hemoglobin: 14 g/dL (ref 13.0–17.0)
Immature Granulocytes: 0 %
Lymphocytes Relative: 9 %
Lymphs Abs: 0.3 10*3/uL — ABNORMAL LOW (ref 0.7–4.0)
MCH: 24.1 pg — ABNORMAL LOW (ref 26.0–34.0)
MCHC: 29.9 g/dL — ABNORMAL LOW (ref 30.0–36.0)
MCV: 80.6 fL (ref 80.0–100.0)
Monocytes Absolute: 0.8 10*3/uL (ref 0.1–1.0)
Monocytes Relative: 22 %
Neutro Abs: 2.4 10*3/uL (ref 1.7–7.7)
Neutrophils Relative %: 68 %
Platelets: 175 10*3/uL (ref 150–400)
RBC: 5.81 MIL/uL (ref 4.22–5.81)
RDW: 14.6 % (ref 11.5–15.5)
WBC: 3.6 10*3/uL — ABNORMAL LOW (ref 4.0–10.5)
nRBC: 0 % (ref 0.0–0.2)

## 2022-04-17 LAB — BASIC METABOLIC PANEL
Anion gap: 10 (ref 5–15)
BUN: 9 mg/dL (ref 6–20)
CO2: 25 mmol/L (ref 22–32)
Calcium: 9.1 mg/dL (ref 8.9–10.3)
Chloride: 100 mmol/L (ref 98–111)
Creatinine, Ser: 1.1 mg/dL (ref 0.61–1.24)
GFR, Estimated: >60 mL/min (ref >60)
Glucose, Bld: 162 mg/dL — ABNORMAL HIGH (ref 70–99)
Potassium: 3.9 mmol/L (ref 3.5–5.1)
Sodium: 135 mmol/L (ref 135–145)

## 2022-04-17 LAB — RESP PANEL BY RT-PCR (RSV, FLU A&B, COVID)  RVPGX2
Influenza A by PCR: POSITIVE — AB
Influenza B by PCR: NEGATIVE
Resp Syncytial Virus by PCR: NEGATIVE
SARS Coronavirus 2 by RT PCR: NEGATIVE

## 2022-04-17 LAB — LACTIC ACID, PLASMA: Lactic Acid, Venous: 1.5 mmol/L (ref 0.5–1.9)

## 2022-04-17 MED ORDER — PREDNISONE 5 MG PO TABS
20.0000 mg | ORAL_TABLET | Freq: Every day | ORAL | 3 refills | Status: DC
  Filled 2022-04-17: qty 120, 30d supply, fill #0
  Filled 2022-05-22: qty 120, 30d supply, fill #1
  Filled 2022-06-15: qty 120, 30d supply, fill #2
  Filled 2022-07-29: qty 120, 30d supply, fill #3
  Filled 2022-09-04: qty 120, 30d supply, fill #4

## 2022-04-17 MED ORDER — IPRATROPIUM-ALBUTEROL 0.5-2.5 (3) MG/3ML IN SOLN
3.0000 mL | Freq: Once | RESPIRATORY_TRACT | Status: AC
  Administered 2022-04-17: 3 mL via RESPIRATORY_TRACT
  Filled 2022-04-17: qty 3

## 2022-04-17 MED ORDER — OSELTAMIVIR PHOSPHATE 75 MG PO CAPS
75.0000 mg | ORAL_CAPSULE | Freq: Once | ORAL | Status: AC
  Administered 2022-04-18: 75 mg via ORAL
  Filled 2022-04-17: qty 1

## 2022-04-17 MED ORDER — ACETAMINOPHEN 500 MG PO TABS
1000.0000 mg | ORAL_TABLET | Freq: Once | ORAL | Status: AC
  Administered 2022-04-17: 1000 mg via ORAL
  Filled 2022-04-17: qty 2

## 2022-04-17 MED ORDER — LACTATED RINGERS IV BOLUS
1000.0000 mL | Freq: Once | INTRAVENOUS | Status: AC
  Administered 2022-04-17: 1000 mL via INTRAVENOUS

## 2022-04-17 NOTE — Addendum Note (Signed)
Encounter addended by: Rafael Bihari, FNP on: 04/17/2022 4:02 PM  Actions taken: Clinical Note Signed

## 2022-04-17 NOTE — Progress Notes (Signed)
Discussed PET findings with Drs Bensimhon & Icard. Plan to restart prednisone 40 mg daily and arrange appt with Dr. Loanne Drilling for follow up of sarcoidosis.   Rx sent to pharmacy and I called Nathan [name] and discussed plan. He is agreeable.  Allena Katz, FNP-BC

## 2022-04-17 NOTE — ED Triage Notes (Signed)
Pt c/o generalized body aches, non productive cough, headache, and fatigue since last night. Endorses subjective fevers at home and chills. Denies any known recent sick contacts.       Previously triaged and chart correction was updated.   CXR/ respiratory panel completed in triage that resulted FLU A.    1000mg  Tylenol administered in triage 1939.

## 2022-04-17 NOTE — ED Triage Notes (Signed)
Pt c/o generalized body aches, non productive cough, headache, and fatigue since last night. Endorses subjective fevers at home and chills. Denies any known recent sick contacts.

## 2022-04-17 NOTE — ED Notes (Signed)
Dispo to merge with correct chart.

## 2022-04-17 NOTE — ED Provider Triage Note (Cosign Needed Addendum)
Emergency Medicine Provider Triage Evaluation Note  David Gibson, Yan. , a 57 y.o. male  was evaluated in triage.  Pt complains of cough, congestion, headaches, body aches.  Symptoms began yesterday afternoon.  Has been taking DayQuil and NyQuil with no relief.  Cough is nonproductive.  Denies shortness of breath or chest pain.  Review of Systems  Positive: As above Negative: As above  Physical Exam  BP (!) 142/93 (BP Location: Right Arm)   Pulse 78   Temp (!) 103.1 F (39.5 C) (Oral)   Resp 20   Ht  (1.727 m)   Wt 95.3 kg   SpO2 97%   BMI 31.93 kg/m  Gen:   Awake, no distress   Resp:  Normal effort  MSK:   Moves extremities without difficulty  Other:    Medical Decision Making  Medically screening exam initiated at 7:25 PM.  Appropriate orders placed.  [employer]. was informed that the remainder of the evaluation will be completed by another provider, this initial triage assessment does not replace that evaluation, and the importance of remaining in the ED until their evaluation is complete.  Tylenol given in triage.  Respiratory panel and x-ray ordered   Michelle Piper, PA-C 04/17/22 1926    Michelle Piper, PA-C 05/26/22 2359

## 2022-04-17 NOTE — ED Provider Notes (Signed)
[facility] Emergency Department Provider Note MRN:  [id number]  Arrival date & time: 04/18/22     Chief Complaint   Fever   History of Present Illness   David Gibson. is a 57 y.o. year-old male presents to the ED with chief complaint of body aches. Onset last night.  Associated cough and fever.  Denies sick contacts.  No treatments PTA.  History provided by patient.   Review of Systems  Pertinent positive and negative review of systems noted in HPI.    Physical Exam   Vitals:   04/17/22 2118 04/17/22 2252  BP: (!) 142/93   Pulse: 78   Resp: 20   Temp: (!) 103.1 F (39.5 C) 99.9 F (37.7 C)  SpO2: 97%     CONSTITUTIONAL:  non toxic-appearing, NAD NEURO:  Alert and oriented x 3, CN 3-12 grossly intact EYES:  eyes equal and reactive ENT/NECK:  Supple, no stridor  CARDIO:  normal rate, regular rhythm, appears well-perfused  PULM:  No respiratory distress, wheezes present GI/GU:  non-distended,  MSK/SPINE:  No gross deformities, no edema, moves all extremities  SKIN:  no rash, atraumatic   *Additional and/or pertinent findings included in MDM below  Diagnostic and Interventional Summary    EKG Interpretation  Date/Time:    Ventricular Rate:    PR Interval:    QRS Duration:   QT Interval:    QTC Calculation:   R Axis:     Text Interpretation:         Labs Reviewed  CBC WITH DIFFERENTIAL/PLATELET - Abnormal; Notable for the following components:      Result Value   WBC 3.6 (*)    MCH 24.1 (*)    MCHC 29.9 (*)    Lymphs Abs 0.3 (*)    All other components within normal limits  BASIC METABOLIC PANEL - Abnormal; Notable for the following components:   Glucose, Bld 162 (*)    All other components within normal limits  LACTIC ACID, PLASMA    No orders to display    Medications  oseltamivir (TAMIFLU) capsule 75 mg (has no administration in time range)  albuterol (VENTOLIN HFA) 108 (90 Base) MCG/ACT inhaler 2 puff  (has no administration in time range)  ipratropium-albuterol (DUONEB) 0.5-2.5 (3) MG/3ML nebulizer solution 3 mL (3 mLs Nebulization Given 04/17/22 2341)  lactated ringers bolus 1,000 mL (0 mLs Intravenous Stopped 04/18/22 0017)     Procedures  /  Critical Care Procedures  ED Course and Medical Decision Making  I have reviewed the triage vital signs, the nursing notes, and pertinent available records from the EMR.  Social Determinants Affecting Complexity of Care: Patient has no clinically significant social determinants affecting this chief complaint..   ED Course:    Medical Decision Making Patient here with cough, fever, and body aches.  Flu A positive in triage.  Quite wheezy.  Will give neb.  Will give some fluid.  Ambulate with O2 monitor.  If doesn't drop sats, can go home.    Amount and/or Complexity of Data Reviewed Labs: ordered.    Details: Lactic normal Mild leukopenia to 3.6 Normal creatinine Radiology: independent interpretation performed.    Details: Focal opacity seen, discussed with Dr. Vanita Panda, thought to be viral with onset of symptoms only yesterday. Will cover with Tamiflu.    Risk Prescription drug management. Decision regarding hospitalization.     Consultants: No consultations were needed in caring for this patient.   Treatment and Plan:  I considered admission due to patient's initial presentation, but after considering the examination and diagnostic results, patient will not require admission and can be discharged with outpatient follow-up.    Final Clinical Impressions(s) / ED Diagnoses     ICD-10-CM   1. Influenza A  J10.1       ED Discharge Orders          Ordered    oseltamivir (TAMIFLU) 75 MG capsule  Every 12 hours        04/18/22 0027    benzonatate (TESSALON) 100 MG capsule  2 times daily PRN        04/18/22 0027              Discharge Instructions Discussed with and Provided to Patient:   Discharge Instructions    None      Montine Circle, PA-C 04/18/22 Terrilee Croak, MD 04/19/22 2303

## 2022-04-18 ENCOUNTER — Other Ambulatory Visit: Payer: Self-pay

## 2022-04-18 MED ORDER — OSELTAMIVIR PHOSPHATE 75 MG PO CAPS: 75.0000 mg | ORAL_CAPSULE | Freq: Two times a day (BID) | ORAL | 0 refills | Status: AC

## 2022-04-18 MED ORDER — OSELTAMIVIR PHOSPHATE 75 MG PO CAPS
75.0000 mg | ORAL_CAPSULE | Freq: Two times a day (BID) | ORAL | 0 refills | Status: DC
  Filled 2022-04-18: qty 10, 5d supply, fill #0

## 2022-04-18 MED ORDER — BENZONATATE 100 MG PO CAPS: 100.0000 mg | ORAL_CAPSULE | Freq: Two times a day (BID) | ORAL | 0 refills | Status: AC | PRN

## 2022-04-18 MED ORDER — BENZONATATE 100 MG PO CAPS
100.0000 mg | ORAL_CAPSULE | Freq: Two times a day (BID) | ORAL | 0 refills | Status: DC | PRN
  Filled 2022-04-18: qty 20, 10d supply, fill #0

## 2022-04-18 MED ORDER — ALBUTEROL SULFATE HFA 108 (90 BASE) MCG/ACT IN AERS
2.0000 | INHALATION_SPRAY | RESPIRATORY_TRACT | Status: DC | PRN
  Administered 2022-04-18: 2 via RESPIRATORY_TRACT
  Filled 2022-04-18: qty 6.7

## 2022-04-18 NOTE — ED Notes (Signed)
Pt ambulated on pulse ox. SPO2 98% while ambulating. Pt has no complaints at this time.

## 2022-04-18 NOTE — ED Notes (Signed)
Pt was able to ambulate well with appropriate O2

## 2022-04-20 ENCOUNTER — Emergency Department (HOSPITAL_COMMUNITY)
Admission: EM | Admit: 2022-04-20 | Discharge: 2022-04-20 | Disposition: A | Discharge status: Home / Self Care | Payer: Medicaid Other | Attending: Emergency Medicine | Admitting: Emergency Medicine

## 2022-04-20 ENCOUNTER — Other Ambulatory Visit (HOSPITAL_COMMUNITY): Payer: Self-pay

## 2022-04-20 ENCOUNTER — Telehealth (HOSPITAL_COMMUNITY): Payer: Self-pay | Admitting: Family Medicine

## 2022-04-20 ENCOUNTER — Other Ambulatory Visit (HOSPITAL_COMMUNITY): Payer: Self-pay | Admitting: Family Medicine

## 2022-04-20 DIAGNOSIS — R000 Tachycardia, unspecified: Secondary | ICD-10-CM | POA: Diagnosis not present

## 2022-04-20 DIAGNOSIS — J101 Influenza due to other identified influenza virus with other respiratory manifestations: Secondary | ICD-10-CM | POA: Diagnosis not present

## 2022-04-20 DIAGNOSIS — R630 Anorexia: Secondary | ICD-10-CM | POA: Diagnosis present

## 2022-04-20 MED ORDER — SULFAMETHOXAZOLE-TRIMETHOPRIM 800-160 MG PO TABS
1.0000 | ORAL_TABLET | ORAL | 3 refills | Status: DC
  Filled 2022-04-20: qty 12, 28d supply, fill #0
  Filled 2022-05-22: qty 12, 28d supply, fill #1
  Filled 2022-06-15: qty 12, 28d supply, fill #2
  Filled 2022-07-15: qty 12, 28d supply, fill #3
  Filled 2022-08-18: qty 12, 28d supply, fill #4

## 2022-04-20 NOTE — Assessment & Plan Note (Addendum)
Patient developed right sided chest pain two weeks ago with associated shortness of breath and dizziness. He is on methotrexate and reports compliance with medication. CXR showed mild bronchitic changes, no acute cardiopulmonary process. ACE level was normal. D-dimer was elevated, checking VQ scan d/t kidney function. Advised he follow-up with cardiology and rheumatology first available.

## 2022-04-20 NOTE — Telephone Encounter (Signed)
Called to notify patient to restart PJP prophylaxis. He will take Bactrim every MWF while he is on > 15 mg prednisone daily. Rx sent to pharmacy. He is agreeable with plan.  Allena Katz, FNP-BC 04/20/22

## 2022-04-20 NOTE — ED Provider Notes (Signed)
[facility] Provider Note   CSN: [id number] Arrival date & time: 04/20/22  1840     History  No chief complaint on file.   David Cleopher Arliss Outhouse. is a 57 y.o. male presented for a work note extension after being seen on 04/18/2022 and diagnosed with influenza A.  Patient states he has had no new symptoms and has been taking the Tamiflu and ibuprofen as prescribed.  Patient states he still taking fluids orally but still has decreased appetite.  Patient states he needs a work extension as he is supposed go to work today but feels he cannot.  Patient denied fevers, chills, abdominal pain, vision changes, neck stiffness, nausea/vomiting, dysuria  Home Medications Prior to Admission medications   Medication Sig Start Date End Date Taking? Authorizing Provider  benzonatate (TESSALON) 100 MG capsule Take 1 capsule (100 mg total) by mouth 2 (two) times daily as needed for cough. 04/18/22   Montine Circle, PA-C  HYDROcodone-acetaminophen (NORCO/VICODIN) 5-325 MG tablet Take 1 tablet by mouth every 6 (six) hours as needed. 08/24/21   Drenda Freeze, MD  lidocaine (LIDODERM) 5 % Place 1 patch onto the skin every 12 (twelve) hours. Remove & Discard patch within 12 hours or as directed by MD 07/06/19   Rosemarie Ax, MD  naproxen (NAPROSYN) 500 MG tablet Take 1 tablet (500 mg total) by mouth 2 (two) times daily. 01/14/21   Horton, Barbette Hair, MD  oseltamivir (TAMIFLU) 75 MG capsule Take 1 capsule (75 mg total) by mouth every 12 (twelve) hours. 04/18/22   Montine Circle, PA-C  oxyCODONE-acetaminophen (PERCOCET) 5-325 MG tablet Take 1-2 tablets by mouth every 4 (four) hours as needed for severe pain. 07/20/19   Rosemarie Ax, MD  penicillin v potassium (VEETID) 500 MG tablet Take 1 tablet (500 mg total) by mouth 4 (four) times daily. 01/14/21   Horton, Barbette Hair, MD  amiodarone (PACERONE) 200 MG tablet Take 1 tablet (200 mg total) by mouth daily.  11/25/20 06/04/21  Bensimhon, Shaune Pascal, MD      Allergies    Patient has no known allergies.    Review of Systems   Review of Systems See HPI Physical Exam Updated Vital Signs BP 129/88 (BP Location: Right Arm)   Pulse (!) 106   Temp 99.9 F (37.7 C)   Resp 18   SpO2 93%  Physical Exam HENT:     Right Ear: Tympanic membrane, ear canal and external ear normal.     Left Ear: Tympanic membrane, ear canal and external ear normal.     Nose: Congestion and rhinorrhea present.     Mouth/Throat:     Mouth: Mucous membranes are moist.     Pharynx: Oropharynx is clear.     Comments: No posterior oropharynx abnormalities noted No PTA noted Eyes:     Extraocular Movements: Extraocular movements intact.     Conjunctiva/sclera: Conjunctivae normal.     Pupils: Pupils are equal, round, and reactive to light.  Neck:     Comments: No meningeal symptoms noted Cardiovascular:     Rate and Rhythm: Regular rhythm. Tachycardia present.     Pulses: Normal pulses.     Heart sounds: Normal heart sounds.  Pulmonary:     Effort: Pulmonary effort is normal. No respiratory distress.     Breath sounds: Normal breath sounds.  Abdominal:     General: Abdomen is flat.     Palpations: Abdomen is soft.  Tenderness: There is no abdominal tenderness. There is no guarding.  Musculoskeletal:        General: Normal range of motion.     Cervical back: Normal range of motion and neck supple. No rigidity or tenderness.  Skin:    General: Skin is warm.     Capillary Refill: Capillary refill takes less than 2 seconds.  Neurological:     General: No focal deficit present.     Mental Status: He is alert and oriented to person, place, and time.  Psychiatric:        Mood and Affect: Mood normal.     ED Results / Procedures / Treatments   Labs (all labs ordered are listed, but only abnormal results are displayed) Labs Reviewed - No data to display  EKG None  Radiology No results  found.  Procedures Procedures    Medications Ordered in ED Medications - No data to display  ED Course/ Medical Decision Making/ A&P                             Medical Decision Making  [facility] Savarino. 57 y.o. presented today for URI like symptoms. Working DDx that I considered at this time includes, but not limited to, influenza A, peritonsillar abscess, retropharyngeal abscess, pneumonia, meningitis.  R/o DDx: PTA: Not noted on exam RPA: No neck stiffness or dysphagia or posterior oropharynx abnormalities Pneumonia: More likely this is still influenza A due to duration of symptoms Meningitis: No meningeal symptoms noted  Review of prior external notes: 04/17/2022 ED  Unique Tests and My Interpretation: None  Discussion with Independent Historian: None  Discussion of Management of Tests: None  Risk: Low:  - based on diagnostic testing/clinical impression and treatment plan  Risk Stratification Score: None  Plan: Patient presented for influenza A and work extension.  Patient was seen few days ago for the same symptoms and did not endorse any new symptoms.  Patient has been taking his Tamiflu and Tessalon as prescribed along with ibuprofen and Tylenol as needed every 6 hours.  Patient had unremarkable physical exam and wanted his work note to be extended until his work note to be extended.  Since patient did not endorse any new symptoms and had stable vitals with reassuring physical exam labs and I will be ordered at this time and has appropriate flu medication, patient will be discharged from triage.  I spoke with the patient at length about symptomatic treatment and to follow-up with her primary care provider in the next few days to be reevaluated.  Patient was discharged with a temperature of 99.9 F and a pulse rate of 106 however this is consistent with his previous diagnosis of influenza A and does not necessitate a larger workup at this time as patient is  overall stable for discharge.  Patient was given return precautions.patient stable for discharge at this time.  Patient verbalized understanding of plan.         Final Clinical Impression(s) / ED Diagnoses Final diagnoses:  Influenza A    Rx / DC Orders ED Discharge Orders     None         Elvina Sidle 04/20/22 Doran Heater    Godfrey Pick, MD 04/21/22 [phone]

## 2022-04-20 NOTE — ED Triage Notes (Signed)
Patient here requesting his work excuse be extended to seven days to adhere to a policy at his employer to avoid a penalty.

## 2022-04-20 NOTE — Discharge Instructions (Addendum)
Please continue to take your Tessalon and Tamiflu as prescribed.  You may alternate every 6 hours as needed for pain between 1000 mg Tylenol and 40 mg ibuprofen.  Please take in plenty of fluids and eat small amounts of food as tolerated.  I have attached a work note for you as well.  Please follow-up with her primary care provider in the next 2 days to be reevaluated.  If symptoms worsen please return to ER.

## 2022-04-21 ENCOUNTER — Telehealth (HOSPITAL_BASED_OUTPATIENT_CLINIC_OR_DEPARTMENT_OTHER): Payer: Self-pay

## 2022-04-21 NOTE — Telephone Encounter (Signed)
Spk with pt appt scheduled for 04/04 at 0830

## 2022-04-21 NOTE — Telephone Encounter (Signed)
-----   Message from Garner Nash, DO sent at 04/17/2022  4:21 PM EDT ----- Regarding: Appt with Dr. Naida Sleight,   Thanks for reaching out.   Opal Sidles has been managing these sarcoid folks a lot recently after bx. Feel free to start him on 20mg  of prednisone and I will get an appt for him to follow up with Dr. Loanne Drilling ASAP.   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  However PET at [facility] shows mild cardiac sarcoid activity (5%) but diffuse pulmonary involvement.   Looks like he needs to be back on prednisone. Do you want to see him or just want [place] to start pred back?   Thanks -dan

## 2022-04-28 ENCOUNTER — Ambulatory Visit (INDEPENDENT_AMBULATORY_CARE_PROVIDER_SITE_OTHER): Payer: 59

## 2022-04-28 DIAGNOSIS — I4729 Other ventricular tachycardia: Secondary | ICD-10-CM | POA: Diagnosis not present

## 2022-04-28 LAB — CUP PACEART REMOTE DEVICE CHECK
Date Time Interrogation Session: 20240326105932
Implantable Lead Connection Status: 753985
Implantable Lead Implant Date: 20220926
Implantable Lead Location: 753860
Implantable Lead Model: 436910
Implantable Lead Serial Number: 81470915
Implantable Pulse Generator Implant Date: 20220926
Pulse Gen Model: 429525
Pulse Gen Serial Number: 84847461

## 2022-05-07 ENCOUNTER — Ambulatory Visit (INDEPENDENT_AMBULATORY_CARE_PROVIDER_SITE_OTHER): Payer: 59 | Admitting: Pulmonary Disease

## 2022-05-07 ENCOUNTER — Encounter (HOSPITAL_BASED_OUTPATIENT_CLINIC_OR_DEPARTMENT_OTHER): Payer: Self-pay | Admitting: Pulmonary Disease

## 2022-05-07 VITALS — BP 114/62 | HR 75 | Ht 70.0 in | Wt 230.2 lb

## 2022-05-07 DIAGNOSIS — D869 Sarcoidosis, unspecified: Secondary | ICD-10-CM | POA: Diagnosis not present

## 2022-05-07 DIAGNOSIS — N1832 Chronic kidney disease, stage 3b: Secondary | ICD-10-CM

## 2022-05-07 NOTE — Progress Notes (Signed)
Subjective:   PATIENT ID: Nathan [name] GENDER: male DOB: 01-Nov-1965, MRN: [id number]  Chief Complaint  Patient presents with   New Patient (Initial Visit)    doe    Reason for Visit: New consult  [name] is a 57 year old male former smoker with sarcoid with cardiac/pulmonary/ocular involvement, onchronic immunosuppressants, VT s/p ICD, chronic systolic heart failure, HTN and DM2 who presents as a new pulmonary patient.   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 He was then admitted in 10/2020 for new heart failure and cMRI consistent with cardiac sarcoid. Bronchoscopy for mediastinal adenopathy was not diagnostic of sarcoid but was negative for malignancy. He was started on prednisone and methotrexate. Prednisone was discontinued in 03/2021 and he continued methotrexate 20 mg weekly. PET/CT sarcoid protocol 10/27/21 at [facility] demonstrated 5% cardiac involvement as well as diffuse pulmonary involvement. He was restarted on prednisone until he could be scheduled for pulmonary consultation.  He reports his symptoms are well controlled on prednisone and methotrexate. Though after restarting prednisone he is unsure if it has provided benefit and only taking it due to the abnormal PET/CT. Denies shortness of breath, cough, wheezing, chest pain, fatigue.  Social History: Quit smoking 2000s. Social smoker. Quit in 2012  I have personally reviewed patient's past medical/family/social history, allergies, current medications.  Past Medical History:  Diagnosis Date   Allergies    09/16/2020   BPH (benign prostatic hyperplasia)    ED (erectile dysfunction)    GERD (gastroesophageal reflux disease)    Peripheral focal chorioretinal inflammation of both eyes    Retinal vasculitis of both eyes      Family History  Problem Relation Age of Onset    Cancer Father 47   Diabetes Father      Social History   Occupational History   Not on file  Tobacco Use   Smoking status: Former    Packs/day: .5    Types: Cigarettes    Quit date: 02/02/2010    Years since quitting: 12.2   Smokeless tobacco: Never  Substance and Sexual Activity   Alcohol use: Yes   Drug use: Not Currently   Sexual activity: Not on file    No Known Allergies   Outpatient Medications Prior to Visit  Medication Sig Dispense Refill   amiodarone (PACERONE) 200 MG tablet Take 1 tablet (200 mg total) by mouth daily at 12 noon. Monday-Friday only 30 tablet 6   Blood Pressure Monitoring (OMRON 3 SERIES BP MONITOR) DEVI Use as directed 1 each 0   carvedilol (COREG) 3.125 MG tablet Take 1 tablet (3.125 mg total) by mouth 2 (two) times daily. 60 tablet 11   cetirizine (ZYRTEC) 10 MG tablet Take 10 mg by mouth as needed.     dapagliflozin propanediol (FARXIGA) 10 MG TABS tablet Take 1 tablet (10 mg total) by mouth daily. 90 tablet 3   folic acid (FOLVITE) 1 MG tablet Take by mouth.     furosemide (LASIX) 20 MG tablet Take 1 tablet (20 mg total) by mouth daily as needed. 30 tablet 3   losartan (COZAAR) 25 MG tablet Take 1/2 tablet (12.5 mg total) by mouth daily. 15 tablet 3   methotrexate (RHEUMATREX) 2.5 MG tablet Take 8 tablets (20 mg total) by mouth as directed every Wednesday. Caution: Chemotherapy, protect from light. 32 tablet 1   omeprazole (PRILOSEC)  40 MG capsule Take 1 capsule (40 mg total) by mouth every morning. 90 capsule 3   potassium chloride SA (KLOR-CON M) 20 MEQ tablet Take 1 tablet (20 mEq total) by mouth daily as needed. 30 tablet 3   predniSONE (DELTASONE) 5 MG tablet Take 4 tablets (20 mg total) by mouth daily with breakfast. 240 tablet 3   sildenafil (REVATIO) 20 MG tablet Take 2-3 tablets (40-60 mg total) by mouth as needed. 50 tablet 0   spironolactone (ALDACTONE) 25 MG tablet Take 1 tablet (25 mg total) by mouth daily. 90 tablet 3    sulfamethoxazole-trimethoprim (BACTRIM DS) 800-160 MG tablet Take 1 tablet by mouth 3 (three) times a week. Take 1 tablet every Monday, Wednesday and Friday while taking > 15 mg prednisone daily 24 tablet 3   tamsulosin (FLOMAX) 0.4 MG CAPS capsule Take 1 capsule (0.4 mg total) by mouth at bedtime. 90 capsule 0   No facility-administered medications prior to visit.    Review of Systems  Constitutional:  Negative for chills, diaphoresis, fever, malaise/fatigue and weight loss.  HENT:  Negative for congestion.   Respiratory:  Negative for cough, hemoptysis, sputum production, shortness of breath and wheezing.   Cardiovascular:  Negative for chest pain, palpitations and leg swelling.     Objective:   Vitals:   05/07/22 0841  BP: 114/62  Pulse: 75  SpO2: 97%  Weight: 230 lb 3.2 oz (104.4 kg)  Height: 5\' 10"  (1.778 m)   SpO2: 97 % O2 Device: None (Room air)  Physical Exam: General: Well-appearing, no acute distress HENT: [place], AT Eyes: EOMI, no scleral icterus Respiratory: Clear to auscultation bilaterally.  No crackles, wheezing or rales Cardiovascular: RRR, -M/R/G, no JVD Extremities:-Edema,-tenderness Neuro: AAO x4, CNII-XII grossly intact Psych: Normal mood, normal affect  Data Reviewed:  Imaging:  Cardiac PET/CT 10/27/21 metabolic study with F-18 FDG and scanned on PET [facility]:   1. There are few segments with hypermetabolic activity suggestive of an active inflammatory process in the left ventricular       myocardium.   2. Approximately 5% of the left ventricle is involved with predominantly mild/moderate hypermetabolic activity.   3. There is no evidence of abnormal metabolism in the right ventricle.   4. In the appropriate clinical context with positive biomarkers, these findings may represent active cardiac sarcoidosis.    Perfusion/Function.    Gated cardiac PET/CT rest myocardial perfusion study with [id number] demonstrates:   1. Abnormal myocardial perfusion in a  non-CAD pattern.   2. Severe dilated left ventricle with severe left ventricular systolic dysfunction.    Non-diagnostic limited  CT obtained for PET attenuation correction:   1. There are no discernible coronary artery calcifications.   2. There are extensive areas of extracardiac hypermetabolic activity noted on the limited field of view, specifically "bulky"        bilateral hilar and mediastinal lymph nodes. If clinically indicated/concern for systemic disease, a whole body PET/CT   PFT: None of file  Labs:    Latest Ref Rng & Units 04/14/2022    9:01 AM 10/10/2021   12:47 PM 06/04/2021    3:04 PM  CBC  WBC 4.0 - 10.5 K/uL 4.8  3.4  3.4   Hemoglobin 13.0 - 17.0 g/dL 21.3  08.6  57.8   Hematocrit 39.0 - 52.0 % 40.5  39.6  40.7   Platelets 150.0 - 400.0 K/uL 226.0  269  263       Latest Ref Rng & Units  04/14/2022    9:01 AM 03/10/2022   10:39 AM 01/22/2022    1:59 PM  BMP  Glucose 70 - 99 mg/dL 88  73  91   BUN 6 - 23 mg/dL Creatinine 0.40 - 1.50 mg/dL 1.61  0.96  0.45   BUN/Creat Ratio 9 - 20  10    Sodium 135 - 145 mEq/L 138  141  138   Potassium 3.5 - 5.1 mEq/L 4.2  4.8  5.4   Chloride 96 - 112 mEq/L 105  107  107   CO2 19 - 32 mEq/L Calcium 8.4 - 10.5 mg/dL 9.5  9.0  9.1       Latest Ref Rng & Units 04/14/2022    9:01 AM 03/10/2022   10:39 AM 01/22/2022    1:59 PM  Hepatic Function  Total Protein 6.0 - 8.3 g/dL 7.2  6.4  6.4   Albumin 3.5 - 5.2 g/dL 4.0  4.1  3.6   AST 0 - 37 U/L ALT 0 - 53 U/L Alk Phosphatase 39 - 117 U/L 74  74  59   Total Bilirubin 0.2 - 1.2 mg/dL 0.4  0.3  0.7    Bronchoscopy 10/18/20 [facility] - no malignancy     Assessment & Plan:   Discussion:  57 year old male former smoker with sarcoid with cardiac/pulmonary/ocular involvement, onchronic immunosuppressants, VT s/p ICD, chronic systolic heart failure, HTN and DM2 who presents as a new pulmonary patient.   We discussed the clinical  course of sarcoid and management including serial PFTs, labs, eye exam, and EKG and chest imaging if indicated. Currently on prednisone and methotrexate. Will add infliximab with plan to titrate off steroids  Reviewed adverse effects of steroids discussed including bruising, weight gain, fluid retention, Cushingoid features, hypertension, cardiac arrhythmias, osteoporosis, gastric ulcers, increased risk of infection, insomnia and hyperglycemia.  Reviewed risks and benefits of methotrexate and infliximab therapy. Possible adverse effects including but not limited to increased risk of infection, liver toxicity, bone marrow suppression, pneumonitis and oral ulcers.  Pulmonary sarcoidosis with cardiac and pulmonary, hx ocular --Dx in 10/2020 via cardiac MRI --PET/CT 10/2021 - Active cardiac sarcoid with pulmonary involvement  Cardiac sarcoid with NSVT Chronic systolic heart failure EF 30-35% Mitral regurgitation --Followed by Cardiology. Referred 05/2022 for multi-organ involvement --MR Cardiac 10/17/20 - Severe LV dysfunction with LGE pattern, bilateral pleural effusions --S/p ICD 10/28/20 --Echo 02/07/21 EF 30-35% --Amio and HF management per Cardiology  Peripheral focal chorioretinal inflammation of both eyes --Diagnosed in 2020 at [facility] --Last seen 02/07/19 with Dr. Sherryll Burger  History of immunosuppression High risk medication management --Methotrexate 02/2018-2021 for ocular sarcoid --Methotrexate 10/2020>01/2022 --Will plan to start infliximab if you are a candidate. Labs ordered --Continue methotrexate 20 mg weekly  --Continue prednisone 20 mg daily 04/28/22> --On PJP ppx MWF --Recommend PPI daily when taking steroids --Labs ordered: As noted below  Sarcoid Monitoring --Recent chest imaging reviewed.  --Annual PFTs. ORDER pulmonary function test --Annual ophthalmology exam.  Last visit on 2023 per patient. Per EMR 02/07/19 with Dr. Sherryll Burger and has previously been lost to  follow-up --Recent EKG and TTE reviewed as above --Routine labs as needed: CBC with diff, CMET, 1, 25 and 25 hydroxy vitamin D, urinary calcium  CKD IIIB --Refer to Nephrology to establish care  Health Maintenance Immunization  History  Administered Date(s) Administered   Influenza,inj,Quad PF,6+ Mos 10/15/2020   PFIZER(Purple Top)SARS-COV-2 Vaccination 07/06/2019, 07/27/2019, 02/15/2020   Pfizer Covid-19 Vaccine Bivalent Booster 812yrs & up 10/21/2020   CT Lung Screen - not qualified. Insufficient tobacco history  Orders Placed This Encounter  Procedures   Hepatitis B core antibody, IgM    Standing Status:   Future    Number of Occurrences:   1    Standing Expiration Date:   05/07/2023   Hepatitis B surface antigen    Standing Status:   Future    Number of Occurrences:   1    Standing Expiration Date:   05/07/2023   Hepatitis C antibody    Standing Status:   Future    Number of Occurrences:   1    Standing Expiration Date:   05/07/2023   QuantiFERON-TB Gold Plus    Standing Status:   Future    Number of Occurrences:   1    Standing Expiration Date:   05/07/2023   Ambulatory referral to Nephrology    Referral Priority:   Routine    Referral Type:   Consultation    Referral Reason:   Specialty Services Required    Requested Specialty:   Nephrology    Number of Visits Requested:   1   Pulmonary function test    Standing Status:   Future    Standing Expiration Date:   05/07/2023    Order Specific Question:   Where should this test be performed?    Answer:   [facility]  No orders of the defined types were placed in this encounter.   Return for  , after PFT, with Dr. Everardo AllEllison when next available.  I have spent a total time of 60-minutes on the day of the appointment reviewing prior documentation, coordinating care and discussing medical diagnosis and plan with the patient/family. Imaging, labs and tests included in this note have been reviewed and interpreted independently by  me.  Endya Austin Mechele CollinJane Elenora Hawbaker, MD [facility] 05/07/2022 8:48 AM  Office Number [phone]

## 2022-05-07 NOTE — Patient Instructions (Addendum)
Sarcoidosis --Will plan to start infliximab if you are a candidate. Labs ordered --Continue methotrexate 20 mg weekly  --Continue prednisone 20 mg daily 04/28/22> --Recommend PPI daily when taking steroids  Sarcoid Monitoring --Recent chest imaging reviewed.  --Annual PFTs. ORDER pulmonary function test  Please check out to schedule your PFTs Please go to lab for blood draw

## 2022-05-12 ENCOUNTER — Other Ambulatory Visit (HOSPITAL_COMMUNITY): Payer: Self-pay | Admitting: Internal Medicine

## 2022-05-12 ENCOUNTER — Ambulatory Visit (INDEPENDENT_AMBULATORY_CARE_PROVIDER_SITE_OTHER): Payer: 59 | Admitting: Pulmonary Disease

## 2022-05-12 ENCOUNTER — Other Ambulatory Visit (HOSPITAL_COMMUNITY): Payer: Self-pay

## 2022-05-12 ENCOUNTER — Encounter (HOSPITAL_BASED_OUTPATIENT_CLINIC_OR_DEPARTMENT_OTHER): Payer: Self-pay | Admitting: Pulmonary Disease

## 2022-05-12 ENCOUNTER — Other Ambulatory Visit (HOSPITAL_COMMUNITY): Payer: Self-pay | Admitting: Family Medicine

## 2022-05-12 DIAGNOSIS — D869 Sarcoidosis, unspecified: Secondary | ICD-10-CM | POA: Diagnosis not present

## 2022-05-12 LAB — PULMONARY FUNCTION TEST
DL/VA % pred: 94 %
DL/VA: 4.08 ml/min/mmHg/L
DLCO cor % pred: 80 %
DLCO cor: 22.88 ml/min/mmHg
DLCO unc % pred: 77 %
DLCO unc: 22.07 ml/min/mmHg
FEF 25-75 Post: 5.04 L/sec
FEF 25-75 Pre: 4.88 L/sec
FEF2575-%Change-Post: 3 %
FEF2575-%Pred-Post: 156 %
FEF2575-%Pred-Pre: 151 %
FEV1-%Change-Post: -2 %
FEV1-%Pred-Post: 92 %
FEV1-%Pred-Pre: 95 %
FEV1-Post: 3.5 L
FEV1-Pre: 3.59 L
FEV1FVC-%Change-Post: -4 %
FEV1FVC-%Pred-Pre: 116 %
FEV6-%Change-Post: 1 %
FEV6-%Pred-Post: 86 %
FEV6-%Pred-Pre: 84 %
FEV6-Post: 4.09 L
FEV6-Pre: 4.01 L
FEV6FVC-%Change-Post: 0 %
FEV6FVC-%Pred-Post: 104 %
FEV6FVC-%Pred-Pre: 104 %
FVC-%Change-Post: 1 %
FVC-%Pred-Post: 83 %
FVC-%Pred-Pre: 81 %
FVC-Post: 4.09 L
FVC-Pre: 4.02 L
Post FEV1/FVC ratio: 86 %
Post FEV6/FVC ratio: 100 %
Pre FEV1/FVC ratio: 89 %
Pre FEV6/FVC Ratio: 100 %
RV % pred: 76 %
RV: 1.65 L
TLC % pred: 87 %
TLC: 6.12 L

## 2022-05-12 MED ORDER — METHOTREXATE SODIUM 2.5 MG PO TABS
20.0000 mg | ORAL_TABLET | ORAL | 1 refills | Status: DC
  Filled 2022-05-12: qty 32, 28d supply, fill #0
  Filled 2022-06-03: qty 32, 28d supply, fill #1

## 2022-05-12 MED ORDER — CARVEDILOL 3.125 MG PO TABS
3.1250 mg | ORAL_TABLET | Freq: Two times a day (BID) | ORAL | 11 refills | Status: DC
  Filled 2022-05-12: qty 60, 30d supply, fill #0
  Filled 2022-06-30: qty 60, 30d supply, fill #1
  Filled 2022-08-04: qty 60, 30d supply, fill #2
  Filled 2022-09-04: qty 60, 30d supply, fill #3
  Filled 2022-10-13: qty 60, 30d supply, fill #4
  Filled 2022-12-12: qty 60, 30d supply, fill #5
  Filled 2023-03-05 – 2023-03-17 (×2): qty 60, 30d supply, fill #6
  Filled 2023-04-23: qty 60, 30d supply, fill #7

## 2022-05-12 NOTE — Patient Instructions (Signed)
Full PFT Performed Today  

## 2022-05-12 NOTE — Progress Notes (Signed)
Full PFT Performed Today  

## 2022-05-13 ENCOUNTER — Telehealth: Payer: Self-pay | Admitting: Pharmacist

## 2022-05-13 ENCOUNTER — Other Ambulatory Visit (HOSPITAL_COMMUNITY): Payer: Self-pay

## 2022-05-13 DIAGNOSIS — Z111 Encounter for screening for respiratory tuberculosis: Secondary | ICD-10-CM

## 2022-05-13 DIAGNOSIS — Z79899 Other long term (current) drug therapy: Secondary | ICD-10-CM

## 2022-05-13 NOTE — Telephone Encounter (Addendum)
Baseline labs are still pending.  Dose for infliximab: 3mg /kg at Week 0, 2, and 6, then 3mg /kg every 8 weeks thereafter  Premedications: diphenhydramine 25mg , acetaminophen 650mg  given 30-45 minutes before infusion  He continues on MTX 20mg  once weekly with folic acid daily.He is already on PJP ppx with Bactrim DS on M, W, F. He is also on prednisone 20mg  daily with breakfast.  Nathan [name], PharmD, MPH, BCPS, CPP Clinical Pharmacist (Rheumatology and Pulmonology)  ----- Message from Chi Mechele Collin, MD sent at 05/12/2022  2:07 AM EDT ----- Please start infliximab for cardiac and pulmonary sarcoid once labs are available. Let me know if you have any issues seeing labs from [facility]. We have been having issues receiving results recently.

## 2022-05-13 NOTE — Progress Notes (Addendum)
Once labs result, will place order for infliximab infusions at [facility]  Chesley Mires, PharmD, MPH, BCPS, CPP Clinical Pharmacist (Rheumatology and Pulmonology)

## 2022-05-15 ENCOUNTER — Telehealth (HOSPITAL_BASED_OUTPATIENT_CLINIC_OR_DEPARTMENT_OTHER): Payer: Self-pay | Admitting: Pulmonary Disease

## 2022-05-15 LAB — QUANTIFERON-TB GOLD PLUS
QuantiFERON Mitogen Value: 0.2 IU/mL
QuantiFERON Nil Value: 0.06 IU/mL
QuantiFERON TB1 Ag Value: 0.11 IU/mL
QuantiFERON TB2 Ag Value: 0.11 IU/mL
QuantiFERON-TB Gold Plus: UNDETERMINED — AB

## 2022-05-15 LAB — HEPATITIS C ANTIBODY: Hep C Virus Ab: NONREACTIVE

## 2022-05-15 LAB — HEPATITIS B CORE ANTIBODY, IGM: Hep B C IgM: NEGATIVE

## 2022-05-15 LAB — HEPATITIS B SURFACE ANTIGEN: Hepatitis B Surface Ag: NEGATIVE

## 2022-05-15 NOTE — Telephone Encounter (Signed)
Indeterminate TB gold. Will need TB gold repeated prior to placing infliximab referral. IF TB gold indeterminate again, he will need TB PPD skin test completed w PCP or health deparmtent  Chesley Mires, PharmD, MPH, BCPS, CPP Clinical Pharmacist (Rheumatology and Pulmonology)

## 2022-05-15 NOTE — Telephone Encounter (Signed)
Please contact patient regarding recent labs on 05/07/22. Hepatitis antibodies are negative however his Quantiferon TB is indeterminate.  Please re-order quantiferon TB and arrange for patient to have lab drawn

## 2022-05-19 ENCOUNTER — Other Ambulatory Visit: Payer: Self-pay | Admitting: Pulmonary Disease

## 2022-05-19 DIAGNOSIS — Z111 Encounter for screening for respiratory tuberculosis: Secondary | ICD-10-CM | POA: Diagnosis not present

## 2022-05-19 NOTE — Telephone Encounter (Signed)
MyChart message sent to pt via TB gold result. Future order placed for TB gold  Chesley Mires, PharmD, MPH, BCPS, CPP Clinical Pharmacist (Rheumatology and Pulmonology)

## 2022-05-20 ENCOUNTER — Encounter: Payer: Self-pay | Admitting: *Deleted

## 2022-05-20 NOTE — Telephone Encounter (Signed)
Called and sp[oke with patient. Patient stated he came in yesterday and had redone the quantiferon TB lab.   Nothing further needed.

## 2022-05-21 ENCOUNTER — Encounter (HOSPITAL_BASED_OUTPATIENT_CLINIC_OR_DEPARTMENT_OTHER): Payer: Self-pay | Admitting: Pulmonary Disease

## 2022-05-21 ENCOUNTER — Ambulatory Visit (INDEPENDENT_AMBULATORY_CARE_PROVIDER_SITE_OTHER): Payer: 59 | Admitting: Pulmonary Disease

## 2022-05-21 VITALS — BP 110/62 | HR 83 | Ht 70.0 in | Wt 224.0 lb

## 2022-05-21 DIAGNOSIS — N1832 Chronic kidney disease, stage 3b: Secondary | ICD-10-CM | POA: Diagnosis not present

## 2022-05-21 DIAGNOSIS — D869 Sarcoidosis, unspecified: Secondary | ICD-10-CM

## 2022-05-21 NOTE — Patient Instructions (Addendum)
Sarcoidosis --Annual PFTs. Normal on 05/21/22 --Awaiting quantiferon  Peripheral focal chorioretinal inflammation of both eyes --Referred to new Ophthalmologist in the [facility] area  History of immunosuppression High risk medication management --Continue methotrexate 20 mg weekly  --Continue prednisone 20 mg daily 04/28/22> --On PJP ppx MWF --Recommend PPI daily when taking steroids  CKD IIIB --Refer to Nephrology to establish care

## 2022-05-21 NOTE — Progress Notes (Signed)
Subjective:   PATIENT ID: Nathan [name] GENDER: male DOB: 13-Nov-1965, MRN: [id number]  Chief Complaint  Patient presents with   Follow-up    Pft results and wants to know if he qualifies for infusions     Reason for Visit: Follow-up  [name] is a 57 year old male former smoker with sarcoid with cardiac/pulmonary/ocular involvement, onchronic immunosuppressants, VT s/p ICD, chronic systolic heart failure, HTN and DM2 who presents as a new pulmonary patient.   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 He was then admitted in 10/2020 for new heart failure and cMRI consistent with cardiac sarcoid. Bronchoscopy for mediastinal adenopathy was not diagnostic of sarcoid but was negative for malignancy. He was started on prednisone and methotrexate. Prednisone was discontinued in 03/2021 and he continued methotrexate 20 mg weekly. PET/CT sarcoid protocol 10/27/21 at [facility] demonstrated 5% cardiac involvement as well as diffuse pulmonary involvement. He was restarted on prednisone until he could be scheduled for pulmonary consultation.  He reports his symptoms are well controlled on prednisone and methotrexate. Though after restarting prednisone he is unsure if it has provided benefit and only taking it due to the abnormal PET/CT. Denies shortness of breath, cough, wheezing, chest pain, fatigue.  05/21/22 Since our last visit continues to have visual blurriness. Has not been contacted by Nephrology. Prefers to go to local Ophthalmologist and requesting referral. Compliant with prednisone and methotrexate. Denies any respiratory complaints  Social History: Quit smoking 2000s. Social smoker. Quit in 2012  Past Medical History:  Diagnosis Date   Allergies    09/16/2020   BPH (benign prostatic hyperplasia)    ED (erectile dysfunction)    GERD  (gastroesophageal reflux disease)    Peripheral focal chorioretinal inflammation of both eyes    Retinal vasculitis of both eyes      Family History  Problem Relation Age of Onset   Cancer Father 33   Diabetes Father      Social History   Occupational History   Not on file  Tobacco Use   Smoking status: Former    Packs/day: .5    Types: Cigarettes    Quit date: 02/02/2010    Years since quitting: 12.3   Smokeless tobacco: Never  Substance and Sexual Activity   Alcohol use: Yes   Drug use: Not Currently   Sexual activity: Not on file    No Known Allergies   Outpatient Medications Prior to Visit  Medication Sig Dispense Refill   amiodarone (PACERONE) 200 MG tablet Take 1 tablet (200 mg total) by mouth daily at 12 noon. Monday-Friday only 30 tablet 6   Blood Pressure Monitoring (OMRON 3 SERIES BP MONITOR) DEVI Use as directed 1 each 0   carvedilol (COREG) 3.125 MG tablet Take 1 tablet (3.125 mg total) by mouth 2 (two) times daily. 60 tablet 11   cetirizine (ZYRTEC) 10 MG tablet Take 10 mg by mouth as needed.     dapagliflozin propanediol (FARXIGA) 10 MG TABS tablet Take 1 tablet (10 mg total) by mouth daily. 90 tablet 3   folic acid (FOLVITE) 1 MG tablet Take by mouth.     furosemide (LASIX) 20 MG tablet Take 1 tablet (20 mg total) by mouth daily as needed. 30 tablet 3   losartan (COZAAR) 25 MG tablet Take 1/2 tablet (12.5 mg total) by mouth daily. 15 tablet 3  methotrexate (RHEUMATREX) 2.5 MG tablet Take 8 tablets (20 mg total) by mouth as directed every Wednesday. Caution: Chemotherapy, protect from light. 32 tablet 1   omeprazole (PRILOSEC) 40 MG capsule Take 1 capsule (40 mg total) by mouth every morning. 90 capsule 3   potassium chloride SA (KLOR-CON M) 20 MEQ tablet Take 1 tablet (20 mEq total) by mouth daily as needed. 30 tablet 3   predniSONE (DELTASONE) 5 MG tablet Take 4 tablets (20 mg total) by mouth daily with breakfast. 240 tablet 3   sildenafil (REVATIO) 20 MG  tablet Take 2-3 tablets (40-60 mg total) by mouth as needed. 50 tablet 0   spironolactone (ALDACTONE) 25 MG tablet Take 1 tablet (25 mg total) by mouth daily. 90 tablet 3   sulfamethoxazole-trimethoprim (BACTRIM DS) 800-160 MG tablet Take 1 tablet by mouth 3 (three) times a week. Take 1 tablet every Monday, Wednesday and Friday while taking > 15 mg prednisone daily 24 tablet 3   tamsulosin (FLOMAX) 0.4 MG CAPS capsule Take 1 capsule (0.4 mg total) by mouth at bedtime. 90 capsule 0   No facility-administered medications prior to visit.    Review of Systems  Constitutional:  Negative for chills, diaphoresis, fever, malaise/fatigue and weight loss.  HENT:  Negative for congestion.   Respiratory:  Negative for cough, hemoptysis, sputum production, shortness of breath and wheezing.   Cardiovascular:  Negative for chest pain, palpitations and leg swelling.     Objective:   Vitals:   05/21/22 0938  BP: 110/62  Pulse: 83  SpO2: 99%  Weight: 224 lb (101.6 kg)  Height: 5\' 10"  (1.778 m)     Physical Exam: General: Well-appearing, no acute distress HENT: [place], AT Eyes: EOMI, no scleral icterus Respiratory: Clear to auscultation bilaterally.  No crackles, wheezing or rales Cardiovascular: RRR, -M/R/G, no JVD Extremities:-Edema,-tenderness Neuro: AAO x4, CNII-XII grossly intact Psych: Normal mood, normal affect  Data Reviewed:  Imaging:  Cardiac PET/CT 10/27/21 metabolic study with F-18 FDG and scanned on PET [facility] MI:   1. There are few segments with hypermetabolic activity suggestive of an active inflammatory process in the left ventricular       myocardium.   2. Approximately 5% of the left ventricle is involved with predominantly mild/moderate hypermetabolic activity.   3. There is no evidence of abnormal metabolism in the right ventricle.   4. In the appropriate clinical context with positive biomarkers, these findings may represent active cardiac sarcoidosis.     Perfusion/Function.    Gated cardiac PET/CT rest myocardial perfusion study with Rb-82 demonstrates:   1. Abnormal myocardial perfusion in a non-CAD pattern.   2. Severe dilated left ventricle with severe left ventricular systolic dysfunction.    Non-diagnostic limited  CT obtained for PET attenuation correction:   1. There are no discernible coronary artery calcifications.   2. There are extensive areas of extracardiac hypermetabolic activity noted on the limited field of view, specifically "bulky"        bilateral hilar and mediastinal lymph nodes. If clinically indicated/concern for systemic disease, a whole body PET/CT   PFT: 05/12/22 FVC 4.09 (83%) FEV1 3.50 (92%) Ratio 86  TLC 87% DLCO 77% Interpretation: Normal PFTs  Labs:    Latest Ref Rng & Units 04/14/2022    9:01 AM 10/10/2021   12:47 PM 06/04/2021    3:04 PM  CBC  WBC 4.0 - 10.5 K/uL 4.8  3.4  3.4   Hemoglobin 13.0 - 17.0 g/dL 16.1  09.6  13.0  Hematocrit 39.0 - 52.0 % 40.5  39.6  40.7   Platelets 150.0 - 400.0 K/uL 226.0  269  263       Latest Ref Rng & Units 04/14/2022    9:01 AM 03/10/2022   10:39 AM 01/22/2022    1:59 PM  BMP  Glucose 70 - 99 mg/dL 88  73  91   BUN 6 - 23 mg/dL Creatinine 0.40 - 1.50 mg/dL 1.61  0.96  0.45   BUN/Creat Ratio 9 - 20  10    Sodium 135 - 145 mEq/L 138  141  138   Potassium 3.5 - 5.1 mEq/L 4.2  4.8  5.4   Chloride 96 - 112 mEq/L 105  107  107   CO2 19 - 32 mEq/L Calcium 8.4 - 10.5 mg/dL 9.5  9.0  9.1       Latest Ref Rng & Units 04/14/2022    9:01 AM 03/10/2022   10:39 AM 01/22/2022    1:59 PM  Hepatic Function  Total Protein 6.0 - 8.3 g/dL 7.2  6.4  6.4   Albumin 3.5 - 5.2 g/dL 4.0  4.1  3.6   AST 0 - 37 U/L ALT 0 - 53 U/L Alk Phosphatase 39 - 117 U/L 74  74  59   Total Bilirubin 0.2 - 1.2 mg/dL 0.4  0.3  0.7    Bronchoscopy 10/18/20 [facility] - no malignancy     Assessment & Plan:   Discussion:  57 year old male  former smoker with sarcoid with cardiac/pulmonary/ocular involvement, onchronic immunosuppressants, VT s/p ICD, chronic systolic heart failure, HTN and DM2 who presents for follow-up.  We discussed the clinical course of sarcoid and management including serial PFTs, labs, eye exam, and EKG and chest imaging if indicated. If symptoms suggest sarcoid flare in the future, we would manage with steroids +/- biologics.  Adverse effects of steroids discussed including bruising, weight gain, fluid retention, Cushingoid features, hypertension, cardiac arrhythmias, osteoporosis, gastric ulcers, increased risk of infection, insomnia and hyperglycemia.  Reviewed risks and benefits of methotrexate and infliximab therapy. Possible adverse effects including but not limited to increased risk of infection, liver toxicity, bone marrow suppression, pneumonitis and oral ulcers.  Reviewed PFTs. Reviewed labs. Plan to titrate steroids slowly once infliximab is initiated  Pulmonary sarcoidosis with cardiac and pulmonary, hx ocular --Dx in 10/2020 via cardiac MRI --PET/CT 10/2021 - Active cardiac sarcoid with pulmonary involvement  Cardiac sarcoid with NSVT Chronic systolic heart failure EF 30-35% Mitral regurgitation --Followed by Cardiology. Referred 05/2022 for multi-organ involvement --MR Cardiac 10/17/20 - Severe LV dysfunction with LGE pattern, bilateral pleural effusions --S/p ICD 10/28/20 --Echo 02/07/21 EF 30-35% --Amio and HF management per Cardiology  Peripheral focal chorioretinal inflammation of both eyes --Diagnosed in 2020 at [facility] --Last seen 02/07/19 with Dr. Sherryll Burger --Referred to new Ophthalmologist in the [facility] area  History of immunosuppression High risk medication management --Methotrexate 02/2018-2021 for ocular sarcoid --Methotrexate 10/2020>01/2022 --Will plan to start infliximab if you are a candidate. Quantiferon pending --Continue methotrexate 20 mg weekly  --Continue  prednisone 20 mg daily 04/28/22> --On PJP ppx MWF --Recommend PPI daily when taking steroids --Labs ordered: As noted below  Sarcoid Monitoring --Recent chest imaging reviewed.  --Annual PFTs. Normal on 05/21/22. --Annual ophthalmology exam.  Last visit on 2023 per patient.  Per EMR 02/07/19 with Dr. Sherryll Burger and has previously been lost to follow-up.  --Recent EKG and TTE reviewed as above --Routine labs as needed: CBC with diff, CMET, 1, 25 and 25 hydroxy vitamin D, urinary calcium  CKD IIIB --Refer to Nephrology to establish care  Health Maintenance Immunization History  Administered Date(s) Administered   Influenza,inj,Quad PF,6+ Mos 10/15/2020   PFIZER(Purple Top)SARS-COV-2 Vaccination 07/06/2019, 07/27/2019, 02/15/2020   Pfizer Covid-19 Vaccine Bivalent Booster 75yrs & up 10/21/2020   CT Lung Screen - not qualified. Insufficient tobacco history  Orders Placed This Encounter  Procedures   Ambulatory referral to Nephrology    Referral Priority:   Routine    Referral Type:   Consultation    Referral Reason:   Specialty Services Required    Requested Specialty:   Nephrology    Number of Visits Requested:   1   Ambulatory referral to Ophthalmology    Referral Priority:   Urgent    Referral Type:   Consultation    Referral Reason:   Specialty Services Required    Requested Specialty:   Ophthalmology    Number of Visits Requested:   1  No orders of the defined types were placed in this encounter.   Return in about 3 months (around 08/20/2022).  I have spent a total time of 35-minutes on the day of the appointment including chart review, data review, collecting history, coordinating care and discussing medical diagnosis and plan with the patient/family. Past medical history, allergies, medications were reviewed. Pertinent imaging, labs and tests included in this note have been reviewed and interpreted independently by me.  Geovana Gebel Mechele Collin, MD [facility] 05/21/2022 9:34 AM  Office Number [phone]

## 2022-05-22 ENCOUNTER — Encounter (HOSPITAL_BASED_OUTPATIENT_CLINIC_OR_DEPARTMENT_OTHER): Payer: Self-pay | Admitting: Pulmonary Disease

## 2022-05-22 LAB — QUANTIFERON-TB GOLD PLUS
QuantiFERON Mitogen Value: 1.06 IU/mL
QuantiFERON Nil Value: 0.01 IU/mL
QuantiFERON TB1 Ag Value: 0.02 IU/mL
QuantiFERON TB2 Ag Value: 0.2 IU/mL
QuantiFERON-TB Gold Plus: NEGATIVE

## 2022-05-25 ENCOUNTER — Encounter: Payer: Self-pay | Admitting: Pulmonary Disease

## 2022-05-25 DIAGNOSIS — Z79899 Other long term (current) drug therapy: Secondary | ICD-10-CM | POA: Insufficient documentation

## 2022-05-25 NOTE — Telephone Encounter (Signed)
Repeat TB gold negative. Can proceed with infliximab infusions. Referral placed to [employer] for [employer] (biosimilar) infusions.  Dose for infliximab: /kg at Week 0, 2, and 6, then /kg every 8 weeks thereafter   Premedications: diphenhydramine , acetaminophen  given 30-45 minutes before infusion, Solu-Medrol 40 mg  Chesley Mires, PharmD, MPH, BCPS, CPP Clinical Pharmacist (Rheumatology and Pulmonology)

## 2022-05-27 ENCOUNTER — Telehealth: Payer: Self-pay | Admitting: Pharmacy Technician

## 2022-05-27 NOTE — Telephone Encounter (Addendum)
Kyung Bacca note:  Auth Submission: APPROVED Site of care: Site of care: [facility] Payer: [employer] Medication & CPT/J Code(s) submitted: Avsola (infliximab-axxq) [id number] Route of submission (phone, fax, portal):  Phone # Fax # Auth type: Buy/Bill Units/visits requested: 7 DOSES Reference number: [id number] Approval from: 05/26/22 to 05/25/23   AVSOLA co-pay card: approved Id: [id number] Bin: [id number] Pcn: [id number] Gr: [id number] EXP: 12/02/25

## 2022-06-03 ENCOUNTER — Emergency Department (HOSPITAL_COMMUNITY)
Admission: EM | Admit: 2022-06-03 | Discharge: 2022-06-03 | Disposition: A | Discharge status: Home / Self Care | Payer: 59 | Attending: Emergency Medicine | Admitting: Emergency Medicine

## 2022-06-03 ENCOUNTER — Other Ambulatory Visit (HOSPITAL_BASED_OUTPATIENT_CLINIC_OR_DEPARTMENT_OTHER): Payer: Self-pay

## 2022-06-03 ENCOUNTER — Emergency Department (HOSPITAL_COMMUNITY): Payer: 59

## 2022-06-03 ENCOUNTER — Other Ambulatory Visit: Payer: Self-pay

## 2022-06-03 ENCOUNTER — Encounter (HOSPITAL_COMMUNITY): Payer: Self-pay

## 2022-06-03 ENCOUNTER — Other Ambulatory Visit (HOSPITAL_COMMUNITY): Payer: Self-pay

## 2022-06-03 DIAGNOSIS — T7840XA Allergy, unspecified, initial encounter: Secondary | ICD-10-CM | POA: Diagnosis not present

## 2022-06-03 DIAGNOSIS — K122 Cellulitis and abscess of mouth: Secondary | ICD-10-CM | POA: Diagnosis not present

## 2022-06-03 DIAGNOSIS — J320 Chronic maxillary sinusitis: Secondary | ICD-10-CM | POA: Diagnosis not present

## 2022-06-03 LAB — CBC WITH DIFFERENTIAL/PLATELET
Abs Immature Granulocytes: 0.01 10*3/uL (ref 0.00–0.07)
Basophils Absolute: 0 10*3/uL (ref 0.0–0.1)
Basophils Relative: 0 %
Eosinophils Absolute: 0 10*3/uL (ref 0.0–0.5)
Eosinophils Relative: 0 %
HCT: 48.1 % (ref 39.0–52.0)
Hemoglobin: 14.7 g/dL (ref 13.0–17.0)
Immature Granulocytes: 0 %
Lymphocytes Relative: 6 %
Lymphs Abs: 0.4 10*3/uL — ABNORMAL LOW (ref 0.7–4.0)
MCH: 24.1 pg — ABNORMAL LOW (ref 26.0–34.0)
MCHC: 30.6 g/dL (ref 30.0–36.0)
MCV: 78.9 fL — ABNORMAL LOW (ref 80.0–100.0)
Monocytes Absolute: 0.3 10*3/uL (ref 0.1–1.0)
Monocytes Relative: 4 %
Neutro Abs: 6.6 10*3/uL (ref 1.7–7.7)
Neutrophils Relative %: 90 %
Platelets: 261 10*3/uL (ref 150–400)
RBC: 6.1 MIL/uL — ABNORMAL HIGH (ref 4.22–5.81)
RDW: 15.7 % — ABNORMAL HIGH (ref 11.5–15.5)
WBC: 7.3 10*3/uL (ref 4.0–10.5)
nRBC: 0 % (ref 0.0–0.2)

## 2022-06-03 LAB — COMPREHENSIVE METABOLIC PANEL
ALT: 29 U/L (ref 0–44)
AST: 28 U/L (ref 15–41)
Albumin: 3.9 g/dL (ref 3.5–5.0)
Alkaline Phosphatase: 47 U/L (ref 38–126)
Anion gap: 10 (ref 5–15)
BUN: 14 mg/dL (ref 6–20)
CO2: 26 mmol/L (ref 22–32)
Calcium: 8.9 mg/dL (ref 8.9–10.3)
Chloride: 103 mmol/L (ref 98–111)
Creatinine, Ser: 1 mg/dL (ref 0.61–1.24)
GFR, Estimated: >60 mL/min (ref >60)
Glucose, Bld: 115 mg/dL — ABNORMAL HIGH (ref 70–99)
Potassium: 4.3 mmol/L (ref 3.5–5.1)
Sodium: 139 mmol/L (ref 135–145)
Total Bilirubin: 0.8 mg/dL (ref 0.3–1.2)
Total Protein: 7.3 g/dL (ref 6.5–8.1)

## 2022-06-03 LAB — LIPASE, BLOOD: Lipase: 25 U/L (ref 11–51)

## 2022-06-03 LAB — GROUP A STREP BY PCR: Group A Strep by PCR: NOT DETECTED

## 2022-06-03 MED ORDER — AMOXICILLIN-POT CLAVULANATE 875-125 MG PO TABS: 1.0000 | ORAL_TABLET | Freq: Two times a day (BID) | ORAL | 0 refills | Status: DC

## 2022-06-03 MED ORDER — EPINEPHRINE 0.3 MG/0.3ML IJ SOAJ
0.3000 mg | INTRAMUSCULAR | 0 refills | Status: DC | PRN
  Filled 2022-06-03: qty 2, 15d supply, fill #0

## 2022-06-03 MED ORDER — IOHEXOL 350 MG/ML SOLN
75.0000 mL | Freq: Once | INTRAVENOUS | Status: AC | PRN
  Administered 2022-06-03: 75 mL via INTRAVENOUS

## 2022-06-03 MED ORDER — EPINEPHRINE 0.3 MG/0.3ML IJ SOAJ
INTRAMUSCULAR | Status: AC
  Administered 2022-06-03: 0.3 mg
  Filled 2022-06-03: qty 0.3

## 2022-06-03 MED ORDER — PREDNISONE 20 MG PO TABS
20.0000 mg | ORAL_TABLET | Freq: Every day | ORAL | 0 refills | Status: DC
  Filled 2022-06-03: qty 5, 5d supply, fill #0

## 2022-06-03 MED ORDER — ONDANSETRON HCL 4 MG/2ML IJ SOLN
4.0000 mg | Freq: Once | INTRAMUSCULAR | Status: AC
  Administered 2022-06-03: 4 mg via INTRAVENOUS
  Filled 2022-06-03: qty 2

## 2022-06-03 MED ORDER — FAMOTIDINE IN NACL 20-0.9 MG/50ML-% IV SOLN
20.0000 mg | Freq: Once | INTRAVENOUS | Status: AC
  Administered 2022-06-03: 20 mg via INTRAVENOUS
  Filled 2022-06-03: qty 50

## 2022-06-03 MED ORDER — DIPHENHYDRAMINE HCL 50 MG/ML IJ SOLN
25.0000 mg | Freq: Once | INTRAMUSCULAR | Status: AC
  Administered 2022-06-03: 25 mg via INTRAVENOUS
  Filled 2022-06-03: qty 1

## 2022-06-03 MED ORDER — PREDNISONE 20 MG PO TABS: 20.0000 mg | ORAL_TABLET | Freq: Every day | ORAL | 0 refills | Status: DC

## 2022-06-03 MED ORDER — AMOXICILLIN-POT CLAVULANATE 875-125 MG PO TABS: 1.0000 | ORAL_TABLET | Freq: Two times a day (BID) | ORAL | 0 refills | Status: AC

## 2022-06-03 MED ORDER — PREDNISONE 20 MG PO TABS
60.0000 mg | ORAL_TABLET | Freq: Once | ORAL | Status: AC
  Administered 2022-06-03: 60 mg via ORAL
  Filled 2022-06-03: qty 3

## 2022-06-03 MED ORDER — METHYLPREDNISOLONE SODIUM SUCC 125 MG IJ SOLR
125.0000 mg | Freq: Once | INTRAMUSCULAR | Status: AC
  Administered 2022-06-03: 125 mg via INTRAVENOUS
  Filled 2022-06-03: qty 2

## 2022-06-03 NOTE — ED Provider Notes (Signed)
[place] EMERGENCY DEPARTMENT AT [facility] Provider Note   CSN: [id number] Arrival date & time: 06/03/22  1159    History  Chief Complaint  Patient presents with   Allergic Reaction    David Gibson. is a 57 y.o. male with a past medical history here for evaluation of possible allergic reaction.  Patient states he made some corn liquor yesterday evening.  He woke up this morning and felt like his throat was tight and closing off.  No history of similar.  He has not any home medications, specifically ACE inhibitor.  No lip swelling.  No rash.  1 episode of emesis prior to arrival as well as 1 episode here in the ED. No abd pain. No change in voice.  HPI     Home Medications Prior to Admission medications   Medication Sig Start Date End Date Taking? Authorizing Provider  EPINEPHrine 0.3 mg/0.3 mL IJ SOAJ injection Inject 0.3 mg into the muscle as needed for anaphylaxis. 06/03/22  Yes Graydon Fofana A, PA-C  predniSONE (DELTASONE) 20 MG tablet Take 1 tablet (20 mg total) by mouth daily for 5 days. 06/03/22 06/08/22 Yes Mareon Robinette A, PA-C  benzonatate (TESSALON) 100 MG capsule Take 1 capsule (100 mg total) by mouth 2 (two) times daily as needed for cough. 04/18/22   Roxy Horseman, PA-C  HYDROcodone-acetaminophen (NORCO/VICODIN) 5-325 MG tablet Take 1 tablet by mouth every 6 (six) hours as needed. 08/24/21   Charlynne Pander, MD  lidocaine (LIDODERM) 5 % Place 1 patch onto the skin every 12 (twelve) hours. Remove & Discard patch within 12 hours or as directed by MD 07/06/19   Myra Rude, MD  naproxen (NAPROSYN) 500 MG tablet Take 1 tablet (500 mg total) by mouth 2 (two) times daily. 01/14/21   Horton, Mayer Masker, MD  oseltamivir (TAMIFLU) 75 MG capsule Take 1 capsule (75 mg total) by mouth every 12 (twelve) hours. 04/18/22   Roxy Horseman, PA-C  oxyCODONE-acetaminophen (PERCOCET) 5-325 MG tablet Take 1-2 tablets by mouth every 4 (four) hours as needed for  severe pain. 07/20/19   Myra Rude, MD  penicillin v potassium (VEETID) 500 MG tablet Take 1 tablet (500 mg total) by mouth 4 (four) times daily. 01/14/21   Horton, Mayer Masker, MD  amiodarone (PACERONE) 200 MG tablet Take 1 tablet (200 mg total) by mouth daily. 11/25/20 06/04/21  Bensimhon, Bevelyn Buckles, MD      Allergies    Patient has no known allergies.    Review of Systems   Review of Systems  HENT:  Positive for sore throat and trouble swallowing. Negative for voice change.   Respiratory: Negative.    Cardiovascular: Negative.   Gastrointestinal:  Positive for nausea and vomiting. Negative for abdominal distention, abdominal pain and anal bleeding.  Genitourinary: Negative.   Musculoskeletal: Negative.   All other systems reviewed and are negative.   Physical Exam Updated Vital Signs BP 116/71   Pulse 70   Temp 98 F (36.7 C) (Oral)   Resp 19   Ht 5\' 8"  (1.727 m)   Wt 90.7 kg   SpO2 98%   BMI 30.41 kg/m  Physical Exam Vitals and nursing note reviewed.  Constitutional:      General: He is not in acute distress.    Appearance: He is well-developed. He is not ill-appearing, toxic-appearing or diaphoretic.  HENT:     Head: Normocephalic and atraumatic.     Nose: Nose normal.  Mouth/Throat:     Lips: Pink.     Mouth: Mucous membranes are moist.     Pharynx: Pharyngeal swelling, posterior oropharyngeal erythema and uvula swelling present. No oropharyngeal exudate.     Comments: Uvula midline however very edematous with erythema.  No pooling of secretions. Mild tongue swelling. Cannot visualized tonsils Eyes:     Pupils: Pupils are equal, round, and reactive to light.  Neck:     Trachea: Trachea and phonation normal.     Comments: No stridor Cardiovascular:     Rate and Rhythm: Normal rate and regular rhythm.  Pulmonary:     Effort: Pulmonary effort is normal. No respiratory distress.     Breath sounds: Normal breath sounds and air entry.  Abdominal:      General: There is no distension.     Palpations: Abdomen is soft.  Musculoskeletal:        General: Normal range of motion.     Cervical back: Full passive range of motion without pain, normal range of motion and neck supple.  Skin:    General: Skin is warm and dry.  Neurological:     General: No focal deficit present.     Mental Status: He is alert and oriented to person, place, and time.     ED Results / Procedures / Treatments   Labs (all labs ordered are listed, but only abnormal results are displayed) Labs Reviewed  CBC WITH DIFFERENTIAL/PLATELET - Abnormal; Notable for the following components:      Result Value   RBC 6.10 (*)    MCV 78.9 (*)    MCH 24.1 (*)    RDW 15.7 (*)    Lymphs Abs 0.4 (*)    All other components within normal limits  GROUP A STREP BY PCR  COMPREHENSIVE METABOLIC PANEL  LIPASE, BLOOD    EKG None  Radiology No results found.  Procedures .Critical Care  Performed by: Linwood Dibbles, PA-C Authorized by: Linwood Dibbles, PA-C   Critical care provider statement:    Critical care time (minutes):  30   Critical care was necessary to treat or prevent imminent or life-threatening deterioration of the following conditions:  Toxidrome   Critical care was time spent personally by me on the following activities:  Development of treatment plan with patient or surrogate, discussions with consultants, evaluation of patient's response to treatment, examination of patient, ordering and review of laboratory studies, ordering and review of radiographic studies, ordering and performing treatments and interventions, pulse oximetry, re-evaluation of patient's condition and review of old charts     Medications Ordered in ED Medications  EPINEPHrine (EPI-PEN) 0.3 mg/0.3 mL injection (0.3 mg  Given 06/03/22 1229)  diphenhydrAMINE (BENADRYL) injection 25 mg (25 mg Intravenous Given 06/03/22 1302)  methylPREDNISolone sodium succinate (SOLU-MEDROL) 125 mg/2 mL  injection 125 mg (125 mg Intravenous Given 06/03/22 1301)  famotidine (PEPCID) IVPB 20 mg premix (0 mg Intravenous Stopped 06/03/22 1343)  ondansetron (ZOFRAN) injection 4 mg (4 mg Intravenous Given 06/03/22 1311)   ED Course/ Medical Decision Making/ A&P    57 year old here for evaluation of tongue swelling. Drank homemade corn liquor last night awoke this morning to sensation of throat closing. No lip swelling. Moderate uvula edema with erythema, mild tongue swelling. No rash.  Patient given medications for possible anaphylaxis.  No lip swelling.  Also had some nausea and vomiting unclear if this is related to EtOH use.  Denies any abdominal pain has benign abdominal exam.  Will plan on labs and reassessment.  Uvula edema improved with medications however still has some moderate swelling.  No pooling of secretions  Labs and imaging personally viewed and interpreted:  CBC without leukocytosis Strep negative    Lipase, metabolic panel, CT soft tissue  Transferred to oncoming provider, Laveda Norman, PA-C who will follow-up on remaining labs, imaging and determine disposition.  Question infectious versus inflammatory etiology?  If CT scan without evidence of infection may DC home with steroids, if infectious process is shown treat as such                              Medical Decision Making Amount and/or Complexity of Data Reviewed External Data Reviewed: labs and notes. Labs: ordered. Decision-making details documented in ED Course. Radiology: ordered.  Risk OTC drugs. Prescription drug management. Parenteral controlled substances. Decision regarding hospitalization. Diagnosis or treatment significantly limited by social determinants of health.           Final Clinical Impression(s) / ED Diagnoses Final diagnoses:  Allergic reaction, initial encounter  Uvulitis    Rx / DC Orders ED Discharge Orders          Ordered    EPINEPHrine 0.3 mg/0.3 mL IJ SOAJ injection  As  needed        06/03/22 1543    predniSONE (DELTASONE) 20 MG tablet  Daily        06/03/22 1543              Caliann Leckrone A, PA-C 06/03/22 1544    Benjiman Core, MD 06/06/22 [phone]

## 2022-06-03 NOTE — ED Provider Notes (Signed)
Received signout from previous provider, please see his note for complete H&P.  Patient drink his homemade corn liquor last night and subsequently developed throat irritation with swelling of his uvula.  Symptoms persistent at which concerns him.  He was seen in the ED, was given epinephrine and neck soft tissue CT scan was obtained.  CT was independently viewed by me and fortunately without any concerning finding.  On exam patient does have an enlarged uvula as well as edema to his posterior oropharyngeal region without any significant tonsillar enlargement and no obvious abscess noted.  He has normal phonation, no signs of airway compromise, and overall well-appearing.  He denies any other environmental changes that can contribute to his symptoms.  He does endorse some problems with dentition but this is not new.  Patient was initially receiving EpiPen for concerns of potential allergic reaction causing airway compromise.  Patient has been monitored for the past 7 hours without worsening of symptoms.  At this time he is stable to be discharged home.  I did agree to prescribe antibiotic for potential infectious etiology but recommend patient to wait for 3 days before take antibiotic if no improvement.  Patient agrees.  Return precaution given.  BP (!) 140/87   Pulse 65   Temp 98.4 F (36.9 C) (Oral)   Resp (!) 23   Ht 5\' 8"  (1.727 m)   Wt 90.7 kg   SpO2 100%   BMI 30.41 kg/m   Results for orders placed or performed during the hospital encounter of 06/03/22  Group A Strep by PCR   Specimen: Throat; Sterile Swab  Result Value Ref Range   Group A Strep by PCR NOT DETECTED NOT DETECTED  CBC with Differential  Result Value Ref Range   WBC 7.3 4.0 - 10.5 K/uL   RBC 6.10 (H) 4.22 - 5.81 MIL/uL   Hemoglobin 14.7 13.0 - 17.0 g/dL   HCT 16.1 09.6 - 04.5 %   MCV 78.9 (L) 80.0 - 100.0 fL   MCH 24.1 (L) 26.0 - 34.0 pg   MCHC 30.6 30.0 - 36.0 g/dL   RDW 40.9 (H) 81.1 - 91.4 %   Platelets 261 150 -  400 K/uL   nRBC 0.0 0.0 - 0.2 %   Neutrophils Relative % 90 %   Neutro Abs 6.6 1.7 - 7.7 K/uL   Lymphocytes Relative 6 %   Lymphs Abs 0.4 (L) 0.7 - 4.0 K/uL   Monocytes Relative 4 %   Monocytes Absolute 0.3 0.1 - 1.0 K/uL   Eosinophils Relative 0 %   Eosinophils Absolute 0.0 0.0 - 0.5 K/uL   Basophils Relative 0 %   Basophils Absolute 0.0 0.0 - 0.1 K/uL   Immature Granulocytes 0 %   Abs Immature Granulocytes 0.01 0.00 - 0.07 K/uL  Comprehensive metabolic panel  Result Value Ref Range   Sodium 139 135 - 145 mmol/L   Potassium 4.3 3.5 - 5.1 mmol/L   Chloride 103 98 - 111 mmol/L   CO2 26 22 - 32 mmol/L   Glucose, Bld 115 (H) 70 - 99 mg/dL   BUN 14 6 - 20 mg/dL   Creatinine, Ser 7.82 0.61 - 1.24 mg/dL   Calcium 8.9 8.9 - 95.6 mg/dL   Total Protein 7.3 6.5 - 8.1 g/dL   Albumin 3.9 3.5 - 5.0 g/dL   AST 28 15 - 41 U/L   ALT 29 0 - 44 U/L   Alkaline Phosphatase 47 38 - 126 U/L   Total  Bilirubin 0.8 0.3 - 1.2 mg/dL   GFR, Estimated >40 >98 mL/min   Anion gap 10 5 - 15  Lipase, blood  Result Value Ref Range   Lipase 25 11 - 51 U/L   CT Soft Tissue Neck W Contrast  Result Date: 06/03/2022 CLINICAL DATA:  Epiglottitis tonsillitis suspected EXAM: CT NECK WITH CONTRAST TECHNIQUE: Multidetector CT imaging of the neck was performed using the standard protocol following the bolus administration of intravenous contrast. RADIATION DOSE REDUCTION: This exam was performed according to the departmental dose-optimization program which includes automated exposure control, adjustment of the mA and/or kV according to patient size and/or use of iterative reconstruction technique. CONTRAST:  75mL OMNIPAQUE IOHEXOL 350 MG/ML SOLN COMPARISON:  CT Neck 01/13/21 FINDINGS: Pharynx and larynx: The epiglottis is normal in appearance. Bilateral are normal in appearance. No evidence of Peri or intra tonsillar abscess. Salivary glands: No inflammation, mass, or stone. Thyroid: Normal. Lymph nodes: None enlarged or  abnormal density. Vascular: Negative. Limited intracranial: Negative. Visualized orbits: Negative. Mastoids and visualized paranasal sinuses: No middle ear or mastoid effusion. Mild thickening in the right maxillary sinus. Skeleton: No acute or aggressive process. Upper chest: Negative. Other: Markedly carious dentition with multiple periapical lucencies along the maxillary arch on the right IMPRESSION: 1. No evidence of epiglottitis or peritonsillar abscess. 2. Markedly carious dentition with multiple periapical lucencies along the maxillary arch on the right. Electronically Signed   By: Lorenza Cambridge M.D.   On: 06/03/2022 17:26      Fayrene Helper, PA-C 06/03/22 1856    Wynetta Fines, MD 06/04/22 [phone]

## 2022-06-03 NOTE — ED Triage Notes (Signed)
Pt states he drank some corn liquor last night and woke up with oral swelling. Pt states he was fine last night but woke swollen and 1 event of emesis.

## 2022-06-03 NOTE — ED Notes (Signed)
Pt provided emesis bag and vomited 1x while in bed.

## 2022-06-03 NOTE — Discharge Instructions (Addendum)
You were seen in the emergency department for swelling to your throat.  Fortunately CT scan of the neck today did not show any concerning finding.  Please take prednisone as prescribed as will help with swelling.  If you notice no improvement of your symptoms after 3 days, you may take antibiotics prescribed to treat for potential infection.  Please return promptly if you have any trouble breathing or if you have other concern.

## 2022-06-04 ENCOUNTER — Other Ambulatory Visit (HOSPITAL_BASED_OUTPATIENT_CLINIC_OR_DEPARTMENT_OTHER): Payer: Self-pay

## 2022-06-04 ENCOUNTER — Other Ambulatory Visit (HOSPITAL_COMMUNITY): Payer: Self-pay

## 2022-06-04 ENCOUNTER — Other Ambulatory Visit: Payer: Self-pay

## 2022-06-04 ENCOUNTER — Encounter: Payer: Self-pay | Admitting: Pulmonary Disease

## 2022-06-04 ENCOUNTER — Ambulatory Visit (INDEPENDENT_AMBULATORY_CARE_PROVIDER_SITE_OTHER): Payer: 59

## 2022-06-04 VITALS — BP 117/71 | HR 52 | Temp 98.2°F | Resp 18 | Ht 70.5 in | Wt 231.0 lb

## 2022-06-04 DIAGNOSIS — D8685 Sarcoid myocarditis: Secondary | ICD-10-CM | POA: Diagnosis not present

## 2022-06-04 DIAGNOSIS — D869 Sarcoidosis, unspecified: Secondary | ICD-10-CM

## 2022-06-04 DIAGNOSIS — Z79899 Other long term (current) drug therapy: Secondary | ICD-10-CM

## 2022-06-04 MED ORDER — ACETAMINOPHEN 325 MG PO TABS
650.0000 mg | ORAL_TABLET | Freq: Once | ORAL | Status: AC
  Administered 2022-06-04: 650 mg via ORAL
  Filled 2022-06-04: qty 2

## 2022-06-04 MED ORDER — DIPHENHYDRAMINE HCL 25 MG PO CAPS
25.0000 mg | ORAL_CAPSULE | Freq: Once | ORAL | Status: AC
  Administered 2022-06-04: 25 mg via ORAL
  Filled 2022-06-04: qty 1

## 2022-06-04 MED ORDER — SODIUM CHLORIDE 0.9 % IV SOLN
3.0000 mg/kg | Freq: Once | INTRAVENOUS | Status: AC
  Administered 2022-06-04: 300 mg via INTRAVENOUS
  Filled 2022-06-04: qty 30

## 2022-06-04 MED ORDER — SILDENAFIL CITRATE 20 MG PO TABS
40.0000 mg | ORAL_TABLET | ORAL | 0 refills | Status: DC | PRN
  Filled 2022-06-04: qty 50, 17d supply, fill #0

## 2022-06-04 MED ORDER — METHYLPREDNISOLONE SODIUM SUCC 40 MG IJ SOLR
40.0000 mg | Freq: Once | INTRAMUSCULAR | Status: AC
  Administered 2022-06-04: 40 mg via INTRAVENOUS
  Filled 2022-06-04: qty 1

## 2022-06-04 NOTE — Progress Notes (Signed)
Diagnosis: Cardiac sarcoidosis    Provider:  Chilton Greathouse MD  Procedure: IV Infusion  IV Type: Peripheral, IV Location: R Antecubital  Avsola (infliximab-axxq), Dose: 300 mg  Infusion Start Time: 1406  Infusion Stop Time: 1613  Post Infusion IV Care: Observation period completed and Peripheral IV Discontinued  Discharge: Condition: Good, Destination: Home . AVS Provided  Performed by:  Garnette Czech, RN

## 2022-06-04 NOTE — Patient Instructions (Signed)
Infliximab Injection What is this medication? INFLIXIMAB (in FLIX i mab) treats autoimmune conditions, such as psoriasis, arthritis, Crohn's disease, and ulcerative colitis. It works by slowing down an overactive immune system. It belongs to a group of medications called TNF inhibitors. It is a monoclonal antibody. This medicine may be used for other purposes; ask your health care provider or pharmacist if you have questions. COMMON BRAND NAME(S): AVSOLA, INFLECTRA, IXIFI, Remicade, RENFLEXIS What should I tell my care team before I take this medication? They need to know if you have any of these conditions: Cancer Current or past resident of [place] or [place] Diabetes Exposure to tuberculosis Guillain-Barre syndrome Heart failure Liver disease Immune system problems Infection Lung or breathing disease, such as COPD Multiple sclerosis Receiving phototherapy for the skin Seizure disorder An unusual or allergic reaction to infliximab, mouse proteins, other medications, foods, dyes, or preservatives Pregnant or trying to get pregnant Breast-feeding How should I use this medication? This medication is injected into a vein. It is usually given by a care team in a hospital or clinic setting. A special MedGuide will be given to you by the pharmacist with each prescription and refill. Be sure to read this information carefully each time. Talk to your care team about the use of this medication in children. While it may be prescribed for children as young as 6 years of age for selected conditions, precautions do apply. Overdosage: If you think you have taken too much of this medicine contact a poison control center or emergency room at once. NOTE: This medicine is only for you. Do not share this medicine with others. What if I miss a dose? Keep appointments for follow-up doses. It is important not to miss your dose. Call your care team if you are unable to keep an  appointment. What may interact with this medication? Do not take this medication with any of the following: Biologic medications, such as abatacept, adalimumab, anakinra, certolizumab, etanercept, golimumab, rituximab, secukinumab, tocilizumab, tofactinib, ustekinumab Live vaccines This list may not describe all possible interactions. Give your health care provider a list of all the medicines, herbs, non-prescription drugs, or dietary supplements you use. Also tell them if you smoke, drink alcohol, or use illegal drugs. Some items may interact with your medicine. What should I watch for while using this medication? Visit your care team for regular checks on your progress. Your condition will be monitored carefully while you are receiving this medication. You may need blood work done while you are taking this medication. You will be tested for tuberculosis (TB) before you start this medication. If your care team prescribes any medication for TB, you should start taking the TB medication before starting this medication. Make sure to finish the full course of TB medication. This medication may increase your risk of getting an infection. Call your care team for advice if you get a fever, chills, sore throat, or other symptoms of a cold or flu. Do not treat yourself. Try to avoid being around people who are sick. This medication may make the symptoms of heart failure worse in some patients. Contact your care team right away if you develop signs or symptoms of heart failure. Before having surgery or dental work, talk to your care team to make sure it is ok. This medication can increase the risk of poor healing of your surgical site or wound. If you take this medication for plaque psoriasis, stay out of the sun. If you cannot avoid being   in the sun, wear protective clothing and sunscreen. Do not use sun lamps or tanning beds/booths. Talk to your care team about your risk of cancer. You may be more at risk for  certain types of cancers if you take this medication. What side effects may I notice from receiving this medication? Side effects that you should report to your care team as soon as possible: Allergic reactions--skin rash, itching, hives, swelling of the face, lips, tongue, or throat Body pain, tingling, or numbness Heart attack--pain or tightness in the chest, shoulders, arms, or jaw, nausea, shortness of breath, cold or clammy skin, feeling faint or lightheaded Heart failure--shortness of breath, swelling of the ankles, feet, or hands, sudden weight gain, unusual weakness or fatigue Heart rhythm changes--fast or irregular heartbeat, dizziness, feeling faint or lightheaded, chest pain, trouble breathing Increase in blood pressure Infection--fever, chills, cough, sore throat, wounds that don't heal, pain or trouble when passing urine, general feeling of discomfort or being unwell Liver injury--right upper belly pain, loss of appetite, nausea, light-colored stool, dark yellow or brown urine, yellowing skin or eyes, unusual weakness or fatigue Low blood pressure--dizziness, feeling faint or lightheaded, blurry vision Lupus-like syndrome--joint pain, swelling, or stiffness, butterfly-shaped rash on the face, rashes that get worse in the sun, fever, unusual weakness or fatigue Seizures Stroke--sudden numbness or weakness of the face, arm, or leg, trouble speaking, confusion, trouble walking, loss of balance or coordination, dizziness, severe headache, change in vision Sudden vision loss in one or both eyes Unusual bruising or bleeding Side effects that usually do not require medical attention (report to your care team if they continue or are bothersome): Cough Diarrhea Fatigue Headache Nausea Runny or stuffy nose Sore throat Stomach pain This list may not describe all possible side effects. Call your doctor for medical advice about side effects. You may report side effects to FDA at  1-800-FDA-1088. Where should I keep my medication? This medication is given in a hospital or clinic. It will not be stored at home. NOTE: This sheet is a summary. It may not cover all possible information. If you have questions about this medicine, talk to your doctor, pharmacist, or health care provider.  2023 Elsevier/Gold Standard (2021-04-03 00:00:00)  

## 2022-06-05 ENCOUNTER — Encounter: Payer: Self-pay | Admitting: Pulmonary Disease

## 2022-06-05 ENCOUNTER — Other Ambulatory Visit (HOSPITAL_COMMUNITY): Payer: Self-pay

## 2022-06-05 NOTE — Addendum Note (Signed)
Addended by: Murrell Redden on: 06/05/2022 03:14 PM   Modules accepted: Orders

## 2022-06-06 ENCOUNTER — Telehealth (HOSPITAL_BASED_OUTPATIENT_CLINIC_OR_DEPARTMENT_OTHER): Payer: Self-pay | Admitting: Emergency Medicine

## 2022-06-06 MED ORDER — PREDNISONE 20 MG PO TABS: 20.0000 mg | ORAL_TABLET | Freq: Every day | ORAL | 0 refills | Status: AC

## 2022-06-06 MED ORDER — EPINEPHRINE 0.3 MG/0.3ML IJ SOAJ: 0.3000 mg | INTRAMUSCULAR | 0 refills | Status: AC | PRN

## 2022-06-06 NOTE — Telephone Encounter (Signed)
He requested meds sent to [facility] in [place]

## 2022-06-09 ENCOUNTER — Other Ambulatory Visit (HOSPITAL_COMMUNITY): Payer: Self-pay

## 2022-06-09 ENCOUNTER — Other Ambulatory Visit: Payer: Self-pay

## 2022-06-09 DIAGNOSIS — D8685 Sarcoid myocarditis: Secondary | ICD-10-CM | POA: Diagnosis not present

## 2022-06-09 DIAGNOSIS — I5042 Chronic combined systolic (congestive) and diastolic (congestive) heart failure: Secondary | ICD-10-CM | POA: Diagnosis not present

## 2022-06-09 DIAGNOSIS — Z95810 Presence of automatic (implantable) cardiac defibrillator: Secondary | ICD-10-CM | POA: Diagnosis not present

## 2022-06-09 DIAGNOSIS — I472 Ventricular tachycardia, unspecified: Secondary | ICD-10-CM | POA: Diagnosis not present

## 2022-06-09 MED ORDER — METFORMIN HCL ER 500 MG PO TB24
500.0000 mg | ORAL_TABLET | Freq: Every evening | ORAL | 0 refills | Status: AC
  Filled 2022-06-09: qty 90, 90d supply, fill #0

## 2022-06-09 MED ORDER — TAMSULOSIN HCL 0.4 MG PO CAPS
0.4000 mg | ORAL_CAPSULE | Freq: Every day | ORAL | 0 refills | Status: DC
  Filled 2022-06-09 – 2022-06-15 (×2): qty 90, 90d supply, fill #0

## 2022-06-09 NOTE — Progress Notes (Signed)
Remote ICD transmission.   

## 2022-06-15 ENCOUNTER — Other Ambulatory Visit (HOSPITAL_COMMUNITY): Payer: Self-pay

## 2022-06-15 ENCOUNTER — Other Ambulatory Visit: Payer: Self-pay

## 2022-06-22 ENCOUNTER — Ambulatory Visit (INDEPENDENT_AMBULATORY_CARE_PROVIDER_SITE_OTHER): Payer: 59

## 2022-06-22 ENCOUNTER — Other Ambulatory Visit (INDEPENDENT_AMBULATORY_CARE_PROVIDER_SITE_OTHER): Payer: 59

## 2022-06-22 VITALS — BP 108/70 | HR 64 | Temp 98.4°F | Resp 16 | Ht 70.5 in | Wt 229.0 lb

## 2022-06-22 DIAGNOSIS — D8685 Sarcoid myocarditis: Secondary | ICD-10-CM | POA: Diagnosis not present

## 2022-06-22 DIAGNOSIS — Z79899 Other long term (current) drug therapy: Secondary | ICD-10-CM | POA: Diagnosis not present

## 2022-06-22 DIAGNOSIS — D869 Sarcoidosis, unspecified: Secondary | ICD-10-CM

## 2022-06-22 DIAGNOSIS — K4090 Unilateral inguinal hernia, without obstruction or gangrene, not specified as recurrent: Secondary | ICD-10-CM | POA: Diagnosis not present

## 2022-06-22 LAB — CBC WITH DIFFERENTIAL/PLATELET
Basophils Absolute: 0 10*3/uL (ref 0.0–0.1)
Basophils Relative: 0.1 % (ref 0.0–3.0)
Eosinophils Absolute: 0.1 10*3/uL (ref 0.0–0.7)
Eosinophils Relative: 0.9 % (ref 0.0–5.0)
HCT: 41.8 % (ref 39.0–52.0)
Hemoglobin: 13.7 g/dL (ref 13.0–17.0)
Lymphocytes Relative: 25.8 % (ref 12.0–46.0)
Lymphs Abs: 1.9 10*3/uL (ref 0.7–4.0)
MCHC: 32.8 g/dL (ref 30.0–36.0)
MCV: 90.9 fl (ref 78.0–100.0)
Monocytes Absolute: 0.5 10*3/uL (ref 0.1–1.0)
Monocytes Relative: 6.6 % (ref 3.0–12.0)
Neutro Abs: 4.8 10*3/uL (ref 1.4–7.7)
Neutrophils Relative %: 66.6 % (ref 43.0–77.0)
Platelets: 179 10*3/uL (ref 150.0–400.0)
RBC: 4.6 Mil/uL (ref 4.22–5.81)
RDW: 18.3 % — ABNORMAL HIGH (ref 11.5–15.5)
WBC: 7.2 10*3/uL (ref 4.0–10.5)

## 2022-06-22 LAB — COMPREHENSIVE METABOLIC PANEL
ALT: 14 U/L (ref 0–53)
AST: 15 U/L (ref 0–37)
Albumin: 3.9 g/dL (ref 3.5–5.2)
Alkaline Phosphatase: 38 U/L — ABNORMAL LOW (ref 39–117)
BUN: 23 mg/dL (ref 6–23)
CO2: 27 mEq/L (ref 19–32)
Calcium: 8.9 mg/dL (ref 8.4–10.5)
Chloride: 106 mEq/L (ref 96–112)
Creatinine, Ser: 1.61 mg/dL — ABNORMAL HIGH (ref 0.40–1.50)
GFR: 47.98 mL/min — ABNORMAL LOW (ref >60.00)
Glucose, Bld: 85 mg/dL (ref 70–99)
Potassium: 3.9 mEq/L (ref 3.5–5.1)
Sodium: 140 mEq/L (ref 135–145)
Total Bilirubin: 0.5 mg/dL (ref 0.2–1.2)
Total Protein: 6.2 g/dL (ref 6.0–8.3)

## 2022-06-22 MED ORDER — SODIUM CHLORIDE 0.9 % IV SOLN
3.0000 mg/kg | Freq: Once | INTRAVENOUS | Status: AC
  Administered 2022-06-22: 300 mg via INTRAVENOUS
  Filled 2022-06-22: qty 30

## 2022-06-22 MED ORDER — ACETAMINOPHEN 325 MG PO TABS
650.0000 mg | ORAL_TABLET | Freq: Once | ORAL | Status: AC
  Administered 2022-06-22: 650 mg via ORAL
  Filled 2022-06-22: qty 2

## 2022-06-22 MED ORDER — DIPHENHYDRAMINE HCL 25 MG PO CAPS
25.0000 mg | ORAL_CAPSULE | Freq: Once | ORAL | Status: AC
  Administered 2022-06-22: 25 mg via ORAL
  Filled 2022-06-22: qty 1

## 2022-06-22 MED ORDER — METHYLPREDNISOLONE SODIUM SUCC 40 MG IJ SOLR
40.0000 mg | Freq: Once | INTRAMUSCULAR | Status: AC
  Administered 2022-06-22: 40 mg via INTRAVENOUS
  Filled 2022-06-22: qty 1

## 2022-06-22 NOTE — Progress Notes (Signed)
Diagnosis: Crohn's Disease  Provider:  Chilton Greathouse MD  Procedure: IV Infusion  IV Type: Peripheral, IV Location: L Antecubital  Avsola (infliximab-axxq), Dose: 300 mg  Infusion Start Time: 1035  Infusion Stop Time: 1244  Post Infusion IV Care: Peripheral IV Discontinued  Discharge: Condition: Good, Destination: Home . AVS Declined  Performed by:  Loney Hering, LPN

## 2022-06-30 ENCOUNTER — Other Ambulatory Visit (HOSPITAL_COMMUNITY): Payer: Self-pay | Admitting: Internal Medicine

## 2022-06-30 ENCOUNTER — Other Ambulatory Visit: Payer: Self-pay

## 2022-06-30 ENCOUNTER — Other Ambulatory Visit (HOSPITAL_COMMUNITY): Payer: Self-pay

## 2022-06-30 MED ORDER — SPIRONOLACTONE 25 MG PO TABS
25.0000 mg | ORAL_TABLET | Freq: Every day | ORAL | 3 refills | Status: DC
  Filled 2022-06-30: qty 90, 90d supply, fill #0
  Filled 2022-10-13: qty 90, 90d supply, fill #1
  Filled 2023-01-10: qty 90, 90d supply, fill #2
  Filled 2023-04-23: qty 90, 90d supply, fill #3

## 2022-07-01 DIAGNOSIS — H35069 Retinal vasculitis, unspecified eye: Secondary | ICD-10-CM | POA: Diagnosis not present

## 2022-07-01 DIAGNOSIS — N1832 Chronic kidney disease, stage 3b: Secondary | ICD-10-CM | POA: Diagnosis not present

## 2022-07-01 DIAGNOSIS — I5020 Unspecified systolic (congestive) heart failure: Secondary | ICD-10-CM | POA: Diagnosis not present

## 2022-07-01 DIAGNOSIS — N400 Enlarged prostate without lower urinary tract symptoms: Secondary | ICD-10-CM | POA: Diagnosis not present

## 2022-07-01 DIAGNOSIS — D8685 Sarcoid myocarditis: Secondary | ICD-10-CM | POA: Diagnosis not present

## 2022-07-07 ENCOUNTER — Other Ambulatory Visit: Payer: Self-pay | Admitting: Nephrology

## 2022-07-07 DIAGNOSIS — N1832 Chronic kidney disease, stage 3b: Secondary | ICD-10-CM

## 2022-07-07 DIAGNOSIS — I5020 Unspecified systolic (congestive) heart failure: Secondary | ICD-10-CM

## 2022-07-07 DIAGNOSIS — H35069 Retinal vasculitis, unspecified eye: Secondary | ICD-10-CM

## 2022-07-07 DIAGNOSIS — N400 Enlarged prostate without lower urinary tract symptoms: Secondary | ICD-10-CM

## 2022-07-07 DIAGNOSIS — D8685 Sarcoid myocarditis: Secondary | ICD-10-CM

## 2022-07-10 DIAGNOSIS — Z09 Encounter for follow-up examination after completed treatment for conditions other than malignant neoplasm: Secondary | ICD-10-CM | POA: Diagnosis not present

## 2022-07-14 ENCOUNTER — Other Ambulatory Visit (HOSPITAL_COMMUNITY): Payer: Self-pay

## 2022-07-14 DIAGNOSIS — E559 Vitamin D deficiency, unspecified: Secondary | ICD-10-CM | POA: Diagnosis not present

## 2022-07-14 DIAGNOSIS — Z0001 Encounter for general adult medical examination with abnormal findings: Secondary | ICD-10-CM | POA: Diagnosis not present

## 2022-07-14 DIAGNOSIS — R7301 Impaired fasting glucose: Secondary | ICD-10-CM | POA: Diagnosis not present

## 2022-07-14 DIAGNOSIS — N183 Chronic kidney disease, stage 3 unspecified: Secondary | ICD-10-CM | POA: Diagnosis not present

## 2022-07-14 DIAGNOSIS — D8685 Sarcoid myocarditis: Secondary | ICD-10-CM | POA: Diagnosis not present

## 2022-07-14 DIAGNOSIS — I5042 Chronic combined systolic (congestive) and diastolic (congestive) heart failure: Secondary | ICD-10-CM | POA: Diagnosis not present

## 2022-07-14 DIAGNOSIS — N1832 Chronic kidney disease, stage 3b: Secondary | ICD-10-CM | POA: Diagnosis not present

## 2022-07-14 DIAGNOSIS — D869 Sarcoidosis, unspecified: Secondary | ICD-10-CM | POA: Diagnosis not present

## 2022-07-14 MED ORDER — MAGNESIUM OXIDE 250 MG PO TABS
1.0000 | ORAL_TABLET | Freq: Every day | ORAL | 0 refills | Status: AC | PRN
  Filled 2022-07-27: qty 90, 90d supply, fill #0

## 2022-07-15 ENCOUNTER — Other Ambulatory Visit: Payer: Self-pay

## 2022-07-15 ENCOUNTER — Other Ambulatory Visit (HOSPITAL_COMMUNITY): Payer: Self-pay | Admitting: Internal Medicine

## 2022-07-15 ENCOUNTER — Other Ambulatory Visit (HOSPITAL_COMMUNITY): Payer: Self-pay

## 2022-07-15 MED ORDER — METHOTREXATE SODIUM 2.5 MG PO TABS
20.0000 mg | ORAL_TABLET | ORAL | 1 refills | Status: DC
  Filled 2022-07-15: qty 32, 28d supply, fill #0
  Filled 2022-08-18: qty 32, 28d supply, fill #1

## 2022-07-20 ENCOUNTER — Ambulatory Visit (INDEPENDENT_AMBULATORY_CARE_PROVIDER_SITE_OTHER): Payer: 59

## 2022-07-20 ENCOUNTER — Other Ambulatory Visit (INDEPENDENT_AMBULATORY_CARE_PROVIDER_SITE_OTHER): Payer: 59

## 2022-07-20 VITALS — BP 103/64 | HR 59 | Temp 98.0°F | Resp 18 | Ht 68.5 in | Wt 229.2 lb

## 2022-07-20 DIAGNOSIS — D8685 Sarcoid myocarditis: Secondary | ICD-10-CM

## 2022-07-20 DIAGNOSIS — Z79899 Other long term (current) drug therapy: Secondary | ICD-10-CM | POA: Diagnosis not present

## 2022-07-20 DIAGNOSIS — D869 Sarcoidosis, unspecified: Secondary | ICD-10-CM | POA: Diagnosis not present

## 2022-07-20 LAB — CBC WITH DIFFERENTIAL/PLATELET
Basophils Absolute: 0 10*3/uL (ref 0.0–0.1)
Basophils Relative: 0.2 % (ref 0.0–3.0)
Eosinophils Absolute: 0 10*3/uL (ref 0.0–0.7)
Eosinophils Relative: 0.3 % (ref 0.0–5.0)
HCT: 39.4 % (ref 39.0–52.0)
Hemoglobin: 12.7 g/dL — ABNORMAL LOW (ref 13.0–17.0)
Lymphocytes Relative: 18.3 % (ref 12.0–46.0)
Lymphs Abs: 1.9 10*3/uL (ref 0.7–4.0)
MCHC: 32.2 g/dL (ref 30.0–36.0)
MCV: 91.9 fl (ref 78.0–100.0)
Monocytes Absolute: 0.9 10*3/uL (ref 0.1–1.0)
Monocytes Relative: 8.5 % (ref 3.0–12.0)
Neutro Abs: 7.6 10*3/uL (ref 1.4–7.7)
Neutrophils Relative %: 72.7 % (ref 43.0–77.0)
Platelets: 151 10*3/uL (ref 150.0–400.0)
RBC: 4.29 Mil/uL (ref 4.22–5.81)
RDW: 18.2 % — ABNORMAL HIGH (ref 11.5–15.5)
WBC: 10.4 10*3/uL (ref 4.0–10.5)

## 2022-07-20 LAB — COMPREHENSIVE METABOLIC PANEL
ALT: 15 U/L (ref 0–53)
AST: 18 U/L (ref 0–37)
Albumin: 3.7 g/dL (ref 3.5–5.2)
Alkaline Phosphatase: 32 U/L — ABNORMAL LOW (ref 39–117)
BUN: 23 mg/dL (ref 6–23)
CO2: 22 mEq/L (ref 19–32)
Calcium: 8.7 mg/dL (ref 8.4–10.5)
Chloride: 110 mEq/L (ref 96–112)
Creatinine, Ser: 1.73 mg/dL — ABNORMAL HIGH (ref 0.40–1.50)
GFR: 43.99 mL/min — ABNORMAL LOW (ref >60.00)
Glucose, Bld: 100 mg/dL — ABNORMAL HIGH (ref 70–99)
Potassium: 3.9 mEq/L (ref 3.5–5.1)
Sodium: 140 mEq/L (ref 135–145)
Total Bilirubin: 0.3 mg/dL (ref 0.2–1.2)
Total Protein: 6.3 g/dL (ref 6.0–8.3)

## 2022-07-20 MED ORDER — METHYLPREDNISOLONE SODIUM SUCC 40 MG IJ SOLR
40.0000 mg | Freq: Once | INTRAMUSCULAR | Status: AC
  Administered 2022-07-20: 40 mg via INTRAVENOUS
  Filled 2022-07-20: qty 1

## 2022-07-20 MED ORDER — ACETAMINOPHEN 325 MG PO TABS
650.0000 mg | ORAL_TABLET | Freq: Once | ORAL | Status: AC
  Administered 2022-07-20: 650 mg via ORAL
  Filled 2022-07-20: qty 2

## 2022-07-20 MED ORDER — DIPHENHYDRAMINE HCL 25 MG PO CAPS
25.0000 mg | ORAL_CAPSULE | Freq: Once | ORAL | Status: AC
  Administered 2022-07-20: 25 mg via ORAL
  Filled 2022-07-20: qty 1

## 2022-07-20 MED ORDER — SODIUM CHLORIDE 0.9 % IV SOLN
3.0000 mg/kg | Freq: Once | INTRAVENOUS | Status: AC
  Administered 2022-07-20: 300 mg via INTRAVENOUS
  Filled 2022-07-20: qty 30

## 2022-07-20 NOTE — Progress Notes (Signed)
Diagnosis: Cardiac Sarcoidosis  Provider:  Chilton Greathouse MD  Procedure: IV Infusion  IV Type: Peripheral, IV Location: R Antecubital  Avsola (infliximab-axxq), Dose: 300 mg  Infusion Start Time: 1125  Infusion Stop Time: 1339  Post Infusion IV Care: Peripheral IV Discontinued  Discharge: Condition: Good, Destination: Home . AVS Provided  Performed by:  Garnette Czech, RN

## 2022-07-28 ENCOUNTER — Other Ambulatory Visit (HOSPITAL_COMMUNITY): Payer: Self-pay

## 2022-07-28 ENCOUNTER — Ambulatory Visit (INDEPENDENT_AMBULATORY_CARE_PROVIDER_SITE_OTHER): Payer: 59

## 2022-07-28 DIAGNOSIS — I5041 Acute combined systolic (congestive) and diastolic (congestive) heart failure: Secondary | ICD-10-CM

## 2022-07-29 ENCOUNTER — Ambulatory Visit
Admission: RE | Admit: 2022-07-29 | Discharge: 2022-07-29 | Disposition: A | Discharge status: Home / Self Care | Payer: 59 | Source: Ambulatory Visit | Attending: Family Medicine | Admitting: Family Medicine

## 2022-07-29 ENCOUNTER — Other Ambulatory Visit (HOSPITAL_COMMUNITY): Payer: Self-pay

## 2022-07-29 ENCOUNTER — Other Ambulatory Visit: Payer: Self-pay | Admitting: Family Medicine

## 2022-07-29 ENCOUNTER — Ambulatory Visit
Admission: RE | Admit: 2022-07-29 | Discharge: 2022-07-29 | Disposition: A | Discharge status: Home / Self Care | Payer: 59 | Source: Ambulatory Visit | Attending: Nephrology | Admitting: Nephrology

## 2022-07-29 DIAGNOSIS — M79641 Pain in right hand: Secondary | ICD-10-CM

## 2022-07-29 DIAGNOSIS — I5020 Unspecified systolic (congestive) heart failure: Secondary | ICD-10-CM

## 2022-07-29 DIAGNOSIS — H35069 Retinal vasculitis, unspecified eye: Secondary | ICD-10-CM

## 2022-07-29 DIAGNOSIS — N1832 Chronic kidney disease, stage 3b: Secondary | ICD-10-CM

## 2022-07-29 DIAGNOSIS — D8685 Sarcoid myocarditis: Secondary | ICD-10-CM

## 2022-07-29 DIAGNOSIS — M19071 Primary osteoarthritis, right ankle and foot: Secondary | ICD-10-CM | POA: Diagnosis not present

## 2022-07-29 DIAGNOSIS — N400 Enlarged prostate without lower urinary tract symptoms: Secondary | ICD-10-CM

## 2022-07-29 DIAGNOSIS — N189 Chronic kidney disease, unspecified: Secondary | ICD-10-CM | POA: Diagnosis not present

## 2022-07-31 DIAGNOSIS — Z09 Encounter for follow-up examination after completed treatment for conditions other than malignant neoplasm: Secondary | ICD-10-CM | POA: Diagnosis not present

## 2022-08-03 LAB — CUP PACEART REMOTE DEVICE CHECK
Date Time Interrogation Session: 20240628125903
Implantable Lead Connection Status: 753985
Implantable Lead Implant Date: 20220926
Implantable Lead Location: 753860
Implantable Lead Model: 436910
Implantable Lead Serial Number: 81470915
Implantable Pulse Generator Implant Date: 20220926
Pulse Gen Model: 429525
Pulse Gen Serial Number: 84847461

## 2022-08-04 ENCOUNTER — Encounter: Payer: Self-pay | Admitting: Pulmonary Disease

## 2022-08-04 ENCOUNTER — Encounter (HOSPITAL_COMMUNITY): Payer: Self-pay

## 2022-08-04 ENCOUNTER — Other Ambulatory Visit (HOSPITAL_COMMUNITY): Payer: Self-pay

## 2022-08-05 ENCOUNTER — Encounter: Payer: 59 | Admitting: Internal Medicine

## 2022-08-13 NOTE — Progress Notes (Signed)
Office Visit Note  Patient: Nathan [name]             Date of Birth: 03-Oct-1965           MRN: [id number]             PCP: Dois Davenport, MD Referring: Jacklynn Ganong, FNP Visit Date: 08/14/2022 Occupation: Mechanic/welder  Subjective:  New Patient (Initial Visit) (Sarcoidosis)   History of Present Illness: Nathan [name] is a 57 y.o. male here for evaluation of sarcoidosis also with complaint of bilateral but mostly right-sided hand pain.  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  He was started on treatment with methotrexate and steroid eyedrops at that time.  Had some additional autoimmune workup that was negative for evidence of rheumatoid arthritis or systemic inflammation at the time. Was on maintenance treatment until he stopped due to losing his job in 2021. Continued off any specific maintenance treatment without severe exacerbation of symptoms until in 2022.  Where he started developing initially some mild cough and orthopnea but then progressed to more severe lower extremity swelling and dyspnea on exertion.  Outpatient clinic exam with bulky lymphadenopathy large concerning pulmonary nodules.  Was hospitalized in September 2022 had bronchoscopy with biopsies negative for malignancy.  Cardiac workup at the time with ejection fraction of 25% and cardiac MRI consistent with 18% and a late gadolinium enhancement pattern consistent with cardiac sarcoidosis.  He was started on prednisone and methotrexate and discharged with LifeVest due to frequent PVCs.  He was readmitted later that month after multiple alarms and had ICD placement. He tapered off the prednisone on just methotrexate maintenance at 20 mg p.o. weekly.  Subsequent improvement in left ventricular ejection fraction up to 30 to 35%.  Went for PET scan in September last year this demonstrated ongoing extracardiac  sarcoidosis activity with some cardiac disease involving only 5% of the left ventricular myocardium.  Did have return of significant symptoms positional and with exertion combined with his findings was restarted on prednisone since March.  Was also recommended to follow-up again with Dr. Dierdre Forth his previous rheumatologist unable to do so at the time due to outstanding office bill.  Currently maintained on prednisone 20 mg daily.  Also saw Dr. Everardo All and started on Avsola infusion completed initial 3 loading doses so far. He experiences joint pain in both hands more severe on the right particularly around the MCP joints.  He has noticed a little bit in the past but more symptomatic for the past 3 months.  Had x-rays checked from his primary care office consistent with osteoarthritis.  Never had any specific injuries or surgery to the affected area.  He thinks there is some swelling as the knuckles look bigger than usual and often feels stiff and tight.  Has not noticed a very big difference since resuming the prednisone.  Takes Tylenol and uses topical Voltaren intermittently these are partially helpful. He does not complain of any skin rashes.  Imaging reviewed 07/29/22 Xray right hand 3 views IMPRESSION: 1. Mild-to-moderate third metacarpophalangeal joint osteoarthritis. 2. Mild-to-moderate thumb interphalangeal and second and third DIP osteoarthritis. 3. Moderate thumb carpometacarpal osteoarthritis.  10/27/21 PET/CT FINAL COMMENTS  Metabolism.   Cardiac PET/CT metabolic study with F-18 FDG and scanned on PET [facility]:   1. There are few segments with hypermetabolic activity suggestive of an active inflammatory process in the left ventricular  myocardium.   2. Approximately 5% of the left ventricle is involved with predominantly mild/moderate hypermetabolic activity.   3. There is no evidence of abnormal metabolism in the right ventricle.   4. In the appropriate clinical context with positive  biomarkers, these findings may represent active cardiac sarcoidosis.   Perfusion/Function.   Gated cardiac PET/CT rest myocardial perfusion study with [id number] demonstrates:   1. Abnormal myocardial perfusion in a non-CAD pattern.   2. Severe dilated left ventricle with severe left ventricular systolic dysfunction.   Non-diagnostic limited  CT obtained for PET attenuation correction:   1. There are no discernible coronary artery calcifications.   2. There are extensive areas of extracardiac hypermetabolic activity noted on the limited field of view, specifically "bulky" bilateral hilar and mediastinal lymph nodes. If clinically indicated/concern for systemic disease, a whole body PET/CT may be obtained for further evaluation.   Activities of Daily Living:  Patient reports morning stiffness for 20 minutes.   Patient Reports nocturnal pain.  Difficulty dressing/grooming: Denies Difficulty climbing stairs: Denies Difficulty getting out of chair: Denies Difficulty using hands for taps, buttons, cutlery, and/or writing: Reports  Review of Systems  Constitutional:  Positive for fatigue.  HENT:  Positive for mouth sores and mouth dryness.   Eyes:  Negative for dryness.  Respiratory:  Positive for shortness of breath.   Cardiovascular:  Positive for chest pain and palpitations.  Gastrointestinal:  Negative for blood in stool, constipation and diarrhea.  Endocrine: Positive for increased urination.  Genitourinary:  Negative for involuntary urination.  Musculoskeletal:  Positive for joint pain, gait problem, joint pain, joint swelling, myalgias, muscle weakness, morning stiffness, muscle tenderness and myalgias.  Skin:  Negative for color change, rash, hair loss and sensitivity to sunlight.  Allergic/Immunologic: Negative for susceptible to infections.  Neurological:  Positive for dizziness. Negative for headaches.  Hematological:  Negative for swollen glands.  Psychiatric/Behavioral:  Negative  for depressed mood and sleep disturbance. The patient is not nervous/anxious.     PMFS History:  Patient Active Problem List   Diagnosis Date Noted   Vitamin D deficiency 08/14/2022   High risk medication use 05/25/2022   Chronic systolic heart failure (HCC) 11/04/2020   ICD (implantable cardioverter-defibrillator) in place    Preoperative examination    Cardiac sarcoidosis 10/21/2020   Ventricular tachycardia, non-sustained (HCC) 10/21/2020   AKI (acute kidney injury) (HCC) 10/21/2020   Pleural effusion    Acute combined systolic and diastolic heart failure (HCC)    Sarcoidosis    Severe mitral regurgitation    Mediastinal lymphadenopathy 10/12/2020   Dyspnea 10/12/2020    Past Medical History:  Diagnosis Date   Allergies    09/16/2020   BPH (benign prostatic hyperplasia)    ED (erectile dysfunction)    GERD (gastroesophageal reflux disease)    Osteoarthritis    Peripheral focal chorioretinal inflammation of both eyes    Retinal vasculitis of both eyes    Sarcoidosis     Family History  Problem Relation Age of Onset   Stroke Mother    Chronic Renal Failure Mother    Dementia Mother    Cancer Father 55   Diabetes Father    Pancreatic cancer Father    Past Surgical History:  Procedure Laterality Date   BRONCHIAL NEEDLE ASPIRATION BIOPSY  10/18/2020   Procedure: BRONCHIAL NEEDLE ASPIRATION BIOPSIES;  Surgeon: Josephine Igo, DO;  Location: [facility];  Service: Pulmonary;;   BRONCHIAL WASHINGS  10/18/2020   Procedure: BRONCHIAL WASHINGS;  Surgeon:  Josephine Igo, DO;  Location: [facility];  Service: Pulmonary;;   ICD IMPLANT N/A 10/28/2020   Procedure: ICD IMPLANT;  Surgeon: Lanier Prude, MD;  Location: [facility] INVASIVE CV LAB;  Service: Cardiovascular;  Laterality: N/A;   RIGHT/LEFT HEART CATH AND CORONARY ANGIOGRAPHY N/A 10/16/2020   Procedure: RIGHT/LEFT HEART CATH AND CORONARY ANGIOGRAPHY;  Surgeon: Marykay Lex, MD;  Location: [facility] INVASIVE CV LAB;   Service: Cardiovascular;  Laterality: N/A;   VIDEO BRONCHOSCOPY WITH ENDOBRONCHIAL ULTRASOUND Bilateral 10/18/2020   Procedure: VIDEO BRONCHOSCOPY WITH ENDOBRONCHIAL ULTRASOUND;  Surgeon: Josephine Igo, DO;  Location: [facility];  Service: Pulmonary;  Laterality: Bilateral;   Social History   Social History Narrative   Not on file   Immunization History  Administered Date(s) Administered   Influenza,inj,Quad PF,6+ Mos 10/15/2020   PFIZER(Purple Top)SARS-COV-2 Vaccination 07/06/2019, 07/27/2019, 02/15/2020   Pfizer Covid-19 Vaccine Bivalent Booster 3yrs & up 10/21/2020     Objective: Vital Signs: BP 110/63 (BP Location: Right Arm, Patient Position: Sitting, Cuff Size: Normal)   Pulse 74   Resp 15   Ht 5\' 9"  (1.753 m)   Wt 226 lb (102.5 kg)   BMI 33.37 kg/m    Physical Exam HENT:     Nose: Nose normal.     Mouth/Throat:     Mouth: Mucous membranes are moist.     Pharynx: Oropharynx is clear.  Eyes:     Conjunctiva/sclera: Conjunctivae normal.  Cardiovascular:     Rate and Rhythm: Normal rate and regular rhythm.  Pulmonary:     Effort: Pulmonary effort is normal.     Breath sounds: Normal breath sounds.  Musculoskeletal:     Right lower leg: No edema.     Left lower leg: No edema.  Lymphadenopathy:     Cervical: No cervical adenopathy.  Skin:    General: Skin is warm and dry.     Findings: No rash.  Neurological:     Mental Status: He is alert.  Psychiatric:        Mood and Affect: Mood normal.      Musculoskeletal Exam:  Shoulders full ROM no tenderness or swelling Elbows full ROM no tenderness or swelling Wrists full ROM no tenderness or swelling Fingers full ROM right hand mild chronic appearing joint thickening at thumb and 2nd-3rd MCP joints, no palpable swelling Knees full ROM no tenderness or swelling Ankles full ROM no tenderness or swelling  Limited ultrasound exam of right hand negative for any joint effusion or hyperemia  Investigation: No  additional findings.  Imaging: CUP PACEART REMOTE DEVICE CHECK  Result Date: 08/03/2022 Scheduled remote reviewed. Normal device function.  Next remote 91 days. LA, CVRS  DG Hand Complete Right  Result Date: 07/31/2022 CLINICAL DATA:  Right hand pain for months. No known injury. Swelling near the third metacarpal. EXAM: RIGHT HAND - COMPLETE 3+ VIEW COMPARISON:  None Available. FINDINGS: There is mild-to-moderate third metacarpophalangeal joint space narrowing and metacarpal head subchondral cystic change with mild peripheral distal medial metacarpal head degenerative osteophytosis. Similar but more mild degenerative changes of second metacarpophalangeal joint. Mild-to-moderate thumb interphalangeal and second and third DIP joint space narrowing and peripheral osteophytosis. Mild index finger PIP joint space narrowing and peripheral osteophytosis. Moderate thumb carpometacarpal joint space narrowing, subchondral sclerosis, and peripheral osteophytosis. Tiny chronic ossicle at the lateral aspect of the thumb metacarpophalangeal joint. No acute fracture or dislocation. IMPRESSION: 1. Mild-to-moderate third metacarpophalangeal joint osteoarthritis. 2. Mild-to-moderate thumb interphalangeal and second and third DIP osteoarthritis.  3. Moderate thumb carpometacarpal osteoarthritis. Electronically Signed   By: Neita Garnet M.D.   On: 07/31/2022 22:45   US RENAL  Result Date: 07/29/2022 CLINICAL DATA:  CKD EXAM: RENAL / URINARY TRACT ULTRASOUND COMPLETE COMPARISON:  CT abdomen and pelvis without contrast February 28, 2014 FINDINGS: Right Kidney: Renal measurements: 10.3 x 4.5 x 4.1 cm = volume: 99 mL. Echogenicity within normal limits. No mass or hydronephrosis visualized. Left Kidney: Renal measurements: 9.8 x 4.3 x 4.8 cm = volume: 104.3 mL. Echogenicity within normal limits. No mass or hydronephrosis visualized. Bladder: Appears normal for degree of bladder distention. Other: None. IMPRESSION: Normal renal  ultrasound. Electronically Signed   By: Jacob Moores M.D.   On: 07/29/2022 17:00    Recent Labs: Lab Results  Component Value Date   WBC 10.4 07/20/2022   HGB 12.7 (L) 07/20/2022   PLT 151.0 07/20/2022   NA 140 07/20/2022   K 3.9 07/20/2022   CL 110 07/20/2022   CO2 22 07/20/2022   GLUCOSE 100 (H) 07/20/2022   BUN 23 07/20/2022   CREATININE 1.73 (H) 07/20/2022   BILITOT 0.3 07/20/2022   ALKPHOS 32 (L) 07/20/2022   AST 18 07/20/2022   ALT 15 07/20/2022   PROT 6.3 07/20/2022   ALBUMIN 3.7 07/20/2022   CALCIUM 8.7 07/20/2022   GFRAA >60 12/05/2015   QFTBGOLDPLUS Negative 05/19/2022    Speciality Comments: Avsola (infliximab) started 06/04/2022 - at [facility]  Procedures:  No procedures performed Allergies: Patient has no known allergies.   Assessment / Plan:     Visit Diagnoses: Sarcoidosis - Plan: C-reactive protein, VITAMIN D 25 Hydroxy (Vit-D Deficiency, Fractures), Vitamin D 1,25 dihydroxy  Ocular, Pulmonary, and cardiac involvement by symptoms and imaging studies to date.  I do not know if there is any definite articular disease x-rays were not demonstrative and no synovitis on exam today.  He had a normal ACE level and sedimentation rate checked recently.  Checking the mildly high previous CRP to see that this is normal.  Anticipate he will be able to try tapering steroids while continuing the infliximab.  We could recheck for any cutaneous or skeletal involvement again at that time.  Cardiac sarcoidosis  Currently pretty stable without significant peripheral edema no recent decompensation or exacerbation.  Still has considerable dyspnea with exertion limiting his activity.  Most recent echocardiogram with estimated ejection fraction in the 30s.  He was recently started on infliximab infusion which has been tolerating for 3 doses lab monitoring was appropriate so far.  No evidence of heart failure exacerbation associated with these.  Vitamin D  deficiency - Plan: C-reactive protein, VITAMIN D 25 Hydroxy (Vit-D Deficiency, Fractures), Vitamin D 1,25 dihydroxy  Suspected diagnosis he was previously on high-dose supplementation but completed this course.  Checking vitamin D also activated form in setting of active sarcoidosis.  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  Previously had a limited negative rheumatologic lab testing from 2 years ago reviewed on care everywhere.  Exam is negative for any synovitis or hyperemia today though he remains on 20 mg prednisone that may be masking low-grade joint inflammation.  Checking rheumatoid factor test today.  Discussed symptomatic management for osteoarthritis including supplemental options and topical NSAIDs.  Recommending against addition of daily maintenance oral NSAIDs while on the current steroid dosing may be a good option if he  tapers this off.  Orders: Orders Placed This Encounter  Procedures   C-reactive protein   VITAMIN D 25 Hydroxy (Vit-D Deficiency, Fractures)   Vitamin D 1,25 dihydroxy   Rheumatoid factor   No orders of the defined types were placed in this encounter.   Approximately 65 minutes were spent including detailed review of imaging and lab studies from multiple specialists, detailed history and physical examination, limited musculoskeletal ultrasound exam, patient counseling, and documentation.   Follow-Up Instructions: Return in 3 months (on 11/14/2022), or if symptoms worsen or fail to improve, for New pt sarcoidosis on MTX/INF/GC f/u 3mos.   Fuller Plan, MD  Note - This record has been created using [employer].  Chart creation errors have been sought, but may not always  have been located. Such creation errors do not reflect on  the standard of medical care.

## 2022-08-14 ENCOUNTER — Ambulatory Visit: Payer: 59 | Attending: Internal Medicine | Admitting: Internal Medicine

## 2022-08-14 ENCOUNTER — Encounter: Payer: Self-pay | Admitting: Internal Medicine

## 2022-08-14 VITALS — BP 110/63 | HR 74 | Resp 15 | Ht 69.0 in | Wt 226.0 lb

## 2022-08-14 DIAGNOSIS — D869 Sarcoidosis, unspecified: Secondary | ICD-10-CM

## 2022-08-14 DIAGNOSIS — Z79899 Other long term (current) drug therapy: Secondary | ICD-10-CM | POA: Diagnosis not present

## 2022-08-14 DIAGNOSIS — M79641 Pain in right hand: Secondary | ICD-10-CM

## 2022-08-14 DIAGNOSIS — D8685 Sarcoid myocarditis: Secondary | ICD-10-CM

## 2022-08-14 DIAGNOSIS — E559 Vitamin D deficiency, unspecified: Secondary | ICD-10-CM

## 2022-08-14 DIAGNOSIS — M79642 Pain in left hand: Secondary | ICD-10-CM

## 2022-08-14 NOTE — Patient Instructions (Signed)

## 2022-08-18 ENCOUNTER — Other Ambulatory Visit (HOSPITAL_COMMUNITY): Payer: Self-pay

## 2022-08-18 ENCOUNTER — Other Ambulatory Visit: Payer: Self-pay

## 2022-08-18 MED ORDER — TAMSULOSIN HCL 0.4 MG PO CAPS
0.4000 mg | ORAL_CAPSULE | Freq: Every day | ORAL | 0 refills | Status: DC
  Filled 2022-08-18: qty 90, 90d supply, fill #0
  Filled 2022-08-21: qty 30, 30d supply, fill #0
  Filled 2022-09-12: qty 30, 30d supply, fill #1
  Filled 2022-09-17: qty 60, 60d supply, fill #1

## 2022-08-19 ENCOUNTER — Other Ambulatory Visit (HOSPITAL_COMMUNITY): Payer: Self-pay

## 2022-08-19 ENCOUNTER — Ambulatory Visit (HOSPITAL_BASED_OUTPATIENT_CLINIC_OR_DEPARTMENT_OTHER): Payer: 59 | Admitting: Pulmonary Disease

## 2022-08-19 ENCOUNTER — Encounter (HOSPITAL_BASED_OUTPATIENT_CLINIC_OR_DEPARTMENT_OTHER): Payer: Self-pay | Admitting: Pulmonary Disease

## 2022-08-19 VITALS — BP 110/70 | HR 62 | Temp 98.8°F | Ht 68.0 in | Wt 231.8 lb

## 2022-08-19 DIAGNOSIS — D869 Sarcoidosis, unspecified: Secondary | ICD-10-CM

## 2022-08-19 DIAGNOSIS — Z79899 Other long term (current) drug therapy: Secondary | ICD-10-CM | POA: Diagnosis not present

## 2022-08-19 DIAGNOSIS — D8685 Sarcoid myocarditis: Secondary | ICD-10-CM | POA: Diagnosis not present

## 2022-08-19 LAB — VITAMIN D 1,25 DIHYDROXY
Vitamin D 1, 25 (OH)2 Total: 24 pg/mL (ref 18–72)
Vitamin D2 1, 25 (OH)2: <8 pg/mL
Vitamin D3 1, 25 (OH)2: 24 pg/mL

## 2022-08-19 LAB — C-REACTIVE PROTEIN: CRP: <3 mg/L (ref <8.0)

## 2022-08-19 LAB — RHEUMATOID FACTOR: Rheumatoid fact SerPl-aCnc: <10 IU/mL (ref <14)

## 2022-08-19 LAB — VITAMIN D 25 HYDROXY (VIT D DEFICIENCY, FRACTURES): Vit D, 25-Hydroxy: 32 ng/mL (ref 30–100)

## 2022-08-19 MED ORDER — METHOTREXATE SODIUM 2.5 MG PO TABS
20.0000 mg | ORAL_TABLET | ORAL | 1 refills | Status: DC
  Filled 2022-08-19: qty 32, 28d supply, fill #0
  Filled 2022-09-12: qty 32, 28d supply, fill #1

## 2022-08-19 NOTE — Patient Instructions (Addendum)
Sarcoidosis --Continue methotrexate 20 mg weekly   >Start taking prednisone 15 mg (3 pills) x 3 weeks. START 08/20/22  >Start taking prednisone 10 mg (2 pills) starting on 09/10/22 x 3 weeks. OK to stop Bactrim at this point  >Start taking prednisone 5 mg (1 pill) starting on 10/01/22. Continue this until our next visit --Recommend PPI daily when taking steroids  Will order echo for December 2024

## 2022-08-19 NOTE — Progress Notes (Signed)
Subjective:   PATIENT ID: Nathan [name] GENDER: male DOB: 06-Mar-1965, MRN: [id number]  Chief Complaint  Patient presents with   Follow-up    Pt states breathing has been doing well. No concerns     Reason for Visit: Follow-up  [name] is a 57 year old male former smoker with sarcoid with cardiac/pulmonary/ocular involvement, onchronic immunosuppressants, VT s/p ICD, chronic systolic heart failure, HTN, DM2 and osteoarthritis who presents for follow-up.   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 He was then admitted in 10/2020 for new heart failure and cMRI consistent with cardiac sarcoid. Bronchoscopy for mediastinal adenopathy was not diagnostic of sarcoid but was negative for malignancy. He was started on prednisone and methotrexate. Prednisone was discontinued in 03/2021 and he continued methotrexate 20 mg weekly. PET/CT sarcoid protocol 10/27/21 at [facility] demonstrated 5% cardiac involvement as well as diffuse pulmonary involvement. He was restarted on prednisone until he could be scheduled for pulmonary consultation.  He reports his symptoms are well controlled on prednisone and methotrexate. Though after restarting prednisone he is unsure if it has provided benefit and only taking it due to the abnormal PET/CT. Denies shortness of breath, cough, wheezing, chest pain, fatigue.  05/21/22 Since our last visit continues to have visual blurriness. Has not been contacted by Nephrology. Prefers to go to local Ophthalmologist and requesting referral. Compliant with prednisone and methotrexate. Denies any respiratory complaints  08/19/22 Since our last visit he has been started on infliximab 06/04/22. Has received total of 3 doses. Has tolerated it well with no complaints. Has been on methotrexate 20 mg weekly and prednisone 20 mg daily. Compliant  with bactrim and PPI. Denies shortness of breath, cough, wheezing, chest pain, lower extremity edema.  Social History: Quit smoking 2000s. Social smoker. Quit in 2012  Past Medical History:  Diagnosis Date   Allergies    09/16/2020   BPH (benign prostatic hyperplasia)    ED (erectile dysfunction)    GERD (gastroesophageal reflux disease)    Osteoarthritis    Peripheral focal chorioretinal inflammation of both eyes    Retinal vasculitis of both eyes    Sarcoidosis      Family History  Problem Relation Age of Onset   Stroke Mother    Chronic Renal Failure Mother    Dementia Mother    Cancer Father 62   Diabetes Father    Pancreatic cancer Father      Social History   Occupational History   Not on file  Tobacco Use   Smoking status: Former    Current packs/day: 0.00    Types: Cigarettes    Quit date: 02/02/2010    Years since quitting: 12.5    Passive exposure: Never   Smokeless tobacco: Never  Vaping Use   Vaping status: Never Used  Substance and Sexual Activity   Alcohol use: Yes    Alcohol/week: 1.0 standard drink of alcohol    Types: 1 Cans of beer per week   Drug use: Not Currently   Sexual activity: Not on file    No Known Allergies   Outpatient Medications Prior to Visit  Medication Sig Dispense Refill   amiodarone (PACERONE) 200 MG tablet Take 1 tablet (200 mg total) by mouth daily at 12 noon. Monday-Friday only 30 tablet 6   Blood Pressure Monitoring (OMRON 3 SERIES BP MONITOR) DEVI Use as directed  1 each 0   carvedilol (COREG) 3.125 MG tablet Take 1 tablet (3.125 mg total) by mouth 2 (two) times daily. 60 tablet 11   cetirizine (ZYRTEC) 10 MG tablet Take 10 mg by mouth as needed.     dapagliflozin propanediol (FARXIGA) 10 MG TABS tablet Take 1 tablet (10 mg total) by mouth daily. 90 tablet 3   folic acid (FOLVITE) 1 MG tablet Take by mouth.     furosemide (LASIX) 20 MG tablet Take 1 tablet (20 mg total) by mouth daily as needed. 30 tablet 3    inFLIXimab-axxq (AVSOLA IV) Inject into the vein. 3mg /kg at Week 0, 2, and 6, then 3mg /kg every 8 weeks thereafter     losartan (COZAAR) 25 MG tablet Take 1/2 tablet (12.5 mg total) by mouth daily. 15 tablet 3   Magnesium Oxide 250 MG TABS take 1 tablet by mouth every day as needed 90 tablet 0   metFORMIN (GLUCOPHAGE-XR) 500 MG 24 hr tablet Take 1 tablet (500 mg total) by mouth daily with evening meal 90 tablet 0   omeprazole (PRILOSEC) 40 MG capsule Take 1 capsule (40 mg total) by mouth every morning. 90 capsule 3   potassium chloride SA (KLOR-CON M) 20 MEQ tablet Take 1 tablet (20 mEq total) by mouth daily as needed. 30 tablet 3   predniSONE (DELTASONE) 5 MG tablet Take 4 tablets (20 mg total) by mouth daily with breakfast. 240 tablet 3   sildenafil (REVATIO) 20 MG tablet Take 2-3 tablets (40-60 mg total) by mouth as needed. 50 tablet 0   spironolactone (ALDACTONE) 25 MG tablet Take 1 tablet (25 mg total) by mouth daily. 90 tablet 3   sulfamethoxazole-trimethoprim (BACTRIM DS) 800-160 MG tablet Take 1 tablet by mouth 3 (three) times a week. Take 1 tablet every Monday, Wednesday and Friday while taking > 15 mg prednisone daily 24 tablet 3   tamsulosin (FLOMAX) 0.4 MG CAPS capsule Take 1 capsule (0.4 mg total) by mouth at bedtime. 90 capsule 0   methotrexate (RHEUMATREX) 2.5 MG tablet Take 8 tablets (20 mg total) by mouth as directed every Wednesday. Caution: Chemotherapy, protect from light. 32 tablet 1   tamsulosin (FLOMAX) 0.4 MG CAPS capsule Take 1 capsule (0.4 mg total) by mouth at bedtime. 90 capsule 0   No facility-administered medications prior to visit.    Review of Systems  Constitutional:  Negative for chills, diaphoresis, fever, malaise/fatigue and weight loss.  HENT:  Negative for congestion.   Respiratory:  Negative for cough, hemoptysis, sputum production, shortness of breath and wheezing.   Cardiovascular:  Negative for chest pain, palpitations and leg swelling.      Objective:   Vitals:   08/19/22 0928  BP: 110/70  Pulse: 62  Temp: 98.8 F (37.1 C)  TempSrc: Oral  SpO2: 98%  Weight: 231 lb 12.8 oz (105.1 kg)  Height: 5\' 8"  (1.727 m)   SpO2: 98 %  Physical Exam: General: Well-appearing, no acute distress HENT: [place], AT Eyes: EOMI, no scleral icterus Respiratory: Clear to auscultation bilaterally.  No crackles, wheezing or rales Cardiovascular: RRR, -M/R/G, no JVD Extremities:-Edema,-tenderness Neuro: AAO x4, CNII-XII grossly intact Psych: Normal mood, normal affect  Data Reviewed:  Imaging:  Cardiac PET/CT 10/27/21 metabolic study with F-18 FDG and scanned on PET [facility] MI:   1. There are few segments with hypermetabolic activity suggestive of an active inflammatory process in the left ventricular       myocardium.   2. Approximately 5% of the left  ventricle is involved with predominantly mild/moderate hypermetabolic activity.   3. There is no evidence of abnormal metabolism in the right ventricle.   4. In the appropriate clinical context with positive biomarkers, these findings may represent active cardiac sarcoidosis.    Perfusion/Function.    Gated cardiac PET/CT rest myocardial perfusion study with [id number] demonstrates:   1. Abnormal myocardial perfusion in a non-CAD pattern.   2. Severe dilated left ventricle with severe left ventricular systolic dysfunction.    Non-diagnostic limited  CT obtained for PET attenuation correction:   1. There are no discernible coronary artery calcifications.   2. There are extensive areas of extracardiac hypermetabolic activity noted on the limited field of view, specifically "bulky"        bilateral hilar and mediastinal lymph nodes. If clinically indicated/concern for systemic disease, a whole body PET/CT   PFT: 05/12/22 FVC 4.09 (83%) FEV1 3.50 (92%) Ratio 86  TLC 87% DLCO 77% Interpretation: Normal PFTs  Labs:    Latest Ref Rng & Units 07/20/2022   10:56 AM 06/22/2022   10:14 AM  04/14/2022    9:01 AM  CBC  WBC 4.0 - 10.5 K/uL 10.4  7.2  4.8   Hemoglobin 13.0 - 17.0 g/dL 95.6  21.3  08.6   Hematocrit 39.0 - 52.0 % 39.4  41.8  40.5   Platelets 150.0 - 400.0 K/uL 151.0  179.0  226.0       Latest Ref Rng & Units 07/20/2022   10:56 AM 06/22/2022   10:14 AM 04/14/2022    9:01 AM  BMP  Glucose 70 - 99 mg/dL 578  85  88   BUN 6 - 23 mg/dL 23  23  26    Creatinine 0.40 - 1.50 mg/dL 4.69  6.29  5.28   Sodium 135 - 145 mEq/L 140  140  138   Potassium 3.5 - 5.1 mEq/L 3.9  3.9  4.2   Chloride 96 - 112 mEq/L 110  106  105   CO2 19 - 32 mEq/L 22  27  24    Calcium 8.4 - 10.5 mg/dL 8.7  8.9  9.5       Latest Ref Rng & Units 07/20/2022   10:56 AM 06/22/2022   10:14 AM 04/14/2022    9:01 AM  Hepatic Function  Total Protein 6.0 - 8.3 g/dL 6.3  6.2  7.2   Albumin 3.5 - 5.2 g/dL 3.7  3.9  4.0   AST 0 - 37 U/L 18  15  15    ALT 0 - 53 U/L 15  14  14    Alk Phosphatase 39 - 117 U/L 32  38  74   Total Bilirubin 0.2 - 1.2 mg/dL 0.3  0.5  0.4    Bronchoscopy 10/18/20 [facility] - no malignancy     Assessment & Plan:   Discussion:  57 year old male former smoker with sarcoid with cardiac/pulmonary/ocular involvement, onchronic immunosuppressants, VT s/p ICD, chronic systolic heart failure, HTN and DM2 who presents for follow-up. On infliximab, methotrexate and prednisone. Reviewed labs. Stable. Will start titrating prednisone off as note below. Will plan for repeat echo in December 2024  Pulmonary sarcoidosis with cardiac and pulmonary, hx ocular --Dx in 10/2020 via cardiac MRI --PET/CT 10/2021 - Active cardiac sarcoid with pulmonary involvement  Cardiac sarcoid with NSVT Chronic systolic heart failure EF 30-35% Mitral regurgitation --Followed by Cardiology. Referred 05/2022 for multi-organ involvement --MR Cardiac 10/17/20 - Severe LV dysfunction with LGE pattern, bilateral pleural effusions --  S/p ICD 10/28/20 --Echo 02/07/21 EF 30-35% --Amio and HF management per  Cardiology --Repeat echo in December 2024 (6 months after infliximab)  Peripheral focal chorioretinal inflammation of both eyes --[facility] in [facility] --Last seen 02/07/19 with Dr. Sherryll Burger --Scheduled for 10/26/22 Ophthalmologist in the [place] area  History of immunosuppression High risk medication management --Methotrexate 02/2018-2021 for ocular sarcoid --Methotrexate 10/2020>01/2022 --Will plan to start infliximab if you are a candidate. Quantiferon pending --Continue methotrexate 20 mg weekly  --Prednisone 20 mg daily 04/28/22>08/19/22  >Start taking prednisone 15 mg (3 pills) x 3 weeks.   >Start taking prednisone 10 mg (2 pills) starting on 09/10/22 x 3 weeks. OK to stop Bactrim at this point  >Start taking prednisone 5 mg (1 pill) starting on 10/01/22. Continue this until our next visit --On PJP ppx MWF --Recommend PPI daily when taking steroids --Labs at next infusion clinic. Reviewed 07/2022 labs - stable  Sarcoid Monitoring --Recent chest imaging reviewed.  --Annual PFTs. Normal on 05/21/22. --Annual ophthalmology exam.  Last visit on 2023 per patient. Per EMR 02/07/19 with Dr. Sherryll Burger and has previously been lost to follow-up.  --Recent EKG and TTE reviewed as above --Routine labs as needed: CBC with diff, CMET, 1, 25 and 25 hydroxy vitamin D, urinary calcium  CKD IIIB --Established with Nephrology with Dr. Malen Gauze  Health Maintenance Immunization History  Administered Date(s) Administered   Influenza,inj,Quad PF,6+ Mos 10/15/2020   PFIZER(Purple Top)SARS-COV-2 Vaccination 07/06/2019, 07/27/2019, 02/15/2020   Pfizer Covid-19 Vaccine Bivalent Booster 64yrs & up 10/21/2020   CT Lung Screen - not qualified. Insufficient tobacco history  Orders Placed This Encounter  Procedures   ECHOCARDIOGRAM COMPLETE    Schedule in 01/2023    Standing Status:   Future    Standing Expiration Date:   08/19/2023    Scheduling Instructions:     Schedule in 01/2023    Order  Specific Question:   Where should this test be performed    Answer:   [place]    Order Specific Question:   Perflutren DEFINITY (image enhancing agent) should be administered unless hypersensitivity or allergy exist    Answer:   Administer Perflutren    Order Specific Question:   Is a special reader required? (athlete or structural heart)    Answer:   No    Order Specific Question:   Does this study need to be read by the Structural team/Level 3 readers?    Answer:   No    Order Specific Question:   Reason for exam-Echo    Answer:   Cardiac Sarcoidosis D86.85   Meds ordered this encounter  Medications   methotrexate (RHEUMATREX) 2.5 MG tablet    Sig: Take 8 tablets (20 mg total) by mouth as directed every Wednesday. Caution: Chemotherapy, protect from light.    Dispense:  32 tablet    Refill:  1    Return for early September.  I have spent a total time of 45-minutes on the day of the appointment including chart review, data review, collecting history, coordinating care and discussing medical diagnosis and plan with the patient/family. Past medical history, allergies, medications were reviewed. Pertinent imaging, labs and tests included in this note have been reviewed and interpreted independently by me.  Lamonta Cypress Mechele Collin, MD [facility] 08/19/2022 10:02 AM  Office Number [phone]

## 2022-08-20 NOTE — Progress Notes (Signed)
Remote ICD transmission.   

## 2022-08-21 ENCOUNTER — Encounter: Payer: Self-pay | Admitting: Pulmonary Disease

## 2022-08-21 ENCOUNTER — Other Ambulatory Visit (HOSPITAL_COMMUNITY): Payer: Self-pay

## 2022-09-01 ENCOUNTER — Other Ambulatory Visit (HOSPITAL_COMMUNITY): Payer: Self-pay

## 2022-09-01 DIAGNOSIS — R52 Pain, unspecified: Secondary | ICD-10-CM | POA: Insufficient documentation

## 2022-09-01 DIAGNOSIS — M1990 Unspecified osteoarthritis, unspecified site: Secondary | ICD-10-CM | POA: Insufficient documentation

## 2022-09-01 DIAGNOSIS — M19041 Primary osteoarthritis, right hand: Secondary | ICD-10-CM | POA: Diagnosis not present

## 2022-09-01 DIAGNOSIS — M1710 Unilateral primary osteoarthritis, unspecified knee: Secondary | ICD-10-CM | POA: Diagnosis not present

## 2022-09-01 MED ORDER — DICLOFENAC SODIUM 1 % EX GEL
2.0000 g | Freq: Four times a day (QID) | CUTANEOUS | 1 refills | Status: AC | PRN
  Filled 2022-09-01: qty 200, 15d supply, fill #0

## 2022-09-03 ENCOUNTER — Encounter: Payer: Self-pay | Admitting: Cardiology

## 2022-09-03 ENCOUNTER — Ambulatory Visit: Payer: 59 | Attending: Cardiology | Admitting: Cardiology

## 2022-09-03 VITALS — BP 104/62 | HR 75 | Ht 68.0 in | Wt 223.0 lb

## 2022-09-03 DIAGNOSIS — I4729 Other ventricular tachycardia: Secondary | ICD-10-CM | POA: Diagnosis not present

## 2022-09-03 DIAGNOSIS — D869 Sarcoidosis, unspecified: Secondary | ICD-10-CM

## 2022-09-03 DIAGNOSIS — I5022 Chronic systolic (congestive) heart failure: Secondary | ICD-10-CM | POA: Diagnosis not present

## 2022-09-03 DIAGNOSIS — Z79899 Other long term (current) drug therapy: Secondary | ICD-10-CM | POA: Diagnosis not present

## 2022-09-03 DIAGNOSIS — Z95810 Presence of automatic (implantable) cardiac defibrillator: Secondary | ICD-10-CM | POA: Diagnosis not present

## 2022-09-03 LAB — COMPREHENSIVE METABOLIC PANEL
ALT: 14 IU/L (ref 0–44)
AST: 15 IU/L (ref 0–40)
Albumin: 3.9 g/dL (ref 3.8–4.9)
Alkaline Phosphatase: 37 IU/L — ABNORMAL LOW (ref 44–121)
BUN/Creatinine Ratio: 15 (ref 9–20)
BUN: 23 mg/dL (ref 6–24)
Bilirubin Total: 0.5 mg/dL (ref 0.0–1.2)
CO2: 22 mmol/L (ref 20–29)
Calcium: 9.1 mg/dL (ref 8.7–10.2)
Chloride: 103 mmol/L (ref 96–106)
Creatinine, Ser: 1.57 mg/dL — ABNORMAL HIGH (ref 0.76–1.27)
Globulin, Total: 2 g/dL (ref 1.5–4.5)
Glucose: 75 mg/dL (ref 70–99)
Potassium: 4 mmol/L (ref 3.5–5.2)
Sodium: 137 mmol/L (ref 134–144)
Total Protein: 5.9 g/dL — ABNORMAL LOW (ref 6.0–8.5)
eGFR: 52 mL/min/{1.73_m2} — ABNORMAL LOW (ref >59)

## 2022-09-03 LAB — TSH: TSH: 0.562 u[IU]/mL (ref 0.450–4.500)

## 2022-09-03 LAB — MAGNESIUM: Magnesium: 2.2 mg/dL (ref 1.6–2.3)

## 2022-09-03 LAB — T4, FREE: Free T4: 1.53 ng/dL (ref 0.82–1.77)

## 2022-09-03 NOTE — Patient Instructions (Signed)
Medication Instructions:  Your physician recommends that you continue on your current medications as directed. Please refer to the Current Medication list given to you today.  *If you need a refill on your cardiac medications before your next appointment, please call your pharmacy*  Lab Work: TODAY: CMET, TSH, T4, Mag If you have labs (blood work) drawn today and your tests are completely normal, you will receive your results only by: MyChart Message (if you have MyChart) OR A paper copy in the mail If you have any lab test that is abnormal or we need to change your treatment, we will call you to review the results.   Follow-Up: At [facility], you and your health needs are our priority.  As part of our continuing mission to provide you with exceptional heart care, we have created designated Provider Care Teams.  These Care Teams include your primary Cardiologist (physician) and Advanced Practice Providers (APPs -  Physician Assistants and Nurse Practitioners) who all work together to provide you with the care you need, when you need it.  Your next appointment:   6 month(s)  Provider:   You will see one of the following Advanced Practice Providers on your designated Care Team:   Francis Dowse, Charlott Holler [address]" [place], [place] Sherie Don, NP Canary Brim, NP

## 2022-09-03 NOTE — Progress Notes (Signed)
  Electrophysiology Office Follow up Visit Note:    Date:  09/03/2022   ID:  ERJON [name], DOB 1965/12/16, MRN [id number]  PCP:  Dois Davenport, MD  [facility] Cardiologist:  Chilton Si, MD  [facility] Electrophysiologist:  Lanier Prude, MD    Interval History:    Nathan [name] is a 57 y.o. male who presents for a follow up visit.   Last seen March 10, 2022 by Mardelle Matte.  The patient has a Biotronik ICD implanted in 2022 for heart failure and sarcoidosis.  He has a history of VT and is maintained on amiodarone Monday through Friday.         Past medical, surgical, social and family history were reviewed.  ROS:   Please see the history of present illness.    All other systems reviewed and are negative.  EKGs/Labs/Other Studies Reviewed:    The following studies were reviewed today:  Lab work in February 2024 reviewed and shows stable liver and thyroid function  September 03, 2022 in clinic device interrogation personally reviewed Battery longevity 100% Lead parameter stable No high-voltage therapies delivered 0% A-fib      Physical Exam:    VS:  There were no vitals taken for this visit.    Wt Readings from Last 3 Encounters:  08/19/22 231 lb 12.8 oz (105.1 kg)  08/14/22 226 lb (102.5 kg)  07/20/22 229 lb 3.2 oz (104 kg)     GEN:  Well nourished, well developed in no acute distress CARDIAC: RRR, no murmurs, rubs, gallops.  ICD pocket well-healed RESPIRATORY:  Clear to auscultation without rales, wheezing or rhonchi       ASSESSMENT:    1. Sarcoidosis   2. Ventricular tachycardia, non-sustained (HCC)   3. Chronic systolic congestive heart failure (HCC)   4. Implantable cardioverter-defibrillator (ICD) in situ   5. Encounter for long-term (current) use of high-risk medication    PLAN:    In order of problems listed above:  #Chronic systolic heart failure #Cardiac sarcoidosis #Ventricular tachycardia #ICD in situ #High risk drug  monitoring-amiodarone The patient is doing well.  NYHA class II symptoms.  He is warm and dry on exam. Continue the Coreg, Lasix, losartan, spironolactone.  ICD is functioning appropriately.  Continue remote monitoring.  On infliximab with rheumatology, transitioning off of steroids.  Repeat CMP, TSH and free T4 today.  Follow-up 6 months with APP.      Signed, Steffanie Dunn, MD, [facility] 09/03/2022 5:06 AM    Electrophysiology [facility]

## 2022-09-04 ENCOUNTER — Other Ambulatory Visit (HOSPITAL_COMMUNITY): Payer: Self-pay

## 2022-09-12 ENCOUNTER — Other Ambulatory Visit (HOSPITAL_COMMUNITY): Payer: Self-pay | Admitting: Family Medicine

## 2022-09-14 ENCOUNTER — Other Ambulatory Visit (HOSPITAL_COMMUNITY): Payer: Self-pay

## 2022-09-14 ENCOUNTER — Ambulatory Visit (INDEPENDENT_AMBULATORY_CARE_PROVIDER_SITE_OTHER): Payer: 59

## 2022-09-14 ENCOUNTER — Other Ambulatory Visit: Payer: Self-pay

## 2022-09-14 VITALS — BP 109/67 | HR 75 | Temp 97.9°F | Resp 17 | Ht 68.0 in | Wt 224.2 lb

## 2022-09-14 DIAGNOSIS — D8685 Sarcoid myocarditis: Secondary | ICD-10-CM | POA: Diagnosis not present

## 2022-09-14 DIAGNOSIS — Z79899 Other long term (current) drug therapy: Secondary | ICD-10-CM | POA: Diagnosis not present

## 2022-09-14 DIAGNOSIS — D869 Sarcoidosis, unspecified: Secondary | ICD-10-CM

## 2022-09-14 MED ORDER — DIPHENHYDRAMINE HCL 25 MG PO CAPS
25.0000 mg | ORAL_CAPSULE | Freq: Once | ORAL | Status: AC
  Administered 2022-09-14: 25 mg via ORAL
  Filled 2022-09-14: qty 1

## 2022-09-14 MED ORDER — SODIUM CHLORIDE 0.9 % IV SOLN
3.0000 mg/kg | Freq: Once | INTRAVENOUS | Status: AC
  Administered 2022-09-14: 300 mg via INTRAVENOUS
  Filled 2022-09-14: qty 0

## 2022-09-14 MED ORDER — METHYLPREDNISOLONE SODIUM SUCC 40 MG IJ SOLR
40.0000 mg | Freq: Once | INTRAMUSCULAR | Status: AC
  Administered 2022-09-14: 40 mg via INTRAVENOUS
  Filled 2022-09-14: qty 1

## 2022-09-14 MED ORDER — ACETAMINOPHEN 325 MG PO TABS
650.0000 mg | ORAL_TABLET | Freq: Once | ORAL | Status: AC
  Administered 2022-09-14: 650 mg via ORAL
  Filled 2022-09-14: qty 2

## 2022-09-14 NOTE — Progress Notes (Signed)
Diagnosis:   Sarcoidosis    Provider:  Chilton Greathouse MD  Procedure: IV Infusion  IV Type: Peripheral, IV Location: R Antecubital  Avsola (infliximab-axxq), Dose: 300 mg  Infusion Start Time: 1113  Infusion Stop Time: 1334  Post Infusion IV Care: Peripheral IV Discontinued  Discharge: Condition: Good, Destination: Home . AVS Declined  Performed by:  Loney Hering, LPN

## 2022-09-15 ENCOUNTER — Other Ambulatory Visit (HOSPITAL_COMMUNITY): Payer: Self-pay

## 2022-09-15 MED ORDER — LOSARTAN POTASSIUM 25 MG PO TABS
12.5000 mg | ORAL_TABLET | Freq: Every day | ORAL | 0 refills | Status: DC
  Filled 2022-09-15: qty 45, 90d supply, fill #0

## 2022-09-17 ENCOUNTER — Other Ambulatory Visit (HOSPITAL_COMMUNITY): Payer: Self-pay

## 2022-09-17 MED ORDER — TAMSULOSIN HCL 0.4 MG PO CAPS
0.4000 mg | ORAL_CAPSULE | Freq: Every day | ORAL | 0 refills | Status: DC
  Filled 2022-09-17: qty 90, 90d supply, fill #0

## 2022-09-17 MED ORDER — TAMSULOSIN HCL 0.4 MG PO CAPS
0.4000 mg | ORAL_CAPSULE | Freq: Every day | ORAL | 0 refills | Status: DC
  Filled 2022-09-17 – 2022-10-13 (×2): qty 90, 90d supply, fill #0

## 2022-10-08 ENCOUNTER — Ambulatory Visit (HOSPITAL_BASED_OUTPATIENT_CLINIC_OR_DEPARTMENT_OTHER): Payer: 59 | Admitting: Pulmonary Disease

## 2022-10-12 DIAGNOSIS — N1832 Chronic kidney disease, stage 3b: Secondary | ICD-10-CM | POA: Diagnosis not present

## 2022-10-12 DIAGNOSIS — H35069 Retinal vasculitis, unspecified eye: Secondary | ICD-10-CM | POA: Diagnosis not present

## 2022-10-12 DIAGNOSIS — D8685 Sarcoid myocarditis: Secondary | ICD-10-CM | POA: Diagnosis not present

## 2022-10-12 DIAGNOSIS — N400 Enlarged prostate without lower urinary tract symptoms: Secondary | ICD-10-CM | POA: Diagnosis not present

## 2022-10-12 DIAGNOSIS — I5020 Unspecified systolic (congestive) heart failure: Secondary | ICD-10-CM | POA: Diagnosis not present

## 2022-10-13 ENCOUNTER — Other Ambulatory Visit (HOSPITAL_BASED_OUTPATIENT_CLINIC_OR_DEPARTMENT_OTHER): Payer: Self-pay | Admitting: Pulmonary Disease

## 2022-10-13 DIAGNOSIS — D869 Sarcoidosis, unspecified: Secondary | ICD-10-CM

## 2022-10-13 DIAGNOSIS — Z79899 Other long term (current) drug therapy: Secondary | ICD-10-CM

## 2022-10-13 LAB — LAB REPORT - SCANNED
Albumin, Urine POC: 3.9
Creatinine, POC: 125.9 mg/dL
EGFR: 47

## 2022-10-14 ENCOUNTER — Other Ambulatory Visit: Payer: Self-pay

## 2022-10-14 ENCOUNTER — Other Ambulatory Visit (HOSPITAL_COMMUNITY): Payer: Self-pay

## 2022-10-19 ENCOUNTER — Other Ambulatory Visit (HOSPITAL_COMMUNITY): Payer: Self-pay

## 2022-10-19 MED ORDER — METHOTREXATE SODIUM 2.5 MG PO TABS
20.0000 mg | ORAL_TABLET | ORAL | 0 refills | Status: DC
Start: 2022-10-19 — End: 2022-11-11
  Filled 2022-10-19: qty 32, 28d supply, fill #0

## 2022-10-19 NOTE — Telephone Encounter (Signed)
Refill for MTX sent to pharmacy. Patient due for updated CBC/CMP at f/u appt on 11/11/2022 and q 3 months. Future lab orders palced  Rx for MTX sent to pharmacy for 4 weeks  Chesley Mires, PharmD, MPH, BCPS, CPP Clinical Pharmacist (Rheumatology and Pulmonology)

## 2022-10-26 DIAGNOSIS — H5213 Myopia, bilateral: Secondary | ICD-10-CM | POA: Diagnosis not present

## 2022-10-26 DIAGNOSIS — H52203 Unspecified astigmatism, bilateral: Secondary | ICD-10-CM | POA: Diagnosis not present

## 2022-10-27 ENCOUNTER — Ambulatory Visit (INDEPENDENT_AMBULATORY_CARE_PROVIDER_SITE_OTHER): Payer: 59

## 2022-10-27 DIAGNOSIS — I4729 Other ventricular tachycardia: Secondary | ICD-10-CM | POA: Diagnosis not present

## 2022-10-27 DIAGNOSIS — I5022 Chronic systolic (congestive) heart failure: Secondary | ICD-10-CM

## 2022-10-30 LAB — CUP PACEART REMOTE DEVICE CHECK
Date Time Interrogation Session: 20240926073630
Implantable Lead Connection Status: 753985
Implantable Lead Implant Date: 20220926
Implantable Lead Location: 753860
Implantable Lead Model: 436910
Implantable Lead Serial Number: 81470915
Implantable Pulse Generator Implant Date: 20220926
Pulse Gen Model: 429525
Pulse Gen Serial Number: 84847461

## 2022-11-09 ENCOUNTER — Other Ambulatory Visit (INDEPENDENT_AMBULATORY_CARE_PROVIDER_SITE_OTHER): Payer: 59

## 2022-11-09 ENCOUNTER — Ambulatory Visit: Payer: 59

## 2022-11-09 VITALS — BP 103/68 | HR 65 | Temp 98.0°F | Resp 18 | Ht 68.0 in | Wt 228.2 lb

## 2022-11-09 DIAGNOSIS — D8685 Sarcoid myocarditis: Secondary | ICD-10-CM

## 2022-11-09 DIAGNOSIS — D869 Sarcoidosis, unspecified: Secondary | ICD-10-CM

## 2022-11-09 DIAGNOSIS — Z79899 Other long term (current) drug therapy: Secondary | ICD-10-CM

## 2022-11-09 LAB — CBC WITH DIFFERENTIAL/PLATELET
Basophils Absolute: 0 10*3/uL (ref 0.0–0.1)
Basophils Relative: 0.3 % (ref 0.0–3.0)
Eosinophils Absolute: 0.1 10*3/uL (ref 0.0–0.7)
Eosinophils Relative: 1.8 % (ref 0.0–5.0)
HCT: 42.5 % (ref 39.0–52.0)
Hemoglobin: 13.2 g/dL (ref 13.0–17.0)
Lymphocytes Relative: 19.4 % (ref 12.0–46.0)
Lymphs Abs: 1.3 10*3/uL (ref 0.7–4.0)
MCHC: 31.1 g/dL (ref 30.0–36.0)
MCV: 94.3 fL (ref 78.0–100.0)
Monocytes Absolute: 0.6 10*3/uL (ref 0.1–1.0)
Monocytes Relative: 9.6 % (ref 3.0–12.0)
Neutro Abs: 4.5 10*3/uL (ref 1.4–7.7)
Neutrophils Relative %: 68.9 % (ref 43.0–77.0)
Platelets: 198 10*3/uL (ref 150.0–400.0)
RBC: 4.51 Mil/uL (ref 4.22–5.81)
RDW: 17.2 % — ABNORMAL HIGH (ref 11.5–15.5)
WBC: 6.5 10*3/uL (ref 4.0–10.5)

## 2022-11-09 LAB — COMPREHENSIVE METABOLIC PANEL
ALT: 13 U/L (ref 0–53)
AST: 19 U/L (ref 0–37)
Albumin: 4.2 g/dL (ref 3.5–5.2)
Alkaline Phosphatase: 44 U/L (ref 39–117)
BUN: 15 mg/dL (ref 6–23)
CO2: 25 meq/L (ref 19–32)
Calcium: 9.3 mg/dL (ref 8.4–10.5)
Chloride: 108 meq/L (ref 96–112)
Creatinine, Ser: 1.37 mg/dL (ref 0.40–1.50)
GFR: 58.08 mL/min — ABNORMAL LOW (ref >60.00)
Glucose, Bld: 86 mg/dL (ref 70–99)
Potassium: 3.9 meq/L (ref 3.5–5.1)
Sodium: 140 meq/L (ref 135–145)
Total Bilirubin: 0.5 mg/dL (ref 0.2–1.2)
Total Protein: 6.6 g/dL (ref 6.0–8.3)

## 2022-11-09 MED ORDER — METHYLPREDNISOLONE SODIUM SUCC 40 MG IJ SOLR
40.0000 mg | Freq: Once | INTRAMUSCULAR | Status: AC
  Administered 2022-11-09: 40 mg via INTRAVENOUS
  Filled 2022-11-09: qty 1

## 2022-11-09 MED ORDER — ACETAMINOPHEN 325 MG PO TABS
650.0000 mg | ORAL_TABLET | Freq: Once | ORAL | Status: AC
  Administered 2022-11-09: 650 mg via ORAL
  Filled 2022-11-09: qty 2

## 2022-11-09 MED ORDER — SODIUM CHLORIDE 0.9 % IV SOLN
3.0000 mg/kg | Freq: Once | INTRAVENOUS | Status: AC
  Administered 2022-11-09: 300 mg via INTRAVENOUS
  Filled 2022-11-09: qty 30

## 2022-11-09 MED ORDER — DIPHENHYDRAMINE HCL 25 MG PO CAPS
25.0000 mg | ORAL_CAPSULE | Freq: Once | ORAL | Status: AC
  Administered 2022-11-09: 25 mg via ORAL
  Filled 2022-11-09: qty 1

## 2022-11-09 NOTE — Progress Notes (Signed)
Diagnosis: Cardiac sarcoidosis   Provider:  Chilton Greathouse MD  Procedure: IV Infusion  IV Type: Peripheral, IV Location: R Antecubital  Avsola (infliximab-axxq), Dose: 300 mg  Infusion Start Time: 1110  Infusion Stop Time: 1318  Post Infusion IV Care: Peripheral IV Discontinued  Discharge: Condition: Good, Destination: Home . AVS Declined  Performed by:  Rico Ala, LPN

## 2022-11-11 ENCOUNTER — Ambulatory Visit (HOSPITAL_BASED_OUTPATIENT_CLINIC_OR_DEPARTMENT_OTHER): Payer: 59 | Admitting: Pulmonary Disease

## 2022-11-11 ENCOUNTER — Other Ambulatory Visit (HOSPITAL_COMMUNITY): Payer: Self-pay

## 2022-11-11 ENCOUNTER — Encounter (HOSPITAL_BASED_OUTPATIENT_CLINIC_OR_DEPARTMENT_OTHER): Payer: Self-pay | Admitting: Pulmonary Disease

## 2022-11-11 VITALS — BP 112/72 | HR 61 | Resp 16 | Ht 68.0 in | Wt 231.0 lb

## 2022-11-11 DIAGNOSIS — Z23 Encounter for immunization: Secondary | ICD-10-CM

## 2022-11-11 DIAGNOSIS — D869 Sarcoidosis, unspecified: Secondary | ICD-10-CM

## 2022-11-11 MED ORDER — METHOTREXATE SODIUM 2.5 MG PO TABS
20.0000 mg | ORAL_TABLET | ORAL | 1 refills | Status: DC
Start: 2022-11-11 — End: 2023-05-13
  Filled 2022-11-11: qty 32, 28d supply, fill #0
  Filled 2022-12-12: qty 32, 28d supply, fill #1
  Filled 2023-01-10: qty 32, 28d supply, fill #2
  Filled 2023-02-05: qty 32, 28d supply, fill #3
  Filled 2023-03-05 – 2023-03-17 (×2): qty 32, 28d supply, fill #4
  Filled 2023-04-11: qty 32, 28d supply, fill #5

## 2022-11-11 NOTE — Progress Notes (Unsigned)
Subjective:   PATIENT ID: Nathan [name] GENDER: male DOB: 1965-07-19, MRN: [id number]  Chief Complaint  Patient presents with   Follow-up    Reason for Visit: Follow-up  Mr. Lamere Ahlert is a 57 year old male former smoker with sarcoid with cardiac/pulmonary/ocular involvement, onchronic immunosuppressants, VT s/p ICD, chronic systolic heart failure, HTN, DM2 and osteoarthritis who presents for follow-up.   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 He was then admitted in 10/2020 for new heart failure and cMRI consistent with cardiac sarcoid. Bronchoscopy for mediastinal adenopathy was not diagnostic of sarcoid but was negative for malignancy. He was started on prednisone and methotrexate. Prednisone was discontinued in 03/2021 and he continued methotrexate 20 mg weekly. PET/CT sarcoid protocol 10/27/21 at [facility] demonstrated 5% cardiac involvement as well as diffuse pulmonary involvement. He was restarted on prednisone until he could be scheduled for pulmonary consultation.  He reports his symptoms are well controlled on prednisone and methotrexate. Though after restarting prednisone he is unsure if it has provided benefit and only taking it due to the abnormal PET/CT. Denies shortness of breath, cough, wheezing, chest pain, fatigue.  05/21/22 Since our last visit continues to have visual blurriness. Has not been contacted by Nephrology. Prefers to go to local Ophthalmologist and requesting referral. Compliant with prednisone and methotrexate. Denies any respiratory complaints  08/19/22 Since our last visit he has been started on infliximab 06/04/22. Has received total of 3 doses. Has tolerated it well with no complaints. Has been on methotrexate 20 mg weekly and prednisone 20 mg daily. Compliant with bactrim and PPI. Denies shortness of breath, cough,  wheezing, chest pain, lower extremity edema.  11/11/22 Since our last visit he is tolerating infliximab and methotrexate. He is down to prednisone 5 mg daily.  Social History: Quit smoking 2000s. Social smoker. Quit in 2012 Cowboy boots/Shoe fan  Past Medical History:  Diagnosis Date   Allergies    09/16/2020   BPH (benign prostatic hyperplasia)    ED (erectile dysfunction)    GERD (gastroesophageal reflux disease)    Osteoarthritis    Peripheral focal chorioretinal inflammation of both eyes    Retinal vasculitis of both eyes    Sarcoidosis      Family History  Problem Relation Age of Onset   Stroke Mother    Chronic Renal Failure Mother    Dementia Mother    Cancer Father 50   Diabetes Father    Pancreatic cancer Father      Social History   Occupational History   Not on file  Tobacco Use   Smoking status: Former    Current packs/day: 0.00    Types: Cigarettes    Quit date: 02/02/2010    Years since quitting: 12.7    Passive exposure: Never   Smokeless tobacco: Never  Vaping Use   Vaping status: Never Used  Substance and Sexual Activity   Alcohol use: Yes    Alcohol/week: 1.0 standard drink of alcohol    Types: 1 Cans of beer per week   Drug use: Not Currently   Sexual activity: Not on file    No Known Allergies   Outpatient Medications Prior to Visit  Medication Sig Dispense Refill   amiodarone (PACERONE) 200 MG tablet Take 1 tablet (200 mg total) by mouth daily at 12 noon. Monday-Friday only 30 tablet 6   Blood Pressure  Monitoring (OMRON 3 SERIES BP MONITOR) DEVI Use as directed 1 each 0   carvedilol (COREG) 3.125 MG tablet Take 1 tablet (3.125 mg total) by mouth 2 (two) times daily. 60 tablet 11   cetirizine (ZYRTEC) 10 MG tablet Take 10 mg by mouth as needed.     dapagliflozin propanediol (FARXIGA) 10 MG TABS tablet Take 1 tablet (10 mg total) by mouth daily. 90 tablet 3   diclofenac Sodium (VOLTAREN) 1 % GEL Apply 2 g topically 4 (four) times daily as  needed. 200 g 1   folic acid (FOLVITE) 1 MG tablet Take by mouth.     furosemide (LASIX) 20 MG tablet Take 1 tablet (20 mg total) by mouth daily as needed. 30 tablet 3   inFLIXimab-axxq (AVSOLA IV) Inject into the vein. 3mg /kg at Week 0, 2, and 6, then 3mg /kg every 8 weeks thereafter     losartan (COZAAR) 25 MG tablet Take 1/2 tablet (12.5 mg total) by mouth daily. NEEDS FOLLOW UP APPOINTMENT FOR MORE REFILLS 45 tablet 0   Magnesium Oxide 250 MG TABS take 1 tablet by mouth every day as needed 90 tablet 0   metFORMIN (GLUCOPHAGE-XR) 500 MG 24 hr tablet Take 1 tablet (500 mg total) by mouth daily with evening meal 90 tablet 0   methotrexate (RHEUMATREX) 2.5 MG tablet Take 8 tablets (20 mg total) by mouth as directed every Wednesday. Caution: Chemotherapy, protect from light. 32 tablet 0   omeprazole (PRILOSEC) 40 MG capsule Take 1 capsule (40 mg total) by mouth every morning. 90 capsule 3   predniSONE (DELTASONE) 5 MG tablet Take 4 tablets (20 mg total) by mouth daily with breakfast. (Patient taking differently: Take 5 mg by mouth daily with breakfast.) 240 tablet 3   sildenafil (REVATIO) 20 MG tablet Take 2-3 tablets (40-60 mg total) by mouth as needed. 50 tablet 0   spironolactone (ALDACTONE) 25 MG tablet Take 1 tablet (25 mg total) by mouth daily. 90 tablet 3   tamsulosin (FLOMAX) 0.4 MG CAPS capsule Take 1 capsule (0.4 mg total) by mouth at bedtime. 90 capsule 0   tamsulosin (FLOMAX) 0.4 MG CAPS capsule Take 1 capsule (0.4 mg total) by mouth at bedtime. 90 capsule 0   potassium chloride SA (KLOR-CON M) 20 MEQ tablet Take 1 tablet (20 mEq total) by mouth daily as needed. 30 tablet 3   sulfamethoxazole-trimethoprim (BACTRIM DS) 800-160 MG tablet Take 1 tablet by mouth 3 (three) times a week. Take 1 tablet every Monday, Wednesday and Friday while taking > 15 mg prednisone daily 24 tablet 3   No facility-administered medications prior to visit.    Review of Systems  Constitutional:  Negative for  chills, diaphoresis, fever, malaise/fatigue and weight loss.  HENT:  Negative for congestion.   Respiratory:  Negative for cough, hemoptysis, sputum production, shortness of breath and wheezing.   Cardiovascular:  Negative for chest pain, palpitations and leg swelling.     Objective:   Vitals:   11/11/22 1132  BP: 112/72  Pulse: 61  Resp: 16  SpO2: 96%  Weight: 231 lb (104.8 kg)  Height: 5\' 8"  (1.727 m)   SpO2: 96 %  Physical Exam: General: Well-appearing, no acute distress HENT: [place], AT Eyes: EOMI, no scleral icterus Respiratory: Clear to auscultation bilaterally.  No crackles, wheezing or rales Cardiovascular: RRR, -M/R/G, no JVD Extremities:-Edema,-tenderness Neuro: AAO x4, CNII-XII grossly intact Psych: Normal mood, normal affect   Data Reviewed:  Imaging:  Cardiac PET/CT 10/27/21 metabolic study with F-18  FDG and scanned on PET [facility] MI:   1. There are few segments with hypermetabolic activity suggestive of an active inflammatory process in the left ventricular       myocardium.   2. Approximately 5% of the left ventricle is involved with predominantly mild/moderate hypermetabolic activity.   3. There is no evidence of abnormal metabolism in the right ventricle.   4. In the appropriate clinical context with positive biomarkers, these findings may represent active cardiac sarcoidosis.    Perfusion/Function.    Gated cardiac PET/CT rest myocardial perfusion study with [id number] demonstrates:   1. Abnormal myocardial perfusion in a non-CAD pattern.   2. Severe dilated left ventricle with severe left ventricular systolic dysfunction.    Non-diagnostic limited  CT obtained for PET attenuation correction:   1. There are no discernible coronary artery calcifications.   2. There are extensive areas of extracardiac hypermetabolic activity noted on the limited field of view, specifically "bulky"        bilateral hilar and mediastinal lymph nodes. If clinically  indicated/concern for systemic disease, a whole body PET/CT   PFT: 05/12/22 FVC 4.09 (83%) FEV1 3.50 (92%) Ratio 86  TLC 87% DLCO 77% Interpretation: Normal PFTs  Labs:    Latest Ref Rng & Units 11/09/2022   11:03 AM 07/20/2022   10:56 AM 06/22/2022   10:14 AM  CBC  WBC 4.0 - 10.5 K/uL 6.5  10.4  7.2   Hemoglobin 13.0 - 17.0 g/dL 40.3  47.4  25.9   Hematocrit 39.0 - 52.0 % 42.5  39.4  41.8   Platelets 150.0 - 400.0 K/uL 198.0  151.0  179.0       Latest Ref Rng & Units 11/09/2022   11:03 AM 09/03/2022    8:20 AM 07/20/2022   10:56 AM  BMP  Glucose 70 - 99 mg/dL 86  75  563   BUN 6 - 23 mg/dL 15  23  23    Creatinine 0.40 - 1.50 mg/dL 8.75  6.43  3.29   BUN/Creat Ratio 9 - 20  15    Sodium 135 - 145 mEq/L 140  137  140   Potassium 3.5 - 5.1 mEq/L 3.9  4.0  3.9   Chloride 96 - 112 mEq/L 108  103  110   CO2 19 - 32 mEq/L 25  22  22    Calcium 8.4 - 10.5 mg/dL 9.3  9.1  8.7       Latest Ref Rng & Units 11/09/2022   11:03 AM 09/03/2022    8:20 AM 07/20/2022   10:56 AM  Hepatic Function  Total Protein 6.0 - 8.3 g/dL 6.6  5.9  6.3   Albumin 3.5 - 5.2 g/dL 4.2  3.9  3.7   AST 0 - 37 U/L 19  15  18    ALT 0 - 53 U/L 13  14  15    Alk Phosphatase 39 - 117 U/L 44  37  32   Total Bilirubin 0.2 - 1.2 mg/dL 0.5  0.5  0.3    Bronchoscopy 10/18/20 [facility] - no malignancy     Assessment & Plan:   Discussion:  57 year old male former smoker with sarcoid with cardiac/pulmonary/ocular involvement, onchronic immunosuppressants, VT s/p ICD, chronic systolic heart failure, HTN and DM2 who presents for follow-up. On infliximab, methotrexate and prednisone. Reviewed labs. Stable. Will start titrating prednisone off as note below. Will plan for repeat echo in December 2024  Pulmonary sarcoidosis with cardiac and pulmonary,  hx ocular --Dx in 10/2020 via cardiac MRI --PET/CT 10/2021 - Active cardiac sarcoid with pulmonary involvement  Cardiac sarcoid with NSVT Chronic systolic heart failure EF  30-35% Mitral regurgitation --Followed by Cardiology. Referred 05/2022 for multi-organ involvement --MR Cardiac 10/17/20 - Severe LV dysfunction with LGE pattern, bilateral pleural effusions --S/p ICD 10/28/20 --Echo 02/07/21 EF 30-35% --Amio and HF management per Cardiology --Repeat echo in December 2024 (6 months after infliximab)  Peripheral focal chorioretinal inflammation of both eyes --Diagnosed in 2020 at [facility] --Last seen 02/07/19 with Dr. Sherryll Burger --Scheduled for 10/26/22 Ophthalmologist in the [place] area  History of immunosuppression High risk medication management --Methotrexate 02/2018-2021 for ocular sarcoid --Methotrexate 10/2020>01/2022 --Prednisone 20 mg daily 04/28/22>08/19/22 --Plan to taper off prednisone by end of 11/2022 --Continue infliximab every 8 weeks --Continue methotrexate 20 mg weekly --Taper prednisone 5 mg every other day for two weeks then STOP  Sarcoid Monitoring --Recent chest imaging reviewed.  --Annual PFTs. Normal on 05/21/22. --Annual ophthalmology exam.  Last visit on 2023 per patient. Per EMR 02/07/19 with Dr. Sherryll Burger and has previously been lost to follow-up.  --Recent EKG and TTE reviewed as above --Routine labs as needed: CBC with diff, CMET, 1, 25 and 25 hydroxy vitamin D, urinary calcium  CKD IIIB --Established with Nephrology with Dr. Malen Gauze  Health Maintenance Immunization History  Administered Date(s) Administered   Influenza, Seasonal, Injecte, Preservative Fre 11/11/2022   Influenza,inj,Quad PF,6+ Mos 10/15/2020   PFIZER(Purple Top)SARS-COV-2 Vaccination 07/06/2019, 07/27/2019, 02/15/2020   Pfizer Covid-19 Vaccine Bivalent Booster 77yrs & up 10/21/2020   CT Lung Screen - not qualified. Insufficient tobacco history  Orders Placed This Encounter  Procedures   Flu vaccine trivalent PF, 6mos and older(Flulaval,Afluria,Fluarix,Fluzone)   Meds ordered this encounter  Medications   methotrexate (RHEUMATREX) 2.5 MG tablet     Sig: Take 8 tablets (20 mg total) by mouth as directed every Wednesday. Caution: Chemotherapy, protect from light.    Dispense:  96 tablet    Refill:  1    Return for After echocardiogram. OK to schedule appointment in 3rd or 4 th week of December.  I have spent a total time of 35-minutes on the day of the appointment including chart review, data review, collecting history, coordinating care and discussing medical diagnosis and plan with the patient/family. Past medical history, allergies, medications were reviewed. Pertinent imaging, labs and tests included in this note have been reviewed and interpreted independently by me.  Gracilyn Gunia Mechele Collin, MD [facility] 11/11/2022 11:44 AM  Office Number [phone]

## 2022-11-11 NOTE — Patient Instructions (Addendum)
--  Continue infliximab every 8 weeks --Continue methotrexate 20 mg weekly --Taper prednisone 5 mg every other day for two weeks then STOP  Plan for echocardiogram December Please schedule follow-up with me in 3rd or 4th week of December

## 2022-11-12 ENCOUNTER — Other Ambulatory Visit (HOSPITAL_COMMUNITY): Payer: Self-pay

## 2022-11-12 NOTE — Progress Notes (Signed)
Remote ICD transmission.   

## 2022-11-16 ENCOUNTER — Encounter: Payer: Self-pay | Admitting: Internal Medicine

## 2022-11-16 ENCOUNTER — Ambulatory Visit: Payer: 59 | Attending: Internal Medicine | Admitting: Internal Medicine

## 2022-11-16 VITALS — BP 108/70 | HR 67 | Resp 12 | Ht 68.5 in | Wt 231.0 lb

## 2022-11-16 DIAGNOSIS — I5041 Acute combined systolic (congestive) and diastolic (congestive) heart failure: Secondary | ICD-10-CM | POA: Diagnosis not present

## 2022-11-16 DIAGNOSIS — D869 Sarcoidosis, unspecified: Secondary | ICD-10-CM | POA: Diagnosis not present

## 2022-11-16 DIAGNOSIS — S46219A Strain of muscle, fascia and tendon of other parts of biceps, unspecified arm, initial encounter: Secondary | ICD-10-CM | POA: Insufficient documentation

## 2022-11-16 DIAGNOSIS — D8685 Sarcoid myocarditis: Secondary | ICD-10-CM

## 2022-11-16 NOTE — Progress Notes (Signed)
Office Visit Note  Patient: Nathan [name]             Date of Birth: 11-19-1965           MRN: [id number]             PCP: Dois Davenport, MD Referring: Dois Davenport, MD Visit Date: 11/16/2022   Subjective:  Follow-up   History of Present Illness: Nathan [name] is a 57 y.o. male here for follow up for sarcoidosis with cardiac, pulmonary, retinal, and bulky lymph node inflammation on methotrexate 20 mg p.o. weekly and Avsola IV every 8 weeks.  Since the last visit he has proceeded tapering off the remaining prednisone completely.  Has not noticed any particular exacerbation of symptoms still has stiffness in multiple areas.  Has not noticed any new joint swelling rashes or other obvious symptoms.  Biggest joint complaint today is with the shoulders. Has some pain and stiffness but particularly right shoulder suffered injury about 8 months ago lifting a music case felt a popping sensation with subsequent lump or swelling just proximal to the elbow.  This has not been particularly painful or limiting his strength or activity.  He never saw any specialist for evaluation of the injury.  Previous HPI 08/14/22 Nathan [name] is a 56 y.o. male here for evaluation of sarcoidosis also with complaint of bilateral but mostly right-sided hand pain.  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  He was started on treatment with methotrexate and steroid eyedrops at that time.  Had some additional autoimmune workup that was negative for evidence of rheumatoid arthritis or systemic inflammation at the time. Was on maintenance treatment until he stopped due to losing his job in 2021. Continued off any specific maintenance treatment without severe exacerbation of symptoms until in 2022.  Where he started developing initially some mild cough and orthopnea but then progressed to more severe lower  extremity swelling and dyspnea on exertion.  Outpatient clinic exam with bulky lymphadenopathy large concerning pulmonary nodules.  Was hospitalized in September 2022 had bronchoscopy with biopsies negative for malignancy.  Cardiac workup at the time with ejection fraction of 25% and cardiac MRI consistent with 18% and a late gadolinium enhancement pattern consistent with cardiac sarcoidosis.  He was started on prednisone and methotrexate and discharged with LifeVest due to frequent PVCs.  He was readmitted later that month after multiple alarms and had ICD placement. He tapered off the prednisone on just methotrexate maintenance at 20 mg p.o. weekly.  Subsequent improvement in left ventricular ejection fraction up to 30 to 35%.  Went for PET scan in September last year this demonstrated ongoing extracardiac sarcoidosis activity with some cardiac disease involving only 5% of the left ventricular myocardium.  Did have return of significant symptoms positional and with exertion combined with his findings was restarted on prednisone since March.  Was also recommended to follow-up again with Dr. Dierdre Forth his previous rheumatologist unable to do so at the time due to outstanding office bill.  Currently maintained on prednisone 20 mg daily.  Also saw Dr. Everardo All and started on Avsola infusion completed initial 3 loading doses so far. He experiences joint pain in both hands more severe on the right particularly around the MCP joints.  He has noticed a little bit in the past but more symptomatic for the past 3 months.  Had x-rays checked from his primary  care office consistent with osteoarthritis.  Never had any specific injuries or surgery to the affected area.  He thinks there is some swelling as the knuckles look bigger than usual and often feels stiff and tight.  Has not noticed a very big difference since resuming the prednisone.  Takes Tylenol and uses topical Voltaren intermittently these are partially helpful. He  does not complain of any skin rashes.   Imaging reviewed 07/29/22 Xray right hand 3 views IMPRESSION: 1. Mild-to-moderate third metacarpophalangeal joint osteoarthritis. 2. Mild-to-moderate thumb interphalangeal and second and third DIP osteoarthritis. 3. Moderate thumb carpometacarpal osteoarthritis.   10/27/21 PET/CT FINAL COMMENTS  Metabolism.   Cardiac PET/CT metabolic study with F-18 FDG and scanned on PET Discovery MI:   1. There are few segments with hypermetabolic activity suggestive of an active inflammatory process in the left ventricular myocardium.   2. Approximately 5% of the left ventricle is involved with predominantly mild/moderate hypermetabolic activity.   3. There is no evidence of abnormal metabolism in the right ventricle.   4. In the appropriate clinical context with positive biomarkers, these findings may represent active cardiac sarcoidosis.   Perfusion/Function.   Gated cardiac PET/CT rest myocardial perfusion study with [id number] demonstrates:   1. Abnormal myocardial perfusion in a non-CAD pattern.   2. Severe dilated left ventricle with severe left ventricular systolic dysfunction.   Non-diagnostic limited  CT obtained for PET attenuation correction:   1. There are no discernible coronary artery calcifications.   2. There are extensive areas of extracardiac hypermetabolic activity noted on the limited field of view, specifically "bulky" bilateral hilar and mediastinal lymph nodes. If clinically indicated/concern for systemic disease, a whole body PET/CT may be obtained for further evaluation.    Review of Systems  Constitutional:  Negative for fatigue.  HENT:  Positive for mouth sores. Negative for mouth dryness.   Eyes:  Negative for dryness.  Respiratory:  Negative for shortness of breath.   Cardiovascular:  Negative for chest pain and palpitations.  Gastrointestinal:  Negative for blood in stool, constipation and diarrhea.  Endocrine: Negative for increased  urination.  Genitourinary:  Negative for involuntary urination.  Musculoskeletal:  Positive for joint pain, gait problem, joint pain, joint swelling and morning stiffness. Negative for myalgias, muscle weakness, muscle tenderness and myalgias.  Skin:  Negative for color change, rash, hair loss and sensitivity to sunlight.  Allergic/Immunologic: Negative for susceptible to infections.  Neurological:  Positive for dizziness. Negative for headaches.  Hematological:  Negative for swollen glands.  Psychiatric/Behavioral:  Positive for depressed mood. Negative for sleep disturbance. The patient is not nervous/anxious.     PMFS History:  Patient Active Problem List   Diagnosis Date Noted   Biceps tendon tear 11/16/2022   Vitamin D deficiency 08/14/2022   High risk medication use 05/25/2022   Chronic systolic heart failure (HCC) 11/04/2020   ICD (implantable cardioverter-defibrillator) in place    Preoperative examination    Cardiac sarcoidosis 10/21/2020   Ventricular tachycardia, non-sustained (HCC) 10/21/2020   AKI (acute kidney injury) (HCC) 10/21/2020   Pleural effusion    Acute combined systolic and diastolic heart failure (HCC)    Sarcoidosis    Severe mitral regurgitation    Mediastinal lymphadenopathy 10/12/2020   Dyspnea 10/12/2020    Past Medical History:  Diagnosis Date   Allergies    09/16/2020   BPH (benign prostatic hyperplasia)    ED (erectile dysfunction)    GERD (gastroesophageal reflux disease)    Osteoarthritis    Peripheral  focal chorioretinal inflammation of both eyes    Retinal vasculitis of both eyes    Sarcoidosis     Family History  Problem Relation Age of Onset   Stroke Mother    Chronic Renal Failure Mother    Dementia Mother    Cancer Father 32   Diabetes Father    Pancreatic cancer Father    Past Surgical History:  Procedure Laterality Date   BRONCHIAL NEEDLE ASPIRATION BIOPSY  10/18/2020   Procedure: BRONCHIAL NEEDLE ASPIRATION BIOPSIES;   Surgeon: Josephine Igo, DO;  Location: [facility];  Service: Pulmonary;;   BRONCHIAL WASHINGS  10/18/2020   Procedure: BRONCHIAL WASHINGS;  Surgeon: Josephine Igo, DO;  Location: [facility];  Service: Pulmonary;;   ICD IMPLANT N/A 10/28/2020   Procedure: ICD IMPLANT;  Surgeon: Lanier Prude, MD;  Location: [facility];  Service: Cardiovascular;  Laterality: N/A;   RIGHT/LEFT HEART CATH AND CORONARY ANGIOGRAPHY N/A 10/16/2020   Procedure: RIGHT/LEFT HEART CATH AND CORONARY ANGIOGRAPHY;  Surgeon: Marykay Lex, MD;  Location: [facility] INVASIVE CV LAB;  Service: Cardiovascular;  Laterality: N/A;   VIDEO BRONCHOSCOPY WITH ENDOBRONCHIAL ULTRASOUND Bilateral 10/18/2020   Procedure: VIDEO BRONCHOSCOPY WITH ENDOBRONCHIAL ULTRASOUND;  Surgeon: Josephine Igo, DO;  Location: [facility];  Service: Pulmonary;  Laterality: Bilateral;   Social History   Social History Narrative   Not on file   Immunization History  Administered Date(s) Administered   Influenza, Seasonal, Injecte, Preservative Fre 11/11/2022   Influenza,inj,Quad PF,6+ Mos 10/15/2020   PFIZER(Purple Top)SARS-COV-2 Vaccination 07/06/2019, 07/27/2019, 02/15/2020   Pfizer Covid-19 Vaccine Bivalent Booster 34yrs & up 10/21/2020     Objective: Vital Signs: BP 108/70 (BP Location: Left Arm, Patient Position: Sitting, Cuff Size: Normal)   Pulse 67   Resp 12   Ht 5' 8.5" (1.74 m)   Wt 231 lb (104.8 kg)   BMI 34.61 kg/m    Physical Exam Eyes:     Conjunctiva/sclera: Conjunctivae normal.  Cardiovascular:     Rate and Rhythm: Normal rate and regular rhythm.  Pulmonary:     Effort: Pulmonary effort is normal.     Breath sounds: Normal breath sounds.  Musculoskeletal:     Right lower leg: No edema.     Left lower leg: No edema.  Lymphadenopathy:     Cervical: No cervical adenopathy.  Skin:    General: Skin is warm and dry.     Findings: No rash.  Neurological:     Mental Status: He is alert.  Psychiatric:         Mood and Affect: Mood normal.      Musculoskeletal Exam:  Right upper arm with Popeye deformity mass palpable to the elbow on flexor side, very mildly tender to pressure with no obvious swelling, no significant pain in and around the shoulder joint Wrists full ROM no tenderness or swelling Fingers full ROM right hand mild chronic appearing joint thickening at thumb and 2nd-3rd MCP joints, no palpable swelling Knees full ROM no tenderness or swelling Ankles full ROM no tenderness or swelling  Investigation: No additional findings.  Imaging: [place] Extrem Up Right Ltd  Result Date: 11/24/2022 Limited musculoskeletal ultrasound of the right upper extremity, right shoulder was performed today.  Evaluation of the [facility] joint shows a mild effusion with bony spurring.  Evaluating the bicipital groove shows evidence of a high-grade proximal bicep tendon tear, there are still a few fibers left within the bicipital groove however.  Scanning the bicep tendon and  muscle distally there is evidence of proximal bicep tendon retraction down to the mid aspect of the bicep musculature.  Subscapularis was seen without evidence of tendinopathy or tearing.   Recent Labs: Lab Results  Component Value Date   WBC 6.5 11/09/2022   HGB 13.2 11/09/2022   PLT 198.0 11/09/2022   NA 140 11/09/2022   K 3.9 11/09/2022   CL 108 11/09/2022   CO2 25 11/09/2022   GLUCOSE 86 11/09/2022   BUN 15 11/09/2022   CREATININE 1.37 11/09/2022   BILITOT 0.5 11/09/2022   ALKPHOS 44 11/09/2022   AST 19 11/09/2022   ALT 13 11/09/2022   PROT 6.6 11/09/2022   ALBUMIN 4.2 11/09/2022   CALCIUM 9.3 11/09/2022   GFRAA >60 12/05/2015   QFTBGOLDPLUS Negative 05/19/2022    Speciality Comments: Avsola (infliximab) started 06/04/2022 - at [facility]  Procedures:  No procedures performed Allergies: Patient has no known allergies.   Assessment / Plan:     Visit Diagnoses: Sarcoidosis - Plan: Sedimentation rate,  C-reactive protein  No particular signs of active inflammation again today.  Tapering off the prednisone a small amount of increased joint stiffness would not be unusual.  There is no active synovitis no skin inflammation.  Will recheck sed rate and CRP for disease activity assessment if these are still normal agree with continuing on just the methotrexate and infliximab.  Prescriptions currently being managed through his pulmonology office so we can just follow-up at 6 months.  Acute combined systolic and diastolic heart failure (HCC) Cardiac sarcoidosis  No clinical signs suggesting heart failure decompensation, last cardiology office visit assessment class II symptoms.  Discussed most common complication of cardiac sarcoidosis was to conduction system.  Has ICD in place.  On diuretics, ARB, CCB.  Biceps tendon tear - Plan: Sedimentation rate, C-reactive protein, Ambulatory referral to Orthopedic Surgery  Findings appear consistent for a proximal tear of the biceps tendon.  I discussed that with his minimal pain and normal function in the shoulder I am not sure that any surgical intervention is needed but in order to make sure all of his options are appropriately addressed will refer to orthopedic surgery clinic.  Orders: Orders Placed This Encounter  Procedures   Sedimentation rate   C-reactive protein   Ambulatory referral to Orthopedic Surgery   No orders of the defined types were placed in this encounter.    Follow-Up Instructions: Return in about 6 months (around 05/17/2023).   Fuller Plan, MD  Note - This record has been created using [employer].  Chart creation errors have been sought, but may not always  have been located. Such creation errors do not reflect on  the standard of medical care.

## 2022-11-17 LAB — SEDIMENTATION RATE: Sed Rate: 2 mm/h (ref 0–20)

## 2022-11-17 LAB — C-REACTIVE PROTEIN: CRP: 3.7 mg/L (ref <8.0)

## 2022-11-19 ENCOUNTER — Encounter (HOSPITAL_COMMUNITY): Payer: Self-pay

## 2022-11-19 ENCOUNTER — Other Ambulatory Visit (HOSPITAL_COMMUNITY): Payer: Self-pay

## 2022-11-24 ENCOUNTER — Encounter: Payer: Self-pay | Admitting: Sports Medicine

## 2022-11-24 ENCOUNTER — Encounter: Payer: Self-pay | Admitting: Pulmonary Disease

## 2022-11-24 ENCOUNTER — Other Ambulatory Visit: Payer: Self-pay

## 2022-11-24 ENCOUNTER — Ambulatory Visit (INDEPENDENT_AMBULATORY_CARE_PROVIDER_SITE_OTHER): Payer: BC Managed Care – PPO | Admitting: Sports Medicine

## 2022-11-24 DIAGNOSIS — S46219A Strain of muscle, fascia and tendon of other parts of biceps, unspecified arm, initial encounter: Secondary | ICD-10-CM

## 2022-11-24 DIAGNOSIS — M79601 Pain in right arm: Secondary | ICD-10-CM | POA: Diagnosis not present

## 2022-11-24 NOTE — Progress Notes (Signed)
Patient says that about 8 months ago he was lifting a bag with a keyboard and felt a pull in the right arm/shoulder. He says that a day later he began having pain; patient denies any pain now. He says that he does have pain on top of the shoulder to the neck (about the right upper trapezius) that also began around time of injury. He says that he will have numbness and tingling in this area, and occasionally in the upper arm. Patient says he does not take any pain medication.

## 2022-11-24 NOTE — Progress Notes (Signed)
Nathan [name] - 57 y.o. male MRN [id number]  Date of birth: Feb 15, 1965  Office Visit Note: Visit Date: 11/24/2022 PCP: Dois Davenport, MD Referred by: Fuller Plan, MD  Subjective: Chief Complaint  Patient presents with   Right Shoulder - Pain   HPI: Nathan [name] is a pleasant 57 y.o. male who presents today for chronic right shoulder pain.  [place]sean had an incident about 8 months ago when he was lifting a heavy bag and felt a pull/injury to the right proximal shoulder.  A day later he began having pain and some swelling, although this resolved itself without treatment.  He still occasionally has some pain on the top of the shoulder near the neck/trapezius area.  He had weaned off of longer-term prednisone, currently on methotrexate and Infliximab injections.  Near the end of our visit today he did say that the left shoulder is actually the one that causes him more pain and gives him some clicking issues.  He has not had this evaluated nor been physical therapy for this.  Pertinent ROS were reviewed with the patient and found to be negative unless otherwise specified above in HPI.   Assessment & Plan: Visit Diagnoses:  1. Biceps tendon tear   2. 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  This happened months ago and he is not having any pain.  His strength is essentially the same as the contralateral side, discussed given this this is no need for surgical intervention.  He does have some reciprocal right-sided trapezius hypertonicity, may use heat, over-the-counter anti-inflammatories as needed.  He will make an appointment for his left shoulder at his leisure, likely will start with x-rays at that visit.  Follow-up: Return for may make new pt appt for L-shoulder at his leisure.   Meds & Orders: No  orders of the defined types were placed in this encounter.   Orders Placed This Encounter  Procedures   [place] Extrem Up Right Ltd     Procedures: No procedures performed      Clinical History: No specialty comments available.  He reports that he quit smoking about 12 years ago. His smoking use included cigarettes. He has never been exposed to tobacco smoke. He has never used smokeless tobacco. No results for input(s): "HGBA1C", "LABURIC" in the last 8760 hours.  Objective:    Physical Exam  Gen: Well-appearing, in no acute distress; non-toxic CV:  Well-perfused. Warm.  Resp: Breathing unlabored on room air; no wheezing. Psych: Fluid speech in conversation; appropriate affect; normal thought process Neuro: Sensation intact throughout. No gross coordination deficits.   Ortho Exam - Right shoulder/Trapezius: There is no tenderness within the bicipital groove or at the [facility] joint.  There is some increased muscle hypertonicity of the right greater than left trapezius musculature.  There is an obvious Popeye deformity.  There is no true weakness with resisted elbow flexion, very trivial resisted supination/pronation compared to the contralateral side.  Full range of motion about the shoulder in all directions.  Imaging: [place] Extrem Up Right Ltd  Result Date: 11/24/2022 Limited musculoskeletal ultrasound of the right upper extremity, right shoulder was performed today.  Evaluation of the [facility] joint shows a mild effusion with bony spurring.  Evaluating the bicipital groove shows evidence of a high-grade proximal bicep tendon  tear, there are still a few fibers left within the bicipital groove however.  Scanning the bicep tendon and muscle distally there is evidence of proximal bicep tendon retraction down to the mid aspect of the bicep musculature.  Subscapularis was seen without evidence of tendinopathy or tearing.   Past Medical/Family/Surgical/Social History: Medications & Allergies reviewed per  EMR, new medications updated. Patient Active Problem List   Diagnosis Date Noted   Biceps tendon tear 11/16/2022   Vitamin D deficiency 08/14/2022   High risk medication use 05/25/2022   Chronic systolic heart failure (HCC) 11/04/2020   ICD (implantable cardioverter-defibrillator) in place    Preoperative examination    Cardiac sarcoidosis 10/21/2020   Ventricular tachycardia, non-sustained (HCC) 10/21/2020   AKI (acute kidney injury) (HCC) 10/21/2020   Pleural effusion    Acute combined systolic and diastolic heart failure (HCC)    Sarcoidosis    Severe mitral regurgitation    Mediastinal lymphadenopathy 10/12/2020   Dyspnea 10/12/2020   Past Medical History:  Diagnosis Date   Allergies    09/16/2020   BPH (benign prostatic hyperplasia)    ED (erectile dysfunction)    GERD (gastroesophageal reflux disease)    Osteoarthritis    Peripheral focal chorioretinal inflammation of both eyes    Retinal vasculitis of both eyes    Sarcoidosis    Family History  Problem Relation Age of Onset   Stroke Mother    Chronic Renal Failure Mother    Dementia Mother    Cancer Father 52   Diabetes Father    Pancreatic cancer Father    Past Surgical History:  Procedure Laterality Date   BRONCHIAL NEEDLE ASPIRATION BIOPSY  10/18/2020   Procedure: BRONCHIAL NEEDLE ASPIRATION BIOPSIES;  Surgeon: Josephine Igo, DO;  Location: [facility];  Service: Pulmonary;;   BRONCHIAL WASHINGS  10/18/2020   Procedure: BRONCHIAL WASHINGS;  Surgeon: Josephine Igo, DO;  Location: [facility];  Service: Pulmonary;;   ICD IMPLANT N/A 10/28/2020   Procedure: ICD IMPLANT;  Surgeon: Lanier Prude, MD;  Location: [facility];  Service: Cardiovascular;  Laterality: N/A;   RIGHT/LEFT HEART CATH AND CORONARY ANGIOGRAPHY N/A 10/16/2020   Procedure: RIGHT/LEFT HEART CATH AND CORONARY ANGIOGRAPHY;  Surgeon: Marykay Lex, MD;  Location: [facility] INVASIVE CV LAB;  Service: Cardiovascular;  Laterality: N/A;    VIDEO BRONCHOSCOPY WITH ENDOBRONCHIAL ULTRASOUND Bilateral 10/18/2020   Procedure: VIDEO BRONCHOSCOPY WITH ENDOBRONCHIAL ULTRASOUND;  Surgeon: Josephine Igo, DO;  Location: [facility];  Service: Pulmonary;  Laterality: Bilateral;   Social History   Occupational History   Not on file  Tobacco Use   Smoking status: Former    Current packs/day: 0.00    Types: Cigarettes    Quit date: 02/02/2010    Years since quitting: 12.8    Passive exposure: Never   Smokeless tobacco: Never  Vaping Use   Vaping status: Never Used  Substance and Sexual Activity   Alcohol use: Yes    Alcohol/week: 1.0 standard drink of alcohol    Types: 1 Cans of beer per week   Drug use: Not Currently   Sexual activity: Not on file

## 2022-12-01 ENCOUNTER — Other Ambulatory Visit: Payer: Self-pay

## 2022-12-01 ENCOUNTER — Ambulatory Visit (INDEPENDENT_AMBULATORY_CARE_PROVIDER_SITE_OTHER): Payer: BC Managed Care – PPO | Admitting: Sports Medicine

## 2022-12-01 ENCOUNTER — Encounter: Payer: Self-pay | Admitting: Sports Medicine

## 2022-12-01 ENCOUNTER — Other Ambulatory Visit (INDEPENDENT_AMBULATORY_CARE_PROVIDER_SITE_OTHER): Payer: BC Managed Care – PPO

## 2022-12-01 DIAGNOSIS — M25511 Pain in right shoulder: Secondary | ICD-10-CM

## 2022-12-01 DIAGNOSIS — G8929 Other chronic pain: Secondary | ICD-10-CM | POA: Diagnosis not present

## 2022-12-01 DIAGNOSIS — M75102 Unspecified rotator cuff tear or rupture of left shoulder, not specified as traumatic: Secondary | ICD-10-CM | POA: Diagnosis not present

## 2022-12-01 DIAGNOSIS — M25512 Pain in left shoulder: Secondary | ICD-10-CM

## 2022-12-01 DIAGNOSIS — S46219A Strain of muscle, fascia and tendon of other parts of biceps, unspecified arm, initial encounter: Secondary | ICD-10-CM | POA: Diagnosis not present

## 2022-12-01 MED ORDER — BUPIVACAINE HCL 0.25 % IJ SOLN
2.0000 mL | INTRAMUSCULAR | Status: AC | PRN
  Administered 2022-12-01: 2 mL via INTRA_ARTICULAR

## 2022-12-01 MED ORDER — METHYLPREDNISOLONE ACETATE 40 MG/ML IJ SUSP
40.0000 mg | INTRAMUSCULAR | Status: AC | PRN
  Administered 2022-12-01: 40 mg via INTRA_ARTICULAR

## 2022-12-01 MED ORDER — LIDOCAINE HCL 1 % IJ SOLN
2.0000 mL | INTRAMUSCULAR | Status: AC | PRN
  Administered 2022-12-01: 2 mL

## 2022-12-01 NOTE — Progress Notes (Signed)
Office Visit Note   Patient: Nathan [name]           Date of Birth: 1965/06/20           MRN: [id number] Visit Date: 12/01/2022              Requested by: Dois Davenport, MD [address] [place],  [place] [address] PCP: Dois Davenport, MD  Medical Resident, Sports Medicine Fellow - Attending Physician Addendum:   I have independently interviewed and examined the patient myself. I have discussed the above with the original author and agree with their documentation. My edits for correction/addition/clarification have been made, see any changes above and below.   In summary, a pleasant 57 year old male who has bilateral shoulder pain.  We did ultrasound his left shoulder today which showed partial-thickness interstitial tearing of the supraspinatus without full-thickness tearing or retraction. We did proceed with subacromial joint injection today.  We will get him started in formalized physical therapy for both the left and the right shoulder which has b/l rotator cuff arthropathy.  He has a biceps tendon tear on the right with Popeye deformity.  I would like to see him back in about 6 weeks for reevaluation of both shoulders.  Madelyn Brunner, DO Primary Care Sports Medicine Physician  [facility] - Orthopedics   Assessment & Plan: Visit Diagnoses:  1. Chronic left shoulder pain   2. Nontraumatic tear of supraspinatus tendon, left   3. Chronic right shoulder pain   4. Biceps tendon tear    Plan: Patient's ultrasound and physical exam findings are consistent with some partial-thickness tearing of the supraspinatus.  Patient's x-ray shows appropriate joint space and this is less likely a degenerative change and more likely an injury to the rotator cuff.  Discussed options with patient regarding physical therapy, anti-inflammatory versus steroid injection.  Patient does have a significant cardiac history and is unable to take any anti-inflammatories.  Patient would  like to go ahead with a steroid injection at this time in order to get some relief so that he may proceed with physical therapy. Patient was able to tolerate a subacromial injection well without any difficulty.  Patient is to follow-up with physical therapy and back in the clinic in approximately 6 weeks.  Follow-Up Instructions: Return in about 6 weeks (around 01/12/2023) for for bilateral shoulders.   Orders:  Orders Placed This Encounter  Procedures   Large Joint Inj   XR Shoulder Left   [place] Extrem Up Left Ltd   Ambulatory referral to Physical Therapy   No orders of the defined types were placed in this encounter.     Procedures: Large Joint Inj: L subacromial bursa on 12/01/2022 12:30 PM Indications: pain Details: 22 G 1.5 in needle, posterior approach Medications: 2 mL lidocaine 1 %; 2 mL bupivacaine 0.25 %; 40 mg methylPREDNISolone acetate 40 MG/ML Outcome: tolerated well, no immediate complications  Subacromial Joint Injection, Left Shoulder After discussion on risks/benefits/indications, informed verbal consent was obtained. A timeout was then performed. Patient was seated on table in exam room. The patient's shoulder was prepped with betadine and alcohol swabs and utilizing posterior approach a 22G, 1.5" needle was directed anteriorly and laterally into the patient's subacromial space was injected with 2:2:1 mixture of lidocaine:bupivicaine:depomedrol with appreciation of free-flowing of the injectate into the bursal space. Patient tolerated the procedure well without immediate complications.   Procedure, treatment alternatives, risks and benefits explained, specific risks discussed. Consent was  given by the patient. Immediately prior to procedure a time out was called to verify the correct patient, procedure, equipment, support staff and site/side marked as required. Patient was prepped and draped in the usual sterile fashion.       Clinical Data: No additional  findings.   Subjective: Chief Complaint  Patient presents with   Left Shoulder - Pain    Patient is presenting with some left-sided shoulder pain has been going on for approximately 1 year.  Patient states that about a year ago he was walking down some steps and his left arm got caught on something and was dragged back.  Patient dates that since then he has been dealing with pain whenever he tries to abduct things on his left side.  Patient states that the pain does not keep him up or night or anything    Review of Systems   Objective: Vital Signs: There were no vitals taken for this visit.  Physical Exam  Shoulder, Left. No  skin changes, erythema, or ecchymosis noted. No evidence of bony deformity, asymmetry, or muscle atrophy; No tenderness over long head of biceps (bicipital groove). Mild TTP at [facility] joint. Full active and passive range of motion (180 flex Elisabeth Most /150Abd /90ER /70IR),  Thumb to T12 without significant tenderness. Strength 4/5 throughout with abduction of left side, external rotation with pain though strength preserved.   Special Tests:   - Painful Arc present at 100 degrees   - Empty can: POS   - Int/Ext Rotation test: NEG/POS   Rush Barer Lift-Off Test: NEG   - Crossarm Adduction test: POS   - Hawkins: NEG   - Neer test: POS   - O'brien's test: POS    Ortho Exam  Specialty Comments:  No specialty comments available.  Imaging: XR Shoulder Left  Result Date: 12/01/2022 3 view x-ray left shoulder AP, axial and Grashey view.  X-ray shows appropriate joint space between the humeral head and the glenoid.  There is some narrowing of the AC joint, indicative of OA.  There is no cortical irregularities noted along the neck of the humerus.  There is some sclerotic changes of the humeral head on the lateral aspect of the greater tuberosity. No acute fracture.  [place] Extrem Up Left Ltd  Result Date: 12/01/2022 Biceps tendon shows no signs of hypoechoic changes  and tendon appears to be intact without any concern.  AC joint does show some narrowing as well as some spurring coming off the distal clavicle, no significant effusion noted.  The supraspinatus has some minor hypoechoic changes consistent with some small muscle tears in the proximal body of the muscle however the tendon insertion appears appropriate without any signs of fluid accumulation or tears.  There is some mild swelling of the supraspinatus bursa.  The infraspinatus has no signs of any hypoechoic changes, no tears noted of the tendon or the muscle belly.  Insertion of the tendon is appropriate on its footplate.   Partial tearing of the proximal supraspinatus tendon without full-thickness or retraction.    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   Ventricular tachycardia, non-sustained (HCC) 10/21/2020   AKI (acute kidney injury) (HCC) 10/21/2020   Pleural effusion  Acute combined systolic and diastolic heart failure (HCC)    Sarcoidosis    Severe mitral regurgitation    Mediastinal lymphadenopathy 10/12/2020   Dyspnea 10/12/2020   Past Medical History:  Diagnosis Date   Allergies    09/16/2020   BPH (benign prostatic hyperplasia)    ED (erectile dysfunction)    GERD (gastroesophageal reflux disease)    Osteoarthritis    Peripheral focal chorioretinal inflammation of both eyes    Retinal vasculitis of both eyes    Sarcoidosis     Family History  Problem Relation Age of Onset   Stroke Mother    Chronic Renal Failure Mother    Dementia Mother    Cancer Father 75   Diabetes Father    Pancreatic cancer Father     Past Surgical History:  Procedure Laterality Date   BRONCHIAL  NEEDLE ASPIRATION BIOPSY  10/18/2020   Procedure: BRONCHIAL NEEDLE ASPIRATION BIOPSIES;  Surgeon: Josephine Igo, DO;  Location: [facility];  Service: Pulmonary;;   BRONCHIAL WASHINGS  10/18/2020   Procedure: BRONCHIAL WASHINGS;  Surgeon: Josephine Igo, DO;  Location: [facility];  Service: Pulmonary;;   ICD IMPLANT N/A 10/28/2020   Procedure: ICD IMPLANT;  Surgeon: Lanier Prude, MD;  Location: [facility] INVAS[facility] CV LAB;  Service: Cardiovascular;  Laterality: N/A;   RIGHT/LEFT HEART CATH AND CORONARY ANGIOGRAPHY N/A 10/16/2020   Procedure: RIGHT/LEFT HEART CATH AND CORONARY ANGIOGRAPHY;  Surgeon: Marykay Lex, MD;  [facility] INVASIVE CV LAB;  Service: Cardiovascular;  Laterality: N/A;   VIDEO BRONCHOSCOPY WITH ENDOBRONCHIAL ULTRASOUND Bilateral 10/18/2020   Procedure: VIDEO BRONCHOSCOPY WITH ENDOBRONCHIAL ULTRASOUND;  Surgeon: Josephine Igo, DO;  Location: [facility];  Service: Pulmonary;  Laterality: Bilateral;   Social History   Occupational History   Not on file  Tobacco Use   Smoking status: Former    Current packs/day: 0.00    Types: Cigarettes    Quit date: 02/02/2010    Years since quitting: 12.8    Passive exposure: Never   Smokeless tobacco: Never  Vaping Use   Vaping status: Never Used  Substance and Sexual Activity   Alcohol use: Yes    Alcohol/week: 1.0 standard drink of alcohol    Types: 1 Cans of beer per week   Drug use: Not Currently   Sexual activity: Not on file

## 2022-12-12 ENCOUNTER — Other Ambulatory Visit (HOSPITAL_COMMUNITY): Payer: Self-pay | Admitting: Family Medicine

## 2022-12-14 ENCOUNTER — Other Ambulatory Visit: Payer: Self-pay

## 2022-12-14 ENCOUNTER — Other Ambulatory Visit (HOSPITAL_COMMUNITY): Payer: Self-pay

## 2022-12-14 MED ORDER — LOSARTAN POTASSIUM 25 MG PO TABS
12.5000 mg | ORAL_TABLET | Freq: Every day | ORAL | 0 refills | Status: DC
  Filled 2022-12-14: qty 45, 90d supply, fill #0

## 2022-12-15 ENCOUNTER — Encounter: Payer: Self-pay | Admitting: Pulmonary Disease

## 2022-12-15 ENCOUNTER — Other Ambulatory Visit (HOSPITAL_COMMUNITY): Payer: Self-pay

## 2022-12-22 ENCOUNTER — Encounter: Payer: Self-pay | Admitting: Physical Therapy

## 2022-12-22 ENCOUNTER — Ambulatory Visit: Payer: 59 | Attending: Sports Medicine | Admitting: Physical Therapy

## 2022-12-22 DIAGNOSIS — M75102 Unspecified rotator cuff tear or rupture of left shoulder, not specified as traumatic: Secondary | ICD-10-CM | POA: Diagnosis not present

## 2022-12-22 DIAGNOSIS — M25511 Pain in right shoulder: Secondary | ICD-10-CM | POA: Insufficient documentation

## 2022-12-22 DIAGNOSIS — G8929 Other chronic pain: Secondary | ICD-10-CM | POA: Insufficient documentation

## 2022-12-22 DIAGNOSIS — M25512 Pain in left shoulder: Secondary | ICD-10-CM | POA: Diagnosis not present

## 2022-12-22 DIAGNOSIS — R293 Abnormal posture: Secondary | ICD-10-CM | POA: Diagnosis not present

## 2022-12-22 DIAGNOSIS — S46219A Strain of muscle, fascia and tendon of other parts of biceps, unspecified arm, initial encounter: Secondary | ICD-10-CM | POA: Insufficient documentation

## 2022-12-22 NOTE — Therapy (Signed)
OUTPATIENT PHYSICAL THERAPY SHOULDER EVALUATION   Patient Name: Nathan [name] MRN: [id number] DOB:09/26/1965, 57 y.o., male Today's Date: 12/22/2022  END OF SESSION:  PT End of Session - 12/22/22 1243     Visit Number 1    Number of Visits 16    Date for PT Re-Evaluation 02/16/23    Authorization Type [facility] [employer] blue until dec. 1    PT Start Time 1234    PT Stop Time 1318    PT Time Calculation (min) 44 min    Activity Tolerance Patient tolerated treatment well    Behavior During Therapy [facility] for tasks assessed/performed             Past Medical History:  Diagnosis Date   Allergies    09/16/2020   BPH (benign prostatic hyperplasia)    ED (erectile dysfunction)    GERD (gastroesophageal reflux disease)    Osteoarthritis    Peripheral focal chorioretinal inflammation of both eyes    Retinal vasculitis of both eyes    Sarcoidosis    Past Surgical History:  Procedure Laterality Date   BRONCHIAL NEEDLE ASPIRATION BIOPSY  10/18/2020   Procedure: BRONCHIAL NEEDLE ASPIRATION BIOPSIES;  Surgeon: Josephine Igo, DO;  Location: [facility];  Service: Pulmonary;;   BRONCHIAL WASHINGS  10/18/2020   Procedure: BRONCHIAL WASHINGS;  Surgeon: Josephine Igo, DO;  Location: [facility];  Service: Pulmonary;;   ICD IMPLANT N/A 10/28/2020   Procedure: ICD IMPLANT;  Surgeon: Lanier Prude, MD;  [facility]: [facility] CV LAB;  Service: Cardiovascular;  Laterality: N/A;   RIGHT/LEFT HEART CATH AND CORONARY ANGIOGRAPHY N/A 10/16/2020   Procedure: RIGHT/LEFT HEART CATH AND CORONARY ANGIOGRAPHY;  Surgeon: Marykay Lex, MD;  Location: [facility] INVASIVE CV LAB;  Service: Cardiovascular;  Laterality: N/A;   VIDEO BRONCHOSCOPY WITH ENDOBRONCHIAL ULTRASOUND Bilateral 10/18/2020   Procedure: VIDEO BRONCHOSCOPY WITH ENDOBRONCHIAL ULTRASOUND;  Surgeon: Josephine Igo, DO;  Location: [facility];  Service: Pulmonary;  Laterality: Bilateral;   Patient Active Problem List   Diagnosis  Date Noted   Biceps tendon tear 11/16/2022   Vitamin D deficiency 08/14/2022   High risk medication use 05/25/2022   Chronic systolic heart failure (HCC) 11/04/2020   ICD (implantable cardioverter-defibrillator) in place    Preoperative examination    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 Nadyne Coombes REFERRING PROVIDER: Madelyn Brunner, DO  REFERRING DIAG: [phone] (ICD-10-CM) - Chronic left shoulder pain M75.102 (ICD-10-CM) - Nontraumatic tear of supraspinatus tendon, left M25.511,G89.29 (ICD-10-CM) - Chronic right shoulder pain S46.219A (ICD-10-CM) - Biceps tendon tear  THERAPY DIAG:  Chronic pain of both shoulders  Abnormal posture  Rationale for Evaluation and Treatment: Rehabilitation  ONSET DATE: 12 to 18 months ago  SUBJECTIVE:  SUBJECTIVE STATEMENT: Patient reports injuries over 12 months ago at separate times.  His right arm was injured about a year ago as he lifted a heavy piece of work equipment.  He had severe pain and later that night noticed a bulging bicep on that side but the pain did eventually resolve.  Sometime before that he lost his balance while walking and fell into the wall, caught himself with his left arm. Currently he reports right-sided anterior shoulder and arm pain which extends down the front of his right arm.  He reports the pain radiates from the side of his neck down to his right elbow.  Today that area is numb.  The left shoulder is painful in the front and top kind of  generally.  He reports he will have pain and crepitus when reaching certain ways.  He used to work out and performing a lateral raise on that left side was extremely painful.  He recently received a shot in the left shoulder and that has improved it quite a bit He currently has difficulty sleeping on his right side due to the pain.  He did have x-ray and ultrasound of each arm see below for results.  He has not had physical therapy before this time.     Hand dominance: Right  PERTINENT HISTORY: Sarcoidosis, defibrillator implant PAIN:  Are you having pain? Yes: NPRS scale: 3-4/10 Pain location: Rt UE ant  Pain description: numb and sore  Aggravating factors: positioning  Relieving factors: cream, changing positions helps   Yes: NPRS scale: 2/10 Pain location: Lt.UE ant  Pain description: sharp pain, aching Aggravating factors: reaching out to the side  Relieving factors: injection   PRECAUTIONS: None no electric stim due to defibrillator  RED FLAGS: None   WEIGHT BEARING RESTRICTIONS: No  FALLS:  Has patient fallen in last 6 months? No he does feel off balance sometimes.    LIVING ENVIRONMENT: Lives with: lives with their family mom with health issues .   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   PLOF: Independent, Vocation/Vocational requirements: plays every week, setting up and load/unload equipment, and Leisure: golf  He does all the cooking in the house work for his mother who is independent  with mobility  PATIENT GOALS:I want to be able to get stronger, lift weights and prevent surgery if possible.   NEXT MD VISIT: 6 weeks  OBJECTIVE:  Note: Objective measures were completed at Evaluation unless otherwise noted.  DIAGNOSTIC FINDINGS:  Partial tearing of the proximal supraspinatus tendon without full-thickness or retraction.     Biceps tendon  shows no signs of hypoechoic changes and tendon appears to be intact without any concern. AC joint does show some narrowing as well as some spurring coming off the distal clavicle, no significant effusion noted. The supraspinatus has some minor hypoechoic changes consistent with some small muscle tears in the proximal body of the muscle however the tendon insertion appears appropriate without any signs of fluid accumulation or tears. There is some mild swelling of the supraspinatus bursa. The infraspinatus has no signs of any hypoechoic changes, no tears noted of the tendon or the muscle belly. Insertion of the tendon is appropriate on its footplate.  3 view x-ray left shoulder AP, axial and Grashey view.  X-ray shows appropriate joint space between the humeral head and the glenoid.  There is some narrowing of the  AC joint, indicative of OA.  There is no cortical irregularities noted along the neck of the humerus.  There is some sclerotic changes of the humeral head on the lateral aspect of the greater tuberosity. No acute fracture.    PATIENT SURVEYS:  FOTO 66%  COGNITION: Overall cognitive status: Within functional limits for tasks assessed     SENSATION: Right upper extremity feels numb with certain positions and sitting  POSTURE: Forward head posture glenohumeral internal rotation humeral head sit anteriorly scapula a AB ducted from midline Right Popeye sign UPPER EXTREMITY ROM:   Active ROM Right eval Left eval  Shoulder flexion 155 150  Shoulder extension    Shoulder abduction 140 140  Shoulder adduction    Shoulder internal rotation Mid T  At 90 degrees of abduction, 80 T5 At 90 degrees of abduction, 60  Shoulder external rotation T3 At 90 degrees of abduction 75 T3, at 90 degrees of abduction 60 deg  Elbow flexion full Full  Elbow extension 0 0  Wrist flexion    Wrist extension    Wrist ulnar deviation    Wrist radial deviation    Wrist pronation    Wrist supination     (Blank rows = not tested)  UPPER EXTREMITY MMT:  MMT Right eval Left eval  Shoulder flexion 4 4-  Shoulder extension    Shoulder abduction 4+ 4-  Shoulder adduction    Shoulder internal rotation 5 5  Shoulder external rotation 5 5  Middle trapezius    Lower trapezius    Elbow flexion 5 5  Elbow extension 5 5  Wrist flexion    Wrist extension    Wrist ulnar deviation    Wrist radial deviation    Wrist pronation    Wrist supination    Grip strength (lbs)    (Blank rows = not tested)  SHOULDER SPECIAL TESTS: Rotator cuff assessment: Empty can test: positive  Biceps assessment:  Normal strength  Cervical active range of motion Flexion 58 no pain Extension 55 no pain Right sidebending 47 Left sidebending 49 Right rotation within normal limits Left rotation lacks 10 to 15% end range  Negative Spurling test bilateral Suboccipital release, manual traction felt good to patient but he did not have numbness in his right arm at that time to compare.  JOINT MOBILITY TESTING:  Posterior capsule tightness  PALPATION:  Patient sore with gross palpation to bilateral anterior shoulders Soreness noted along bilateral posterior cervical muscles and tension in upper traps   TODAY'S TREATMENT:                                                                                                                                         DATE: 12/22/22   PATIENT EDUCATION: Education details: PT, plan of care, home exercise program Person educated: Patient Education method: Explanation, Demonstration, and Handouts Education comprehension: verbalized understanding, returned demonstration, and  needs further education  HOME EXERCISE PROGRAM: Access Code: [email or web address] URL: [email or web address] Date: 12/22/2022 Prepared by: Karie Mainland  Exercises - Sidelying Shoulder Abduction with Resistance to 60 Degrees  - 1-2 x daily - 7 x weekly - 2 sets - 10 reps - 5 hold - Supine  Shoulder Horizontal Abduction with Resistance  - 1-2 x daily - 7 x weekly - 2 sets - 10 reps - 5 hold - Shoulder External Rotation and Scapular Retraction with Resistance  - 1-2 x daily - 7 x weekly - 2 sets - 10 reps - 5 hold  ASSESSMENT:  CLINICAL IMPRESSION: Patient is a 57y.o. male who was seen today for physical therapy evaluation and treatment for bilateral shoulder pain.  Right sided shoulder and arm pain likely due to postural tightness.  Pain and numbness reduced when posture is corrected and cervical testing was normal  OBJECTIVE IMPAIRMENTS: decreased ROM, decreased strength, increased fascial restrictions, impaired sensation, impaired UE functional use, postural dysfunction, and pain.   ACTIVITY LIMITATIONS: carrying, lifting, sitting, and reach over head  PARTICIPATION LIMITATIONS: shopping, community activity, occupation, and yard work  PERSONAL FACTORS: Time since onset of injury/illness/exacerbation and 1-2 comorbidities: Right sided biceps tear, chronic  are also affecting patient's functional outcome.   REHAB POTENTIAL: Excellent  CLINICAL DECISION MAKING: Stable/uncomplicated  EVALUATION COMPLEXITY: Low   GOALS: Goals reviewed with patient? Yes  SHORT TERM GOALS: Target date: 01/19/2023    Patient will be independent with home exercise program Baseline: Goal status: INITIAL  2.  Patient will sit with corrected posture and make changes to his use of technology to reduce tension on neck Baseline:  Goal status: INITIAL  3.  Balance screen will be completed Baseline:  Goal status: INITIAL   LONG TERM GOALS: Target date: 02/16/2023    Patient will be independent with home exercise program and gym exercises upon discharge Baseline:  Goal status: INITIAL  2.  Patient will be able to demonstrate normal active range of motion in bilateral shoulders without increased pain Baseline:  Goal status: INITIAL  3.  Bilateral shoulder strength will be 5/5 in all  planes in order to perform lifting and carrying activities Baseline:  Goal status: INITIAL  4.  Patient will be able to sleep on his right side without difficulty Baseline:  Goal status: INITIAL  5.  Foto score will improve to 76% Baseline: 66% Goal status: INITIAL  PLAN:  PT FREQUENCY: 2x/week  PT DURATION: 8 weeks6-8  PLANNED INTERVENTIONS: 97164- PT Re-evaluation, 97110-Therapeutic exercises, 97530- Therapeutic activity, 97112- Neuromuscular re-education, 97535- Self Care, 16109- Manual therapy, Patient/Family education, Balance training, Taping, Dry Needling, Joint mobilization, Cryotherapy, and Moist heat  PLAN FOR NEXT SESSION: Check home exercise program, posture scapular stabilization, UB E   Tomie Spizzirri, PT 12/22/2022, 2:59 PM   Karie Mainland, PT 12/22/22 3:21 PM Phone: [phone] Fax: [phone]   For all possible CPT codes, reference the Planned Interventions line above.     Check all conditions that are expected to impact treatment: {Conditions expected to impact treatment:None of these apply   If treatment provided at initial evaluation, no treatment charged due to lack of authorization.

## 2022-12-29 ENCOUNTER — Telehealth (HOSPITAL_BASED_OUTPATIENT_CLINIC_OR_DEPARTMENT_OTHER): Payer: Self-pay | Admitting: Pulmonary Disease

## 2022-12-30 ENCOUNTER — Ambulatory Visit: Payer: 59 | Admitting: Physical Therapy

## 2023-01-03 ENCOUNTER — Encounter: Payer: Self-pay | Admitting: Pulmonary Disease

## 2023-01-04 ENCOUNTER — Ambulatory Visit: Payer: 59

## 2023-01-06 ENCOUNTER — Ambulatory Visit: Payer: 59

## 2023-01-06 NOTE — Therapy (Signed)
OUTPATIENT PHYSICAL THERAPY SHOULDER TREATMENT   Patient Name: Nathan [name] MRN: [id number] DOB:[birth year], 57 y.o., male Today's Date: 01/07/2023  END OF SESSION:  PT End of Session - 01/07/23 0849     Visit Number 2    Number of Visits 16    Date for PT Re-Evaluation 02/16/23    Authorization Type [facility] [employer] until dec. 1    PT Start Time [phone]    PT Stop Time 0930    PT Time Calculation (min) 43 min    Activity Tolerance Patient tolerated treatment well    Behavior During Therapy [facility] for tasks assessed/performed              Past Medical History:  Diagnosis Date   Allergies    09/16/2020   BPH (benign prostatic hyperplasia)    ED (erectile dysfunction)    GERD (gastroesophageal reflux disease)    Osteoarthritis    Peripheral focal chorioretinal inflammation of both eyes    Retinal vasculitis of both eyes    Sarcoidosis    Past Surgical History:  Procedure Laterality Date   BRONCHIAL NEEDLE ASPIRATION BIOPSY  10/18/2020   Procedure: BRONCHIAL NEEDLE ASPIRATION BIOPSIES;  Surgeon: Josephine Igo, DO;  Location: [facility];  Service: Pulmonary;;   BRONCHIAL WASHINGS  10/18/2020   Procedure: BRONCHIAL WASHINGS;  Surgeon: Josephine Igo, DO;  Location: [facility];  Service: Pulmonary;;   ICD IMPLANT N/A 10/28/2020   Procedure: ICD IMPLANT;  Surgeon: Lanier Prude, MD;  Location: [facility] INVASIVE CV LAB;  Service: Cardiovascular;  Laterality: N/A;   RIGHT/LEFT HEART CATH AND CORONARY ANGIOGRAPHY N/A 10/16/2020   Procedure: RIGHT/LEFT HEART CATH AND CORONARY ANGIOGRAPHY;  Surgeon: Marykay Lex, MD;  Location: [facility] INVASIVE CV LAB;  Service: Cardiovascular;  Laterality: N/A;   VIDEO BRONCHOSCOPY WITH ENDOBRONCHIAL ULTRASOUND Bilateral 10/18/2020   Procedure: VIDEO BRONCHOSCOPY WITH ENDOBRONCHIAL ULTRASOUND;  Surgeon: Josephine Igo, DO;  Location: [facility];  Service: Pulmonary;  Laterality: Bilateral;   Patient Active Problem List   Diagnosis  Date Noted   Biceps tendon tear 11/16/2022   Vitamin D deficiency 08/14/2022   High risk medication use 05/25/2022   Chronic systolic heart failure (HCC) 11/04/2020   ICD (implantable cardioverter-defibrillator) in place    Preoperative examination    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 Nadyne Coombes REFERRING PROVIDER: Madelyn Brunner, DO  REFERRING DIAG: [phone] (ICD-10-CM) - Chronic left shoulder pain M75.102 (ICD-10-CM) - Nontraumatic tear of supraspinatus tendon, left M25.511,G89.29 (ICD-10-CM) - Chronic right shoulder pain S46.219A (ICD-10-CM) - Biceps tendon tear  THERAPY DIAG:  Chronic pain of both shoulders  Abnormal posture  Rationale for Evaluation and Treatment: Rehabilitation  ONSET DATE: 12 to 18 months ago  SUBJECTIVE:  SUBJECTIVE STATEMENT: Pt reports his bilat shoulder pain is the same. Pt reports he is completing his HEP.  EVAL: Patient reports injuries over 12 months ago at separate times.  His right arm was injured about a year ago as he lifted a heavy piece of work equipment.  He had severe pain and later that night noticed a bulging bicep on that side but the pain did eventually resolve.  Sometime before that he lost his balance while walking and fell into the wall, caught himself with his left arm. Currently he reports right-sided anterior shoulder and arm pain which extends down the front of his right arm.  He reports the pain radiates from the side of his neck down to his right  elbow.  Today that area is numb.  The left shoulder is painful in the front and top kind of generally.  He reports he will have pain and crepitus when reaching certain ways.  He used to work out and performing a lateral raise on that left side was extremely painful.  He recently received a shot in the left shoulder and that has improved it quite a bit He currently has difficulty sleeping on his right side due to the pain.  He did have x-ray and ultrasound of each arm see below for results.  He has not had physical therapy before this time.     Hand dominance: Right  PERTINENT HISTORY: Sarcoidosis, defibrillator implant PAIN:  Are you having pain? Yes: NPRS scale: 3-4/10 Pain location: Rt UE ant  Pain description: numb and sore  Aggravating factors: positioning  Relieving factors: cream, changing positions helps   Yes: NPRS scale: 2/10 Pain location: Lt.UE ant  Pain description: sharp pain, aching Aggravating factors: reaching out to the side  Relieving factors: injection   PRECAUTIONS: None no electric stim due to defibrillator  RED FLAGS: None   WEIGHT BEARING RESTRICTIONS: No  FALLS:  Has patient fallen in last 6 months? No he does feel off balance sometimes.    LIVING ENVIRONMENT: Lives with: lives with their family mom with health issues .   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   PLOF: Independent, Vocation/Vocational requirements: plays every week, setting up and load/unload equipment, and Leisure: golf  He does all the cooking in the house work for his mother who is independent  with mobility  PATIENT GOALS:I want to be able to get stronger, lift weights and prevent surgery if possible.   NEXT MD VISIT: 6 weeks  OBJECTIVE:  Note: Objective measures were completed at Evaluation unless otherwise noted.  DIAGNOSTIC FINDINGS:  Partial tearing of the  proximal supraspinatus tendon without full-thickness or retraction.     Biceps tendon shows no signs of hypoechoic changes and tendon appears to be intact without any concern. AC joint does show some narrowing as well as some spurring coming off the distal clavicle, no significant effusion noted. The supraspinatus has some minor hypoechoic changes consistent with some small muscle tears in the proximal body of the muscle however the tendon insertion appears appropriate without any signs of fluid accumulation or tears. There is some mild swelling of the supraspinatus bursa. The infraspinatus has no signs of any hypoechoic changes, no tears noted of the tendon or the muscle belly. Insertion of the tendon is appropriate on its footplate.  3 view x-ray left shoulder AP, axial and Grashey view.  X-ray  shows appropriate joint space between the humeral head and the glenoid.  There is some narrowing of the AC joint, indicative of OA.  There is no cortical irregularities noted along the neck of the humerus.  There is some sclerotic changes of the humeral head on the lateral aspect of the greater tuberosity. No acute fracture.    PATIENT SURVEYS:  FOTO 66%  COGNITION: Overall cognitive status: Within functional limits for tasks assessed     SENSATION: Right upper extremity feels numb with certain positions and sitting  POSTURE: Forward head posture glenohumeral internal rotation humeral head sit anteriorly scapula a AB ducted from midline Right Popeye sign UPPER EXTREMITY ROM:   Active ROM Right eval Left eval  Shoulder flexion 155 150  Shoulder extension    Shoulder abduction 140 140  Shoulder adduction    Shoulder internal rotation Mid T  At 90 degrees of abduction, 80 T5 At 90 degrees of abduction, 60  Shoulder external rotation T3 At 90 degrees of abduction 75 T3, at 90 degrees of abduction 60 deg  Elbow flexion full Full  Elbow extension 0 0  Wrist flexion    Wrist extension    Wrist  ulnar deviation    Wrist radial deviation    Wrist pronation    Wrist supination    (Blank rows = not tested)  UPPER EXTREMITY MMT:  MMT Right eval Left eval  Shoulder flexion 4 4-  Shoulder extension    Shoulder abduction 4+ 4-  Shoulder adduction    Shoulder internal rotation 5 5  Shoulder external rotation 5 5  Middle trapezius    Lower trapezius    Elbow flexion 5 5  Elbow extension 5 5  Wrist flexion    Wrist extension    Wrist ulnar deviation    Wrist radial deviation    Wrist pronation    Wrist supination    Grip strength (lbs)    (Blank rows = not tested)  SHOULDER SPECIAL TESTS: Rotator cuff assessment: Empty can test: positive  Biceps assessment:  Normal strength  Cervical active range of motion Flexion 58 no pain Extension 55 no pain Right sidebending 47 Left sidebending 49 Right rotation within normal limits Left rotation lacks 10 to 15% end range  Negative Spurling test bilateral Suboccipital release, manual traction felt good to patient but he did not have numbness in his right arm at that time to compare.  JOINT MOBILITY TESTING:  Posterior capsule tightness  PALPATION:  Patient sore with gross palpation to bilateral anterior shoulders Soreness noted along bilateral posterior cervical muscles and tension in upper traps   TODAY'S TREATMENT:    [facility] Adult PT Treatment:                                                DATE: 01/07/23 Therapeutic Exercise: Sidelying Shoulder Abduction with Resistance to 60 Degrees 2 sets - 10 reps - 5 hold Supine Shoulder Horizontal Abduction with Resistance 2 sets - 10 reps - 5 hold Shoulder External Rotation and Scapular Retraction with Resistance 2 sets - 10 reps - 5 hold Corner Pec Minor Stretch 3 reps - 30 hold Doorway Pec Stretch at 90 Degrees Abduction 3 reps - 30 Manual Therapy: STM to the bilat upper shoulders and R infraspinatus and teres minor. TPR was provided to the infraspinatus. CFM to the R ant  GH  jt area Self Care: Pt Ed for pillow support for the UEs when sleeping supine or S/L and in sitting, cross fiction massage to the ant GH jt area, and tennis ball massage for the mid back scapular areas. Pt completed tennis ball massage on wall.                                                                                                                                       DATE: 12/22/22   PATIENT EDUCATION: Education details: PT, plan of care, home exercise program Person educated: Patient Education method: Explanation, Demonstration, and Handouts Education comprehension: verbalized understanding, returned demonstration, and needs further education  HOME EXERCISE PROGRAM: Access Code: [email or web address] URL: [email or web address] Date: 01/07/2023 Prepared by: Joellyn Rued  Exercises - Sidelying Shoulder Abduction with Resistance to 60 Degrees  - 1-2 x daily - 7 x weekly - 2 sets - 10 reps - 5 hold - Supine Shoulder Horizontal Abduction with Resistance  - 1-2 x daily - 7 x weekly - 2 sets - 10 reps - 5 hold - Shoulder External Rotation and Scapular Retraction with Resistance  - 1-2 x daily - 7 x weekly - 2 sets - 10 reps - 5 hold - Corner Pec Minor Stretch  - 1-2 x daily - 7 x weekly - 1 sets - 3 reps - 30 hold - Doorway Pec Stretch at 90 Degrees Abduction  - 1-2 x daily - 7 x weekly - 1 sets - 3 reps - 30 hold  ASSESSMENT:  CLINICAL IMPRESSION: STM revealed TPs of the upper traps and R infraspinatus. TPR to the R infraspinatus referred pain to the R lateral upper arm. Discussed the option of TPDN, which the pt is considering. Provided self care re: support of the arm in sitting or sleeping, the use of cross friction massage to the bilat ant GH jt areas, and tennis ball massage to the mid back and post R scapula to address increased muscle tension and pain modulation. Therex was then completed for anterior chest flexiblity and posterior chain strengthening o address postural issues.  HEP was updated. Pt returned proper demonstration of therex and tennis ball massage c verbal and tactile cueing. Pt tolerated PT today without adverse effects. Pt will continue to benefit from skilled PT to address impairments for improved shoulder function c minimized pain.  EVAL: Patient is a 57y.o. male who was seen today for physical therapy evaluation and treatment for bilateral shoulder pain.  Right sided shoulder and arm pain likely due to postural tightness.  Pain and numbness reduced when posture is corrected and cervical testing was normal  OBJECTIVE IMPAIRMENTS: decreased ROM, decreased strength, increased fascial restrictions, impaired sensation, impaired UE functional use, postural dysfunction, and pain.   ACTIVITY LIMITATIONS: carrying, lifting, sitting, and reach over head  PARTICIPATION LIMITATIONS: shopping, community activity, occupation, and yard work  PERSONAL FACTORS: Time since onset of injury/illness/exacerbation  and 1-2 comorbidities: Right sided biceps tear, chronic  are also affecting patient's functional outcome.   REHAB POTENTIAL: Excellent  CLINICAL DECISION MAKING: Stable/uncomplicated  EVALUATION COMPLEXITY: Low   GOALS: Goals reviewed with patient? Yes  SHORT TERM GOALS: Target date: 01/19/2023    Patient will be independent with home exercise program Baseline: Goal status: INITIAL  2.  Patient will sit with corrected posture and make changes to his use of technology to reduce tension on neck Baseline:  Goal status: INITIAL  3.  Balance screen will be completed Baseline:  Goal status: INITIAL   LONG TERM GOALS: Target date: 02/16/2023    Patient will be independent with home exercise program and gym exercises upon discharge Baseline:  Goal status: INITIAL  2.  Patient will be able to demonstrate normal active range of motion in bilateral shoulders without increased pain Baseline:  Goal status: INITIAL  3.  Bilateral shoulder strength  will be 5/5 in all planes in order to perform lifting and carrying activities Baseline:  Goal status: INITIAL  4.  Patient will be able to sleep on his right side without difficulty Baseline:  Goal status: INITIAL  5.  Foto score will improve to 76% Baseline: 66% Goal status: INITIAL  PLAN:  PT FREQUENCY: 2x/week  PT DURATION: 8 weeks6-8  PLANNED INTERVENTIONS: 97164- PT Re-evaluation, 97110-Therapeutic exercises, 97530- Therapeutic activity, 97112- Neuromuscular re-education, 97535- Self Care, 16109- Manual therapy, Patient/Family education, Balance training, Taping, Dry Needling, Joint mobilization, Cryotherapy, and Moist heat  PLAN FOR NEXT SESSION: Check home exercise program, posture scapular stabilization, UB E   Malayiah Mcbrayer MS, PT 01/07/23 10:50 AM

## 2023-01-07 ENCOUNTER — Ambulatory Visit: Payer: 59 | Attending: Sports Medicine

## 2023-01-07 DIAGNOSIS — R293 Abnormal posture: Secondary | ICD-10-CM | POA: Diagnosis not present

## 2023-01-07 DIAGNOSIS — G8929 Other chronic pain: Secondary | ICD-10-CM | POA: Diagnosis not present

## 2023-01-07 DIAGNOSIS — M25512 Pain in left shoulder: Secondary | ICD-10-CM | POA: Insufficient documentation

## 2023-01-07 DIAGNOSIS — M25511 Pain in right shoulder: Secondary | ICD-10-CM | POA: Insufficient documentation

## 2023-01-10 NOTE — Therapy (Unsigned)
OUTPATIENT PHYSICAL THERAPY SHOULDER TREATMENT   Patient Name: Nathan [name] MRN: [id number] DOB:13-Feb-1965, 57 y.o., male Today's Date: 01/11/2023  END OF SESSION:  PT End of Session - 01/11/23 1506     Visit Number 3    Number of Visits 16    Date for PT Re-Evaluation 02/16/23    Authorization Type [facility] [employer] Healthy blue until dec. 1    PT Start Time 1504    PT Stop Time 1545    PT Time Calculation (min) 41 min               Past Medical History:  Diagnosis Date   Allergies    09/16/2020   BPH (benign prostatic hyperplasia)    ED (erectile dysfunction)    GERD (gastroesophageal reflux disease)    Osteoarthritis    Peripheral focal chorioretinal inflammation of both eyes    Retinal vasculitis of both eyes    Sarcoidosis    Past Surgical History:  Procedure Laterality Date   BRONCHIAL NEEDLE ASPIRATION BIOPSY  10/18/2020   Procedure: BRONCHIAL NEEDLE ASPIRATION BIOPSIES;  Surgeon: Josephine Igo, DO;  [facility]: [facility];  Service: Pulmonary;;   BRONCHIAL WASHINGS  10/18/2020   Procedure: BRONCHIAL WASHINGS;  Surgeon: Josephine Igo, DO;  [facility]: [facility];  Service: Pulmonary;;   ICD IMPLANT N/A 10/28/2020   Procedure: ICD IMPLANT;  Surgeon: Lanier Prude, MD;  [facility];  Service: Cardiovascular;  Laterality: N/A;   RIGHT/LEFT HEART CATH AND CORONARY ANGIOGRAPHY N/A 10/16/2020   Procedure: RIGHT/LEFT HEART CATH AND CORONARY ANGIOGRAPHY;  Surgeon: Marykay Lex, MD;  Location: [facility] INVASIVE CV LAB;  Service: Cardiovascular;  Laterality: N/A;   VIDEO BRONCHOSCOPY WITH ENDOBRONCHIAL ULTRASOUND Bilateral 10/18/2020   Procedure: VIDEO BRONCHOSCOPY WITH ENDOBRONCHIAL ULTRASOUND;  Surgeon: Josephine Igo, DO;  [facility]: [facility];  Service: Pulmonary;  Laterality: Bilateral;   Patient Active Problem List   Diagnosis Date Noted   Biceps tendon tear 11/16/2022   Vitamin D deficiency 08/14/2022   High risk medication use  05/25/2022   Chronic systolic heart failure (HCC) 11/04/2020   ICD (implantable cardioverter-defibrillator) in place    Preoperative examination    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 Nadyne Coombes REFERRING PROVIDER: Madelyn Brunner, DO  REFERRING DIAG: [phone] (ICD-10-CM) - Chronic left shoulder pain M75.102 (ICD-10-CM) - Nontraumatic tear of supraspinatus tendon, left M25.511,G89.29 (ICD-10-CM) - Chronic right shoulder pain S46.219A (ICD-10-CM) - Biceps tendon tear  THERAPY DIAG:  Chronic pain of both shoulders  Abnormal posture  Rationale for Evaluation and Treatment: Rehabilitation  ONSET DATE: 12 to 18 months ago  SUBJECTIVE:  SUBJECTIVE STATEMENT: Pt reports his bilat shoulder pain is the same. Pt reports he is completing his HEP.  EVAL: Patient reports injuries over 12 months ago at separate times.  His right arm was injured about a year ago as he lifted a heavy piece of work equipment.  He had severe pain and later that night noticed a bulging bicep on that side but the pain did eventually resolve.  Sometime before that he lost his balance while walking and fell into the wall, caught himself with his left arm. Currently he reports right-sided anterior shoulder and arm pain which extends down the front of his right arm.  He reports the pain radiates from the side of his neck down to his right elbow.  Today that area is numb.  The left shoulder is painful in the front and top kind of generally.  He  reports he will have pain and crepitus when reaching certain ways.  He used to work out and performing a lateral raise on that left side was extremely painful.  He recently received a shot in the left shoulder and that has improved it quite a bit He currently has difficulty sleeping on his right side due to the pain.  He did have x-ray and ultrasound of each arm see below for results.  He has not had physical therapy before this time.     Hand dominance: Right  PERTINENT HISTORY: Sarcoidosis, defibrillator implant PAIN:  Are you having pain? Yes: NPRS scale: 3-4/10 Pain location: Rt UE ant  Pain description: numb and sore  Aggravating factors: positioning  Relieving factors: cream, changing positions helps   Yes: NPRS scale: 0/10 Pain location: Lt.UE ant  Pain description: sharp pain, aching Aggravating factors: reaching out to the side  Relieving factors: injection   PRECAUTIONS: None no electric stim due to defibrillator  RED FLAGS: None   WEIGHT BEARING RESTRICTIONS: No  FALLS:  Has patient fallen in last 6 months? No he does feel off balance sometimes.    LIVING ENVIRONMENT: Lives with: lives with their family mom with health issues .   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   PLOF: Independent, Vocation/Vocational requirements: plays every week, setting up and load/unload equipment, and Leisure: golf  He does all the cooking in the house work for his mother who is independent  with mobility  PATIENT GOALS:I want to be able to get stronger, lift weights and prevent surgery if possible.   NEXT MD VISIT: 6 weeks  OBJECTIVE:  Note: Objective measures were completed at Evaluation unless otherwise noted.  DIAGNOSTIC FINDINGS:  Partial tearing of the proximal supraspinatus tendon without full-thickness or retraction.     Biceps tendon shows no signs of  hypoechoic changes and tendon appears to be intact without any concern. AC joint does show some narrowing as well as some spurring coming off the distal clavicle, no significant effusion noted. The supraspinatus has some minor hypoechoic changes consistent with some small muscle tears in the proximal body of the muscle however the tendon insertion appears appropriate without any signs of fluid accumulation or tears. There is some mild swelling of the supraspinatus bursa. The infraspinatus has no signs of any hypoechoic changes, no tears noted of the tendon or the muscle belly. Insertion of the tendon is appropriate on its footplate.  3 view x-ray left shoulder AP, axial and Grashey view.  X-ray  shows appropriate joint space between the humeral head and the glenoid.  There is some narrowing of the AC joint, indicative of OA.  There is no cortical irregularities noted along the neck of the humerus.  There is some sclerotic changes of the humeral head on the lateral aspect of the greater tuberosity. No acute fracture.    PATIENT SURVEYS:  FOTO 66%  COGNITION: Overall cognitive status: Within functional limits for tasks assessed     SENSATION: Right upper extremity feels numb with certain positions and sitting  POSTURE: Forward head posture glenohumeral internal rotation humeral head sit anteriorly scapula a AB ducted from midline Right Popeye sign UPPER EXTREMITY ROM:   Active ROM Right eval Left eval  Shoulder flexion 155 150  Shoulder extension    Shoulder abduction 140 140  Shoulder adduction    Shoulder internal rotation Mid T  At 90 degrees of abduction, 80 T5 At 90 degrees of abduction, 60  Shoulder external rotation T3 At 90 degrees of abduction 75 T3, at 90 degrees of abduction 60 deg  Elbow flexion full Full  Elbow extension 0 0  Wrist flexion    Wrist extension    Wrist ulnar deviation    Wrist radial deviation    Wrist pronation    Wrist supination    (Blank rows = not  tested)  UPPER EXTREMITY MMT:  MMT Right eval Left eval  Shoulder flexion 4 4-  Shoulder extension    Shoulder abduction 4+ 4-  Shoulder adduction    Shoulder internal rotation 5 5  Shoulder external rotation 5 5  Middle trapezius    Lower trapezius    Elbow flexion 5 5  Elbow extension 5 5  Wrist flexion    Wrist extension    Wrist ulnar deviation    Wrist radial deviation    Wrist pronation    Wrist supination    Grip strength (lbs)    (Blank rows = not tested)  SHOULDER SPECIAL TESTS: Rotator cuff assessment: Empty can test: positive  Biceps assessment:  Normal strength  Cervical active range of motion Flexion 58 no pain Extension 55 no pain Right sidebending 47 Left sidebending 49 Right rotation within normal limits Left rotation lacks 10 to 15% end range  Negative Spurling test bilateral Suboccipital release, manual traction felt good to patient but he did not have numbness in his right arm at that time to compare.  JOINT MOBILITY TESTING:  Posterior capsule tightness  PALPATION:  Patient sore with gross palpation to bilateral anterior shoulders Soreness noted along bilateral posterior cervical muscles and tension in upper traps   TODAY'S TREATMENT:    [facility] Adult PT Treatment:                                                DATE: 01/11/23 Therapeutic Exercise: Supine overhead flexion with dowel x 10 full ROM  Cross body reaching x 10  Goal post ER/IR supine  Sidelying Shoulder Abduction with Resistance to 60 Degrees  Supine Shoulder Horizontal Abduction with Resistance   Shoulder External Rotation and Scapular Retraction with Resistance  Doorway Pec Stretch at 90 Degrees Abduction   ER 1 plate on freemotion x 15  Row 3 plates x 15  Horizontal abd 1 plate x 10  Upper trap and levator stretch  Sleeper stretch- next visit  Manual Therapy: Posterior  cervicals, light manual suboccipital release     [facility] Adult PT Treatment:                                                 DATE: 01/07/23 Therapeutic Exercise: Sidelying Shoulder Abduction with Resistance to 60 Degrees 2 sets - 10 reps - 5 hold Supine Shoulder Horizontal Abduction with Resistance 2 sets - 10 reps - 5 hold Shoulder External Rotation and Scapular Retraction with Resistance 2 sets - 10 reps - 5 hold Corner Pec Minor Stretch 3 reps - 30 hold Doorway Pec Stretch at 90 Degrees Abduction 3 reps - 30 Manual Therapy: STM to the bilat upper shoulders and R infraspinatus and teres minor. TPR was provided to the infraspinatus. CFM to the R ant GH jt area Self Care: Pt Ed for pillow support for the UEs when sleeping supine or S/L and in sitting, cross fiction massage to the ant GH jt area, and tennis ball massage for the mid back scapular areas. Pt completed tennis ball massage on wall.                                                                                                                                       DATE: 12/22/22   PATIENT EDUCATION: Education details: PT, plan of care, home exercise program Person educated: Patient Education method: Explanation, Demonstration, and Handouts Education comprehension: verbalized understanding, returned demonstration, and needs further education  HOME EXERCISE PROGRAM: Access Code: [email or web address] URL: [email or web address] Date: 01/07/2023 Prepared by: Joellyn Rued  Exercises - Sidelying Shoulder Abduction with Resistance to 60 Degrees  - 1-2 x daily - 7 x weekly - 2 sets - 10 reps - 5 hold - Supine Shoulder Horizontal Abduction with Resistance  - 1-2 x daily - 7 x weekly - 2 sets - 10 reps - 5 hold - Shoulder External Rotation and Scapular Retraction with Resistance  - 1-2 x daily - 7 x weekly - 2 sets - 10 reps - 5 hold - Corner Pec Minor Stretch  - 1-2 x daily - 7 x weekly - 1 sets - 3 reps - 30 hold - Doorway Pec Stretch at 90 Degrees Abduction  - 1-2 x daily - 7 x weekly - 1 sets - 3 reps - 30  hold  ASSESSMENT:  CLINICAL IMPRESSION: Patient able to work on postural strengthening and bilateral shoulder strength, scapular stability with min increase in L shoulder pain.  Rt UE numbness continues when sitting too long.  Manual to cervical spine reduced tension and Rt UE symptoms.  He will cont to benefit from skilled PT.  Given info on Tr Pt DN to consider for next visit.    OBJECTIVE IMPAIRMENTS: decreased ROM, decreased strength, increased fascial restrictions, impaired sensation, impaired UE functional  use, postural dysfunction, and pain.   ACTIVITY LIMITATIONS: carrying, lifting, sitting, and reach over head  PARTICIPATION LIMITATIONS: shopping, community activity, occupation, and yard work   PERSONAL FACTORS: Time since onset of injury/illness/exacerbation and 1-2 comorbidities: Right sided biceps tear, chronic  are also affecting patient's functional outcome.   REHAB POTENTIAL: Excellent  CLINICAL DECISION MAKING: Stable/uncomplicated  EVALUATION COMPLEXITY: Low   GOALS: Goals reviewed with patient? Yes  SHORT TERM GOALS: Target date: 01/19/2023    Patient will be independent with home exercise program Baseline: Goal status: met   2.  Patient will sit with corrected posture and make changes to his use of technology to reduce tension on neck Baseline:  Goal status: ongoing   3.  Balance screen will be completed if needed  Baseline:  Goal status: INITIAL   LONG TERM GOALS: Target date: 02/16/2023    Patient will be independent with home exercise program and gym exercises upon discharge Baseline:  Goal status: INITIAL  2.  Patient will be able to demonstrate normal active range of motion in bilateral shoulders without increased pain Baseline:  Goal status: INITIAL  3.  Bilateral shoulder strength will be 5/5 in all planes in order to perform lifting and carrying activities Baseline:  Goal status: INITIAL  4.  Patient will be able to sleep on his  right side without difficulty Baseline:  Goal status: INITIAL  5.  Foto score will improve to 76% Baseline: 66% Goal status: INITIAL  PLAN:  PT FREQUENCY: 2x/week  PT DURATION: 8 weeks6-8  PLANNED INTERVENTIONS: 97164- PT Re-evaluation, 97110-Therapeutic exercises, 97530- Therapeutic activity, 97112- Neuromuscular re-education, 97535- Self Care, 16109- Manual therapy, Patient/Family education, Balance training, Taping, Dry Needling, Joint mobilization, Cryotherapy, and Moist heat  PLAN FOR NEXT SESSION: ask about balance. Does he need screening? Check home exercise program, posture scapular stabilization, UBE Karie Mainland, PT 01/11/23 3:57 PM Phone: [phone] Fax: [phone]

## 2023-01-11 ENCOUNTER — Ambulatory Visit: Payer: 59 | Admitting: Physical Therapy

## 2023-01-11 ENCOUNTER — Ambulatory Visit: Payer: 59

## 2023-01-11 DIAGNOSIS — R293 Abnormal posture: Secondary | ICD-10-CM | POA: Diagnosis not present

## 2023-01-11 DIAGNOSIS — M25511 Pain in right shoulder: Secondary | ICD-10-CM | POA: Diagnosis not present

## 2023-01-11 DIAGNOSIS — M25512 Pain in left shoulder: Secondary | ICD-10-CM | POA: Diagnosis not present

## 2023-01-11 DIAGNOSIS — G8929 Other chronic pain: Secondary | ICD-10-CM | POA: Diagnosis not present

## 2023-01-12 ENCOUNTER — Ambulatory Visit (INDEPENDENT_AMBULATORY_CARE_PROVIDER_SITE_OTHER): Payer: BC Managed Care – PPO | Admitting: Sports Medicine

## 2023-01-12 ENCOUNTER — Encounter: Payer: Self-pay | Admitting: Sports Medicine

## 2023-01-12 ENCOUNTER — Telehealth: Payer: Self-pay

## 2023-01-12 DIAGNOSIS — M25511 Pain in right shoulder: Secondary | ICD-10-CM

## 2023-01-12 DIAGNOSIS — G8929 Other chronic pain: Secondary | ICD-10-CM

## 2023-01-12 DIAGNOSIS — M25512 Pain in left shoulder: Secondary | ICD-10-CM | POA: Diagnosis not present

## 2023-01-12 DIAGNOSIS — M75102 Unspecified rotator cuff tear or rupture of left shoulder, not specified as traumatic: Secondary | ICD-10-CM | POA: Diagnosis not present

## 2023-01-12 NOTE — Progress Notes (Signed)
Patient says that he is doing okay. He has been to physical therapy 3 times and does his exercises at home once a day. He does still have pain which he says is worse in the right shoulder, and his range of motion seems more restricted in the left shoulder.

## 2023-01-12 NOTE — Telephone Encounter (Signed)
Co-Pay card: Pending [employer] aware. (Email has been sent)

## 2023-01-12 NOTE — Progress Notes (Signed)
Nathan [name] - 57 y.o. male MRN [id number]  Date of birth: 1965/03/27  Office Visit Note: Visit Date: 01/12/2023 PCP: Dois Davenport, MD Referred by: Dois Davenport, MD  Subjective: Chief Complaint  Patient presents with   Left Shoulder - Follow-up   Right Shoulder - Follow-up   HPI: Nathan [name] is a pleasant 57 y.o. male who presents today for follow-up of bilateral chronic shoulder pain.  Left shoulder -recent ultrasound showed some partial tearing without retraction of the supraspinatus.  We did perform subacromial joint injection back on 12/01/2022.  This certainly did calm down his pain and his pain is not resolved but definitely better. Having some restriction in range of motion.  Right shoulder - has a notable Popeye deformity with likely prior proximal bicep tear.  His pain is more so deep within the shoulder joint.  Has had 3 sessions of formalized physical therapy and exercises once daily, finding benefit from these Taking additional treatment sessions to look for with greater improvement.  He is not taking any medication for pain and does not desire this.  Pertinent ROS were reviewed with the patient and found to be negative unless otherwise specified above in HPI.   Assessment & Plan: Visit Diagnoses:  1. Chronic left shoulder pain   2. Chronic right shoulder pain   3. Nontraumatic tear of supraspinatus tendon, left    Plan: Impression is bilateral shoulder pain with the left shoulder having a degree of rotator cuff arthropathy of the supraspinatus with ultrasound findings.  He did have improvement of his pain with subacromial joint injection, but needs further strengthening and range of motion through physical therapy.  He will continue PT for the right shoulder as well, he has a history of known proximal bicep tendon tear but more of this pain today is emanating from within the joint.  He actually does not have an x-ray of the right shoulder on file, we will  consider this at future visits.  He we will have him continue formal PT and transition to HEP over the next 4 to 6 weeks and I will see him around that time for reevaluation.  Could consider ultrasound-guided glenohumeral joint injection for the right shoulder in the future.  Follow-up: Return in about 5 weeks (around 02/16/2023) for for b/l shoulders (30-min for poss inj).   Meds & Orders: No orders of the defined types were placed in this encounter.  No orders of the defined types were placed in this encounter.    Procedures: No procedures performed      Clinical History: No specialty comments available.  He reports that he quit smoking about 12 years ago. His smoking use included cigarettes. He has never been exposed to tobacco smoke. He has never used smokeless tobacco. No results for input(s): "HGBA1C", "LABURIC" in the last 8760 hours.  Objective:    Physical Exam  Gen: Well-appearing, in no acute distress; non-toxic CV:  Well-perfused. Warm.  Resp: Breathing unlabored on room air; no wheezing. Psych: Fluid speech in conversation; appropriate affect; normal thought process Neuro: Sensation intact throughout. No gross coordination deficits.   Ortho Exam - Bilateral shoulders: Mild TTP at Codman's point of the left shoulder.  There is some pain with mild restriction in range of motion with abduction and as well as external rotation to only about 35-40 degrees compared to 55 degrees of the contralateral arm. + Positive empty can on the left.  To the right shoulder there is some  pain to palpation palpating deep within the glenohumeral joint both anteriorly and posteriorly.  Full active and passive range of motion, no rotator cuff weakness a Popeye deformity on the right with likely proximal bicep tendon tear with retraction.  Imaging: No results found.  Past Medical/Family/Surgical/Social History: Medications & Allergies reviewed per EMR, new medications updated. 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   Ventricular tachycardia, non-sustained (HCC) 10/21/2020   AKI (acute kidney injury) (HCC) 10/21/2020   Pleural effusion    Acute combined systolic and diastolic heart failure (HCC)    Sarcoidosis    Severe mitral regurgitation    Mediastinal lymphadenopathy 10/12/2020   Dyspnea 10/12/2020   Past Medical History:  Diagnosis Date   Allergies    09/16/2020   BPH (benign prostatic hyperplasia)    ED (erectile dysfunction)    GERD (gastroesophageal reflux disease)    Osteoarthritis    Peripheral focal chorioretinal inflammation of both eyes    Retinal vasculitis of both eyes    Sarcoidosis    Family History  Problem Relation Age of Onset   Stroke Mother    Chronic Renal Failure Mother    Dementia Mother    Cancer Father 58   Diabetes Father    Pancreatic cancer Father    Past Surgical History:  Procedure Laterality Date   BRONCHIAL NEEDLE ASPIRATION BIOPSY  10/18/2020   Procedure: BRONCHIAL NEEDLE ASPIRATION BIOPSIES;  Surgeon: Josephine Igo, DO;  Location: [facility];  Service: Pulmonary;;   BRONCHIAL WASHINGS  10/18/2020   Procedure: BRONCHIAL WASHINGS;  Surgeon: Josephine Igo, DO;  Location: [facility];  Service: Pulmonary;;   ICD IMPLANT N/A 10/28/2020   Procedure: ICD IMPLANT;  Surgeon: Lanier Prude, MD;  [facility]: [facility];  Service: Cardiovascular;  Laterality: N/A;   RIGHT/LEFT HEART CATH AND CORONARY ANGIOGRAPHY N/A 10/16/2020   Procedure: RIGHT/LEFT HEART CATH AND CORONARY ANGIOGRAPHY;  Surgeon: Marykay Lex, MD;  Location: [facility] INVASIVE CV LAB;  Service: Cardiovascular;  Laterality: N/A;   VIDEO BRONCHOSCOPY WITH ENDOBRONCHIAL  ULTRASOUND Bilateral 10/18/2020   Procedure: VIDEO BRONCHOSCOPY WITH ENDOBRONCHIAL ULTRASOUND;  Surgeon: Josephine Igo, DO;  Location: [facility];  Service: Pulmonary;  Laterality: Bilateral;   Social History   Occupational History   Not on file  Tobacco Use   Smoking status: Former    Current packs/day: 0.00    Types: Cigarettes    Quit date: 02/02/2010    Years since quitting: 12.9    Passive exposure: Never   Smokeless tobacco: Never  Vaping Use   Vaping status: Never Used  Substance and Sexual Activity   Alcohol use: Yes    Alcohol/week: 1.0 standard drink of alcohol    Types: 1 Cans of beer per week   Drug use: Not Currently   Sexual activity: Not on file

## 2023-01-12 NOTE — Telephone Encounter (Signed)
Auth Submission: APPROVED Site of care: Site of care: [facility] Payer: [employer] Medication & CPT/J Code(s) submitted: Avsola (infliximab-axxq) [id number] Route of submission (phone, fax, portal): phone/fax Phone # [phone]  Fax # [phone]  Auth type: Buy/Bill PB Units/visits requested: 3mg /kg x 6 doses Reference number: [id number] Approval from: 01/11/23 to 02/02/24

## 2023-01-12 NOTE — Therapy (Incomplete)
OUTPATIENT PHYSICAL THERAPY SHOULDER TREATMENT   Patient Name: Nathan [name] MRN: [id number] DOB:23-Jan-1966, 57 y.o., male Today's Date: 01/12/2023  END OF SESSION:      Past Medical History:  Diagnosis Date   Allergies    09/16/2020   BPH (benign prostatic hyperplasia)    ED (erectile dysfunction)    GERD (gastroesophageal reflux disease)    Osteoarthritis    Peripheral focal chorioretinal inflammation of both eyes    Retinal vasculitis of both eyes    Sarcoidosis    Past Surgical History:  Procedure Laterality Date   BRONCHIAL NEEDLE ASPIRATION BIOPSY  10/18/2020   Procedure: BRONCHIAL NEEDLE ASPIRATION BIOPSIES;  Surgeon: Josephine Igo, DO;  Location: [facility];  Service: Pulmonary;;   BRONCHIAL WASHINGS  10/18/2020   Procedure: BRONCHIAL WASHINGS;  Surgeon: Josephine Igo, DO;  Location: [facility];  Service: Pulmonary;;   ICD IMPLANT N/A 10/28/2020   Procedure: ICD IMPLANT;  Surgeon: Lanier Prude, MD;  Location: [facility];  Service: Cardiovascular;  Laterality: N/A;   RIGHT/LEFT HEART CATH AND CORONARY ANGIOGRAPHY N/A 10/16/2020   Procedure: RIGHT/LEFT HEART CATH AND CORONARY ANGIOGRAPHY;  Surgeon: Marykay Lex, MD;  Location: [facility] INVASIVE CV LAB;  Service: Cardiovascular;  Laterality: N/A;   VIDEO BRONCHOSCOPY WITH ENDOBRONCHIAL ULTRASOUND Bilateral 10/18/2020   Procedure: VIDEO BRONCHOSCOPY WITH ENDOBRONCHIAL ULTRASOUND;  Surgeon: Josephine Igo, DO;  Location: [facility];  Service: Pulmonary;  Laterality: Bilateral;   Patient Active Problem List   Diagnosis Date Noted   Biceps tendon tear 11/16/2022   Vitamin D deficiency 08/14/2022   High risk medication use 05/25/2022   Chronic systolic heart failure (HCC) 11/04/2020   ICD (implantable cardioverter-defibrillator) in place    Preoperative examination    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 Nadyne Coombes REFERRING PROVIDER: Madelyn Brunner, DO  REFERRING DIAG: [id number] (ICD-10-CM) - Chronic left shoulder pain M75.102 (ICD-10-CM) - Nontraumatic tear of supraspinatus tendon, left M25.511,G89.29 (ICD-10-CM) - Chronic right shoulder pain S46.219A (ICD-10-CM) - Biceps tendon tear  THERAPY DIAG:  No diagnosis found.  Rationale for Evaluation and Treatment: Rehabilitation  ONSET DATE: 12 to 18 months ago  SUBJECTIVE:  SUBJECTIVE STATEMENT: Pt reports his bilat shoulder pain is the same. Pt reports he is completing his HEP.  EVAL: Patient reports injuries over 12 months ago at separate times.  His right arm was injured about a year ago as he lifted a heavy piece of work equipment.  He had severe pain and later that night noticed a bulging bicep on that side but the pain did eventually resolve.  Sometime before that he lost his balance while walking and fell into the wall, caught himself with his left arm. Currently he reports right-sided anterior shoulder and arm pain which extends down the front of his right arm.  He reports the pain radiates from the side of his neck down to his right elbow.  Today that area is numb.  The left shoulder is painful in the front and top kind of generally.  He reports he will have pain and crepitus when reaching certain ways.  He used to work out and performing a lateral raise on that left side was extremely painful.  He recently received a shot in the left shoulder and that has improved it quite a bit He currently has difficulty sleeping on his right side due to  the pain.  He did have x-ray and ultrasound of each arm see below for results.  He has not had physical therapy before this time.     Hand dominance: Right  PERTINENT HISTORY: Sarcoidosis, defibrillator implant PAIN:  Are you having pain? Yes: NPRS scale: 3-4/10 Pain location: Rt UE ant  Pain description: numb and sore  Aggravating factors: positioning  Relieving factors: cream, changing positions helps   Yes: NPRS scale: 0/10 Pain location: Lt.UE ant  Pain description: sharp pain, aching Aggravating factors: reaching out to the side  Relieving factors: injection   PRECAUTIONS: None no electric stim due to defibrillator  RED FLAGS: None   WEIGHT BEARING RESTRICTIONS: No  FALLS:  Has patient fallen in last 6 months? No he does feel off balance sometimes.    LIVING ENVIRONMENT: Lives with: lives with their family mom with health issues .   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   PLOF: Independent, Vocation/Vocational requirements: plays every week, setting up and load/unload equipment, and Leisure: golf  He does all the cooking in the house work for his mother who is independent  with mobility  PATIENT GOALS:I want to be able to get stronger, lift weights and prevent surgery if possible.   NEXT MD VISIT: 6 weeks  OBJECTIVE:  Note: Objective measures were completed at Evaluation unless otherwise noted.  DIAGNOSTIC FINDINGS:  Partial tearing of the proximal supraspinatus tendon without full-thickness or retraction.     Biceps tendon shows no signs of hypoechoic changes and tendon appears to be intact without any concern. AC joint does show some narrowing as well as some spurring coming off the distal clavicle, no significant effusion noted. The supraspinatus has some minor hypoechoic changes consistent with some small muscle tears in the proximal body of  the muscle however the tendon insertion appears appropriate without any signs of fluid accumulation or tears. There is some mild swelling of the supraspinatus bursa. The infraspinatus has no signs of any hypoechoic changes, no tears noted of the tendon or the muscle belly. Insertion of the tendon is appropriate on its footplate.  3 view x-ray left shoulder AP, axial and Grashey view.  X-ray  shows appropriate joint space between the humeral head and the glenoid.  There is some narrowing of the AC joint, indicative of OA.  There is no cortical irregularities noted along the neck of the humerus.  There is some sclerotic changes of the humeral head on the lateral aspect of the greater tuberosity. No acute fracture.    PATIENT SURVEYS:  FOTO 66%  COGNITION: Overall cognitive status: Within functional limits for tasks assessed     SENSATION: Right upper extremity feels numb with certain positions and sitting  POSTURE: Forward head posture glenohumeral internal rotation humeral head sit anteriorly scapula a AB ducted from midline Right Popeye sign UPPER EXTREMITY ROM:   Active ROM Right eval Left eval  Shoulder flexion 155 150  Shoulder extension    Shoulder abduction 140 140  Shoulder adduction    Shoulder internal rotation Mid T  At 90 degrees of abduction, 80 T5 At 90 degrees of abduction, 60  Shoulder external rotation T3 At 90 degrees of abduction 75 T3, at 90 degrees of abduction 60 deg  Elbow flexion full Full  Elbow extension 0 0  Wrist flexion    Wrist extension    Wrist ulnar deviation    Wrist radial deviation    Wrist pronation    Wrist supination    (Blank rows = not tested)  UPPER EXTREMITY MMT:  MMT Right eval Left eval  Shoulder flexion 4 4-  Shoulder extension    Shoulder abduction 4+ 4-  Shoulder adduction    Shoulder internal rotation 5 5  Shoulder external rotation 5 5  Middle trapezius    Lower trapezius    Elbow flexion 5 5  Elbow extension 5 5   Wrist flexion    Wrist extension    Wrist ulnar deviation    Wrist radial deviation    Wrist pronation    Wrist supination    Grip strength (lbs)    (Blank rows = not tested)  SHOULDER SPECIAL TESTS: Rotator cuff assessment: Empty can test: positive  Biceps assessment:  Normal strength  Cervical active range of motion Flexion 58 no pain Extension 55 no pain Right sidebending 47 Left sidebending 49 Right rotation within normal limits Left rotation lacks 10 to 15% end range  Negative Spurling test bilateral Suboccipital release, manual traction felt good to patient but he did not have numbness in his right arm at that time to compare.  JOINT MOBILITY TESTING:  Posterior capsule tightness  PALPATION:  Patient sore with gross palpation to bilateral anterior shoulders Soreness noted along bilateral posterior cervical muscles and tension in upper traps   TODAY'S TREATMENT:   [facility] Adult PT Treatment:                                                DATE: 01/13/23 Therapeutic Exercise: Supine overhead flexion with dowel x 10 full ROM  Cross body reaching x 10  Goal post ER/IR supine  Sidelying Shoulder Abduction with Resistance to 60 Degrees  Supine Shoulder Horizontal Abduction with Resistance   Shoulder External Rotation and Scapular Retraction with Resistance  Doorway Pec Stretch at 90 Degrees Abduction   ER 1 plate on freemotion x 15  Row 3 plates x 15  Horizontal abd 1 plate x 10  Upper trap and levator stretch  Sleeper stretch- next visit  Manual Therapy: Posterior cervicals,  light manual suboccipital release   [facility] Adult PT Treatment:                                                DATE: 01/11/23 Therapeutic Exercise: Supine overhead flexion with dowel x 10 full ROM  Cross body reaching x 10  Goal post ER/IR supine  Sidelying Shoulder Abduction with Resistance to 60 Degrees  Supine Shoulder Horizontal Abduction with Resistance   Shoulder External Rotation and  Scapular Retraction with Resistance  Doorway Pec Stretch at 90 Degrees Abduction   ER 1 plate on freemotion x 15  Row 3 plates x 15  Horizontal abd 1 plate x 10  Upper trap and levator stretch  Sleeper stretch- next visit  Manual Therapy: Posterior cervicals, light manual suboccipital release     [facility] Adult PT Treatment:                                                DATE: 01/07/23 Therapeutic Exercise: Sidelying Shoulder Abduction with Resistance to 60 Degrees 2 sets - 10 reps - 5 hold Supine Shoulder Horizontal Abduction with Resistance 2 sets - 10 reps - 5 hold Shoulder External Rotation and Scapular Retraction with Resistance 2 sets - 10 reps - 5 hold Corner Pec Minor Stretch 3 reps - 30 hold Doorway Pec Stretch at 90 Degrees Abduction 3 reps - 30 Manual Therapy: STM to the bilat upper shoulders and R infraspinatus and teres minor. TPR was provided to the infraspinatus. CFM to the R ant GH jt area Self Care: Pt Ed for pillow support for the UEs when sleeping supine or S/L and in sitting, cross fiction massage to the ant GH jt area, and tennis ball massage for the mid back scapular areas. Pt completed tennis ball massage on wall.                                                                                                                                       DATE: 12/22/22   PATIENT EDUCATION: Education details: PT, plan of care, home exercise program Person educated: Patient Education method: Explanation, Demonstration, and Handouts Education comprehension: verbalized understanding, returned demonstration, and needs further education  HOME EXERCISE PROGRAM: Access Code: [email or web address] URL: [email or web address] Date: 01/07/2023 Prepared by: Joellyn Rued  Exercises - Sidelying Shoulder Abduction with Resistance to 60 Degrees  - 1-2 x daily - 7 x weekly - 2 sets - 10 reps - 5 hold - Supine Shoulder Horizontal Abduction with Resistance  - 1-2 x daily - 7 x weekly -  2 sets - 10 reps - 5 hold - Shoulder External Rotation and Scapular Retraction  with Resistance  - 1-2 x daily - 7 x weekly - 2 sets - 10 reps - 5 hold - Corner Pec Minor Stretch  - 1-2 x daily - 7 x weekly - 1 sets - 3 reps - 30 hold - Doorway Pec Stretch at 90 Degrees Abduction  - 1-2 x daily - 7 x weekly - 1 sets - 3 reps - 30 hold  ASSESSMENT:  CLINICAL IMPRESSION: Patient able to work on postural strengthening and bilateral shoulder strength, scapular stability with min increase in L shoulder pain.  Rt UE numbness continues when sitting too long.  Manual to cervical spine reduced tension and Rt UE symptoms.  He will cont to benefit from skilled PT.  Given info on Tr Pt DN to consider for next visit.    OBJECTIVE IMPAIRMENTS: decreased ROM, decreased strength, increased fascial restrictions, impaired sensation, impaired UE functional use, postural dysfunction, and pain.   ACTIVITY LIMITATIONS: carrying, lifting, sitting, and reach over head  PARTICIPATION LIMITATIONS: shopping, community activity, occupation, and yard work   PERSONAL FACTORS: Time since onset of injury/illness/exacerbation and 1-2 comorbidities: Right sided biceps tear, chronic  are also affecting patient's functional outcome.   REHAB POTENTIAL: Excellent  CLINICAL DECISION MAKING: Stable/uncomplicated  EVALUATION COMPLEXITY: Low   GOALS: Goals reviewed with patient? Yes  SHORT TERM GOALS: Target date: 01/19/2023    Patient will be independent with home exercise program Baseline: Goal status: met   2.  Patient will sit with corrected posture and make changes to his use of technology to reduce tension on neck Baseline:  Goal status: ongoing   3.  Balance screen will be completed if needed  Baseline:  Goal status: INITIAL   LONG TERM GOALS: Target date: 02/16/2023    Patient will be independent with home exercise program and gym exercises upon discharge Baseline:  Goal status: INITIAL  2.  Patient  will be able to demonstrate normal active range of motion in bilateral shoulders without increased pain Baseline:  Goal status: INITIAL  3.  Bilateral shoulder strength will be 5/5 in all planes in order to perform lifting and carrying activities Baseline:  Goal status: INITIAL  4.  Patient will be able to sleep on his right side without difficulty Baseline:  Goal status: INITIAL  5.  Foto score will improve to 76% Baseline: 66% Goal status: INITIAL  PLAN:  PT FREQUENCY: 2x/week  PT DURATION: 8 weeks6-8  PLANNED INTERVENTIONS: 97164- PT Re-evaluation, 97110-Therapeutic exercises, 97530- Therapeutic activity, 97112- Neuromuscular re-education, 97535- Self Care, 95621- Manual therapy, Patient/Family education, Balance training, Taping, Dry Needling, Joint mobilization, Cryotherapy, and Moist heat  PLAN FOR NEXT SESSION: ask about balance. Does he need screening? Check home exercise program, posture scapular stabilization, UBE  Bobi Daudelin MS, PT 01/12/23 6:12 AM

## 2023-01-13 ENCOUNTER — Ambulatory Visit: Payer: 59

## 2023-01-13 DIAGNOSIS — M25512 Pain in left shoulder: Secondary | ICD-10-CM | POA: Diagnosis not present

## 2023-01-13 DIAGNOSIS — R293 Abnormal posture: Secondary | ICD-10-CM

## 2023-01-13 DIAGNOSIS — M25511 Pain in right shoulder: Secondary | ICD-10-CM | POA: Diagnosis not present

## 2023-01-13 DIAGNOSIS — G8929 Other chronic pain: Secondary | ICD-10-CM

## 2023-01-13 NOTE — Patient Instructions (Signed)

## 2023-01-13 NOTE — Therapy (Signed)
OUTPATIENT PHYSICAL THERAPY SHOULDER TREATMENT   Patient Name: Nathan [name] MRN: [id number] DOB:[birth year], 57 y.o., male Today's Date: 01/13/2023  END OF SESSION:  PT End of Session - 01/13/23 1134     Visit Number 4    Number of Visits 16    Date for PT Re-Evaluation 02/16/23    Authorization Type [facility] [employer] until dec. 1    PT Start Time 1100    PT Stop Time 1145    PT Time Calculation (min) 45 min    Activity Tolerance Patient tolerated treatment well    Behavior During Therapy WFL for tasks assessed/performed                Past Medical History:  Diagnosis Date   Allergies    09/16/2020   BPH (benign prostatic hyperplasia)    ED (erectile dysfunction)    GERD (gastroesophageal reflux disease)    Osteoarthritis    Peripheral focal chorioretinal inflammation of both eyes    Retinal vasculitis of both eyes    Sarcoidosis    Past Surgical History:  Procedure Laterality Date   BRONCHIAL NEEDLE ASPIRATION BIOPSY  10/18/2020   Procedure: BRONCHIAL NEEDLE ASPIRATION BIOPSIES;  Surgeon: Josephine Igo, DO;  Location: [facility];  Service: Pulmonary;;   BRONCHIAL WASHINGS  10/18/2020   Procedure: BRONCHIAL WASHINGS;  Surgeon: Josephine Igo, DO;  Location: [facility];  Service: Pulmonary;;   ICD IMPLANT N/A 10/28/2020   Procedure: ICD IMPLANT;  Surgeon: Lanier Prude, MD;  Location: [facility];  Service: Cardiovascular;  Laterality: N/A;   RIGHT/LEFT HEART CATH AND CORONARY ANGIOGRAPHY N/A 10/16/2020   Procedure: RIGHT/LEFT HEART CATH AND CORONARY ANGIOGRAPHY;  Surgeon: Marykay Lex, MD;  Location: [facility] INVASIVE CV LAB;  Service: Cardiovascular;  Laterality: N/A;   VIDEO BRONCHOSCOPY WITH ENDOBRONCHIAL ULTRASOUND Bilateral 10/18/2020   Procedure: VIDEO BRONCHOSCOPY WITH ENDOBRONCHIAL ULTRASOUND;  Surgeon: Josephine Igo, DO;  Location: [facility];  Service: Pulmonary;  Laterality: Bilateral;   Patient Active Problem List    Diagnosis Date Noted   Biceps tendon tear 11/16/2022   Vitamin D deficiency 08/14/2022   High risk medication use 05/25/2022   Chronic systolic heart failure (HCC) 11/04/2020   ICD (implantable cardioverter-defibrillator) in place    Preoperative examination    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 Nadyne Coombes REFERRING PROVIDER: Madelyn Brunner, DO  REFERRING DIAG: [phone] (ICD-10-CM) - Chronic left shoulder pain M75.102 (ICD-10-CM) - Nontraumatic tear of supraspinatus tendon, left M25.511,G89.29 (ICD-10-CM) - Chronic right shoulder pain S46.219A (ICD-10-CM) - Biceps tendon tear  THERAPY DIAG:  Chronic pain of both shoulders  Abnormal posture  Rationale for Evaluation and Treatment: Rehabilitation  ONSET DATE: 12 to 18 months ago  SUBJECTIVE:  SUBJECTIVE STATEMENT: Pt reports his bilat shoulder pain is better. He reports starting to complete incline push ups. Pt notes he joined a gym yesterday.  EVAL: Patient reports injuries over 12 months ago at separate times.  His right arm was injured about a year ago as he lifted a heavy piece of work equipment.  He had severe pain and later that night noticed a bulging bicep on that side but the pain did eventually resolve.  Sometime before that he lost his balance while walking and fell into the wall, caught himself with his left arm. Currently he reports right-sided anterior shoulder and arm pain which extends down the front of his right arm.  He reports the  pain radiates from the side of his neck down to his right elbow.  Today that area is numb.  The left shoulder is painful in the front and top kind of generally.  He reports he will have pain and crepitus when reaching certain ways.  He used to work out and performing a lateral raise on that left side was extremely painful.  He recently received a shot in the left shoulder and that has improved it quite a bit He currently has difficulty sleeping on his right side due to the pain.  He did have x-ray and ultrasound of each arm see below for results.  He has not had physical therapy before this time.   Hand dominance: Right  PERTINENT HISTORY: Sarcoidosis, defibrillator implant PAIN:  Are you having pain? Yes: NPRS scale: 2-3/10 Pain location: Rt UE ant  Pain description: numb and sore  Aggravating factors: positioning  Relieving factors: cream, changing positions helps   Yes: NPRS scale: 0/10 Pain location: Lt.UE ant  Pain description: sharp pain, aching Aggravating factors: reaching out to the side  Relieving factors: injection   PRECAUTIONS: None no electric stim due to defibrillator  RED FLAGS: None   WEIGHT BEARING RESTRICTIONS: No  FALLS:  Has patient fallen in last 6 months? No he does feel off balance sometimes.    LIVING ENVIRONMENT: Lives with: lives with their family mom with health issues .   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   PLOF: Independent, Vocation/Vocational requirements: plays every week, setting up and load/unload equipment, and Leisure: golf  He does all the cooking in the house work for his mother who is independent  with mobility  PATIENT GOALS:I want to be able to get stronger, lift weights and prevent surgery if possible.   NEXT MD VISIT: 6 weeks  OBJECTIVE:  Note: Objective measures were completed at Evaluation unless otherwise  noted.  DIAGNOSTIC FINDINGS:  Partial tearing of the proximal supraspinatus tendon without full-thickness or retraction.    Biceps tendon shows no signs of hypoechoic changes and tendon appears to be intact without any concern. AC joint does show some narrowing as well as some spurring coming off the distal clavicle, no significant effusion noted. The supraspinatus has some minor hypoechoic changes consistent with some small muscle tears in the proximal body of the muscle however the tendon insertion appears appropriate without any signs of fluid accumulation or tears. There is some mild swelling of the supraspinatus bursa. The infraspinatus has no signs of any hypoechoic changes, no tears noted of the tendon or the muscle belly. Insertion of the tendon is appropriate on its footplate.  3 view x-ray left shoulder AP, axial and  Grashey view.  X-ray shows appropriate joint space between the humeral head and the glenoid.  There is some narrowing of the AC joint, indicative of OA.  There is no cortical irregularities noted along the neck of the humerus.  There is some sclerotic changes of the humeral head on the lateral aspect of the greater tuberosity. No acute fracture.    PATIENT SURVEYS:  FOTO 66%  COGNITION: Overall cognitive status: Within functional limits for tasks assessed     SENSATION: Right upper extremity feels numb with certain positions and sitting  POSTURE: Forward head posture glenohumeral internal rotation humeral head sit anteriorly scapula a AB ducted from midline Right Popeye sign UPPER EXTREMITY ROM:   Active ROM Right eval Left eval  Shoulder flexion 155 150  Shoulder extension    Shoulder abduction 140 140  Shoulder adduction    Shoulder internal rotation Mid T  At 90 degrees of abduction, 80 T5 At 90 degrees of abduction, 60  Shoulder external rotation T3 At 90 degrees of abduction 75 T3, at 90 degrees of abduction 60 deg  Elbow flexion full Full  Elbow  extension 0 0  Wrist flexion    Wrist extension    Wrist ulnar deviation    Wrist radial deviation    Wrist pronation    Wrist supination    (Blank rows = not tested)  UPPER EXTREMITY MMT:  MMT Right eval Left eval  Shoulder flexion 4 4-  Shoulder extension    Shoulder abduction 4+ 4-  Shoulder adduction    Shoulder internal rotation 5 5  Shoulder external rotation 5 5  Middle trapezius    Lower trapezius    Elbow flexion 5 5  Elbow extension 5 5  Wrist flexion    Wrist extension    Wrist ulnar deviation    Wrist radial deviation    Wrist pronation    Wrist supination    Grip strength (lbs)    (Blank rows = not tested)  SHOULDER SPECIAL TESTS: Rotator cuff assessment: Empty can test: positive  Biceps assessment:  Normal strength  Cervical active range of motion Flexion 58 no pain Extension 55 no pain Right sidebending 47 Left sidebending 49 Right rotation within normal limits Left rotation lacks 10 to 15% end range  Negative Spurling test bilateral Suboccipital release, manual traction felt good to patient but he did not have numbness in his right arm at that time to compare.  JOINT MOBILITY TESTING:  Posterior capsule tightness  PALPATION:  Patient sore with gross palpation to bilateral anterior shoulders Soreness noted along bilateral posterior cervical muscles and tension in upper traps   TODAY'S TREATMENT:   [facility] Adult PT Treatment:                                                DATE: 01/13/23 Therapeutic Exercise: Omega shoulder rows 3x10 55# Omega lat pulls 3x10 55# Omega chest press c plus one 3x10 65# Standing bilat ER BluTB 3x10 Corner Pec Minor Stretch 2x - 30 hold Doorway Pec Stretch at 90 Degrees Abduction  2x- 30 hold R upper trap stretch 2x 15" Manual Therapy: STM to the R upper trap and infraspinatus  Skilled palpation to identify TrPs and taut muscle bands Trigger Point Dry Needling Treatment: Pre-treatment instruction: Patient  instructed on dry needling rationale, procedures, and possible side effects including  pain during treatment (achy,cramping feeling), bruising, drop of blood, lightheadedness, nausea, sweating. Patient Consent Given: Yes Education handout provided: Yes Muscles treated: R upper trap and infraspinatus  Needle size and number: .30x19mm x 2 Electrical stimulation performed: No Parameters: N/A Treatment response/outcome: Twitch response elicited Post-treatment instructions: Patient instructed to expect possible mild to moderate muscle soreness later today and/or tomorrow. Patient instructed in methods to reduce muscle soreness and to continue prescribed HEP. If patient was dry needled over the lung field, patient was instructed on signs and symptoms of pneumothorax and, however unlikely, to see immediate medical attention should they occur. Patient was also educated on signs and symptoms of infection and to seek medical attention should they occur. Patient verbalized understanding of these instructions and education.    [facility] Adult PT Treatment:                                                DATE: 01/11/23 Therapeutic Exercise: Supine overhead flexion with dowel x 10 full ROM  Cross body reaching x 10  Goal post ER/IR supine  Sidelying Shoulder Abduction with Resistance to 60 Degrees  Supine Shoulder Horizontal Abduction with Resistance   Shoulder External Rotation and Scapular Retraction with Resistance  Doorway Pec Stretch at 90 Degrees Abduction   ER 1 plate on freemotion x 15  Row 3 plates x 15  Horizontal abd 1 plate x 10  Upper trap and levator stretch  Sleeper stretch- next visit  Manual Therapy: Posterior cervicals, light manual suboccipital release     [facility] Adult PT Treatment:                                                DATE: 01/07/23 Therapeutic Exercise: Sidelying Shoulder Abduction with Resistance to 60 Degrees 2 sets - 10 reps - 5 hold Supine Shoulder Horizontal Abduction with  Resistance 2 sets - 10 reps - 5 hold Shoulder External Rotation and Scapular Retraction with Resistance 2 sets - 10 reps - 5 hold Corner Pec Minor Stretch 3 reps - 30 hold Doorway Pec Stretch at 90 Degrees Abduction 3 reps - 30 Manual Therapy: STM to the bilat upper shoulders and R infraspinatus and teres minor. TPR was provided to the infraspinatus. CFM to the R ant GH jt area Self Care: Pt Ed for pillow support for the UEs when sleeping supine or S/L and in sitting, cross fiction massage to the ant GH jt area, and tennis ball massage for the mid back scapular areas. Pt completed tennis ball massage on wall.  DATE: 12/22/22   PATIENT EDUCATION: Education details: PT, plan of care, home exercise program Person educated: Patient Education method: Explanation, Demonstration, and Handouts Education comprehension: verbalized understanding, returned demonstration, and needs further education  HOME EXERCISE PROGRAM: Access Code: [email or web address] URL: [email or web address] Date: 01/07/2023 Prepared by: Joellyn Rued  Exercises - Sidelying Shoulder Abduction with Resistance to 60 Degrees  - 1-2 x daily - 7 x weekly - 2 sets - 10 reps - 5 hold - Supine Shoulder Horizontal Abduction with Resistance  - 1-2 x daily - 7 x weekly - 2 sets - 10 reps - 5 hold - Shoulder External Rotation and Scapular Retraction with Resistance  - 1-2 x daily - 7 x weekly - 2 sets - 10 reps - 5 hold - Corner Pec Minor Stretch  - 1-2 x daily - 7 x weekly - 1 sets - 3 reps - 30 hold - Doorway Pec Stretch at 90 Degrees Abduction  - 1-2 x daily - 7 x weekly - 1 sets - 3 reps - 30 hold  ASSESSMENT:  CLINICAL IMPRESSION: PT was completed for manual therapy to the R upper trap and infraspinatus f/b TPDN to these muscles. Twitch responses were elicited. TPDN was followed by upper body  stretching and strengthening therex for muscle activation. Several of the therex were completed on equipment the pt may encounter at a gym. Pt tolerated PT today without adverse effects. Will assess pt's response to the TPDN his next PT visit. Pt will continue to benefit from skilled PT to address impairments for improved shulder function with minimized pain.  OBJECTIVE IMPAIRMENTS: decreased ROM, decreased strength, increased fascial restrictions, impaired sensation, impaired UE functional use, postural dysfunction, and pain.   ACTIVITY LIMITATIONS: carrying, lifting, sitting, and reach over head  PARTICIPATION LIMITATIONS: shopping, community activity, occupation, and yard work   PERSONAL FACTORS: Time since onset of injury/illness/exacerbation and 1-2 comorbidities: Right sided biceps tear, chronic  are also affecting patient's functional outcome.   REHAB POTENTIAL: Excellent  CLINICAL DECISION MAKING: Stable/uncomplicated  EVALUATION COMPLEXITY: Low   GOALS: Goals reviewed with patient? Yes  SHORT TERM GOALS: Target date: 01/19/2023    Patient will be independent with home exercise program Baseline: Goal status: met   2.  Patient will sit with corrected posture and make changes to his use of technology to reduce tension on neck Baseline:  Goal status: ongoing   3.  Balance screen will be completed if needed  Baseline:  Goal status: INITIAL   LONG TERM GOALS: Target date: 02/16/2023    Patient will be independent with home exercise program and gym exercises upon discharge Baseline:  Goal status: INITIAL  2.  Patient will be able to demonstrate normal active range of motion in bilateral shoulders without increased pain Baseline:  Goal status: INITIAL  3.  Bilateral shoulder strength will be 5/5 in all planes in order to perform lifting and carrying activities Baseline:  Goal status: INITIAL  4.  Patient will be able to sleep on his right side without  difficulty Baseline:  Goal status: INITIAL  5.  Foto score will improve to 76% Baseline: 66% Goal status: INITIAL  PLAN:  PT FREQUENCY: 2x/week  PT DURATION: 8 weeks6-8  PLANNED INTERVENTIONS: 97164- PT Re-evaluation, 97110-Therapeutic exercises, 97530- Therapeutic activity, 97112- Neuromuscular re-education, 97535- Self Care, 16109- Manual therapy, Patient/Family education, Balance training, Taping, Dry Needling, Joint mobilization, Cryotherapy, and Moist heat  PLAN FOR NEXT SESSION: ask about balance. Does he need screening? Check home exercise program, posture  scapular stabilization, UBE  Tyreece Gelles MS, PT 01/13/23 2:55 PM

## 2023-01-18 ENCOUNTER — Ambulatory Visit (INDEPENDENT_AMBULATORY_CARE_PROVIDER_SITE_OTHER): Payer: 59

## 2023-01-18 ENCOUNTER — Ambulatory Visit: Payer: 59 | Admitting: Physical Therapy

## 2023-01-18 VITALS — BP 99/64 | HR 71 | Temp 98.4°F | Resp 18 | Ht 68.0 in | Wt 219.6 lb

## 2023-01-18 DIAGNOSIS — D8685 Sarcoid myocarditis: Secondary | ICD-10-CM | POA: Diagnosis not present

## 2023-01-18 DIAGNOSIS — Z79899 Other long term (current) drug therapy: Secondary | ICD-10-CM | POA: Diagnosis not present

## 2023-01-18 DIAGNOSIS — D869 Sarcoidosis, unspecified: Secondary | ICD-10-CM

## 2023-01-18 MED ORDER — SODIUM CHLORIDE 0.9 % IV SOLN
3.0000 mg/kg | Freq: Once | INTRAVENOUS | Status: AC
  Administered 2023-01-18: 300 mg via INTRAVENOUS
  Filled 2023-01-18: qty 30

## 2023-01-18 MED ORDER — DIPHENHYDRAMINE HCL 25 MG PO CAPS
25.0000 mg | ORAL_CAPSULE | Freq: Once | ORAL | Status: AC
  Administered 2023-01-18: 25 mg via ORAL
  Filled 2023-01-18: qty 1

## 2023-01-18 MED ORDER — METHYLPREDNISOLONE SODIUM SUCC 40 MG IJ SOLR
40.0000 mg | Freq: Once | INTRAMUSCULAR | Status: AC
  Administered 2023-01-18: 40 mg via INTRAVENOUS
  Filled 2023-01-18: qty 1

## 2023-01-18 MED ORDER — ACETAMINOPHEN 325 MG PO TABS
650.0000 mg | ORAL_TABLET | Freq: Once | ORAL | Status: AC
Start: 2023-01-18 — End: 2023-01-18
  Administered 2023-01-18: 650 mg via ORAL
  Filled 2023-01-18: qty 2

## 2023-01-18 NOTE — Progress Notes (Signed)
Diagnosis: Cardiac sarcoidosis [   Provider:  Chilton Greathouse MD  Procedure: IV Infusion  IV Type: Peripheral, IV Location: L Upper Arm  Avsola (infliximab-axxq), Dose: 300 mg  Infusion Start Time: 1146  Infusion Stop Time: 1400  Post Infusion IV Care: Peripheral IV Discontinued  Discharge: Condition: Good, Destination: Home . AVS Declined  Performed by:  Rico Ala, LPN

## 2023-01-19 DIAGNOSIS — H2513 Age-related nuclear cataract, bilateral: Secondary | ICD-10-CM | POA: Diagnosis not present

## 2023-01-20 ENCOUNTER — Encounter: Payer: Self-pay | Admitting: Physical Therapy

## 2023-01-20 ENCOUNTER — Other Ambulatory Visit (HOSPITAL_COMMUNITY): Payer: Self-pay

## 2023-01-20 ENCOUNTER — Ambulatory Visit: Payer: 59 | Admitting: Physical Therapy

## 2023-01-20 DIAGNOSIS — R293 Abnormal posture: Secondary | ICD-10-CM

## 2023-01-20 DIAGNOSIS — M25511 Pain in right shoulder: Secondary | ICD-10-CM | POA: Diagnosis not present

## 2023-01-20 DIAGNOSIS — M25512 Pain in left shoulder: Secondary | ICD-10-CM | POA: Diagnosis not present

## 2023-01-20 DIAGNOSIS — G8929 Other chronic pain: Secondary | ICD-10-CM | POA: Diagnosis not present

## 2023-01-20 NOTE — Therapy (Signed)
OUTPATIENT PHYSICAL THERAPY SHOULDER TREATMENT   Patient Name: Nathan [name] MRN: [id number] DOB:[birth year], 57 y.o., male Today's Date: 01/20/2023  END OF SESSION:  PT End of Session - 01/20/23 1053     Visit Number 5    Number of Visits 16    Date for PT Re-Evaluation 02/16/23    Authorization Type [facility] [employer] blue until dec. 1    PT Start Time 1100    PT Stop Time 1138    PT Time Calculation (min) 38 min                Past Medical History:  Diagnosis Date   Allergies    09/16/2020   BPH (benign prostatic hyperplasia)    ED (erectile dysfunction)    GERD (gastroesophageal reflux disease)    Osteoarthritis    Peripheral focal chorioretinal inflammation of both eyes    Retinal vasculitis of both eyes    Sarcoidosis    Past Surgical History:  Procedure Laterality Date   BRONCHIAL NEEDLE ASPIRATION BIOPSY  10/18/2020   Procedure: BRONCHIAL NEEDLE ASPIRATION BIOPSIES;  Surgeon: Josephine Igo, DO;  Location: [facility];  Service: Pulmonary;;   BRONCHIAL WASHINGS  10/18/2020   Procedure: BRONCHIAL WASHINGS;  Surgeon: Josephine Igo, DO;  Location: [facility];  Service: Pulmonary;;   ICD IMPLANT N/A 10/28/2020   Procedure: ICD IMPLANT;  Surgeon: Lanier Prude, MD;  Location: [facility];  Service: Cardiovascular;  Laterality: N/A;   RIGHT/LEFT HEART CATH AND CORONARY ANGIOGRAPHY N/A 10/16/2020   Procedure: RIGHT/LEFT HEART CATH AND CORONARY ANGIOGRAPHY;  Surgeon: Marykay Lex, MD;  Location: [facility] INVASIVE CV LAB;  Service: Cardiovascular;  Laterality: N/A;   VIDEO BRONCHOSCOPY WITH ENDOBRONCHIAL ULTRASOUND Bilateral 10/18/2020   Procedure: VIDEO BRONCHOSCOPY WITH ENDOBRONCHIAL ULTRASOUND;  Surgeon: Josephine Igo, DO;  Location: [facility];  Service: Pulmonary;  Laterality: Bilateral;   Patient Active Problem List   Diagnosis Date Noted   Biceps tendon tear 11/16/2022   Vitamin D deficiency 08/14/2022   High risk medication use  05/25/2022   Chronic systolic heart failure (HCC) 11/04/2020   ICD (implantable cardioverter-defibrillator) in place    Preoperative examination    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 Nadyne Coombes REFERRING PROVIDER: Madelyn Brunner, DO  REFERRING DIAG: [phone] (ICD-10-CM) - Chronic left shoulder pain M75.102 (ICD-10-CM) - Nontraumatic tear of supraspinatus tendon, left M25.511,G89.29 (ICD-10-CM) - Chronic right shoulder pain S46.219A (ICD-10-CM) - Biceps tendon tear  THERAPY DIAG:  Chronic pain of both shoulders  Abnormal posture  Rationale for Evaluation and Treatment: Rehabilitation  ONSET DATE: 12 to 18 months ago  SUBJECTIVE:  SUBJECTIVE STATEMENT: The left shoulder is a lot better but the right shoulder is still painful.    Pt reports his bilat shoulder pain is better. He reports starting to complete incline push ups. Pt notes he joined a gym yesterday.  EVAL: Patient reports injuries over 12 months ago at separate times.  His right arm was injured about a year ago as he lifted a heavy piece of work equipment.  He had severe pain and later that night noticed a bulging bicep on that side but the pain did eventually resolve.  Sometime before that he lost his balance while walking and fell into the wall, caught himself with his left arm. Currently he reports right-sided anterior shoulder and arm pain which extends down the front of his right arm.  He reports the pain radiates from the side of his neck  down to his right elbow.  Today that area is numb.  The left shoulder is painful in the front and top kind of generally.  He reports he will have pain and crepitus when reaching certain ways.  He used to work out and performing a lateral raise on that left side was extremely painful.  He recently received a shot in the left shoulder and that has improved it quite a bit He currently has difficulty sleeping on his right side due to the pain.  He did have x-ray and ultrasound of each arm see below for results.  He has not had physical therapy before this time.   Hand dominance: Right  PERTINENT HISTORY: Sarcoidosis, defibrillator implant PAIN:  Are you having pain? Yes: NPRS scale: 2/10 Pain location: Rt superior shoulder  Pain description: numb and sore  Aggravating factors: positioning  Relieving factors: cream, changing positions helps   Yes: NPRS scale: 0/10 Pain location: Lt.UE ant  Pain description: sharp pain, aching Aggravating factors: reaching out to the side  Relieving factors: injection   PRECAUTIONS: None no electric stim due to defibrillator  RED FLAGS: None   WEIGHT BEARING RESTRICTIONS: No  FALLS:  Has patient fallen in last 6 months? No he does feel off balance sometimes.    LIVING ENVIRONMENT: Lives with: lives with their family mom with health issues .   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   PLOF: Independent, Vocation/Vocational requirements: plays every week, setting up and load/unload equipment, and Leisure: golf  He does all the cooking in the house work for his mother who is independent  with mobility  PATIENT GOALS:I want to be able to get stronger, lift weights and prevent surgery if possible.   NEXT MD VISIT: 6 weeks  OBJECTIVE:  Note: Objective measures were completed at Evaluation unless otherwise noted.  DIAGNOSTIC FINDINGS:   Partial tearing of the proximal supraspinatus tendon without full-thickness or retraction.    Biceps tendon shows no signs of hypoechoic changes and tendon appears to be intact without any concern. AC joint does show some narrowing as well as some spurring coming off the distal clavicle, no significant effusion noted. The supraspinatus has some minor hypoechoic changes consistent with some small muscle tears in the proximal body of the muscle however the tendon insertion appears appropriate without any signs of fluid accumulation or tears. There is some mild swelling of the supraspinatus bursa. The infraspinatus has no signs of any hypoechoic changes, no tears noted of the tendon or the muscle belly. Insertion  of the tendon is appropriate on its footplate.  3 view x-ray left shoulder AP, axial and Grashey view.  X-ray shows appropriate joint space between the humeral head and the glenoid.  There is some narrowing of the AC joint, indicative of OA.  There is no cortical irregularities noted along the neck of the humerus.  There is some sclerotic changes of the humeral head on the lateral aspect of the greater tuberosity. No acute fracture.    PATIENT SURVEYS:  FOTO 66%  COGNITION: Overall cognitive status: Within functional limits for tasks assessed     SENSATION: Right upper extremity feels numb with certain positions and sitting  POSTURE: Forward head posture glenohumeral internal rotation humeral head sit anteriorly scapula a AB ducted from midline Right Popeye sign UPPER EXTREMITY ROM:   Active ROM Right eval Left eval  Shoulder flexion 155 150  Shoulder extension    Shoulder abduction 140 140  Shoulder adduction    Shoulder internal rotation Mid T  At 90 degrees of abduction, 80 T5 At 90 degrees of abduction, 60  Shoulder external rotation T3 At 90 degrees of abduction 75 T3, at 90 degrees of abduction 60 deg  Elbow flexion full Full  Elbow extension 0 0  Wrist flexion     Wrist extension    Wrist ulnar deviation    Wrist radial deviation    Wrist pronation    Wrist supination    (Blank rows = not tested)  UPPER EXTREMITY MMT:  MMT Right eval Left eval  Shoulder flexion 4 4-  Shoulder extension    Shoulder abduction 4+ 4-  Shoulder adduction    Shoulder internal rotation 5 5  Shoulder external rotation 5 5  Middle trapezius    Lower trapezius    Elbow flexion 5 5  Elbow extension 5 5  Wrist flexion    Wrist extension    Wrist ulnar deviation    Wrist radial deviation    Wrist pronation    Wrist supination    Grip strength (lbs)    (Blank rows = not tested)  SHOULDER SPECIAL TESTS: Rotator cuff assessment: Empty can test: positive  Biceps assessment:  Normal strength  Cervical active range of motion Flexion 58 no pain Extension 55 no pain Right sidebending 47 Left sidebending 49 Right rotation within normal limits Left rotation lacks 10 to 15% end range  Negative Spurling test bilateral Suboccipital release, manual traction felt good to patient but he did not have numbness in his right arm at that time to compare.  JOINT MOBILITY TESTING:  Posterior capsule tightness  PALPATION:  Patient sore with gross palpation to bilateral anterior shoulders Soreness noted along bilateral posterior cervical muscles and tension in upper traps   TODAY'S TREATMENT:   [facility] Adult PT Treatment:                                                DATE: 01/20/23 Therapeutic Exercise: Standing shoulder rows 40# FM 10 x 2  Standing lat pull down 40# FM  Standing Green band horiz  Standing ITY GTB x 10 each  Pec stretch in doorway  Bilat ER BTB  Supine protraction Rt 4# x 20 Supine 4# Right pullover  S/L right shoulder abduction 3# x 10, 4# x 10 S/L right shoulder ER 3# 10 x 2  Sleeper Stretch 30 sec  x 2 each  Star gazer stretch supine     [facility] Adult PT Treatment:                                                DATE: 01/13/23 Therapeutic  Exercise: Omega shoulder rows 3x10 55# Omega lat pulls 3x10 55# Omega chest press c plus one 3x10 65# Standing bilat ER BluTB 3x10 Corner Pec Minor Stretch 2x - 30 hold Doorway Pec Stretch at 90 Degrees Abduction  2x- 30 hold R upper trap stretch 2x 15" Manual Therapy: STM to the R upper trap and infraspinatus  Skilled palpation to identify TrPs and taut muscle bands Trigger Point Dry Needling Treatment: Pre-treatment instruction: Patient instructed on dry needling rationale, procedures, and possible side effects including pain during treatment (achy,cramping feeling), bruising, drop of blood, lightheadedness, nausea, sweating. Patient Consent Given: Yes Education handout provided: Yes Muscles treated: R upper trap and infraspinatus  Needle size and number: .30x75mm x 2 Electrical stimulation performed: No Parameters: N/A Treatment response/outcome: Twitch response elicited Post-treatment instructions: Patient instructed to expect possible mild to moderate muscle soreness later today and/or tomorrow. Patient instructed in methods to reduce muscle soreness and to continue prescribed HEP. If patient was dry needled over the lung field, patient was instructed on signs and symptoms of pneumothorax and, however unlikely, to see immediate medical attention should they occur. Patient was also educated on signs and symptoms of infection and to seek medical attention should they occur. Patient verbalized understanding of these instructions and education.    [facility] Adult PT Treatment:                                                DATE: 01/11/23 Therapeutic Exercise: Supine overhead flexion with dowel x 10 full ROM  Cross body reaching x 10  Goal post ER/IR supine  Sidelying Shoulder Abduction with Resistance to 60 Degrees  Supine Shoulder Horizontal Abduction with Resistance   Shoulder External Rotation and Scapular Retraction with Resistance  Doorway Pec Stretch at 90 Degrees Abduction   ER 1  plate on freemotion x 15  Row 3 plates x 15  Horizontal abd 1 plate x 10  Upper trap and levator stretch  Sleeper stretch- next visit  Manual Therapy: Posterior cervicals, light manual suboccipital release     [facility] Adult PT Treatment:                                                DATE: 01/07/23 Therapeutic Exercise: Sidelying Shoulder Abduction with Resistance to 60 Degrees 2 sets - 10 reps - 5 hold Supine Shoulder Horizontal Abduction with Resistance 2 sets - 10 reps - 5 hold Shoulder External Rotation and Scapular Retraction with Resistance 2 sets - 10 reps - 5 hold Corner Pec Minor Stretch 3 reps - 30 hold Doorway Pec Stretch at 90 Degrees Abduction 3 reps - 30 Manual Therapy: STM to the bilat upper shoulders and R infraspinatus and teres minor. TPR was provided to the infraspinatus. CFM to the R ant GH jt area Self Care: Pt Ed for pillow support  for the UEs when sleeping supine or S/L and in sitting, cross fiction massage to the ant GH jt area, and tennis ball massage for the mid back scapular areas. Pt completed tennis ball massage on wall.                                                                                                                                       DATE: 12/22/22   PATIENT EDUCATION: Education details: PT, plan of care, home exercise program Person educated: Patient Education method: Explanation, Demonstration, and Handouts Education comprehension: verbalized understanding, returned demonstration, and needs further education  HOME EXERCISE PROGRAM: Access Code: [email or web address] URL: [email or web address] Date: 01/07/2023 Prepared by: Joellyn Rued  Exercises - Sidelying Shoulder Abduction with Resistance to 60 Degrees  - 1-2 x daily - 7 x weekly - 2 sets - 10 reps - 5 hold - Supine Shoulder Horizontal Abduction with Resistance  - 1-2 x daily - 7 x weekly - 2 sets - 10 reps - 5 hold - Shoulder External Rotation and Scapular Retraction with  Resistance  - 1-2 x daily - 7 x weekly - 2 sets - 10 reps - 5 hold - Corner Pec Minor Stretch  - 1-2 x daily - 7 x weekly - 1 sets - 3 reps - 30 hold - Doorway Pec Stretch at 90 Degrees Abduction  - 1-2 x daily - 7 x weekly - 1 sets - 3 reps - 30 hold  ASSESSMENT:  CLINICAL IMPRESSION: Pt reports TPDN was okay, he did not have soreness afterward. He is trying to be more mindful of posture, needs cues in clinic. He reports right shoulder pain still bothersome along top of shoulder, left shoulder is much improved. Continued with posterior chain strengthening and GHJ and posterior capsule stretching. SLS >20 sec bilateral and 5 x STS 11 sec. He does reports RLE shorter than right so he stands with weight on LLE most of the time. Less stability in Rt SLS however can hold >20 sec.  Pt tolerated PT today without adverse effects. Pt will continue to benefit from skilled PT to address impairments for improved shulder function with minimized pain.  OBJECTIVE IMPAIRMENTS: decreased ROM, decreased strength, increased fascial restrictions, impaired sensation, impaired UE functional use, postural dysfunction, and pain.   ACTIVITY LIMITATIONS: carrying, lifting, sitting, and reach over head  PARTICIPATION LIMITATIONS: shopping, community activity, occupation, and yard work   PERSONAL FACTORS: Time since onset of injury/illness/exacerbation and 1-2 comorbidities: Right sided biceps tear, chronic  are also affecting patient's functional outcome.   REHAB POTENTIAL: Excellent  CLINICAL DECISION MAKING: Stable/uncomplicated  EVALUATION COMPLEXITY: Low   GOALS: Goals reviewed with patient? Yes  SHORT TERM GOALS: Target date: 01/19/2023    Patient will be independent with home exercise program Baseline: Goal status: met   2.  Patient will sit with corrected posture and make changes to his use of technology to reduce  tension on neck Baseline:  01/20/23: Trying to be more mindful  Goal status:  Ongoing  3.  Balance screen will be completed if needed  Baseline:  01/20/23: 20-30 sec SLS each, STS 11 sec  Goal status: INITIAL   LONG TERM GOALS: Target date: 02/16/2023    Patient will be independent with home exercise program and gym exercises upon discharge Baseline:  Goal status: INITIAL  2.  Patient will be able to demonstrate normal active range of motion in bilateral shoulders without increased pain Baseline:  Goal status: INITIAL  3.  Bilateral shoulder strength will be 5/5 in all planes in order to perform lifting and carrying activities Baseline:  Goal status: INITIAL  4.  Patient will be able to sleep on his right side without difficulty Baseline:   Goal status: INITIAL  5.  Foto score will improve to 76% Baseline: 66% Goal status: INITIAL  PLAN:  PT FREQUENCY: 2x/week  PT DURATION: 8 weeks6-8  PLANNED INTERVENTIONS: 97164- PT Re-evaluation, 97110-Therapeutic exercises, 97530- Therapeutic activity, 97112- Neuromuscular re-education, 97535- Self Care, 88416- Manual therapy, Patient/Family education, Balance training, Taping, Dry Needling, Joint mobilization, Cryotherapy, and Moist heat  PLAN FOR NEXT SESSION: ask about balance. Does he need screening? Check home exercise program, posture scapular stabilization, UBE  Jannette Spanner, PTA 01/20/23 11:46 AM Phone: [phone] Fax: [phone]

## 2023-01-22 ENCOUNTER — Other Ambulatory Visit (HOSPITAL_COMMUNITY): Payer: Self-pay

## 2023-01-22 ENCOUNTER — Ambulatory Visit: Payer: 59 | Admitting: Physical Therapy

## 2023-01-22 DIAGNOSIS — R293 Abnormal posture: Secondary | ICD-10-CM | POA: Diagnosis not present

## 2023-01-22 DIAGNOSIS — G8929 Other chronic pain: Secondary | ICD-10-CM

## 2023-01-22 DIAGNOSIS — M25511 Pain in right shoulder: Secondary | ICD-10-CM | POA: Diagnosis not present

## 2023-01-22 DIAGNOSIS — M25512 Pain in left shoulder: Secondary | ICD-10-CM | POA: Diagnosis not present

## 2023-01-22 NOTE — Therapy (Signed)
OUTPATIENT PHYSICAL THERAPY SHOULDER TREATMENT   Patient Name: Nathan [name] MRN: [id number] DOB:Aug 15, 1965, 57 y.o., male Today's Date: 01/22/2023  END OF SESSION:  PT End of Session - 01/22/23 1106     Visit Number 6    Number of Visits 16    Date for PT Re-Evaluation 02/16/23    Authorization Type [facility] [employer] blue until dec. 1    PT Start Time 1104    PT Stop Time 1145    PT Time Calculation (min) 41 min    Activity Tolerance Patient tolerated treatment well    Behavior During Therapy [facility] for tasks assessed/performed                 Past Medical History:  Diagnosis Date   Allergies    09/16/2020   BPH (benign prostatic hyperplasia)    ED (erectile dysfunction)    GERD (gastroesophageal reflux disease)    Osteoarthritis    Peripheral focal chorioretinal inflammation of both eyes    Retinal vasculitis of both eyes    Sarcoidosis    Past Surgical History:  Procedure Laterality Date   BRONCHIAL NEEDLE ASPIRATION BIOPSY  10/18/2020   Procedure: BRONCHIAL NEEDLE ASPIRATION BIOPSIES;  Surgeon: Josephine Igo, DO;  Location: [facility];  Service: Pulmonary;;   BRONCHIAL WASHINGS  10/18/2020   Procedure: BRONCHIAL WASHINGS;  Surgeon: Josephine Igo, DO;  Location: [facility];  Service: Pulmonary;;   ICD IMPLANT N/A 10/28/2020   Procedure: ICD IMPLANT;  Surgeon: Lanier Prude, MD;  Location: [facility] INVAS[facility] CV LAB;  Service: Cardiovascular;  Laterality: N/A;   RIGHT/LEFT HEART CATH AND CORONARY ANGIOGRAPHY N/A 10/16/2020   Procedure: RIGHT/LEFT HEART CATH AND CORONARY ANGIOGRAPHY;  Surgeon: Marykay Lex, MD;  Location: [facility] INVASIVE CV LAB;  Service: Cardiovascular;  Laterality: N/A;   VIDEO BRONCHOSCOPY WITH ENDOBRONCHIAL ULTRASOUND Bilateral 10/18/2020   Procedure: VIDEO BRONCHOSCOPY WITH ENDOBRONCHIAL ULTRASOUND;  Surgeon: Josephine Igo, DO;  Location: [facility];  Service: Pulmonary;  Laterality: Bilateral;   Patient Active Problem List    Diagnosis Date Noted   Biceps tendon tear 11/16/2022   Vitamin D deficiency 08/14/2022   High risk medication use 05/25/2022   Chronic systolic heart failure (HCC) 11/04/2020   ICD (implantable cardioverter-defibrillator) in place    Preoperative examination    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 Nadyne Coombes REFERRING PROVIDER: Madelyn Brunner, DO  REFERRING DIAG: [phone] (ICD-10-CM) - Chronic left shoulder pain M75.102 (ICD-10-CM) - Nontraumatic tear of supraspinatus tendon, left M25.511,G89.29 (ICD-10-CM) - Chronic right shoulder pain S46.219A (ICD-10-CM) - Biceps tendon tear  THERAPY DIAG:  Chronic pain of both shoulders  Abnormal posture  Rationale for Evaluation and Treatment: Rehabilitation  ONSET DATE: 12 to 18 months ago  SUBJECTIVE:  SUBJECTIVE STATEMENT: Pt has a little bit of pain Rt shoulder.  Joined [employer].    Pt reports his bilat shoulder pain is better. He reports starting to complete incline push ups. Pt notes he joined a gym yesterday.  EVAL: Patient reports injuries over 12 months ago at separate times.  His right arm was injured about a year ago as he lifted a heavy piece of work equipment.  He had severe pain and later that night noticed a bulging bicep on that side but the pain did eventually resolve.  Sometime before that he lost his balance while walking and fell into the wall, caught himself with his left arm. Currently he reports right-sided anterior shoulder and arm  pain which extends down the front of his right arm.  He reports the pain radiates from the side of his neck down to his right elbow.  Today that area is numb.  The left shoulder is painful in the front and top kind of generally.  He reports he will have pain and crepitus when reaching certain ways.  He used to work out and performing a lateral raise on that left side was extremely painful.  He recently received a shot in the left shoulder and that has improved it quite a bit He currently has difficulty sleeping on his right side due to the pain.  He did have x-ray and ultrasound of each arm see below for results.  He has not had physical therapy before this time.   Hand dominance: Right  PERTINENT HISTORY: Sarcoidosis, defibrillator implant PAIN:  Are you having pain? Yes: NPRS scale: 3 in the Rt /10 Pain location: Rt superior shoulder  Pain description: numb and sore  Aggravating factors: positioning  Relieving factors: cream, changing positions helps   Yes: NPRS scale: 0/10 Pain location: Lt.UE ant  Pain description: sharp pain, aching Aggravating factors: reaching out to the side  Relieving factors: injection   PRECAUTIONS: None no electric stim due to defibrillator  RED FLAGS: None   WEIGHT BEARING RESTRICTIONS: No  FALLS:  Has patient fallen in last 6 months? No he does feel off balance sometimes.    LIVING ENVIRONMENT: Lives with: lives with their family mom with health issues .   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   PLOF: Independent, Vocation/Vocational requirements: plays every week, setting up and load/unload equipment, and Leisure: golf  He does all the cooking in the house work for his mother who is independent  with mobility  PATIENT GOALS:I want to be able to get stronger, lift weights and prevent surgery if possible.   NEXT MD VISIT: 6  weeks  OBJECTIVE:  Note: Objective measures were completed at Evaluation unless otherwise noted.  DIAGNOSTIC FINDINGS:  Partial tearing of the proximal supraspinatus tendon without full-thickness or retraction.    Biceps tendon shows no signs of hypoechoic changes and tendon appears to be intact without any concern. AC joint does show some narrowing as well as some spurring coming off the distal clavicle, no significant effusion noted. The supraspinatus has some minor hypoechoic changes consistent with some small muscle tears in the proximal body of the muscle however the tendon insertion appears appropriate without any signs of fluid accumulation or tears. There is some mild swelling of the supraspinatus bursa. The infraspinatus has no signs of any hypoechoic changes, no tears noted of the tendon or the  muscle belly. Insertion of the tendon is appropriate on its footplate.  3 view x-ray left shoulder AP, axial and Grashey view.  X-ray shows appropriate joint space between the humeral head and the glenoid.  There is some narrowing of the AC joint, indicative of OA.  There is no cortical irregularities noted along the neck of the humerus.  There is some sclerotic changes of the humeral head on the lateral aspect of the greater tuberosity. No acute fracture.    PATIENT SURVEYS:  FOTO 66%  COGNITION: Overall cognitive status: Within functional limits for tasks assessed     SENSATION: Right upper extremity feels numb with certain positions and sitting  POSTURE: Forward head posture glenohumeral internal rotation humeral head sit anteriorly scapula a AB ducted from midline Right Popeye sign UPPER EXTREMITY ROM:   Active ROM Right eval Left eval  Shoulder flexion 155 150  Shoulder extension    Shoulder abduction 140 140  Shoulder adduction    Shoulder internal rotation Mid T  At 90 degrees of abduction, 80 T5 At 90 degrees of abduction, 60  Shoulder external rotation T3 At 90 degrees  of abduction 75 T3, at 90 degrees of abduction 60 deg  Elbow flexion full Full  Elbow extension 0 0  Wrist flexion    Wrist extension    Wrist ulnar deviation    Wrist radial deviation    Wrist pronation    Wrist supination    (Blank rows = not tested)  UPPER EXTREMITY MMT:  MMT Right eval Left eval  Shoulder flexion 4 4-  Shoulder extension    Shoulder abduction 4+ 4-  Shoulder adduction    Shoulder internal rotation 5 5  Shoulder external rotation 5 5  Middle trapezius    Lower trapezius    Elbow flexion 5 5  Elbow extension 5 5  Wrist flexion    Wrist extension    Wrist ulnar deviation    Wrist radial deviation    Wrist pronation    Wrist supination    Grip strength (lbs)    (Blank rows = not tested)  SHOULDER SPECIAL TESTS: Rotator cuff assessment: Empty can test: positive  Biceps assessment:  Normal strength  Cervical active range of motion Flexion 58 no pain Extension 55 no pain Right sidebending 47 Left sidebending 49 Right rotation within normal limits Left rotation lacks 10 to 15% end range  Negative Spurling test bilateral Suboccipital release, manual traction felt good to patient but he did not have numbness in his right arm at that time to compare.  JOINT MOBILITY TESTING:  Posterior capsule tightness  PALPATION:  Patient sore with gross palpation to bilateral anterior shoulders Soreness noted along bilateral posterior cervical muscles and tension in upper traps   TODAY'S TREATMENT:    [facility] Adult PT Treatment:                                                DATE: 01/22/23 Therapeutic Exercise: UBE reverse 5 min L1 Wall for upper back/posture: blue band pulls (horizontal )  Lat pull down blue band at wall  Bilat ER BTB x 15  Bent over row 15 lbs kneel on mat  x 15 Diagonal pull blue band x 10 each side Narrow looped band overhead lift and chest press x 10 each  Plank on high mat  x 30  sec Forearm plank kneeling x 30 sec + Self  Care: Plan of care, gym exercises    [facility] Adult PT Treatment:                                                DATE: 01/20/23 Therapeutic Exercise: Standing shoulder rows 40# FM 10 x 2  Standing lat pull down 40# FM  Standing Green band horiz  Standing ITY GTB x 10 each  Pec stretch in doorway  Bilat ER BTB  Supine protraction Rt 4# x 20 Supine 4# Right pullover  S/L right shoulder abduction 3# x 10, 4# x 10 S/L right shoulder ER 3# 10 x 2  Sleeper Stretch 30 sec x 2 each  Star gazer stretch supine     [facility] Adult PT Treatment:                                                DATE: 01/13/23 Therapeutic Exercise: Omega shoulder rows 3x10 55# Omega lat pulls 3x10 55# Omega chest press c plus one 3x10 65# Standing bilat ER BluTB 3x10 Corner Pec Minor Stretch 2x - 30 hold Doorway Pec Stretch at 90 Degrees Abduction  2x- 30 hold R upper trap stretch 2x 15" Manual Therapy: STM to the R upper trap and infraspinatus  Skilled palpation to identify TrPs and taut muscle bands Trigger Point Dry Needling Treatment: Pre-treatment instruction: Patient instructed on dry needling rationale, procedures, and possible side effects including pain during treatment (achy,cramping feeling), bruising, drop of blood, lightheadedness, nausea, sweating. Patient Consent Given: Yes Education handout provided: Yes Muscles treated: R upper trap and infraspinatus  Needle size and number: .30x50mm x 2 Electrical stimulation performed: No Parameters: N/A Treatment response/outcome: Twitch response elicited Post-treatment instructions: Patient instructed to expect possible mild to moderate muscle soreness later today and/or tomorrow. Patient instructed in methods to reduce muscle soreness and to continue prescribed HEP. If patient was dry needled over the lung field, patient was instructed on signs and symptoms of pneumothorax and, however unlikely, to see immediate medical attention should they occur. Patient was  also educated on signs and symptoms of infection and to seek medical attention should they occur. Patient verbalized understanding of these instructions and education.    [facility] Adult PT Treatment:                                                DATE: 01/11/23 Therapeutic Exercise: Supine overhead flexion with dowel x 10 full ROM  Cross body reaching x 10  Goal post ER/IR supine  Sidelying Shoulder Abduction with Resistance to 60 Degrees  Supine Shoulder Horizontal Abduction with Resistance   Shoulder External Rotation and Scapular Retraction with Resistance  Doorway Pec Stretch at 90 Degrees Abduction   ER 1 plate on freemotion x 15  Row 3 plates x 15  Horizontal abd 1 plate x 10  Upper trap and levator stretch  Sleeper stretch- next visit  Manual Therapy: Posterior cervicals, light manual suboccipital release    DATE: 12/22/22   PATIENT EDUCATION: Education details: PT, plan of care, home  exercise program Person educated: Patient Education method: Explanation, Demonstration, and Handouts Education comprehension: verbalized understanding, returned demonstration, and needs further education  HOME EXERCISE PROGRAM:  Access Code: [email or web address] URL: [email or web address] Date: 01/22/2023 Prepared by: Karie Mainland  Exercises - Sidelying Shoulder Abduction with Resistance to 60 Degrees  - 1-2 x daily - 7 x weekly - 2 sets - 10 reps - 5 hold - Supine Shoulder Horizontal Abduction with Resistance  - 1-2 x daily - 7 x weekly - 2 sets - 10 reps - 5 hold - Shoulder External Rotation and Scapular Retraction with Resistance  - 1-2 x daily - 7 x weekly - 2 sets - 10 reps - 5 hold - Corner Pec Minor Stretch  - 1-2 x daily - 7 x weekly - 1 sets - 3 reps - 30 hold - Doorway Pec Stretch at 90 Degrees Abduction  - 1-2 x daily - 7 x weekly - 1 sets - 3 reps - 30 hold - Shoulder Lat Pull Down with Resistance  - 1 x daily - 7 x weekly - 2 sets - 10 reps - 5 hold - Kneeling Pushup Plus on  Elbows  - 1 x daily - 7 x weekly - 1 sets - 3-5 reps - 30 hold  ASSESSMENT:  CLINICAL IMPRESSION: Patient with overall minimal pain in the right shoulder.  Left shoulder is more restricted with strengthening and range of motion.  Discussed the need to continue to attend physical therapy despite going to the gym.  He was in agreement that continued PT is a good idea.  His goal is to avoid surgery and maximize his shoulder function.  Eventually he may go down to 1 time per week.  He had min increase in pain in the left shoulder with diagonal pull but otherwise felt good after the session. Print new HEP.    OBJECTIVE IMPAIRMENTS: decreased ROM, decreased strength, increased fascial restrictions, impaired sensation, impaired UE functional use, postural dysfunction, and pain.   ACTIVITY LIMITATIONS: carrying, lifting, sitting, and reach over head  PARTICIPATION LIMITATIONS: shopping, community activity, occupation, and yard work   PERSONAL FACTORS: Time since onset of injury/illness/exacerbation and 1-2 comorbidities: Right sided biceps tear, chronic  are also affecting patient's functional outcome.   REHAB POTENTIAL: Excellent  CLINICAL DECISION MAKING: Stable/uncomplicated  EVALUATION COMPLEXITY: Low   GOALS: Goals reviewed with patient? Yes  SHORT TERM GOALS: Target date: 01/19/2023    Patient will be independent with home exercise program Baseline: Goal status: met   2.  Patient will sit with corrected posture and make changes to his use of technology to reduce tension on neck Baseline:  01/20/23: Trying to be more mindful  Goal status: Ongoing  3.  Balance screen will be completed if needed  Baseline:  01/20/23: 20-30 sec SLS each, STS 11 sec  Goal status: INITIAL   LONG TERM GOALS: Target date: 02/16/2023    Patient will be independent with home exercise program and gym exercises upon discharge Baseline:  Goal status: INITIAL  2.  Patient will be able to demonstrate  normal active range of motion in bilateral shoulders without increased pain Baseline:  Goal status: INITIAL  3.  Bilateral shoulder strength will be 5/5 in all planes in order to perform lifting and carrying activities Baseline:  Goal status: INITIAL  4.  Patient will be able to sleep on his right side without difficulty Baseline:   Goal status: INITIAL  5.  Foto score will improve to 76% Baseline: 66%  Goal status: INITIAL  PLAN:  PT FREQUENCY: 2x/week  PT DURATION: 8 weeks6-8  PLANNED INTERVENTIONS: 97164- PT Re-evaluation, 97110-Therapeutic exercises, 97530- Therapeutic activity, 97112- Neuromuscular re-education, 97535- Self Care, [id number]- Manual therapy, Patient/Family education, Balance training, Taping, Dry Needling, Joint mobilization, Cryotherapy, and Moist heat  PLAN FOR NEXT SESSION:  posture scapular stabilization, UBE Karie Mainland, PT 01/22/23 1:03 PM Phone: [phone] Fax: [phone]

## 2023-01-25 ENCOUNTER — Ambulatory Visit: Payer: 59 | Admitting: Physical Therapy

## 2023-01-25 ENCOUNTER — Encounter: Payer: Self-pay | Admitting: Physical Therapy

## 2023-01-25 DIAGNOSIS — R293 Abnormal posture: Secondary | ICD-10-CM

## 2023-01-25 DIAGNOSIS — G8929 Other chronic pain: Secondary | ICD-10-CM

## 2023-01-25 DIAGNOSIS — H2013 Chronic iridocyclitis, bilateral: Secondary | ICD-10-CM | POA: Diagnosis not present

## 2023-01-25 DIAGNOSIS — M25512 Pain in left shoulder: Secondary | ICD-10-CM | POA: Diagnosis not present

## 2023-01-25 DIAGNOSIS — N401 Enlarged prostate with lower urinary tract symptoms: Secondary | ICD-10-CM | POA: Diagnosis not present

## 2023-01-25 DIAGNOSIS — M25511 Pain in right shoulder: Secondary | ICD-10-CM | POA: Diagnosis not present

## 2023-01-25 DIAGNOSIS — F5221 Male erectile disorder: Secondary | ICD-10-CM | POA: Diagnosis not present

## 2023-01-25 DIAGNOSIS — D869 Sarcoidosis, unspecified: Secondary | ICD-10-CM | POA: Diagnosis not present

## 2023-01-25 NOTE — Therapy (Signed)
OUTPATIENT PHYSICAL THERAPY SHOULDER TREATMENT   Patient Name: Nathan [name] MRN: [id number] DOB:Jul 29, 1965, 57 y.o., male Today's Date: 01/25/2023  END OF SESSION:  PT End of Session - 01/25/23 1102     Visit Number 7    Number of Visits 16    Date for PT Re-Evaluation 02/16/23    Authorization Type [facility] [employer] blue until dec. 1    PT Start Time 1102    PT Stop Time 1145    PT Time Calculation (min) 43 min                 Past Medical History:  Diagnosis Date   Allergies    09/16/2020   BPH (benign prostatic hyperplasia)    ED (erectile dysfunction)    GERD (gastroesophageal reflux disease)    Osteoarthritis    Peripheral focal chorioretinal inflammation of both eyes    Retinal vasculitis of both eyes    Sarcoidosis    Past Surgical History:  Procedure Laterality Date   BRONCHIAL NEEDLE ASPIRATION BIOPSY  10/18/2020   Procedure: BRONCHIAL NEEDLE ASPIRATION BIOPSIES;  Surgeon: Josephine Igo, DO;  Location: [facility];  Service: Pulmonary;;   BRONCHIAL WASHINGS  10/18/2020   Procedure: BRONCHIAL WASHINGS;  Surgeon: Josephine Igo, DO;  Location: [facility];  Service: Pulmonary;;   ICD IMPLANT N/A 10/28/2020   Procedure: ICD IMPLANT;  Surgeon: Lanier Prude, MD;  Location: [facility];  Service: Cardiovascular;  Laterality: N/A;   RIGHT/LEFT HEART CATH AND CORONARY ANGIOGRAPHY N/A 10/16/2020   Procedure: RIGHT/LEFT HEART CATH AND CORONARY ANGIOGRAPHY;  Surgeon: Marykay Lex, MD;  Location: [facility] INVASIVE CV LAB;  Service: Cardiovascular;  Laterality: N/A;   VIDEO BRONCHOSCOPY WITH ENDOBRONCHIAL ULTRASOUND Bilateral 10/18/2020   Procedure: VIDEO BRONCHOSCOPY WITH ENDOBRONCHIAL ULTRASOUND;  Surgeon: Josephine Igo, DO;  Location: [facility];  Service: Pulmonary;  Laterality: Bilateral;   Patient Active Problem List   Diagnosis Date Noted   Biceps tendon tear 11/16/2022   Vitamin D deficiency 08/14/2022   High risk medication use  05/25/2022   Chronic systolic heart failure (HCC) 11/04/2020   ICD (implantable cardioverter-defibrillator) in place    Preoperative examination    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 Nadyne Coombes REFERRING PROVIDER: Madelyn Brunner, DO  REFERRING DIAG: [phone] (ICD-10-CM) - Chronic left shoulder pain M75.102 (ICD-10-CM) - Nontraumatic tear of supraspinatus tendon, left M25.511,G89.29 (ICD-10-CM) - Chronic right shoulder pain S46.219A (ICD-10-CM) - Biceps tendon tear  THERAPY DIAG:  Chronic pain of both shoulders  Abnormal posture  Rationale for Evaluation and Treatment: Rehabilitation  ONSET DATE: 12 to 18 months ago  SUBJECTIVE:  SUBJECTIVE STATEMENT: The right shoulder is hurting today and having a little numbness into bicep.     EVAL: Patient reports injuries over 12 months ago at separate times.  His right arm was injured about a year ago as he lifted a heavy piece of work equipment.  He had severe pain and later that night noticed a bulging bicep on that side but the pain did eventually resolve.  Sometime before that he lost his balance while walking and fell into the wall, caught himself with his left arm. Currently he reports right-sided anterior shoulder and arm pain which extends down the front of his right arm.  He reports the pain radiates from the side of his neck down to his right elbow.  Today that area is numb.  The left shoulder is painful in the front and top kind of generally.  He  reports he will have pain and crepitus when reaching certain ways.  He used to work out and performing a lateral raise on that left side was extremely painful.  He recently received a shot in the left shoulder and that has improved it quite a bit He currently has difficulty sleeping on his right side due to the pain.  He did have x-ray and ultrasound of each arm see below for results.  He has not had physical therapy before this time.   Hand dominance: Right  PERTINENT HISTORY: Sarcoidosis, defibrillator implant PAIN:  Are you having pain? Yes: NPRS scale: 4 in the Rt /10 Pain location: Rt superior shoulder  Pain description: numb and sore  Aggravating factors: positioning  Relieving factors: cream, changing positions helps   Yes: NPRS scale: 0/10 Pain location: Lt.UE ant  Pain description: sharp pain, aching Aggravating factors: reaching out to the side  Relieving factors: injection   PRECAUTIONS: None no electric stim due to defibrillator  RED FLAGS: None   WEIGHT BEARING RESTRICTIONS: No  FALLS:  Has patient fallen in last 6 months? No he does feel off balance sometimes.    LIVING ENVIRONMENT: Lives with: lives with their family mom with health issues .   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   PLOF: Independent, Vocation/Vocational requirements: plays every week, setting up and load/unload equipment, and Leisure: golf  He does all the cooking in the house work for his mother who is independent  with mobility  PATIENT GOALS: I want to be able to get stronger, lift weights and prevent surgery if possible.   NEXT MD VISIT: 6 weeks  OBJECTIVE:  Note: Objective measures were completed at Evaluation unless otherwise noted.  DIAGNOSTIC FINDINGS:  Partial tearing of the proximal supraspinatus tendon without full-thickness or retraction.    Biceps tendon  shows no signs of hypoechoic changes and tendon appears to be intact without any concern. AC joint does show some narrowing as well as some spurring coming off the distal clavicle, no significant effusion noted. The supraspinatus has some minor hypoechoic changes consistent with some small muscle tears in the proximal body of the muscle however the tendon insertion appears appropriate without any signs of fluid accumulation or tears. There is some mild swelling of the supraspinatus bursa. The infraspinatus has no signs of any hypoechoic changes, no tears noted of the tendon or the muscle belly. Insertion of the tendon is appropriate on its footplate.  3 view x-ray left shoulder AP, axial and Grashey view.  X-ray shows appropriate joint space between the humeral head and the glenoid.  There is some narrowing of the AC joint, indicative of OA.  There is no cortical irregularities noted along the neck of the humerus.  There is some sclerotic changes of the humeral head on the lateral aspect of the greater tuberosity. No acute fracture.    PATIENT SURVEYS:  FOTO 66% 01/25/23: 66%   COGNITION: Overall cognitive status: Within functional limits for tasks assessed     SENSATION: Right upper extremity feels numb with certain positions and sitting  POSTURE: Forward head posture glenohumeral internal rotation humeral head sit anteriorly scapula a AB ducted from midline Right Popeye sign  UPPER EXTREMITY ROM:   Active ROM Right eval Left eval  Shoulder flexion 155 150  Shoulder extension    Shoulder abduction 140 140  Shoulder adduction    Shoulder internal rotation Mid T  At 90 degrees of abduction, 80 T5 At 90 degrees of abduction, 60  Shoulder external rotation T3 At 90 degrees of abduction 75 T3, at 90 degrees of abduction 60 deg  Elbow flexion full Full  Elbow extension 0 0  Wrist flexion    Wrist extension    Wrist ulnar deviation    Wrist radial deviation    Wrist pronation     Wrist supination    (Blank rows = not tested)  UPPER EXTREMITY MMT:  MMT Right eval Left eval  Shoulder flexion 4 4-  Shoulder extension    Shoulder abduction 4+ 4-  Shoulder adduction    Shoulder internal rotation 5 5  Shoulder external rotation 5 5  Middle trapezius    Lower trapezius    Elbow flexion 5 5  Elbow extension 5 5  Wrist flexion    Wrist extension    Wrist ulnar deviation    Wrist radial deviation    Wrist pronation    Wrist supination    Grip strength (lbs)    (Blank rows = not tested)  SHOULDER SPECIAL TESTS: Rotator cuff assessment: Empty can test: positive  Biceps assessment:  Normal strength  Cervical active range of motion Flexion 58 no pain Extension 55 no pain Right sidebending 47 Left sidebending 49 Right rotation within normal limits Left rotation lacks 10 to 15% end range  Negative Spurling test bilateral Suboccipital release, manual traction felt good to patient but he did not have numbness in his right arm at that time to compare.  JOINT MOBILITY TESTING:  Posterior capsule tightness  PALPATION:  Patient sore with gross palpation to bilateral anterior shoulders Soreness noted along bilateral posterior cervical muscles and tension in upper traps   TODAY'S TREATMENT:   [facility] Adult PT Treatment:                                                DATE: 01/25/23 Therapeutic Exercise: UBE level 3 2 min each way  Horiz abdct blue 10 x 2  Diagonals 10 x 2 each  Lat pull 10 x 2  ER bilat x 10 blue x 10 ER left only red x 10  Doorway Pec Stretch at 90 Degrees Abduction  Red band ER with wall slides   Protract/retract forearms on wall  Sleeper stretch on wall , left  Free motion 23# row x 10 20# pull down x 10  Plank on forearms  Qped alt  UE raise Supine serratus punch  Star gazer stretch  Supine rhythmic stabilization x 30 sec each   Therapeutic Activity: FOTO 66%    [facility] Adult PT Treatment:                                                 DATE: 01/22/23 Therapeutic Exercise: UBE reverse 5 min L1 Wall for upper back/posture: blue band pulls (horizontal )  Lat pull down blue band at wall  Bilat ER BTB x 15  Bent over row 15 lbs kneel on mat  x 15 Diagonal pull blue band x 10 each side Narrow looped band overhead lift and chest press x 10 each  Plank on high mat  x 30 sec Forearm plank kneeling x 30 sec + Self Care: Plan of care, gym exercises    [facility] Adult PT Treatment:                                                DATE: 01/20/23 Therapeutic Exercise: Standing shoulder rows 40# FM 10 x 2  Standing lat pull down 40# FM  Standing Green band horiz  Standing ITY GTB x 10 each  Pec stretch in doorway  Bilat ER BTB  Supine protraction Rt 4# x 20 Supine 4# Right pullover  S/L right shoulder abduction 3# x 10, 4# x 10 S/L right shoulder ER 3# 10 x 2  Sleeper Stretch 30 sec x 2 each  Star gazer stretch supine     [facility] Adult PT Treatment:                                                DATE: 01/13/23 Therapeutic Exercise: Omega shoulder rows 3x10 55# Omega lat pulls 3x10 55# Omega chest press c plus one 3x10 65# Standing bilat ER BluTB 3x10 Corner Pec Minor Stretch 2x - 30 hold Doorway Pec Stretch at 90 Degrees Abduction  2x- 30 hold R upper trap stretch 2x 15" Manual Therapy: STM to the R upper trap and infraspinatus  Skilled palpation to identify TrPs and taut muscle bands Trigger Point Dry Needling Treatment: Pre-treatment instruction: Patient instructed on dry needling rationale, procedures, and possible side effects including pain during treatment (achy,cramping feeling), bruising, drop of blood, lightheadedness, nausea, sweating. Patient Consent Given: Yes Education handout provided: Yes Muscles treated: R upper trap and infraspinatus  Needle size and number: .30x60mm x 2 Electrical stimulation performed: No Parameters: N/A Treatment response/outcome: Twitch response elicited Post-treatment  instructions: Patient instructed to expect possible mild to moderate muscle soreness later today and/or tomorrow. Patient instructed in methods to reduce muscle soreness and to continue prescribed HEP. If patient was dry needled over the lung field, patient was instructed on signs and symptoms of pneumothorax and, however unlikely, to see immediate medical attention should they occur. Patient was also educated on signs and symptoms of infection and to seek medical attention should they occur. Patient verbalized understanding of these instructions and education.    [facility] Adult PT Treatment:  DATE: 01/11/23 Therapeutic Exercise: Supine overhead flexion with dowel x 10 full ROM  Cross body reaching x 10  Goal post ER/IR supine  Sidelying Shoulder Abduction with Resistance to 60 Degrees  Supine Shoulder Horizontal Abduction with Resistance   Shoulder External Rotation and Scapular Retraction with Resistance  Doorway Pec Stretch at 90 Degrees Abduction   ER 1 plate on freemotion x 15  Row 3 plates x 15  Horizontal abd 1 plate x 10  Upper trap and levator stretch  Sleeper stretch- next visit  Manual Therapy: Posterior cervicals, light manual suboccipital release    DATE: 12/22/22   PATIENT EDUCATION: Education details: PT, plan of care, home exercise program Person educated: Patient Education method: Explanation, Demonstration, and Handouts Education comprehension: verbalized understanding, returned demonstration, and needs further education  HOME EXERCISE PROGRAM:  Access Code: [email or web address] URL: [email or web address] Date: 01/22/2023 Prepared by: Karie Mainland  Exercises - Sidelying Shoulder Abduction with Resistance to 60 Degrees  - 1-2 x daily - 7 x weekly - 2 sets - 10 reps - 5 hold - Supine Shoulder Horizontal Abduction with Resistance  - 1-2 x daily - 7 x weekly - 2 sets - 10 reps - 5 hold - Shoulder External Rotation and  Scapular Retraction with Resistance  - 1-2 x daily - 7 x weekly - 2 sets - 10 reps - 5 hold - Corner Pec Minor Stretch  - 1-2 x daily - 7 x weekly - 1 sets - 3 reps - 30 hold - Doorway Pec Stretch at 90 Degrees Abduction  - 1-2 x daily - 7 x weekly - 1 sets - 3 reps - 30 hold - Shoulder Lat Pull Down with Resistance  - 1 x daily - 7 x weekly - 2 sets - 10 reps - 5 hold - Kneeling Pushup Plus on Elbows  - 1 x daily - 7 x weekly - 1 sets - 3-5 reps - 30 hold  ASSESSMENT:  CLINICAL IMPRESSION: Pt reports 4/10 right shoulder pain and numbness into bicep today which started this morning. Symptoms were exacerbated in closed chain UE positions.  No pain in left shoulder. Continued with scapular stabilization. FOTO score unchanged. Continue POC.    Patient with overall minimal pain in the right shoulder.  Left shoulder is more restricted with strengthening and range of motion.  Discussed the need to continue to attend physical therapy despite going to the gym.  He was in agreement that continued PT is a good idea.  His goal is to avoid surgery and maximize his shoulder function.  Eventually he may go down to 1 time per week.  He had min increase in pain in the left shoulder with diagonal pull but otherwise felt good after the session. Print new HEP.    OBJECTIVE IMPAIRMENTS: decreased ROM, decreased strength, increased fascial restrictions, impaired sensation, impaired UE functional use, postural dysfunction, and pain.   ACTIVITY LIMITATIONS: carrying, lifting, sitting, and reach over head  PARTICIPATION LIMITATIONS: shopping, community activity, occupation, and yard work   PERSONAL FACTORS: Time since onset of injury/illness/exacerbation and 1-2 comorbidities: Right sided biceps tear, chronic  are also affecting patient's functional outcome.   REHAB POTENTIAL: Excellent  CLINICAL DECISION MAKING: Stable/uncomplicated  EVALUATION COMPLEXITY: Low   GOALS: Goals reviewed with patient?  Yes  SHORT TERM GOALS: Target date: 01/19/2023    Patient will be independent with home exercise program Baseline: Goal status: met   2.  Patient will sit with corrected posture and make changes to  his use of technology to reduce tension on neck Baseline:  01/20/23: Trying to be more mindful  Goal status: Ongoing  3.  Balance screen will be completed if needed  Baseline:  01/20/23: 20-30 sec SLS each, STS 11 sec  Goal status: INITIAL   LONG TERM GOALS: Target date: 02/16/2023    Patient will be independent with home exercise program and gym exercises upon discharge Baseline:  Goal status: INITIAL  2.  Patient will be able to demonstrate normal active range of motion in bilateral shoulders without increased pain Baseline:  Goal status: INITIAL  3.  Bilateral shoulder strength will be 5/5 in all planes in order to perform lifting and carrying activities Baseline:  Goal status: INITIAL  4.  Patient will be able to sleep on his right side without difficulty Baseline:   Goal status: INITIAL  5.  Foto score will improve to 76% Baseline: 66% 01/25/23: 66% Goal status: ONGOING  PLAN:  PT FREQUENCY: 2x/week  PT DURATION: 8 weeks6-8  PLANNED INTERVENTIONS: 97164- PT Re-evaluation, 97110-Therapeutic exercises, 97530- Therapeutic activity, 97112- Neuromuscular re-education, 97535- Self Care, 16109- Manual therapy, Patient/Family education, Balance training, Taping, Dry Needling, Joint mobilization, Cryotherapy, and Moist heat  PLAN FOR NEXT SESSION:  posture scapular stabilization, UBE  Jannette Spanner, PTA 01/25/23 1:01 PM Phone: [phone] Fax: [phone]

## 2023-01-26 ENCOUNTER — Ambulatory Visit (INDEPENDENT_AMBULATORY_CARE_PROVIDER_SITE_OTHER): Payer: Medicaid Other

## 2023-01-26 DIAGNOSIS — I5022 Chronic systolic (congestive) heart failure: Secondary | ICD-10-CM

## 2023-01-28 ENCOUNTER — Encounter: Payer: Self-pay | Admitting: Physical Therapy

## 2023-01-28 ENCOUNTER — Ambulatory Visit: Payer: 59 | Admitting: Physical Therapy

## 2023-01-28 DIAGNOSIS — M25512 Pain in left shoulder: Secondary | ICD-10-CM | POA: Diagnosis not present

## 2023-01-28 DIAGNOSIS — R293 Abnormal posture: Secondary | ICD-10-CM | POA: Diagnosis not present

## 2023-01-28 DIAGNOSIS — G8929 Other chronic pain: Secondary | ICD-10-CM

## 2023-01-28 DIAGNOSIS — M25511 Pain in right shoulder: Secondary | ICD-10-CM | POA: Diagnosis not present

## 2023-01-28 LAB — CUP PACEART REMOTE DEVICE CHECK
Battery Voltage: 3.11 V
Brady Statistic RV Percent Paced: 0 %
Date Time Interrogation Session: 20241224071420
HighPow Impedance: 62 Ohm
Implantable Lead Connection Status: 753985
Implantable Lead Implant Date: 20220926
Implantable Lead Location: 753860
Implantable Lead Model: 436910
Implantable Lead Serial Number: 81470915
Implantable Pulse Generator Implant Date: 20220926
Lead Channel Impedance Value: 423 Ohm
Lead Channel Pacing Threshold Amplitude: 0.7 V
Lead Channel Pacing Threshold Pulse Width: 0.4 ms
Lead Channel Sensing Intrinsic Amplitude: 8 mV
Lead Channel Sensing Intrinsic Amplitude: 8 mV
Lead Channel Setting Pacing Amplitude: 3 V
Lead Channel Setting Pacing Pulse Width: 0.4 ms
Lead Channel Setting Sensing Sensitivity: 0.8 mV
Pulse Gen Model: 429525
Pulse Gen Serial Number: 84847461

## 2023-01-28 NOTE — Therapy (Signed)
OUTPATIENT PHYSICAL THERAPY SHOULDER TREATMENT   Patient Name: Nathan [name] MRN: [id number] DOB:1965-12-03, 57 y.o., male Today's Date: 01/28/2023  END OF SESSION:  PT End of Session - 01/28/23 1058     Visit Number 8    Number of Visits 16    Date for PT Re-Evaluation 02/16/23    Authorization Type [facility] [employer] until dec. 1    PT Start Time 1057    PT Stop Time 1142    PT Time Calculation (min) 45 min    Activity Tolerance Patient tolerated treatment well    Behavior During Therapy WFL for tasks assessed/performed                  Past Medical History:  Diagnosis Date   Allergies    09/16/2020   BPH (benign prostatic hyperplasia)    ED (erectile dysfunction)    GERD (gastroesophageal reflux disease)    Osteoarthritis    Peripheral focal chorioretinal inflammation of both eyes    Retinal vasculitis of both eyes    Sarcoidosis    Past Surgical History:  Procedure Laterality Date   BRONCHIAL NEEDLE ASPIRATION BIOPSY  10/18/2020   Procedure: BRONCHIAL NEEDLE ASPIRATION BIOPSIES;  Surgeon: Josephine Igo, DO;  Location: [facility];  Service: Pulmonary;;   BRONCHIAL WASHINGS  10/18/2020   Procedure: BRONCHIAL WASHINGS;  Surgeon: Josephine Igo, DO;  Location: [facility];  Service: Pulmonary;;   ICD IMPLANT N/A 10/28/2020   Procedure: ICD IMPLANT;  Surgeon: Lanier Prude, MD;  Location: [facility];  Service: Cardiovascular;  Laterality: N/A;   RIGHT/LEFT HEART CATH AND CORONARY ANGIOGRAPHY N/A 10/16/2020   Procedure: RIGHT/LEFT HEART CATH AND CORONARY ANGIOGRAPHY;  Surgeon: Marykay Lex, MD;  Location: [facility] INVASIVE CV [facility];  Service: Cardiovascular;  Laterality: N/A;   VIDEO BRONCHOSCOPY WITH ENDOBRONCHIAL ULTRASOUND Bilateral 10/18/2020   Procedure: VIDEO BRONCHOSCOPY WITH ENDOBRONCHIAL ULTRASOUND;  Surgeon: Josephine Igo, DO;  Location: [facility];  Service: Pulmonary;  Laterality: Bilateral;   Patient Active Problem List    Diagnosis Date Noted   Biceps tendon tear 11/16/2022   Vitamin D deficiency 08/14/2022   High risk medication use 05/25/2022   Chronic systolic heart failure (HCC) 11/04/2020   ICD (implantable cardioverter-defibrillator) in place    Preoperative examination    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 Nadyne Coombes REFERRING PROVIDER: Madelyn Brunner, DO  REFERRING DIAG: [phone] (ICD-10-CM) - Chronic left shoulder pain M75.102 (ICD-10-CM) - Nontraumatic tear of supraspinatus tendon, left M25.511,G89.29 (ICD-10-CM) - Chronic right shoulder pain S46.219A (ICD-10-CM) - Biceps tendon tear  THERAPY DIAG:  Chronic pain of both shoulders  Abnormal posture  Rationale for Evaluation and Treatment: Rehabilitation  ONSET DATE: 12 to 18 months ago  SUBJECTIVE:  SUBJECTIVE STATEMENT: Pt without complaints today, very min pain in shoulders.     EVAL: Patient reports injuries over 12 months ago at separate times.  His right arm was injured about a year ago as he lifted a heavy piece of work equipment.  He had severe pain and later that night noticed a bulging bicep on that side but the pain did eventually resolve.  Sometime before that he lost his balance while walking and fell into the wall, caught himself with his left arm. Currently he reports right-sided anterior shoulder and arm pain which extends down the front of his right arm.  He reports the pain radiates from the side of his neck down to his right elbow.  Today  that area is numb.  The left shoulder is painful in the front and top kind of generally.  He reports he will have pain and crepitus when reaching certain ways.  He used to work out and performing a lateral raise on that left side was extremely painful.  He recently received a shot in the left shoulder and that has improved it quite a bit He currently has difficulty sleeping on his right side due to the pain.  He did have x-ray and ultrasound of each arm see below for results.  He has not had physical therapy before this time.   Hand dominance: Right  PERTINENT HISTORY: Sarcoidosis, defibrillator implant PAIN:  Are you having pain? Yes: NPRS scale: 4 in the Rt /10 Pain location: Rt superior shoulder  Pain description: numb and sore  Aggravating factors: positioning  Relieving factors: cream, changing positions helps   Yes: NPRS scale: 0/10 Pain location: Lt.UE ant  Pain description: sharp pain, aching Aggravating factors: reaching out to the side  Relieving factors: injection   PRECAUTIONS: None no electric stim due to defibrillator  RED FLAGS: None   WEIGHT BEARING RESTRICTIONS: No  FALLS:  Has patient fallen in last 6 months? No he does feel off balance sometimes.    LIVING ENVIRONMENT: Lives with: lives with their family mom with health issues .   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   PLOF: Independent, Vocation/Vocational requirements: plays every week, setting up and load/unload equipment, and Leisure: golf  He does all the cooking in the house work for his mother who is independent  with mobility  PATIENT GOALS: I want to be able to get stronger, lift weights and prevent surgery if possible.   NEXT MD VISIT: 6 weeks  OBJECTIVE:  Note: Objective measures were completed at Evaluation unless otherwise noted.  DIAGNOSTIC FINDINGS:  Partial tearing of  the proximal supraspinatus tendon without full-thickness or retraction.    Biceps tendon shows no signs of hypoechoic changes and tendon appears to be intact without any concern. AC joint does show some narrowing as well as some spurring coming off the distal clavicle, no significant effusion noted. The supraspinatus has some minor hypoechoic changes consistent with some small muscle tears in the proximal body of the muscle however the tendon insertion appears appropriate without any signs of fluid accumulation or tears. There is some mild swelling of the supraspinatus bursa. The infraspinatus has no signs of any hypoechoic changes, no tears noted of the tendon or the muscle belly. Insertion of the tendon is appropriate on its footplate.  3 view x-ray left shoulder AP, axial and Grashey view.  X-ray shows appropriate  joint space between the humeral head and the glenoid.  There is some narrowing of the AC joint, indicative of OA.  There is no cortical irregularities noted along the neck of the humerus.  There is some sclerotic changes of the humeral head on the lateral aspect of the greater tuberosity. No acute fracture.    PATIENT SURVEYS:  FOTO 66% 01/25/23: 66%   COGNITION: Overall cognitive status: Within functional limits for tasks assessed     SENSATION: Right upper extremity feels numb with certain positions and sitting  POSTURE: Forward head posture glenohumeral internal rotation humeral head sit anteriorly scapula a AB ducted from midline Right Popeye sign  UPPER EXTREMITY ROM:   Active ROM Right eval Left eval  Shoulder flexion 155 150  Shoulder extension    Shoulder abduction 140 140  Shoulder adduction    Shoulder internal rotation Mid T  At 90 degrees of abduction, 80 T5 At 90 degrees of abduction, 60  Shoulder external rotation T3 At 90 degrees of abduction 75 T3, at 90 degrees of abduction 60 deg  Elbow flexion full Full  Elbow extension 0 0  Wrist flexion    Wrist  extension    Wrist ulnar deviation    Wrist radial deviation    Wrist pronation    Wrist supination    (Blank rows = not tested)  UPPER EXTREMITY MMT:  MMT Right eval Left eval Rt./Lt. 01/28/23  Shoulder flexion 4 4-   Shoulder extension     Shoulder abduction 4+ 4-   Shoulder adduction     Shoulder internal rotation 5 5 5, 5  Shoulder external rotation 5 at 0 deg  5 at 0 deg  4+/5 with abduction  bilateral and pain in Rt UE   Middle trapezius     Lower trapezius     Elbow flexion 5 5   Elbow extension 5 5   Wrist flexion     Wrist extension     Wrist ulnar deviation     Wrist radial deviation     Wrist pronation     Wrist supination     Grip strength (lbs)     (Blank rows = not tested)  SHOULDER SPECIAL TESTS: Rotator cuff assessment: Empty can test: positive  Biceps assessment:  Normal strength  Cervical active range of motion Flexion 58 no pain Extension 55 no pain Right sidebending 47 Left sidebending 49 Right rotation within normal limits Left rotation lacks 10 to 15% end range  Negative Spurling test bilateral Suboccipital release, manual traction felt good to patient but he did not have numbness in his right arm at that time to compare.  JOINT MOBILITY TESTING:  Posterior capsule tightness  PALPATION:  Patient sore with gross palpation to bilateral anterior shoulders Soreness noted along bilateral posterior cervical muscles and tension in upper traps   TODAY'S TREATMENT:    [facility] Adult PT Treatment:                                                DATE: 01/28/23 Therapeutic Exercise: UBE in reverse 5 min Level 1 Shoulder mobility: dowel overhead, extension and internal rotation  Abduction x 10 with dowel each side  Row 35 lbs 2 x 15  Chest press 2 sets  x 12 (35 lbs then 45 lbs) narrow/chest  Lat pull down x 12 ,  45 lbs  Prone on ball for scapular stability/upper back:  Extension , T , goal post and overhead  Prone scapular stability  Retraction  added extension  Shoulder mobility, pain with abduction and external rotation  Unable to go from neutral to overhead fully without pain  2 sets of these Sidelying scaption 3 lbs  x 15 (4 lbs 2nd set)  Sidelying External rotation 4 lbs x 15  Sidelying reverse fly 3 lbs x 12   [facility] Adult PT Treatment:                                                DATE: 01/25/23 Therapeutic Exercise: UBE level 3 2 min each way  Horiz abdct blue 10 x 2  Diagonals 10 x 2 each  Lat pull 10 x 2  ER bilat x 10 blue x 10 ER left only red x 10  Doorway Pec Stretch at 90 Degrees Abduction  Red band ER with wall slides   Protract/retract forearms on wall  Sleeper stretch on wall , left  Free motion 23# row x 10 20# pull down x 10  Plank on forearms  Qped alt UE raise Supine serratus punch  Star gazer stretch  Supine rhythmic stabilization x 30 sec each   Therapeutic Activity: FOTO 66%    [facility] Adult PT Treatment:                                                DATE: 01/22/23 Therapeutic Exercise: UBE reverse 5 min L1 Wall for upper back/posture: blue band pulls (horizontal )  Lat pull down blue band at wall  Bilat ER BTB x 15  Bent over row 15 lbs kneel on mat  x 15 Diagonal pull blue band x 10 each side Narrow looped band overhead lift and chest press x 10 each  Plank on high mat  x 30 sec Forearm plank kneeling x 30 sec + Self Care: Plan of care, gym exercises    [facility] Adult PT Treatment:                                                DATE: 01/20/23 Therapeutic Exercise: Standing shoulder rows 40# FM 10 x 2  Standing lat pull down 40# FM  Standing Green band horiz  Standing ITY GTB x 10 each  Pec stretch in doorway  Bilat ER BTB  Supine protraction Rt 4# x 20 Supine 4# Right pullover  S/L right shoulder abduction 3# x 10, 4# x 10 S/L right shoulder ER 3# 10 x 2  Sleeper Stretch 30 sec x 2 each  Star gazer stretch supine      PATIENT EDUCATION: Education details: comparison of  shoulder symptoms  Person educated: Patient Education method: Programmer, multimedia, Facilities manager, and Handouts Education comprehension: verbalized understanding, returned demonstration, and needs further education  HOME EXERCISE PROGRAM:  Access Code: [email or web address] URL: [email or web address] Date: 01/22/2023 Prepared by: Karie Mainland  Exercises - Sidelying Shoulder Abduction with Resistance to 60 Degrees  - 1-2 x daily - 7 x weekly - 2 sets - 10 reps -  5 hold - Supine Shoulder Horizontal Abduction with Resistance  - 1-2 x daily - 7 x weekly - 2 sets - 10 reps - 5 hold - Shoulder External Rotation and Scapular Retraction with Resistance  - 1-2 x daily - 7 x weekly - 2 sets - 10 reps - 5 hold - Corner Pec Minor Stretch  - 1-2 x daily - 7 x weekly - 1 sets - 3 reps - 30 hold - Doorway Pec Stretch at 90 Degrees Abduction  - 1-2 x daily - 7 x weekly - 1 sets - 3 reps - 30 hold - Shoulder Lat Pull Down with Resistance  - 1 x daily - 7 x weekly - 2 sets - 10 reps - 5 hold - Kneeling Pushup Plus on Elbows  - 1 x daily - 7 x weekly - 1 sets - 3-5 reps - 30 hold  ASSESSMENT:  CLINICAL IMPRESSION:  Patient able to perform a considerable volume of exercises today with pain no more than minimal. He had no pain after finishing the exercises.   Rt arm is more painful but Lt. UE is more limited in ROM.  Needs to work on maintaining scapular position and slowing the motions during exercises, control.   OBJECTIVE IMPAIRMENTS: decreased ROM, decreased strength, increased fascial restrictions, impaired sensation, impaired UE functional use, postural dysfunction, and pain.   ACTIVITY LIMITATIONS: carrying, lifting, sitting, and reach over head  PARTICIPATION LIMITATIONS: shopping, community activity, occupation, and yard work   PERSONAL FACTORS: Time since onset of injury/illness/exacerbation and 1-2 comorbidities: Right sided biceps tear, chronic  are also affecting patient's functional outcome.    REHAB POTENTIAL: Excellent  CLINICAL DECISION MAKING: Stable/uncomplicated  EVALUATION COMPLEXITY: Low   GOALS: Goals reviewed with patient? Yes  SHORT TERM GOALS: Target date: 01/19/2023    Patient will be independent with home exercise program Baseline: Goal status: met   2.  Patient will sit with corrected posture and make changes to his use of technology to reduce tension on neck Baseline:  01/20/23: Trying to be more mindful  Goal status: Ongoing  3.  Balance screen will be completed if needed  Baseline:  01/20/23: 20-30 sec SLS each, STS 11 sec  Goal status: INITIAL   LONG TERM GOALS: Target date: 02/16/2023    Patient will be independent with home exercise program and gym exercises upon discharge Baseline:  Goal status: INITIAL  2.  Patient will be able to demonstrate normal active range of motion in bilateral shoulders without increased pain Baseline:  Goal status: INITIAL  3.  Bilateral shoulder strength will be 5/5 in all planes in order to perform lifting and carrying activities Baseline:  Goal status: INITIAL  4.  Patient will be able to sleep on his right side without difficulty Baseline:   Goal status: INITIAL  5.  Foto score will improve to 76% Baseline: 66% 01/25/23: 66% Goal status: ONGOING  PLAN:  PT FREQUENCY: 2x/week  PT DURATION: 8 weeks6-8  PLANNED INTERVENTIONS: 97164- PT Re-evaluation, 97110-Therapeutic exercises, 97530- Therapeutic activity, 97112- Neuromuscular re-education, 97535- Self Care, 21308- Manual therapy, Patient/Family education, Balance training, Taping, Dry Needling, Joint mobilization, Cryotherapy, and Moist heat  PLAN FOR NEXT SESSION:  ask about goals .  posture scapular stabilization, UBE  Karie Mainland, PT 01/28/23 11:43 AM Phone: [phone] Fax: [phone]

## 2023-02-01 ENCOUNTER — Ambulatory Visit: Payer: 59 | Admitting: Physical Therapy

## 2023-02-02 ENCOUNTER — Ambulatory Visit (HOSPITAL_COMMUNITY)
Admission: RE | Admit: 2023-02-02 | Discharge: 2023-02-02 | Disposition: A | Discharge status: Home / Self Care | Payer: BC Managed Care – PPO | Source: Ambulatory Visit | Attending: Pulmonary Disease | Admitting: Pulmonary Disease

## 2023-02-02 ENCOUNTER — Encounter: Payer: Self-pay | Admitting: *Deleted

## 2023-02-02 DIAGNOSIS — D8685 Sarcoid myocarditis: Secondary | ICD-10-CM | POA: Insufficient documentation

## 2023-02-02 DIAGNOSIS — I340 Nonrheumatic mitral (valve) insufficiency: Secondary | ICD-10-CM | POA: Insufficient documentation

## 2023-02-02 DIAGNOSIS — Z006 Encounter for examination for normal comparison and control in clinical research program: Secondary | ICD-10-CM

## 2023-02-02 LAB — ECHOCARDIOGRAM COMPLETE
Area-P 1/2: 5.97 cm2
Calc EF: 16 %
Est EF: <20
S' Lateral: 7 cm
Single Plane A2C EF: 16 %
Single Plane A4C EF: 21.3 %

## 2023-02-02 NOTE — Therapy (Signed)
 OUTPATIENT PHYSICAL THERAPY SHOULDER TREATMENT   Patient Name: Nathan [name] MRN: [id number] DOB:[birth year], 57 y.o., male Today's Date: 02/04/2023  END OF SESSION:  PT End of Session - 02/04/23 1102     Visit Number 9    Number of Visits 16    Date for PT Re-Evaluation 02/16/23    Authorization Type [facility] [employer] blue until dec. 1    PT Start Time 1102    PT Stop Time 1144    PT Time Calculation (min) 42 min    Activity Tolerance Patient tolerated treatment well    Behavior During Therapy WFL for tasks assessed/performed                   Past Medical History:  Diagnosis Date   Allergies    09/16/2020   BPH (benign prostatic hyperplasia)    ED (erectile dysfunction)    GERD (gastroesophageal reflux disease)    Osteoarthritis    Peripheral focal chorioretinal inflammation of both eyes    Retinal vasculitis of both eyes    Sarcoidosis    Past Surgical History:  Procedure Laterality Date   BRONCHIAL NEEDLE ASPIRATION BIOPSY  10/18/2020   Procedure: BRONCHIAL NEEDLE ASPIRATION BIOPSIES;  Surgeon: Brenna Adine CROME, DO;  Location: [facility];  Service: Pulmonary;;   BRONCHIAL WASHINGS  10/18/2020   Procedure: BRONCHIAL WASHINGS;  Surgeon: Brenna Adine CROME, DO;  Location: [facility];  Service: Pulmonary;;   ICD IMPLANT N/A 10/28/2020   Procedure: ICD IMPLANT;  Surgeon: Cindie Ole DASEN, MD;  Location: [facility];  Service: Cardiovascular;  Laterality: N/A;   RIGHT/LEFT HEART CATH AND CORONARY ANGIOGRAPHY N/A 10/16/2020   Procedure: RIGHT/LEFT HEART CATH AND CORONARY ANGIOGRAPHY;  Surgeon: Anner Alm ORN, MD;  Location: [facility];  Service: Cardiovascular;  Laterality: N/A;   VIDEO BRONCHOSCOPY WITH ENDOBRONCHIAL ULTRASOUND Bilateral 10/18/2020   Procedure: VIDEO BRONCHOSCOPY WITH ENDOBRONCHIAL ULTRASOUND;  Surgeon: Brenna Adine CROME, DO;  Location: [facility];  Service: Pulmonary;  Laterality: Bilateral;   Patient Active Problem List    Diagnosis Date Noted   Biceps tendon tear 11/16/2022   Vitamin D  deficiency 08/14/2022   High risk medication use 05/25/2022   Chronic systolic heart failure (HCC) 11/04/2020   ICD (implantable cardioverter-defibrillator) in place    Preoperative examination    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 Darice Henle REFERRING PROVIDER: Burnetta Brunet, DO  REFERRING DIAG: [phone] (ICD-10-CM) - Chronic left shoulder pain M75.102 (ICD-10-CM) - Nontraumatic tear of supraspinatus tendon, left M25.511,G89.29 (ICD-10-CM) - Chronic right shoulder pain S46.219A (ICD-10-CM) - Biceps tendon tear  THERAPY DIAG:  Chronic pain of both shoulders  Abnormal posture  Rationale for Evaluation and Treatment: Rehabilitation  ONSET DATE: 12 to 18 months ago  SUBJECTIVE:  SUBJECTIVE STATEMENT: Pt estimates approx 50% improvement in his bilat shoulder pain. Tolerating sleeping on his R side better.  EVAL: Patient reports injuries over 12 months ago at separate times.  His right arm was injured about a year ago as he lifted a heavy piece of work equipment.  He had severe pain and later that night noticed a bulging bicep on that side but the pain did eventually resolve.  Sometime before that he lost his balance while walking and fell into the wall, caught himself with his left arm. Currently he reports right-sided anterior shoulder and arm pain which extends down the front of his right arm.  He reports the pain radiates from the side  of his neck down to his right elbow.  Today that area is numb.  The left shoulder is painful in the front and top kind of generally.  He reports he will have pain and crepitus when reaching certain ways.  He used to work out and performing a lateral raise on that left side was extremely painful.  He recently received a shot in the left shoulder and that has improved it quite a bit He currently has difficulty sleeping on his right side due to the pain.  He did have x-ray and ultrasound of each arm see below for results.  He has not had physical therapy before this time.   Hand dominance: Right  PERTINENT HISTORY: Sarcoidosis, defibrillator implant PAIN:  Are you having pain? Yes: NPRS scale: 0/10 Pain location: Rt superior shoulder  Pain description: numb and sore  Aggravating factors: positioning  Relieving factors: cream, changing positions helps   Yes: NPRS scale: 0/10 Pain location: Lt.UE ant  Pain description: sharp pain, aching Aggravating factors: reaching out to the side  Relieving factors: injection   PRECAUTIONS: None no electric stim due to defibrillator  RED FLAGS: None   WEIGHT BEARING RESTRICTIONS: No  FALLS:  Has patient fallen in last 6 months? No he does feel off balance sometimes.    LIVING ENVIRONMENT: Lives with: lives with their family mom with health issues .   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   PLOF: Independent, Vocation/Vocational requirements: plays every week, setting up and load/unload equipment, and Leisure: golf  He does all the cooking in the house work for his mother who is independent  with mobility  PATIENT GOALS: I want to be able to get stronger, lift weights and prevent surgery if possible.   NEXT MD VISIT: 6 weeks  OBJECTIVE:  Note: Objective measures were completed at Evaluation unless otherwise noted.  DIAGNOSTIC  FINDINGS:  Partial tearing of the proximal supraspinatus tendon without full-thickness or retraction.    Biceps tendon shows no signs of hypoechoic changes and tendon appears to be intact without any concern. AC joint does show some narrowing as well as some spurring coming off the distal clavicle, no significant effusion noted. The supraspinatus has some minor hypoechoic changes consistent with some small muscle tears in the proximal body of the muscle however the tendon insertion appears appropriate without any signs of fluid accumulation or tears. There is some mild swelling of the supraspinatus bursa. The infraspinatus has no signs of any hypoechoic changes, no tears noted of the tendon or the muscle belly. Insertion of the tendon is appropriate on its footplate.  3 view x-ray left shoulder AP, axial and Grashey view.  X-ray shows  appropriate joint space between the humeral head and the glenoid.  There is some narrowing of the AC joint, indicative of OA.  There is no cortical irregularities noted along the neck of the humerus.  There is some sclerotic changes of the humeral head on the lateral aspect of the greater tuberosity. No acute fracture.    PATIENT SURVEYS:  FOTO 66% 01/25/23: 66%   COGNITION: Overall cognitive status: Within functional limits for tasks assessed     SENSATION: Right upper extremity feels numb with certain positions and sitting  POSTURE: Forward head posture glenohumeral internal rotation humeral head sit anteriorly scapula a AB ducted from midline Right Popeye sign  UPPER EXTREMITY ROM:   Active ROM Right eval Left eval RT 02/04/23 LT 02/04/23  Shoulder flexion 155 150 155 155  Shoulder extension      Shoulder abduction 140 140 170 170  Shoulder adduction      Shoulder internal rotation Mid T  At 90 degrees of abduction, 80 T5 At 90 degrees of abduction, 60 Mid T T5  Shoulder external rotation T3 At 90 degrees of abduction 75 T3, at 90 degrees of  abduction 60 deg T5 T5  Elbow flexion full Full    Elbow extension 0 0    Wrist flexion      Wrist extension      Wrist ulnar deviation      Wrist radial deviation      Wrist pronation      Wrist supination      (Blank rows = not tested)  UPPER EXTREMITY MMT:  MMT Right eval Left eval Rt./Lt. 01/28/23  Shoulder flexion 4 4-   Shoulder extension     Shoulder abduction 4+ 4-   Shoulder adduction     Shoulder internal rotation 5 5 5, 5  Shoulder external rotation 5 at 0 deg  5 at 0 deg  4+/5 with abduction  bilateral and pain in Rt UE   Middle trapezius     Lower trapezius     Elbow flexion 5 5   Elbow extension 5 5   Wrist flexion     Wrist extension     Wrist ulnar deviation     Wrist radial deviation     Wrist pronation     Wrist supination     Grip strength (lbs)     (Blank rows = not tested)  SHOULDER SPECIAL TESTS: Rotator cuff assessment: Empty can test: positive  Biceps assessment:  Normal strength  Cervical active range of motion Flexion 58 no pain Extension 55 no pain Right sidebending 47 Left sidebending 49 Right rotation within normal limits Left rotation lacks 10 to 15% end range  Negative Spurling test bilateral Suboccipital release, manual traction felt good to patient but he did not have numbness in his right arm at that time to compare.  JOINT MOBILITY TESTING:  Posterior capsule tightness  PALPATION:  Patient sore with gross palpation to bilateral anterior shoulders Soreness noted along bilateral posterior cervical muscles and tension in upper traps   TODAY'S TREATMENT:   [facility] Adult PT Treatment:                                                DATE: 02/03/22 Therapeutic Exercise: UBE 3/3 mins Level 2 Shoulder mobility: dowel overhead, extension and internal rotation  Abduction x 10 with  dowel each side  Row 35 lbs 2 x 15  Chest press 2 sets x 15 45 lbs narrow/chest  Lat pull down  2x 15 , 45 lbs  Prone on ball for scapular stability/upper  back:  Extension , T , goal post and overhead  Prone scapular stability 2x10 Retraction added extension  Shoulder mobility, pain with abduction and external rotation  Unable to go from neutral to overhead fully without pain  2 sets of these each Sidelying scaption 5 lbs  x 15  Sidelying External rotation 5 lbs x 15  Sidelying reverse fly 3 lbs x 10   [facility] Adult PT Treatment:                                                DATE: 01/28/23 Therapeutic Exercise: UBE in reverse 5 min Level 1 Shoulder mobility: dowel overhead, extension and internal rotation  Abduction x 10 with dowel each side  Row 35 lbs 2 x 15  Chest press 2 sets  x 12 (35 lbs then 45 lbs) narrow/chest  Lat pull down x 12 , 45 lbs  Prone on ball for scapular stability/upper back:  Extension , T , goal post and overhead  2 sets of these Sidelying scaption 3 lbs  x 15 (4 lbs 2nd set)  Sidelying External rotation 4 lbs x 15  Sidelying reverse fly 3 lbs x 12   [facility] Adult PT Treatment:                                                DATE: 01/25/23 Therapeutic Exercise: UBE level 3 2 min each way  Horiz abdct blue 10 x 2  Diagonals 10 x 2 each  Lat pull 10 x 2  ER bilat x 10 blue x 10 ER left only red x 10  Doorway Pec Stretch at 90 Degrees Abduction  Red band ER with wall slides   Protract/retract forearms on wall  Sleeper stretch on wall , left  Free motion 23# row x 10 20# pull down x 10  Plank on forearms  Qped alt UE raise Supine serratus punch  Star gazer stretch  Supine rhythmic stabilization x 30 sec each   Therapeutic Activity: FOTO 66%  [facility] Adult PT Treatment:                                                DATE: 01/22/23 Therapeutic Exercise: UBE reverse 5 min L1 Wall for upper back/posture: blue band pulls (horizontal )  Lat pull down blue band at wall  Bilat ER BTB x 15  Bent over row 15 lbs kneel on mat  x 15 Diagonal pull blue band x 10 each side Narrow looped band overhead lift and chest  press x 10 each  Plank on high mat  x 30 sec Forearm plank kneeling x 30 sec + Self Care: Plan of care, gym exercises    PATIENT EDUCATION: Education details: comparison of shoulder symptoms  Person educated: Patient Education method: Explanation, Demonstration, and Handouts Education comprehension: verbalized understanding, returned demonstration, and needs further  education  HOME EXERCISE PROGRAM:  Access Code: [email or web address] URL: [email or web address] Date: 01/22/2023 Prepared by: Delon Norma  Exercises - Sidelying Shoulder Abduction with Resistance to 60 Degrees  - 1-2 x daily - 7 x weekly - 2 sets - 10 reps - 5 hold - Supine Shoulder Horizontal Abduction with Resistance  - 1-2 x daily - 7 x weekly - 2 sets - 10 reps - 5 hold - Shoulder External Rotation and Scapular Retraction with Resistance  - 1-2 x daily - 7 x weekly - 2 sets - 10 reps - 5 hold - Corner Pec Minor Stretch  - 1-2 x daily - 7 x weekly - 1 sets - 3 reps - 30 hold - Doorway Pec Stretch at 90 Degrees Abduction  - 1-2 x daily - 7 x weekly - 1 sets - 3 reps - 30 hold - Shoulder Lat Pull Down with Resistance  - 1 x daily - 7 x weekly - 2 sets - 10 reps - 5 hold - Kneeling Pushup Plus on Elbows  - 1 x daily - 7 x weekly - 1 sets - 3-5 reps - 30 hold  ASSESSMENT:  CLINICAL IMPRESSION: PT was continued for bilat shoulder mobility and strengthening. Pt's bilat shoulder ROM is improving with no increase in pain, LTG #2 met. Overall pain is improving and pt is tolerating sleeping on his R side better. Will assess strength the next PT session. Will discuss with pt continuation of PT through his POC vs. DC. Pt tolerated PT today c progressive strengthening demand without adverse effects.  OBJECTIVE IMPAIRMENTS: decreased ROM, decreased strength, increased fascial restrictions, impaired sensation, impaired UE functional use, postural dysfunction, and pain.   ACTIVITY LIMITATIONS: carrying, lifting, sitting, and  reach over head  PARTICIPATION LIMITATIONS: shopping, community activity, occupation, and yard work   PERSONAL FACTORS: Time since onset of injury/illness/exacerbation and 1-2 comorbidities: Right sided biceps tear, chronic  are also affecting patient's functional outcome.   REHAB POTENTIAL: Excellent  CLINICAL DECISION MAKING: Stable/uncomplicated  EVALUATION COMPLEXITY: Low   GOALS: Goals reviewed with patient? Yes  SHORT TERM GOALS: Target date: 01/19/2023    Patient will be independent with home exercise program Baseline: Goal status: met   2.  Patient will sit with corrected posture and make changes to his use of technology to reduce tension on neck Baseline:  01/20/23: Trying to be more mindful  Goal status: Ongoing  3.  Balance screen will be completed if needed  Baseline:  01/20/23: 20-30 sec SLS each, STS 11 sec  Goal status: INITIAL   LONG TERM GOALS: Target date: 02/16/2023    Patient will be independent with home exercise program and gym exercises upon discharge Baseline:  Goal status: Ongoing  2.  Patient will be able to demonstrate normal active range of motion in bilateral shoulders without increased pain Baseline:  02/04/23: Normal ranges s increase in pin, see flow sheets Goal status: MET  3.  Bilateral shoulder strength will be 5/5 in all planes in order to perform lifting and carrying activities Baseline:  Goal status: INITIAL  4.  Patient will be able to sleep on his right side without difficulty Baseline:  02/04/23: Better, does so intermittently. 50% in pain overall. Goal status: Improved  5.  Foto score will improve to 76% Baseline: 66% 01/25/23: 66% Goal status: ONGOING  PLAN:  PT FREQUENCY: 2x/week  PT DURATION: 8 weeks6-8  PLANNED INTERVENTIONS: 97164- PT Re-evaluation, 97110-Therapeutic exercises, 97530- Therapeutic activity, [id number]- Neuromuscular re-education, 97535- Self Care,  02859- Manual therapy, Patient/Family education,  Balance training, Taping, Dry Needling, Joint mobilization, Cryotherapy, and Moist heat  PLAN FOR NEXT SESSION:  ask about goals .  posture scapular stabilization, UBE  Francely Craw MS, PT 02/04/23 11:57 AM

## 2023-02-02 NOTE — Research (Signed)
 SITE: [id number]     Subject #[id number]    Subprotocol: A  Inclusion Criteria  Patients who meet all of the following criteria are eligible for enrollment as study participants:  Yes No  Age > 55 years old X   Eligible to wear Holter Study X    Exclusion Criteria  Patients who meet any of these criteria are not eligible for enrollment as study participants: Yes No  1. Receiving any mechanical (respiratory or circulatory) or renal support therapy at Screening or during Visit #1.  X  2.  Any other conditions that in the opinion of the investigators are likely to prevent compliance with the study protocol or pose a safety concern if the subject participates in the study.  X  3. Poor tolerance, namely susceptible to severe skin allergies from ECG adhesive patch application.  X   Protocol: REV H                                     Residential Zip code*  [address] (First 3 digits ONLY)                                            [facility] Informed Consent   Subject Name: Nathan [name]  Subject met inclusion and exclusion criteria.  The informed consent form, study requirements and expectations were reviewed with the subject. Subject had opportunity to read consent and questions and concerns were addressed prior to the signing of the consent form.  The subject verbalized understanding of the trial requirements.  The subject agreed to participate in the EF-ACT trial and signed the informed consent at 1100 on 02/02/2023.  The informed consent was obtained prior to performance of any protocol-specific procedures for the subject.  A copy of the signed informed consent was given to the subject and a copy was placed in the subject's medical record.   Delon Fetters          Current Outpatient Medications:    amiodarone  (PACERONE ) 200 MG tablet, Take 1 tablet (200 mg total) by mouth daily at 12 noon. Monday-Friday only, Disp: 30 tablet, Rfl: 6   Blood Pressure Monitoring (OMRON 3 SERIES BP MONITOR) DEVI, Use as  directed, Disp: 1 each, Rfl: 0   carvedilol  (COREG ) 3.125 MG tablet, Take 1 tablet (3.125 mg total) by mouth 2 (two) times daily., Disp: 60 tablet, Rfl: 11   cetirizine (ZYRTEC) 10 MG tablet, Take 10 mg by mouth as needed., Disp: , Rfl:    dapagliflozin  propanediol (FARXIGA ) 10 MG TABS tablet, Take 1 tablet (10 mg total) by mouth daily., Disp: 90 tablet, Rfl: 3   diclofenac  Sodium (VOLTAREN ) 1 % GEL, Apply 2 g topically 4 (four) times daily as needed., Disp: 200 g, Rfl: 1   folic acid  (FOLVITE ) 1 MG tablet, Take by mouth., Disp: , Rfl:    furosemide  (LASIX ) 20 MG tablet, Take 1 tablet (20 mg total) by mouth daily as needed., Disp: 30 tablet, Rfl: 3   inFLIXimab -axxq (AVSOLA  IV), Inject into the vein. 3mg /kg at Week 0, 2, and 6, then 3mg /kg every 8 weeks thereafter, Disp: , Rfl:    losartan  (COZAAR ) 25 MG tablet, Take 1/2 tablet (12.5 mg total) by mouth daily. NEEDS FOLLOW UP APPOINTMENT FOR MORE REFILLS, Disp: 45 tablet, Rfl: 0  Magnesium  Oxide 250 MG TABS, take 1 tablet by mouth every day as needed, Disp: 90 tablet, Rfl: 0   metFORMIN  (GLUCOPHAGE -XR) 500 MG 24 hr tablet, Take 1 tablet (500 mg total) by mouth daily with evening meal, Disp: 90 tablet, Rfl: 0   methotrexate  (RHEUMATREX) 2.5 MG tablet, Take 8 tablets (20 mg total) by mouth as directed every Wednesday. Caution: Chemotherapy, protect from light., Disp: 96 tablet, Rfl: 1   Multiple Vitamin (MULTIVITAMIN ADULT PO), Take by mouth., Disp: , Rfl:    omeprazole  (PRILOSEC) 40 MG capsule, Take 1 capsule (40 mg total) by mouth every morning., Disp: 90 capsule, Rfl: 3   sildenafil  (REVATIO ) 20 MG tablet, Take 2-3 tablets (40-60 mg total) by mouth as needed., Disp: 50 tablet, Rfl: 0   spironolactone  (ALDACTONE ) 25 MG tablet, Take 1 tablet (25 mg total) by mouth daily., Disp: 90 tablet, Rfl: 3   tamsulosin  (FLOMAX ) 0.4 MG CAPS capsule, Take 1 capsule (0.4 mg total) by mouth at bedtime. (Patient not taking: Reported on 12/22/2022), Disp: 90 capsule,  Rfl: 0   tamsulosin  (FLOMAX ) 0.4 MG CAPS capsule, Take 1 capsule (0.4 mg total) by mouth at bedtime., Disp: 90 capsule, Rfl: 0

## 2023-02-04 ENCOUNTER — Ambulatory Visit: Payer: BC Managed Care – PPO | Attending: Sports Medicine

## 2023-02-04 ENCOUNTER — Encounter: Payer: Self-pay | Admitting: Pulmonary Disease

## 2023-02-04 DIAGNOSIS — M25512 Pain in left shoulder: Secondary | ICD-10-CM | POA: Insufficient documentation

## 2023-02-04 DIAGNOSIS — M25511 Pain in right shoulder: Secondary | ICD-10-CM | POA: Diagnosis present

## 2023-02-04 DIAGNOSIS — G8929 Other chronic pain: Secondary | ICD-10-CM | POA: Diagnosis present

## 2023-02-04 DIAGNOSIS — R293 Abnormal posture: Secondary | ICD-10-CM | POA: Diagnosis present

## 2023-02-05 ENCOUNTER — Other Ambulatory Visit (HOSPITAL_COMMUNITY): Payer: Self-pay

## 2023-02-05 ENCOUNTER — Encounter: Payer: Self-pay | Admitting: Pulmonary Disease

## 2023-02-08 ENCOUNTER — Encounter: Payer: Self-pay | Admitting: Physical Therapy

## 2023-02-08 ENCOUNTER — Ambulatory Visit: Payer: Medicaid Other | Admitting: Physical Therapy

## 2023-02-08 DIAGNOSIS — R293 Abnormal posture: Secondary | ICD-10-CM

## 2023-02-08 DIAGNOSIS — G8929 Other chronic pain: Secondary | ICD-10-CM

## 2023-02-08 DIAGNOSIS — M25511 Pain in right shoulder: Secondary | ICD-10-CM | POA: Diagnosis not present

## 2023-02-08 DIAGNOSIS — M25512 Pain in left shoulder: Secondary | ICD-10-CM | POA: Diagnosis not present

## 2023-02-08 NOTE — Therapy (Signed)
 OUTPATIENT PHYSICAL THERAPY SHOULDER TREATMENT   Progress Note  Reporting Period 12/22/22 to 02/08/23  See note below for Objective Data and Assessment of Progress/Goals.      Patient Name: Nathan [name] MRN: [id number] DOB:November 28, 1965, 58 y.o., male Today's Date: 02/08/2023  END OF SESSION:  PT End of Session - 02/08/23 1057     Visit Number 10    Number of Visits 16    Date for PT Re-Evaluation 02/16/23    Authorization Type [facility] [employer] blue until dec. 1    PT Start Time 1100    PT Stop Time 1145    PT Time Calculation (min) 45 min                    Past Medical History:  Diagnosis Date   Allergies    09/16/2020   BPH (benign prostatic hyperplasia)    ED (erectile dysfunction)    GERD (gastroesophageal reflux disease)    Osteoarthritis    Peripheral focal chorioretinal inflammation of both eyes    Retinal vasculitis of both eyes    Sarcoidosis    Past Surgical History:  Procedure Laterality Date   BRONCHIAL NEEDLE ASPIRATION BIOPSY  10/18/2020   Procedure: BRONCHIAL NEEDLE ASPIRATION BIOPSIES;  Surgeon: Brenna Adine CROME, DO;  Location: [facility];  Service: Pulmonary;;   BRONCHIAL WASHINGS  10/18/2020   Procedure: BRONCHIAL WASHINGS;  Surgeon: Brenna Adine CROME, DO;  Location: [facility];  Service: Pulmonary;;   ICD IMPLANT N/A 10/28/2020   Procedure: ICD IMPLANT;  Surgeon: Cindie Ole DASEN, MD;  Location: [facility];  Service: Cardiovascular;  Laterality: N/A;   RIGHT/LEFT HEART CATH AND CORONARY ANGIOGRAPHY N/A 10/16/2020   Procedure: RIGHT/LEFT HEART CATH AND CORONARY ANGIOGRAPHY;  Surgeon: Anner Alm ORN, MD;  Location: [facility] INVASIVE CV LAB;  Service: Cardiovascular;  Laterality: N/A;   VIDEO BRONCHOSCOPY WITH ENDOBRONCHIAL ULTRASOUND Bilateral 10/18/2020   Procedure: VIDEO BRONCHOSCOPY WITH ENDOBRONCHIAL ULTRASOUND;  Surgeon: Brenna Adine CROME, DO;  Location: [facility];  Service: Pulmonary;  Laterality: Bilateral;   Patient  Active Problem List   Diagnosis Date Noted   Biceps tendon tear 11/16/2022   Vitamin D  deficiency 08/14/2022   High risk medication use 05/25/2022   Chronic systolic heart failure (HCC) 11/04/2020   ICD (implantable cardioverter-defibrillator) in place    Preoperative examination    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 Darice Henle REFERRING PROVIDER: Burnetta Brunet, DO  REFERRING DIAG: [phone] (ICD-10-CM) - Chronic left shoulder pain M75.102 (ICD-10-CM) - Nontraumatic tear of supraspinatus tendon, left M25.511,G89.29 (ICD-10-CM) - Chronic right shoulder pain S46.219A (ICD-10-CM) - Biceps tendon tear  THERAPY DIAG:  Chronic pain of both shoulders  Abnormal posture  Rationale for Evaluation and Treatment: Rehabilitation  ONSET DATE: 12 to 18 months ago  SUBJECTIVE:  SUBJECTIVE STATEMENT: Not having pain today.  Has some pain with the Rt arm reaching out and back. Rarely has numbness any more.  Is he sleeps to long on the Rt he get achy.   EVAL: Patient reports injuries over 12 months ago at separate times.  His right arm was injured about a year ago as he lifted a heavy piece of work equipment.  He had severe pain and later that night noticed a bulging bicep on that side but the pain did eventually resolve.  Sometime before that he lost his balance while walking and fell into the wall, caught himself with his left arm. Currently he reports right-sided anterior shoulder and arm pain which extends down the  front of his right arm.  He reports the pain radiates from the side of his neck down to his right elbow.  Today that area is numb.  The left shoulder is painful in the front and top kind of generally.  He reports he will have pain and crepitus when reaching certain ways.  He used to work out and performing a lateral raise on that left side was extremely painful.  He recently received a shot in the left shoulder and that has improved it quite a bit He currently has difficulty sleeping on his right side due to the pain.  He did have x-ray and ultrasound of each arm see below for results.  He has not had physical therapy before this time.   Hand dominance: Right  PERTINENT HISTORY: Sarcoidosis, defibrillator implant PAIN:  Are you having pain? Yes: NPRS scale: 0/10 Pain location: Rt superior shoulder  Pain description: numb and sore  Aggravating factors: positioning  Relieving factors: cream, changing positions helps   Yes: NPRS scale: 0/10 Pain location: Lt.UE ant  Pain description: sharp pain, aching Aggravating factors: reaching out to the side  Relieving factors: injection   PRECAUTIONS: None no electric stim due to defibrillator  RED FLAGS: None   WEIGHT BEARING RESTRICTIONS: No  FALLS:  Has patient fallen in last 6 months? No he does feel off balance sometimes.    LIVING ENVIRONMENT: Lives with: lives with their family mom with health issues .   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   PLOF: Independent, Vocation/Vocational requirements: plays every week, setting up and load/unload equipment, and Leisure: golf  He does all the cooking in the house work for his mother who is independent  with mobility  PATIENT GOALS: I want to be able to get stronger, lift weights and prevent surgery if possible.   NEXT MD VISIT: 6 weeks  OBJECTIVE:  Note: Objective measures  were completed at Evaluation unless otherwise noted.  DIAGNOSTIC FINDINGS:  Partial tearing of the proximal supraspinatus tendon without full-thickness or retraction.    Biceps tendon shows no signs of hypoechoic changes and tendon appears to be intact without any concern. AC joint does show some narrowing as well as some spurring coming off the distal clavicle, no significant effusion noted. The supraspinatus has some minor hypoechoic changes consistent with some small muscle tears in the proximal body of the muscle however the tendon insertion appears appropriate without any signs of fluid accumulation or tears. There is some mild swelling of the supraspinatus bursa. The infraspinatus has no signs of any hypoechoic changes, no tears noted of the tendon or the muscle belly. Insertion of the tendon is appropriate  on its footplate.  3 view x-ray left shoulder AP, axial and Grashey view.  X-ray shows appropriate joint space between the humeral head and the glenoid.  There is some narrowing of the AC joint, indicative of OA.  There is no cortical irregularities noted along the neck of the humerus.  There is some sclerotic changes of the humeral head on the lateral aspect of the greater tuberosity. No acute fracture.    PATIENT SURVEYS:  FOTO 66% 01/25/23: 66%   COGNITION: Overall cognitive status: Within functional limits for tasks assessed     SENSATION: Right upper extremity feels numb with certain positions and sitting  POSTURE: Forward head posture glenohumeral internal rotation humeral head sit anteriorly scapula a AB ducted from midline Right Popeye sign  UPPER EXTREMITY ROM:   Active ROM Right eval Left eval RT 02/04/23 LT 02/04/23  Shoulder flexion 155 150 155 155  Shoulder extension      Shoulder abduction 140 140 170 170  Shoulder adduction      Shoulder internal rotation Mid T  At 90 degrees of abduction, 80 T5 At 90 degrees of abduction, 60 Mid T T5  Shoulder external  rotation T3 At 90 degrees of abduction 75 T3, at 90 degrees of abduction 60 deg T5 T5  Elbow flexion full Full    Elbow extension 0 0    Wrist flexion      Wrist extension      Wrist ulnar deviation      Wrist radial deviation      Wrist pronation      Wrist supination      (Blank rows = not tested)  UPPER EXTREMITY MMT:  MMT Right eval Left eval Rt./Lt. 01/28/23 Rt./Lt.  02/08/23  Shoulder flexion 4 4-  5, 4+  Shoulder extension      Shoulder abduction 4+ 4-  5, 4+  Shoulder adduction      Shoulder internal rotation 5 5 5, 5   Shoulder external rotation 5 at 0 deg  5 at 0 deg  4+/5 with abduction  bilateral and pain in Rt UE    Middle trapezius      Lower trapezius      Elbow flexion 5 5    Elbow extension 5 5    Wrist flexion      Wrist extension      Wrist ulnar deviation      Wrist radial deviation      Wrist pronation      Wrist supination      Grip strength (lbs)      (Blank rows = not tested)  SHOULDER SPECIAL TESTS: Rotator cuff assessment: Empty can test: positive  Biceps assessment:  Normal strength  Cervical active range of motion/RE-TEST on 02/08/23 in degrees  Flexion 58 no pain/ 60 Extension 55 no pain/56 Right sidebending 47/50 Left sidebending 49/52 Right rotation within normal limits/WNL Left rotation lacks 10 to 15% end range/WNL   Negative Spurling test bilateral Suboccipital release, manual traction felt good to patient but he did not have numbness in his right arm at that time to compare.  JOINT MOBILITY TESTING:  Posterior capsule tightness  PALPATION:  Patient sore with gross palpation to bilateral anterior shoulders Soreness noted along bilateral posterior cervical muscles and tension in upper traps   TODAY'S TREATMENT:    [facility] Adult PT Treatment:  DATE: 02/08/23 Therapeutic Exercise: UBE 3/3 level 3 Cervical ROM done  Blue theraband horizontal pulls, stars MMT  Standing forward and  lateral raise, 6 lbs x 10, 2 sets  Unable to do overhead press on L UE  Biceps 10 lbs each x 15  Bent over row 15 lbs 1 knee on bench x each side  Skull crusher 10 lbs each side x 15  Sidelying side plank 30 sec x 2 Scaption  6 lbs x 12 ER 6 lbs x 10-15 (15 on Rt)  Self care: POC   [facility] Adult PT Treatment:                                                DATE: 02/03/22 Therapeutic Exercise: UBE 3/3 mins Level 2 Shoulder mobility: dowel overhead, extension and internal rotation  Abduction x 10 with dowel each side  Row 35 lbs 2 x 15  Chest press 2 sets x 15 45 lbs narrow/chest  Lat pull down  2x 15 , 45 lbs  Prone on ball for scapular stability/upper back:  Extension , T , goal post and overhead  Prone scapular stability 2x10 Retraction added extension  Shoulder mobility, pain with abduction and external rotation  Unable to go from neutral to overhead fully without pain  2 sets of these each Sidelying scaption 5 lbs  x 15  Sidelying External rotation 5 lbs x 15  Sidelying reverse fly 3 lbs x 10   [facility] Adult PT Treatment:                                                DATE: 01/28/23 Therapeutic Exercise: UBE in reverse 5 min Level 1 Shoulder mobility: dowel overhead, extension and internal rotation  Abduction x 10 with dowel each side  Row 35 lbs 2 x 15  Chest press 2 sets  x 12 (35 lbs then 45 lbs) narrow/chest  Lat pull down x 12 , 45 lbs  Prone on ball for scapular stability/upper back:  Extension , T , goal post and overhead  2 sets of these Sidelying scaption 3 lbs  x 15 (4 lbs 2nd set)  Sidelying External rotation 4 lbs x 15  Sidelying reverse fly 3 lbs x 12   [facility] Adult PT Treatment:                                                DATE: 01/25/23 Therapeutic Exercise: UBE level 3 2 min each way  Horiz abdct blue 10 x 2  Diagonals 10 x 2 each  Lat pull 10 x 2  ER bilat x 10 blue x 10 ER left only red x 10  Doorway Pec Stretch at 90 Degrees Abduction  Red band ER  with wall slides   Protract/retract forearms on wall  Sleeper stretch on wall , left  Free motion 23# row x 10 20# pull down x 10  Plank on forearms  Qped alt UE raise Supine serratus punch  Star gazer stretch  Supine rhythmic stabilization x 30 sec each   Therapeutic Activity: FOTO  66%  [facility] Adult PT Treatment:                                                DATE: 01/22/23 Therapeutic Exercise: UBE reverse 5 min L1 Wall for upper back/posture: blue band pulls (horizontal )  Lat pull down blue band at wall  Bilat ER BTB x 15  Bent over row 15 lbs kneel on mat  x 15 Diagonal pull blue band x 10 each side Narrow looped band overhead lift and chest press x 10 each  Plank on high mat  x 30 sec Forearm plank kneeling x 30 sec + Self Care: Plan of care, gym exercises    PATIENT EDUCATION: Education details: comparison of shoulder symptoms  Person educated: Patient Education method: Explanation, Demonstration, and Handouts Education comprehension: verbalized understanding, returned demonstration, and needs further education  HOME EXERCISE PROGRAM:  Access Code: [email or web address] URL: [email or web address] Date: 01/22/2023 Prepared by: Delon Norma  Exercises - Sidelying Shoulder Abduction with Resistance to 60 Degrees  - 1-2 x daily - 7 x weekly - 2 sets - 10 reps - 5 hold - Supine Shoulder Horizontal Abduction with Resistance  - 1-2 x daily - 7 x weekly - 2 sets - 10 reps - 5 hold - Shoulder External Rotation and Scapular Retraction with Resistance  - 1-2 x daily - 7 x weekly - 2 sets - 10 reps - 5 hold - Corner Pec Minor Stretch  - 1-2 x daily - 7 x weekly - 1 sets - 3 reps - 30 hold - Doorway Pec Stretch at 90 Degrees Abduction  - 1-2 x daily - 7 x weekly - 1 sets - 3 reps - 30 hold - Shoulder Lat Pull Down with Resistance  - 1 x daily - 7 x weekly - 2 sets - 10 reps - 5 hold - Kneeling Pushup Plus on Elbows  - 1 x daily - 7 x weekly - 1 sets - 3-5 reps - 30  hold  ASSESSMENT:  CLINICAL IMPRESSION: PT was continued for bilat shoulder mobility and strengthening.Overall pain is improving and pt is tolerating sleeping on his R side better. Will assess strength the next PT session. Patient and PT discussed plan to DC after next session.  Sees MD on 02/16/23.  Pt tolerated PT today c progressive strengthening demand without adverse effects. He has a solid HEP and goes to [employer].  He shows increased weakness and decr ROM in L UE external rotation but otherwise he is stronger and more functional.   OBJECTIVE IMPAIRMENTS: decreased ROM, decreased strength, increased fascial restrictions, impaired sensation, impaired UE functional use, postural dysfunction, and pain.   ACTIVITY LIMITATIONS: carrying, lifting, sitting, and reach over head  PARTICIPATION LIMITATIONS: shopping, community activity, occupation, and yard work   PERSONAL FACTORS: Time since onset of injury/illness/exacerbation and 1-2 comorbidities: Right sided biceps tear, chronic  are also affecting patient's functional outcome.   REHAB POTENTIAL: Excellent  CLINICAL DECISION MAKING: Stable/uncomplicated  EVALUATION COMPLEXITY: Low   GOALS: Goals reviewed with patient? Yes  SHORT TERM GOALS: Target date: 01/19/2023    Patient will be independent with home exercise program Baseline: Goal status: met   2.  Patient will sit with corrected posture and make changes to his use of technology to reduce tension on neck Baseline:  01/20/23: Trying to be more mindful  Goal status: MET   3.  Balance screen will be completed if needed  Baseline:  01/20/23: 20-30 sec SLS each, STS 11 sec  Goal status: INITIAL   LONG TERM GOALS: Target date: 02/16/2023    Patient will be independent with home exercise program and gym exercises upon discharge Baseline:  Goal status: Ongoing  2.  Patient will be able to demonstrate normal active range of motion in bilateral shoulders without  increased pain Baseline:  02/04/23: Normal ranges increase in pain, see flow sheets Goal status: MET  3.  Bilateral shoulder strength will be 5/5 in all planes in order to perform lifting and carrying activities Baseline:  4+/5 on L Goal status: ongoing  4.  Patient will be able to sleep on his right side without difficulty Baseline:  02/04/23: Better, does so intermittently. 50% in pain overall. Goal status: Improved quite a  bit   5.  Foto score will improve to 76% Baseline: 66% 01/25/23: 66% Goal status: ONGOING  PLAN:  PT FREQUENCY: 2x/week  PT DURATION: 8 weeks6-8  PLANNED INTERVENTIONS: [id number]- PT Re-evaluation, 97110-Therapeutic exercises, 97530- Therapeutic activity, 97112- Neuromuscular re-education, 97535- Self Care, [id number]- Manual therapy, Patient/Family education, Balance training, Taping, Dry Needling, Joint mobilization, Cryotherapy, and Moist heat  PLAN FOR NEXT SESSION:  full HEP review (ER, abduction LUE ) posture scapular stabilization, UBE. FOTO and DCSABRA Delon Norma, PT 02/08/23 11:34 AM Phone: [phone] Fax: [phone]

## 2023-02-10 ENCOUNTER — Ambulatory Visit: Payer: BC Managed Care – PPO | Admitting: Physical Therapy

## 2023-02-10 DIAGNOSIS — M25511 Pain in right shoulder: Secondary | ICD-10-CM | POA: Diagnosis not present

## 2023-02-10 DIAGNOSIS — G8929 Other chronic pain: Secondary | ICD-10-CM

## 2023-02-10 DIAGNOSIS — R293 Abnormal posture: Secondary | ICD-10-CM

## 2023-02-10 DIAGNOSIS — M25512 Pain in left shoulder: Secondary | ICD-10-CM | POA: Diagnosis not present

## 2023-02-10 NOTE — Therapy (Signed)
 OUTPATIENT PHYSICAL THERAPY SHOULDER TREATMENT  Patient Name: Nathan [name] MRN: [id number] DOB:10-25-1965, 58 y.o., male Today's Date: 02/10/2023     PHYSICAL THERAPY DISCHARGE SUMMARY  Visits from Start of Care: 11  Current functional level related to goals / functional outcomes: Limited overhead press L UE , Pain with reaching back   Remaining deficits: L shoulder strength in ER/ABD   Education / Equipment: HEP, gym, posture, RICE   Patient agrees to discharge. Patient goals were partially met. Patient is being discharged due to maximized rehab potential.      END OF SESSION:  PT End of Session - 02/10/23 1110     Visit Number 11    Number of Visits 16    Date for PT Re-Evaluation 02/16/23    Authorization Type [facility] [employer] blue until dec. 1    PT Start Time 1103    PT Stop Time 1153    PT Time Calculation (min) 50 min    Activity Tolerance Patient tolerated treatment well    Behavior During Therapy WFL for tasks assessed/performed                     Past Medical History:  Diagnosis Date   Allergies    09/16/2020   BPH (benign prostatic hyperplasia)    ED (erectile dysfunction)    GERD (gastroesophageal reflux disease)    Osteoarthritis    Peripheral focal chorioretinal inflammation of both eyes    Retinal vasculitis of both eyes    Sarcoidosis    Past Surgical History:  Procedure Laterality Date   BRONCHIAL NEEDLE ASPIRATION BIOPSY  10/18/2020   Procedure: BRONCHIAL NEEDLE ASPIRATION BIOPSIES;  Surgeon: Brenna Adine CROME, DO;  Location: [facility];  Service: Pulmonary;;   BRONCHIAL WASHINGS  10/18/2020   Procedure: BRONCHIAL WASHINGS;  Surgeon: Brenna Adine CROME, DO;  Location: [facility];  Service: Pulmonary;;   ICD IMPLANT N/A 10/28/2020   Procedure: ICD IMPLANT;  Surgeon: Cindie Ole DASEN, MD;  Location: [facility];  Service: Cardiovascular;  Laterality: N/A;   RIGHT/LEFT HEART CATH AND CORONARY ANGIOGRAPHY N/A  10/16/2020   Procedure: RIGHT/LEFT HEART CATH AND CORONARY ANGIOGRAPHY;  Surgeon: Anner Alm ORN, MD;  Location: [facility] INV[facility] CV LAB;  Service: Cardiovascular;  Laterality: N/A;   VIDEO BRONCHOSCOPY WITH ENDOBRONCHIAL ULTRASOUND Bilateral 10/18/2020   Procedure: VIDEO BRONCHOSCOPY WITH ENDOBRONCHIAL ULTRASOUND;  Surgeon: Brenna Adine CROME, DO;  Location: [facility];  Service: Pulmonary;  Laterality: Bilateral;   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 Darice Henle REFERRING PROVIDER: Burnetta Brunet, DO  REFERRING DIAG: [id number] (ICD-10-CM) - Chronic left shoulder pain M75.102 (ICD-10-CM) - Nontraumatic tear of supraspinatus tendon, left M25.511,G89.29 (ICD-10-CM) - Chronic right shoulder pain S46.219A (ICD-10-CM) - Biceps tendon tear  THERAPY DIAG:  Chronic pain of both shoulders  Abnormal posture  Rationale for Evaluation and Treatment: Rehabilitation  ONSET DATE: 12 to  18 months ago  SUBJECTIVE:                                                                                                                                                                                      SUBJECTIVE STATEMENT: No pain.  He has been to the gym.  He is agreeable to DC    EVAL: Patient reports injuries over 12 months ago at separate times.  His right arm was injured about a year ago as he lifted a heavy piece  of work equipment.  He had severe pain and later that night noticed a bulging bicep on that side but the pain did eventually resolve.  Sometime before that he lost his balance while walking and fell into the wall, caught himself with his left arm. Currently he reports right-sided anterior shoulder and arm pain which extends down the front of his right arm.  He reports the pain radiates from the side of his neck down to his right elbow.  Today that area is numb.  The left shoulder is painful in the front and top kind of generally.  He reports he will have pain and crepitus when reaching certain ways.  He used to work out and performing a lateral raise on that left side was extremely painful.  He recently received a shot in the left shoulder and that has improved it quite a bit He currently has difficulty sleeping on his right side due to the pain.  He did have x-ray and ultrasound of each arm see below for results.  He has not had physical therapy before this time.   Hand dominance: Right  PERTINENT HISTORY: Sarcoidosis, defibrillator implant PAIN:  Are you having pain? Yes: NPRS scale: 0/10 Pain location: Rt superior shoulder  Pain description: numb and sore  Aggravating factors: positioning  Relieving factors: cream, changing positions helps   Yes: NPRS scale: 0/10 Pain location: Lt.UE ant  Pain description: sharp pain, aching Aggravating factors: reaching out to the side  Relieving factors: injection   PRECAUTIONS: None no electric stim due to defibrillator  RED FLAGS: None   WEIGHT BEARING RESTRICTIONS: No  FALLS:  Has patient fallen in last 6 months? No he does feel off balance sometimes.    LIVING ENVIRONMENT: Lives with: lives with their family mom with health issues .   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   PLOF: Independent, Vocation/Vocational  requirements: plays every week, setting up and load/unload equipment, and Leisure: golf  He does all the cooking in the house work for  his mother who is independent  with mobility  PATIENT GOALS: I want to be able to get stronger, lift weights and prevent surgery if possible.   NEXT MD VISIT: 6 weeks  OBJECTIVE:  Note: Objective measures were completed at Evaluation unless otherwise noted.  DIAGNOSTIC FINDINGS:  Partial tearing of the proximal supraspinatus tendon without full-thickness or retraction.    Biceps tendon shows no signs of hypoechoic changes and tendon appears to be intact without any concern. AC joint does show some narrowing as well as some spurring coming off the distal clavicle, no significant effusion noted. The supraspinatus has some minor hypoechoic changes consistent with some small muscle tears in the proximal body of the muscle however the tendon insertion appears appropriate without any signs of fluid accumulation or tears. There is some mild swelling of the supraspinatus bursa. The infraspinatus has no signs of any hypoechoic changes, no tears noted of the tendon or the muscle belly. Insertion of the tendon is appropriate on its footplate.  3 view x-ray left shoulder AP, axial and Grashey view.  X-ray shows appropriate joint space between the humeral head and the glenoid.  There is some narrowing of the AC joint, indicative of OA.  There is no cortical irregularities noted along the neck of the humerus.  There is some sclerotic changes of the humeral head on the lateral aspect of the greater tuberosity. No acute fracture.    PATIENT SURVEYS:  FOTO 66% 01/25/23: 66%  02/10/23: 66% DC   COGNITION: Overall cognitive status: Within functional limits for tasks assessed     SENSATION: Right upper extremity feels numb with certain positions and sitting  POSTURE: Forward head posture glenohumeral internal rotation humeral head sit anteriorly scapula a AB ducted from  midline Right Popeye sign  UPPER EXTREMITY ROM:   Active ROM Right eval Left eval RT 02/04/23 LT 02/04/23  Shoulder flexion 155 150 155 155  Shoulder extension      Shoulder abduction 140 140 170 170  Shoulder adduction      Shoulder internal rotation Mid T  At 90 degrees of abduction, 80 T5 At 90 degrees of abduction, 60 Mid T T5  Shoulder external rotation T3 At 90 degrees of abduction 75 T3, at 90 degrees of abduction 60 deg T5 T5  Elbow flexion full Full    Elbow extension 0 0    Wrist flexion      Wrist extension      Wrist ulnar deviation      Wrist radial deviation      Wrist pronation      Wrist supination      (Blank rows = not tested)  UPPER EXTREMITY MMT:  MMT Right eval Left eval Rt./Lt. 01/28/23 Rt./Lt.  02/08/23  Shoulder flexion 4 4-  5, 4+  Shoulder extension      Shoulder abduction 4+ 4-  5, 4+  Shoulder adduction      Shoulder internal rotation 5 5 5, 5   Shoulder external rotation 5 at 0 deg  5 at 0 deg  4+/5 with abduction  bilateral and pain in Rt UE    Middle trapezius      Lower trapezius      Elbow flexion 5 5    Elbow extension 5 5    Wrist flexion      Wrist extension      Wrist ulnar deviation      Wrist radial deviation      Wrist pronation  Wrist supination      Grip strength (lbs)      (Blank rows = not tested)  SHOULDER SPECIAL TESTS: Rotator cuff assessment: Empty can test: positive  Biceps assessment:  Normal strength  Cervical active range of motion/RE-TEST on 02/08/23 in degrees  Flexion 58 no pain/ 60 Extension 55 no pain/56 Right sidebending 47/50 Left sidebending 49/52 Right rotation within normal limits/WNL Left rotation lacks 10 to 15% end range/WNL   Negative Spurling test bilateral Suboccipital release, manual traction felt good to patient but he did not have numbness in his right arm at that time to compare.  JOINT MOBILITY TESTING:  Posterior capsule tightness  PALPATION:  Patient sore with gross  palpation to bilateral anterior shoulders Soreness noted along bilateral posterior cervical muscles and tension in upper traps   TODAY'S TREATMENT:     [facility] Adult PT Treatment:                                                DATE: 02/10/23 Therapeutic Exercise: UBE reverse 5 min L1 Freemotion (FM): row 2 x 15 , palms up/palms down 5 plates FM shoulder extension 2 x 15, 3 plates  FM diagonal pull x 10 2 plates  External rotation 1 plate x 15  Horizontal pull 1 plate 1 x 10  Combo 1 plate abduction/ER x 10 Rt  Sidelying reverse fly x 15 , 5 lbs  Sidelying scaption x 15 , 5 lbs  Sidelying 5 lbs x 15 , 5 lbs  Self Care: Progression of strength exercises, gym exercises, avoid heavy overhead lifting   [facility] Adult PT Treatment:                                                DATE: 02/08/23 Therapeutic Exercise: UBE 3/3 level 3 Cervical ROM done  Blue theraband horizontal pulls, stars MMT  Standing forward and lateral raise, 6 lbs x 10, 2 sets  Unable to do overhead press on L UE  Biceps 10 lbs each x 15  Bent over row 15 lbs 1 knee on bench x each side  Skull crusher 10 lbs each side x 15  Sidelying side plank 30 sec x 2 Scaption  6 lbs x 12 ER 6 lbs x 10-15 (15 on Rt)  Self care: POC   [facility] Adult PT Treatment:                                                DATE: 02/03/22 Therapeutic Exercise: UBE 3/3 mins Level 2 Shoulder mobility: dowel overhead, extension and internal rotation  Abduction x 10 with dowel each side  Row 35 lbs 2 x 15  Chest press 2 sets x 15 45 lbs narrow/chest  Lat pull down  2x 15 , 45 lbs  Prone on ball for scapular stability/upper back:  Extension , T , goal post and overhead  Prone scapular stability 2x10 Retraction added extension  Shoulder mobility, pain with abduction and external rotation  Unable to go from neutral to overhead fully without pain  2 sets of these each Sidelying  scaption 5 lbs  x 15  Sidelying External rotation 5 lbs x 15  Sidelying  reverse fly 3 lbs x 10   [facility] Adult PT Treatment:                                                DATE: 01/28/23 Therapeutic Exercise: UBE in reverse 5 min Level 1 Shoulder mobility: dowel overhead, extension and internal rotation  Abduction x 10 with dowel each side  Row 35 lbs 2 x 15  Chest press 2 sets  x 12 (35 lbs then 45 lbs) narrow/chest  Lat pull down x 12 , 45 lbs  Prone on ball for scapular stability/upper back:  Extension , T , goal post and overhead  2 sets of these Sidelying scaption 3 lbs  x 15 (4 lbs 2nd set)  Sidelying External rotation 4 lbs x 15  Sidelying reverse fly 3 lbs x 12   [facility] Adult PT Treatment:                                                DATE: 01/25/23 Therapeutic Exercise: UBE level 3 2 min each way  Horiz abdct blue 10 x 2  Diagonals 10 x 2 each  Lat pull 10 x 2  ER bilat x 10 blue x 10 ER left only red x 10  Doorway Pec Stretch at 90 Degrees Abduction  Red band ER with wall slides   Protract/retract forearms on wall  Sleeper stretch on wall , left  Free motion 23# row x 10 20# pull down x 10  Plank on forearms  Qped alt UE raise Supine serratus punch  Star gazer stretch  Supine rhythmic stabilization x 30 sec each   Therapeutic Activity: FOTO 66%  [facility] Adult PT Treatment:                                                DATE: 01/22/23 Therapeutic Exercise: UBE reverse 5 min L1 Wall for upper back/posture: blue band pulls (horizontal )  Lat pull down blue band at wall  Bilat ER BTB x 15  Bent over row 15 lbs kneel on mat  x 15 Diagonal pull blue band x 10 each side Narrow looped band overhead lift and chest press x 10 each  Plank on high mat  x 30 sec Forearm plank kneeling x 30 sec + Self Care: Plan of care, gym exercises    PATIENT EDUCATION: Education details: comparison of shoulder symptoms  Person educated: Patient Education method: Explanation, Demonstration, and Handouts Education comprehension: verbalized understanding,  returned demonstration, and needs further education  HOME EXERCISE PROGRAM:  Access Code: [email or web address] URL: [email or web address] Date: 01/22/2023 Prepared by: Delon Norma  Exercises - Sidelying Shoulder Abduction with Resistance to 60 Degrees  - 1-2 x daily - 7 x weekly - 2 sets - 10 reps - 5 hold - Supine Shoulder Horizontal Abduction with Resistance  - 1-2 x daily - 7 x weekly - 2 sets - 10 reps - 5 hold - Shoulder External Rotation and Scapular Retraction with Resistance  -  1-2 x daily - 7 x weekly - 2 sets - 10 reps - 5 hold - Corner Pec Minor Stretch  - 1-2 x daily - 7 x weekly - 1 sets - 3 reps - 30 hold - Doorway Pec Stretch at 90 Degrees Abduction  - 1-2 x daily - 7 x weekly - 1 sets - 3 reps - 30 hold - Shoulder Lat Pull Down with Resistance  - 1 x daily - 7 x weekly - 2 sets - 10 reps - 5 hold - Kneeling Pushup Plus on Elbows  - 1 x daily - 7 x weekly - 1 sets - 3-5 reps - 30 hold  ASSESSMENT:  CLINICAL IMPRESSION: Patient is pleased with his shoulder function. He continue to have some weakness in external rotation/ abduction. He has been using weight machines at the gym without increased pain.  I encouraged him to isolate shoulder into external rotation on a regular basis in addition to his compound lifts. FOTO score unchanged.   OBJECTIVE IMPAIRMENTS: decreased ROM, decreased strength, increased fascial restrictions, impaired sensation, impaired UE functional use, postural dysfunction, and pain.   ACTIVITY LIMITATIONS: carrying, lifting, sitting, and reach over head  PARTICIPATION LIMITATIONS: shopping, community activity, occupation, and yard work   PERSONAL FACTORS: Time since onset of injury/illness/exacerbation and 1-2 comorbidities: Right sided biceps tear, chronic  are also affecting patient's functional outcome.   REHAB POTENTIAL: Excellent  CLINICAL DECISION MAKING: Stable/uncomplicated  EVALUATION COMPLEXITY: Low   GOALS: Goals reviewed with  patient? Yes  SHORT TERM GOALS: Target date: 01/19/2023    Patient will be independent with home exercise program Baseline: Goal status: MET   2.  Patient will sit with corrected posture and make changes to his use of technology to reduce tension on neck Baseline:  01/20/23: Trying to be more mindful  Goal status: MET   3.  Balance screen will be completed if needed  Baseline:  01/20/23: 20-30 sec SLS each, STS 11 sec  Goal status: DEFERRED   LONG TERM GOALS: Target date: 02/16/2023    Patient will be independent with home exercise program and gym exercises upon discharge Baseline:  Goal status: MET  2.  Patient will be able to demonstrate normal active range of motion in bilateral shoulders without increased pain Baseline:  02/04/23: Normal ranges increase in pain, see flow sheets Goal status: MET  3.  Bilateral shoulder strength will be 5/5 in all planes in order to perform lifting and carrying activities Baseline:  4+/5 on L (4+/5 on DC)  Goal status:UNMET  4.  Patient will be able to sleep on his right side without difficulty Baseline:  02/04/23: Better, does so intermittently. 50% in pain overall. Goal status: MET   5.  Foto score will improve to 76% Baseline: 66% 01/25/23: 66% Goal status: NOT MET, stayed the same   PLAN:  PT FREQUENCY: 2x/week  PT DURATION: 8 weeks6-8  PLANNED INTERVENTIONS: 02835- PT Re-evaluation, 97110-Therapeutic exercises, 97530- Therapeutic activity, 97112- Neuromuscular re-education, 97535- Self Care, 02859- Manual therapy, Patient/Family education, Balance training, Taping, Dry Needling, Joint mobilization, Cryotherapy, and Moist heat  PLAN FOR NEXT SESSION:  NA, DCSABRA Delon Norma, PT 02/10/23 12:01 PM Phone: [phone] Fax: [phone]

## 2023-02-15 ENCOUNTER — Ambulatory Visit: Payer: BC Managed Care – PPO | Admitting: Physical Therapy

## 2023-02-16 ENCOUNTER — Other Ambulatory Visit: Payer: Self-pay

## 2023-02-16 ENCOUNTER — Other Ambulatory Visit (INDEPENDENT_AMBULATORY_CARE_PROVIDER_SITE_OTHER): Payer: Self-pay

## 2023-02-16 ENCOUNTER — Encounter: Payer: Self-pay | Admitting: Sports Medicine

## 2023-02-16 ENCOUNTER — Ambulatory Visit: Payer: BC Managed Care – PPO | Admitting: Sports Medicine

## 2023-02-16 DIAGNOSIS — M25512 Pain in left shoulder: Secondary | ICD-10-CM

## 2023-02-16 DIAGNOSIS — S46219A Strain of muscle, fascia and tendon of other parts of biceps, unspecified arm, initial encounter: Secondary | ICD-10-CM

## 2023-02-16 DIAGNOSIS — G8929 Other chronic pain: Secondary | ICD-10-CM

## 2023-02-16 DIAGNOSIS — M25511 Pain in right shoulder: Secondary | ICD-10-CM

## 2023-02-16 DIAGNOSIS — S46212A Strain of muscle, fascia and tendon of other parts of biceps, left arm, initial encounter: Secondary | ICD-10-CM | POA: Diagnosis not present

## 2023-02-16 DIAGNOSIS — M75102 Unspecified rotator cuff tear or rupture of left shoulder, not specified as traumatic: Secondary | ICD-10-CM | POA: Diagnosis not present

## 2023-02-16 MED ORDER — BUPIVACAINE HCL 0.25 % IJ SOLN
2.0000 mL | INTRAMUSCULAR | Status: AC | PRN
  Administered 2023-02-16: 2 mL via INTRA_ARTICULAR

## 2023-02-16 MED ORDER — LIDOCAINE HCL 1 % IJ SOLN
2.0000 mL | INTRAMUSCULAR | Status: AC | PRN
  Administered 2023-02-16: 2 mL

## 2023-02-16 MED ORDER — METHYLPREDNISOLONE ACETATE 40 MG/ML IJ SUSP
40.0000 mg | INTRAMUSCULAR | Status: AC | PRN
  Administered 2023-02-16: 40 mg via INTRA_ARTICULAR

## 2023-02-16 NOTE — Progress Notes (Signed)
 Patient says that he is doing well. He has finished his formal physical therapy appointments and continues to do his exercises on his own. He says he still has a bit of stiffness in the left shoulder, and certain movements still bother the right. Patient has not had to take any pain medications or use ice/heat.

## 2023-02-16 NOTE — Progress Notes (Signed)
 Nathan [name] - 58 y.o. male MRN [id number]  Date of birth: May 08, 1965  Office Visit Note: Visit Date: 02/16/2023 PCP: Burney Darice CROME, MD Referred by: Burney Darice CROME, MD  Subjective: Chief Complaint  Patient presents with   Right Shoulder - Pain, Follow-up   Left Shoulder - Pain, Follow-up   HPI: Nathan [name] is a pleasant 58 y.o. male who presents today for follow-up of bilateral shoulder pain.  Just graduated from PT on 02/10/23 as he made good strides with his therapy.  His left shoulder essentially has no pain, he still has some mild restriction with external rotation and abduction although this has improved from PT and his HEP.  Tells me today that his right shoulder continues to have some pain and stiffness within the shoulder joint.  His pain is anywhere from a 3-4 at its worst a 1-2 when not exacerbated.  He has not been requiring medication because of the pain.  Pertinent ROS were reviewed with the patient and found to be negative unless otherwise specified above in HPI.   Assessment & Plan: Visit Diagnoses:  1. Chronic left shoulder pain   2. Chronic right shoulder pain   3. Biceps tendon tear   4. Nontraumatic tear of supraspinatus tendon, left    Plan: Impression is chronic left shoulder pain with partial tearing of the supraspinatus and rotator cuff arthropathy which has improved from prior subacromial joint injection and physical therapy.  He will continue his home therapy for both shoulders.  His right shoulder has some mild arthritic change but I think more of his pain is emanating from his proximal bicep tendon tear.  Through shared decision making, we did proceed with ultrasound-guided intra-articular glenohumeral joint injection, patient tolerated well.  Advised on 40 hours of modified rest/activity.  He may use ice/heat as well as Tylenol  or Motrin  for any postinjection pain.  After 2 days of rest from the injection, he will continue resuming his formalized  physical therapy for both shoulders.  Would like him to continue this for the next 6 to 8 weeks.  We will follow-up for both shoulders in about 2 months, he may call or return sooner if any issues arise.  Follow-up: Return in about 2 months (around 04/16/2023) for bilateral shoulders.   Meds & Orders: No orders of the defined types were placed in this encounter.   Orders Placed This Encounter  Procedures   Large Joint Inj   XR Shoulder Right   US  Guided Needle Placement - No Linked Charges     Procedures: Large Joint Inj: R glenohumeral on 02/16/2023 1:10 PM Indications: pain Details: 22 G 3.5 in needle, ultrasound-guided posterior approach Medications: 2 mL lidocaine  1 %; 2 mL bupivacaine  0.25 %; 40 mg methylPREDNISolone  acetate 40 MG/ML Outcome: tolerated well, no immediate complications  US -guided glenohumeral joint injection, right shoulder After discussion on risks/benefits/indications, informed verbal consent was obtained. A timeout was then performed. The patient was positioned lying lateral recumbent on examination table. The patient's shoulder was prepped with betadine and multiple alcohol swabs and utilizing ultrasound guidance, the patient's glenohumeral joint was identified on ultrasound. Using ultrasound guidance a 22-gauge, 3.5 inch needle with a mixture of 2:2:1 cc's lidocaine :bupivicaine:depomedrol was directed from a lateral to medial direction via in-plane technique into the glenohumeral joint with visualization of appropriate spread of injectate into the joint. Patient tolerated the procedure well without immediate complications.      Procedure, treatment alternatives, risks and benefits explained, specific risks  discussed. Consent was given by the patient. Immediately prior to procedure a time out was called to verify the correct patient, procedure, equipment, support staff and site/side marked as required. Patient was prepped and draped in the usual sterile fashion.           Clinical History: No specialty comments available.  He reports that he quit smoking about 13 years ago. His smoking use included cigarettes. He has never been exposed to tobacco smoke. He has never used smokeless tobacco. No results for input(s): HGBA1C, LABURIC in the last 8760 hours.  Objective:    Physical Exam  Gen: Well-appearing, in no acute distress; non-toxic CV: Well-perfused. Warm.  Resp: Breathing unlabored on room air; no wheezing. Psych: Fluid speech in conversation; appropriate affect; normal thought process  Ortho Exam - Bilateral shoulder: Left shoulder has no tenderness to palpation.  There is about 5 degrees less of external rotation and 7-8 degrees less of endrange abduction until pain onsets but otherwise has full range of motion.  There is preserved strength of rotator cuff testing.  Right shoulder has tenderness within the bicipital groove and over the anterior shoulder joint.  Positive speeds test,  + mildly Hawkin's impingement testing.  Imaging: XR Shoulder Right Result Date: 02/16/2023 3 views of the right shoulder including AP, Grashey and axial view were ordered and reviewed by myself today.  X-rays show mild glenohumeral and AC joint arthritic change.  The humeral head is well located.  There is a degree of inferior spurring of the tip of the acromion, possibly predisposing to impingement.  No acute fracture or otherwise bony abnormality noted.   Past Medical/Family/Surgical/Social History: Medications & Allergies reviewed per EMR, new medications updated. 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   Ventricular tachycardia, non-sustained (HCC) 10/21/2020   AKI  (acute kidney injury) (HCC) 10/21/2020   Pleural effusion    Acute combined systolic and diastolic heart failure (HCC)    Sarcoidosis    Severe mitral regurgitation    Mediastinal lymphadenopathy 10/12/2020   Dyspnea 10/12/2020   Past Medical History:  Diagnosis Date   Allergies    09/16/2020   BPH (benign prostatic hyperplasia)    ED (erectile dysfunction)    GERD (gastroesophageal reflux disease)    Osteoarthritis    Peripheral focal chorioretinal inflammation of both eyes    Retinal vasculitis of both eyes    Sarcoidosis    Family History  Problem Relation Age of Onset   Stroke Mother    Chronic Renal Failure Mother    Dementia Mother    Cancer Father 30   Diabetes Father    Pancreatic cancer Father    Past Surgical History:  Procedure Laterality Date   BRONCHIAL NEEDLE ASPIRATION BIOPSY  10/18/2020   Procedure: BRONCHIAL NEEDLE ASPIRATION BIOPSIES;  Surgeon: Brenna Adine CROME, DO;  Location: [facility];  Service: Pulmonary;;   BRONCHIAL WASHINGS  10/18/2020   Procedure: BRONCHIAL WASHINGS;  Surgeon: Brenna Adine CROME, DO;  Location: [facility];  Service: Pulmonary;;   ICD IMPLANT N/A 10/28/2020   Procedure: ICD IMPLANT;  Surgeon: Cindie Ole DASEN, MD;  Location: [facility] INVASIVE CV LAB;  Service: Cardiovascular;  Laterality: N/A;   RIGHT/LEFT HEART CATH AND CORONARY ANGIOGRAPHY  N/A 10/16/2020   Procedure: RIGHT/LEFT HEART CATH AND CORONARY ANGIOGRAPHY;  Surgeon: Anner Alm ORN, MD;  Location: [facility] INVASIVE CV LAB;  Service: Cardiovascular;  Laterality: N/A;   VIDEO BRONCHOSCOPY WITH ENDOBRONCHIAL ULTRASOUND Bilateral 10/18/2020   Procedure: VIDEO BRONCHOSCOPY WITH ENDOBRONCHIAL ULTRASOUND;  Surgeon: Brenna Adine CROME, DO;  Location: [facility];  Service: Pulmonary;  Laterality: Bilateral;   Social History   Occupational History   Not on file  Tobacco Use   Smoking status: Former    Current packs/day: 0.00    Types: Cigarettes    Quit date: 02/02/2010    Years since  quitting: 13.0    Passive exposure: Never   Smokeless tobacco: Never  Vaping Use   Vaping status: Never Used  Substance and Sexual Activity   Alcohol use: Yes    Alcohol/week: 1.0 standard drink of alcohol    Types: 1 Cans of beer per week   Drug use: Not Currently   Sexual activity: Not on file

## 2023-02-23 ENCOUNTER — Encounter: Payer: Self-pay | Admitting: Pulmonary Disease

## 2023-02-24 ENCOUNTER — Ambulatory Visit (HOSPITAL_BASED_OUTPATIENT_CLINIC_OR_DEPARTMENT_OTHER): Payer: BC Managed Care – PPO | Admitting: Pulmonary Disease

## 2023-02-24 ENCOUNTER — Encounter (HOSPITAL_BASED_OUTPATIENT_CLINIC_OR_DEPARTMENT_OTHER): Payer: Self-pay | Admitting: Pulmonary Disease

## 2023-02-24 ENCOUNTER — Other Ambulatory Visit: Payer: Self-pay | Admitting: Pharmacist

## 2023-02-24 VITALS — BP 116/74 | HR 47 | Resp 16 | Ht 68.0 in | Wt 219.0 lb

## 2023-02-24 DIAGNOSIS — N1832 Chronic kidney disease, stage 3b: Secondary | ICD-10-CM | POA: Diagnosis not present

## 2023-02-24 DIAGNOSIS — D869 Sarcoidosis, unspecified: Secondary | ICD-10-CM | POA: Diagnosis not present

## 2023-02-24 DIAGNOSIS — D8685 Sarcoid myocarditis: Secondary | ICD-10-CM

## 2023-02-24 MED ORDER — FOLIC ACID 1 MG PO TABS: 2.0000 mg | ORAL_TABLET | Freq: Every day | ORAL | 11 refills | Status: AC

## 2023-02-24 NOTE — Progress Notes (Signed)
Error

## 2023-02-24 NOTE — Patient Instructions (Addendum)
Cardiac sarcoidosis - worsening heart function --ORDER PET/CT Sarcoid --Increase infliximab to 5 mg/kg every 8 weeks --Continue methotrexate 20 mg weekly. REFILL --Plan to restart prednisone taper pending PET/CT results --Plan to enroll in purified cortrophin gel twice weekly pending PET/CT results  Please go to lab before you leave

## 2023-02-24 NOTE — Progress Notes (Signed)
Subjective:   PATIENT ID: Nathan [name] GENDER: male DOB: 1965-12-25, MRN: [id number]  Chief Complaint  Patient presents with   Follow-up    Sarcoid, immunosuppression management, discuss echo    Reason for Visit: Follow-up  Nathan [name] is a 58 year old male former smoker with sarcoid with cardiac/pulmonary/ocular involvement, onchronic immunosuppressants, VT s/p ICD, chronic systolic heart failure, HTN, DM2 and osteoarthritis who presents for follow-up.   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 He was then admitted in 10/2020 for new heart failure and cMRI consistent with cardiac sarcoid. Bronchoscopy for mediastinal adenopathy was not diagnostic of sarcoid but was negative for malignancy. He was started on prednisone and methotrexate. Prednisone was discontinued in 03/2021 and he continued methotrexate 20 mg weekly. PET/CT sarcoid protocol 10/27/21 at [facility] demonstrated 5% cardiac involvement as well as diffuse pulmonary involvement. He was restarted on prednisone until he could be scheduled for pulmonary consultation.  05/07/22 He reports his symptoms are well controlled on prednisone and methotrexate. Though after restarting prednisone he is unsure if it has provided benefit and only taking it due to the abnormal PET/CT. Denies shortness of breath, cough, wheezing, chest pain, fatigue.  05/21/22 Since our last visit continues to have visual blurriness. Has not been contacted by Nephrology. Prefers to go to local Ophthalmologist and requesting referral. Compliant with prednisone and methotrexate. Denies any respiratory complaints  08/19/22 Since our last visit he has been started on infliximab 06/04/22. Has received total of 3 doses. Has tolerated it well with no complaints. Has been on methotrexate 20 mg weekly and prednisone 20 mg daily.  Compliant with bactrim and PPI. Denies shortness of breath, cough, wheezing, chest pain, lower extremity edema.  11/11/22 Since our last visit he is tolerating infliximab and methotrexate. He is down to prednisone 5 mg daily.  02/24/23 Since our last visit he has been weaned off prednisone since October. Has been taking methotrexate 20 mg weekly and infliximab every 2 months. Has been on infliximab for >6 months. Denies shortness of breath, cough, wheezing, lower extremity edema or chest pain.  Social History: Quit smoking 2000s. Social smoker. Quit in 2012 Cowboy boots/Shoe fan  Past Medical History:  Diagnosis Date   Allergies    09/16/2020   BPH (benign prostatic hyperplasia)    ED (erectile dysfunction)    GERD (gastroesophageal reflux disease)    Osteoarthritis    Peripheral focal chorioretinal inflammation of both eyes    Retinal vasculitis of both eyes    Sarcoidosis      Family History  Problem Relation Age of Onset   Stroke Mother    Chronic Renal Failure Mother    Dementia Mother    Cancer Father 95   Diabetes Father    Pancreatic cancer Father      Social History   Occupational History   Not on file  Tobacco Use   Smoking status: Former    Current packs/day: 0.00    Types: Cigarettes    Quit date: 02/02/2010    Years since quitting: 13.0    Passive exposure: Never   Smokeless tobacco: Never  Vaping Use   Vaping status: Never Used  Substance and Sexual Activity   Alcohol use: Yes    Alcohol/week: 1.0 standard drink of alcohol    Types: 1 Cans of beer per week   Drug use: Not  Currently   Sexual activity: Not on file    No Known Allergies   Outpatient Medications Prior to Visit  Medication Sig Dispense Refill   amiodarone (PACERONE) 200 MG tablet Take 1 tablet (200 mg total) by mouth daily at 12 noon. Monday-Friday only 30 tablet 6   Blood Pressure Monitoring (OMRON 3 SERIES BP MONITOR) DEVI Use as directed 1 each 0   carvedilol (COREG) 3.125 MG  tablet Take 1 tablet (3.125 mg total) by mouth 2 (two) times daily. 60 tablet 11   cetirizine (ZYRTEC) 10 MG tablet Take 10 mg by mouth as needed.     dapagliflozin propanediol (FARXIGA) 10 MG TABS tablet Take 1 tablet (10 mg total) by mouth daily. 90 tablet 3   diclofenac Sodium (VOLTAREN) 1 % GEL Apply 2 g topically 4 (four) times daily as needed. 200 g 1   furosemide (LASIX) 20 MG tablet Take 1 tablet (20 mg total) by mouth daily as needed. 30 tablet 3   inFLIXimab-axxq (AVSOLA IV) Inject into the vein. 3mg /kg at Week 0, 2, and 6, then 3mg /kg every 8 weeks thereafter     losartan (COZAAR) 25 MG tablet Take 1/2 tablet (12.5 mg total) by mouth daily. NEEDS FOLLOW UP APPOINTMENT FOR MORE REFILLS 45 tablet 0   Magnesium Oxide 250 MG TABS take 1 tablet by mouth every day as needed 90 tablet 0   metFORMIN (GLUCOPHAGE-XR) 500 MG 24 hr tablet Take 1 tablet (500 mg total) by mouth daily with evening meal 90 tablet 0   methotrexate (RHEUMATREX) 2.5 MG tablet Take 8 tablets (20 mg total) by mouth as directed every Wednesday. Caution: Chemotherapy, protect from light. 96 tablet 1   Multiple Vitamin (MULTIVITAMIN ADULT PO) Take by mouth.     omeprazole (PRILOSEC) 40 MG capsule Take 1 capsule (40 mg total) by mouth every morning. 90 capsule 3   sildenafil (REVATIO) 20 MG tablet Take 2-3 tablets (40-60 mg total) by mouth as needed. 50 tablet 0   spironolactone (ALDACTONE) 25 MG tablet Take 1 tablet (25 mg total) by mouth daily. 90 tablet 3   tamsulosin (FLOMAX) 0.4 MG CAPS capsule Take 1 capsule (0.4 mg total) by mouth at bedtime. 90 capsule 0   tamsulosin (FLOMAX) 0.4 MG CAPS capsule Take 1 capsule (0.4 mg total) by mouth at bedtime. 90 capsule 0   folic acid (FOLVITE) 1 MG tablet Take by mouth.     No facility-administered medications prior to visit.    Review of Systems  Constitutional:  Negative for chills, diaphoresis, fever, malaise/fatigue and weight loss.  HENT:  Negative for congestion.    Respiratory:  Negative for cough, hemoptysis, sputum production, shortness of breath and wheezing.   Cardiovascular:  Negative for chest pain, palpitations and leg swelling.     Objective:   Vitals:   02/24/23 0830  BP: 116/74  Pulse: (!) 47  Resp: 16  SpO2: 99%  Weight: 219 lb (99.3 kg)  Height: 5\' 8"  (1.727 m)    SpO2: 99 %  Physical Exam: General: Well-appearing, no acute distress HENT: [place], AT Eyes: EOMI, no scleral icterus Respiratory: Clear to auscultation bilaterally.  No crackles, wheezing or rales Cardiovascular: RRR, -M/R/G, no JVD Extremities:-Edema,-tenderness Neuro: AAO x4, CNII-XII grossly intact Psych: Normal mood, normal affect   Data Reviewed:  Imaging:  Cardiac PET/CT 10/27/21 metabolic study with F-18 FDG and scanned on PET [facility] MI:   1. There are few segments with hypermetabolic activity suggestive of an active inflammatory process in  the left ventricular       myocardium.   2. Approximately 5% of the left ventricle is involved with predominantly mild/moderate hypermetabolic activity.   3. There is no evidence of abnormal metabolism in the right ventricle.   4. In the appropriate clinical context with positive biomarkers, these findings may represent active cardiac sarcoidosis.    Perfusion/Function.    Gated cardiac PET/CT rest myocardial perfusion study with [id number] demonstrates:   1. Abnormal myocardial perfusion in a non-CAD pattern.   2. Severe dilated left ventricle with severe left ventricular systolic dysfunction.    Non-diagnostic limited  CT obtained for PET attenuation correction:   1. There are no discernible coronary artery calcifications.   2. There are extensive areas of extracardiac hypermetabolic activity noted on the limited field of view, specifically "bulky"        bilateral hilar and mediastinal lymph nodes. If clinically indicated/concern for systemic disease, a whole body   PFT: 05/12/22 FVC 4.09 (83%) FEV1 3.50 (92%)  Ratio 86  TLC 87% DLCO 77% Interpretation: Normal PFTs  Labs:    Latest Ref Rng & Units 11/09/2022   11:03 AM 07/20/2022   10:56 AM 06/22/2022   10:14 AM  CBC  WBC 4.0 - 10.5 K/uL 6.5  10.4  7.2   Hemoglobin 13.0 - 17.0 g/dL 86.5  78.4  69.6   Hematocrit 39.0 - 52.0 % 42.5  39.4  41.8   Platelets 150.0 - 400.0 K/uL 198.0  151.0  179.0       Latest Ref Rng & Units 11/09/2022   11:03 AM 09/03/2022    8:20 AM 07/20/2022   10:56 AM  BMP  Glucose 70 - 99 mg/dL 86  75  295   BUN 6 - 23 mg/dL 15  23  23    Creatinine 0.40 - 1.50 mg/dL 2.84  1.32  4.40   BUN/Creat Ratio 9 - 20  15    Sodium 135 - 145 mEq/L 140  137  140   Potassium 3.5 - 5.1 mEq/L 3.9  4.0  3.9   Chloride 96 - 112 mEq/L 108  103  110   CO2 19 - 32 mEq/L 25  22  22    Calcium 8.4 - 10.5 mg/dL 9.3  9.1  8.7       Latest Ref Rng & Units 11/09/2022   11:03 AM 09/03/2022    8:20 AM 07/20/2022   10:56 AM  Hepatic Function  Total Protein 6.0 - 8.3 g/dL 6.6  5.9  6.3   Albumin 3.5 - 5.2 g/dL 4.2  3.9  3.7   AST 0 - 37 U/L 19  15  18    ALT 0 - 53 U/L 13  14  15    Alk Phosphatase 39 - 117 U/L 44  37  32   Total Bilirubin 0.2 - 1.2 mg/dL 0.5  0.5  0.3    Bronchoscopy 10/18/20 [facility] - no malignancy     Assessment & Plan:   Discussion:  58 year old male former smoker with sarcoid with cardiac/pulmonary/ocular involvement, onchronic immunosuppressants, VT s/p ICD, chronic systolic heart failure, HTN and DM2 who presents for follow-up. On infliximab 3 mg/kg q 2 months and methotrexate 20 mg weekly. Off prednisone since 11/2022. Reviewed 01/2023 echocardiogram with worsening EF. Compensated with no symptoms in clinic. Will evaluate with PET sarcoid to determine if related to sarcoid vs cardiac etiology. Will increase infliximab dosing in the interim and pending PET results, will restart PO  prednisone with plan to transition to steroid injections twice weekly. Will contact his cardiologist Dr. Gala Romney regarding patient's  condition.  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 Worsening in EF despite two immunosuppressants  Cardiac sarcoid with NSVT Chronic systolic heart failure EF 30-35% Mitral regurgitation --Followed by Cardiology. Referred 05/2022 for multi-organ involvement --MR Cardiac 10/17/20 - Severe LV dysfunction with LGE pattern, bilateral pleural effusions --S/p ICD 10/28/20 --Echo 02/07/21 EF 30-35% --Echo 02/02/23 <20% after 6 months of infliximab --ORDER PET/CT Sarcoid --Will restart prednisone with plan to transition to twice weekly purified cortrophin gel --Will contact Heart failure team to consider non-sarcoid related heart failure  Peripheral focal chorioretinal inflammation of both eyes --Diagnosed in 2020 at [facility] --Last seen 02/07/19 with Dr. Sherryll Burger --Last optho visit 10/26/22 Ophthalmologist in the [place] area - no active sarcoid per patient  History of immunosuppression High risk medication management --Methotrexate 02/2018-2021 for ocular sarcoid --Methotrexate Started 10/2020 > on 20 mg weekly dosing since Spring 2023> currently on --Prednisone 20 mg daily 04/28/22>08/19/22 --Off prednisone by end of 11/2022 --Infliximab started 3 mg/kg on 06/04/22 --Infliximab increased to 5 mg/kg on 03/15/23 --Increase infliximab to 5 mg/kg every 8 weeks. Next dose 03/15/23 --Continue methotrexate 20 mg weekly with folic acid 2 mg daily. REFILL --Plan to restart prednisone taper pending PET/CT results --Plan to enroll in purified cortrophin gel twice weekly pending PET/CT results  Sarcoid Monitoring --Recent chest imaging reviewed.  --Annual PFTs. Normal on 05/21/22. --Annual ophthalmology exam.  Last visit on  10/26/22  --Recent EKG and TTE reviewed as above --Routine labs as  needed: CBC with diff, CMET, 1, 25 and 25 hydroxy vitamin D, urinary calcium  CKD IIIB --Established with Nephrology with Dr. Malen Gauze  Health Maintenance Immunization History  Administered Date(s) Administered   Influenza, Seasonal, Injecte, Preservative Fre 11/11/2022   Influenza,inj,Quad PF,6+ Mos 10/15/2020   PFIZER(Purple Top)SARS-COV-2 Vaccination 07/06/2019, 07/27/2019, 02/15/2020   Pfizer Covid-19 Vaccine Bivalent Booster 60yrs & up 10/21/2020   CT Lung Screen - not qualified. Insufficient tobacco history  Orders Placed This Encounter  Procedures   NM PET CT MYOCARDIAL SARCOIDOSIS    Standing Status:   Future    Expiration Date:   02/24/2024    Scheduling Instructions:     Schedule as soon as possible once insurance approves    If indicated for the ordered procedure, I authorize the administration of a radiopharmaceutical per Radiology protocol:   Yes    Preferred Imaging Location:   [facility]   CBC with Differential   Comp Met (CMET)   Meds ordered this encounter  Medications   folic acid (FOLVITE) 1 MG tablet    Sig: Take 2 tablets (2 mg total) by mouth daily.    Dispense:  60 tablet    Refill:  11    Return for April .  I have spent a total time of 50-minutes on the day of the appointment including chart review, data review, collecting history, coordinating care and discussing medical diagnosis and plan with the patient/family. Past medical history, allergies, medications were reviewed. Pertinent imaging, labs and tests included in this note have been reviewed and interpreted independently by me.   Clark Clowdus Mechele Collin, MD  Pulmonary Critical Care 02/24/2023 9:42 AM  Office Number [phone]

## 2023-02-24 NOTE — Progress Notes (Signed)
Dr. Everardo All sent request via secure [facility] chat. Requesting increase of infliximab infusion dose to 5mg /kg every 8 weeks  Infusion therapy plan addended to reflect AVSOLA 5mg /kg every 8 weeks  Chesley Mires, PharmD, MPH, BCPS, CPP Clinical Pharmacist (Rheumatology and Pulmonology)

## 2023-02-25 ENCOUNTER — Encounter (HOSPITAL_BASED_OUTPATIENT_CLINIC_OR_DEPARTMENT_OTHER): Payer: Self-pay | Admitting: Pulmonary Disease

## 2023-02-25 LAB — COMPREHENSIVE METABOLIC PANEL
ALT: 24 [IU]/L (ref 0–44)
AST: 21 [IU]/L (ref 0–40)
Albumin: 4.2 g/dL (ref 3.8–4.9)
Alkaline Phosphatase: 67 [IU]/L (ref 44–121)
BUN/Creatinine Ratio: 6 — ABNORMAL LOW (ref 9–20)
BUN: 9 mg/dL (ref 6–24)
Bilirubin Total: 0.4 mg/dL (ref 0.0–1.2)
CO2: 24 mmol/L (ref 20–29)
Calcium: 9.5 mg/dL (ref 8.7–10.2)
Chloride: 105 mmol/L (ref 96–106)
Creatinine, Ser: 1.48 mg/dL — ABNORMAL HIGH (ref 0.76–1.27)
Globulin, Total: 2.3 g/dL (ref 1.5–4.5)
Glucose: 92 mg/dL (ref 70–99)
Potassium: 4.8 mmol/L (ref 3.5–5.2)
Sodium: 142 mmol/L (ref 134–144)
Total Protein: 6.5 g/dL (ref 6.0–8.5)
eGFR: 56 mL/min/{1.73_m2} — ABNORMAL LOW (ref >59)

## 2023-02-25 LAB — CBC WITH DIFFERENTIAL/PLATELET
Basophils Absolute: 0 10*3/uL (ref 0.0–0.2)
Basos: 1 %
EOS (ABSOLUTE): 0.1 10*3/uL (ref 0.0–0.4)
Eos: 3 %
Hematocrit: 39.9 % (ref 37.5–51.0)
Hemoglobin: 12.3 g/dL — ABNORMAL LOW (ref 13.0–17.7)
Immature Grans (Abs): 0 10*3/uL (ref 0.0–0.1)
Immature Granulocytes: 1 %
Lymphocytes Absolute: 1.3 10*3/uL (ref 0.7–3.1)
Lymphs: 29 %
MCH: 29.6 pg (ref 26.6–33.0)
MCHC: 30.8 g/dL — ABNORMAL LOW (ref 31.5–35.7)
MCV: 96 fL (ref 79–97)
Monocytes Absolute: 0.9 10*3/uL (ref 0.1–0.9)
Monocytes: 20 %
Neutrophils Absolute: 2.1 10*3/uL (ref 1.4–7.0)
Neutrophils: 46 %
Platelets: 255 10*3/uL (ref 150–450)
RBC: 4.16 x10E6/uL (ref 4.14–5.80)
RDW: 16.7 % — ABNORMAL HIGH (ref 11.6–15.4)
WBC: 4.5 10*3/uL (ref 3.4–10.8)

## 2023-03-05 ENCOUNTER — Encounter (HOSPITAL_COMMUNITY): Payer: Self-pay

## 2023-03-09 ENCOUNTER — Telehealth (HOSPITAL_COMMUNITY): Payer: Self-pay | Admitting: *Deleted

## 2023-03-09 NOTE — Telephone Encounter (Signed)
Reaching out to patient to offer assistance regarding upcoming cardiac imaging study; pt verbalizes understanding of appt date/time, parking situation and where to check in, pre-test NPO status and verified current allergies; name and call back number provided for further questions should they arise  Merle Prescott RN Navigator Cardiac Imaging [facility] [phone] office [phone] cell  Patient verbalized understanding of diet prep.   

## 2023-03-11 ENCOUNTER — Encounter (HOSPITAL_COMMUNITY)
Admission: RE | Admit: 2023-03-11 | Discharge: 2023-03-11 | Disposition: A | Discharge status: Home / Self Care | Payer: Medicaid Other | Source: Ambulatory Visit | Attending: Pulmonary Disease | Admitting: Pulmonary Disease

## 2023-03-11 ENCOUNTER — Encounter: Payer: Self-pay | Admitting: Pulmonary Disease

## 2023-03-11 ENCOUNTER — Telehealth: Payer: Self-pay | Admitting: Pulmonary Disease

## 2023-03-11 DIAGNOSIS — D8685 Sarcoid myocarditis: Secondary | ICD-10-CM | POA: Diagnosis present

## 2023-03-11 LAB — NM PET CT MYOCARDIAL SARCOIDOSIS
Nuc Stress EF: 19 %
Rest Nuclear Isotope Dose: 25.4 mCi

## 2023-03-11 MED ORDER — FLUDEOXYGLUCOSE F - 18 (FDG) INJECTION
8.8100 | Freq: Once | INTRAVENOUS | Status: AC
  Administered 2023-03-11: 8.81 via INTRAVENOUS

## 2023-03-11 MED ORDER — RUBIDIUM RB82 GENERATOR (RUBYFILL)
25.4200 | PACK | Freq: Once | INTRAVENOUS | Status: AC
  Administered 2023-03-11: 25.42 via INTRAVENOUS

## 2023-03-11 NOTE — Telephone Encounter (Signed)
 1. 13 x 9 mm subpleural nodule paraspinal right lower lobe. This region of the lung was obscured by atelectasis and effusion on the prior imaging study. Consider one of the following in 3 months for both low-risk and high-risk individuals: (a) repeat chest CT, (b) follow-up PET-CT, or (c) tissue sampling. This recommendation follows the consensus statement: Guidelines for Management of Incidental Pulmonary Nodules Detected on CT Images: From the Fleischner Society 2017; Radiology 2017; 284:228-243. 2. Mediastinal and hilar lymphadenopathy, improved from the prior study. Etiology of the mediastinal and hilar lymphadenopathy is indeterminate on today's study but would be compatible with the reported clinical history of sarcoidosis.

## 2023-03-11 NOTE — Telephone Encounter (Signed)
 [facility] PET Study from 03/11/23

## 2023-03-15 ENCOUNTER — Telehealth: Payer: Self-pay

## 2023-03-15 ENCOUNTER — Ambulatory Visit: Payer: Medicaid Other

## 2023-03-15 VITALS — BP 109/70 | HR 64 | Temp 98.3°F | Resp 18 | Ht 68.0 in | Wt 214.4 lb

## 2023-03-15 DIAGNOSIS — D869 Sarcoidosis, unspecified: Secondary | ICD-10-CM

## 2023-03-15 DIAGNOSIS — Z79899 Other long term (current) drug therapy: Secondary | ICD-10-CM

## 2023-03-15 DIAGNOSIS — D8685 Sarcoid myocarditis: Secondary | ICD-10-CM

## 2023-03-15 MED ORDER — ACETAMINOPHEN 325 MG PO TABS
650.0000 mg | ORAL_TABLET | Freq: Once | ORAL | Status: AC
  Administered 2023-03-15: 650 mg via ORAL
  Filled 2023-03-15: qty 2

## 2023-03-15 MED ORDER — SODIUM CHLORIDE 0.9 % IV SOLN
5.0000 mg/kg | Freq: Once | INTRAVENOUS | Status: AC
  Administered 2023-03-15: 500 mg via INTRAVENOUS
  Filled 2023-03-15: qty 50

## 2023-03-15 MED ORDER — METHYLPREDNISOLONE SODIUM SUCC 40 MG IJ SOLR
40.0000 mg | Freq: Once | INTRAMUSCULAR | Status: AC
  Administered 2023-03-15: 40 mg via INTRAVENOUS

## 2023-03-15 MED ORDER — DIPHENHYDRAMINE HCL 25 MG PO CAPS
25.0000 mg | ORAL_CAPSULE | Freq: Once | ORAL | Status: AC
  Administered 2023-03-15: 25 mg via ORAL
  Filled 2023-03-15: qty 1

## 2023-03-15 NOTE — Telephone Encounter (Signed)
 Avsola  dose changed and insurance changed to [facility].  Auth Submission: NO AUTH NEEDED Site of care: Site of care: [facility] Payer: [employer] medicaid Medication & CPT/J Code(s) submitted: Avsola  (infliximab -axxq) [id number] Route of submission (phone, fax, portal): phone Phone # [phone] Fax # Auth type: Buy/Bill PB Units/visits requested: 5mg /kg every 8 weeks Reference number: [id number] Approval from: 03/15/23 to 02/02/24

## 2023-03-15 NOTE — Progress Notes (Signed)
 Diagnosis: Sarcoidosis  Provider:  Phyllis Breeze MD  Procedure: IV Infusion  IV Type: Peripheral, IV Location: R Antecubital  Avsola  (infliximab -axxq), Dose: 500 mg  Infusion Start Time: 1035  Infusion Stop Time: 1257  Post Infusion IV Care: Peripheral IV Discontinued  Discharge: Condition: Good, Destination: Home . AVS Declined  Performed by:  Nyeemah Jennette, RN

## 2023-03-16 NOTE — Telephone Encounter (Signed)
Aware of results. Messaged Cardiology team regarding sarcoid PET portion. Once plan discussed will call patient regarding sarcoid as well as pulmonary nodule with repeat imaging recommendation

## 2023-03-17 ENCOUNTER — Other Ambulatory Visit (HOSPITAL_COMMUNITY): Payer: Self-pay

## 2023-03-31 ENCOUNTER — Other Ambulatory Visit (HOSPITAL_COMMUNITY): Payer: Self-pay | Admitting: Pulmonary Disease

## 2023-03-31 ENCOUNTER — Encounter (HOSPITAL_BASED_OUTPATIENT_CLINIC_OR_DEPARTMENT_OTHER): Payer: Self-pay | Admitting: Pulmonary Disease

## 2023-03-31 DIAGNOSIS — R911 Solitary pulmonary nodule: Secondary | ICD-10-CM

## 2023-03-31 NOTE — Progress Notes (Signed)
 [facility] Telephone Encounter:  As we discussed on the phone, your sarcoid PET scan is not consistent with active sarcoid. I have discussed this with your cardiologist Dr. Cherrie who will arrange follow-up to manage your heart failure.  Regarding your lung nodule seen on your heart PET scan, will obtain a PET/CT to determine activity of this lung nodule and decide if biopsy or further scans needed. We will obtain before your next visit in April and discuss plan at that time.

## 2023-04-07 ENCOUNTER — Encounter: Payer: Self-pay | Admitting: Pulmonary Disease

## 2023-04-11 ENCOUNTER — Other Ambulatory Visit (HOSPITAL_COMMUNITY): Payer: Self-pay | Admitting: Family Medicine

## 2023-04-12 ENCOUNTER — Encounter: Payer: Self-pay | Admitting: Pulmonary Disease

## 2023-04-12 ENCOUNTER — Other Ambulatory Visit (HOSPITAL_COMMUNITY): Payer: Self-pay

## 2023-04-13 ENCOUNTER — Other Ambulatory Visit (HOSPITAL_COMMUNITY): Payer: Self-pay

## 2023-04-13 ENCOUNTER — Encounter: Payer: Self-pay | Admitting: Pulmonary Disease

## 2023-04-13 ENCOUNTER — Other Ambulatory Visit: Payer: Self-pay

## 2023-04-13 MED ORDER — LOSARTAN POTASSIUM 25 MG PO TABS
12.5000 mg | ORAL_TABLET | Freq: Every day | ORAL | 0 refills | Status: DC
  Filled 2023-04-13 – 2023-04-23 (×2): qty 45, 90d supply, fill #0

## 2023-04-14 ENCOUNTER — Ambulatory Visit: Payer: BC Managed Care – PPO | Admitting: Sports Medicine

## 2023-04-16 ENCOUNTER — Ambulatory Visit: Payer: BC Managed Care – PPO | Admitting: Sports Medicine

## 2023-04-16 NOTE — Addendum Note (Signed)
 Encounter addended by: Howell Rucks, RDCS on: 04/16/2023 2:20 PM  Actions taken: Imaging Exam ended

## 2023-04-22 ENCOUNTER — Other Ambulatory Visit (HOSPITAL_COMMUNITY): Payer: Self-pay

## 2023-04-23 ENCOUNTER — Other Ambulatory Visit: Payer: Self-pay

## 2023-04-23 ENCOUNTER — Other Ambulatory Visit (HOSPITAL_COMMUNITY): Payer: Self-pay | Admitting: Family Medicine

## 2023-04-23 ENCOUNTER — Other Ambulatory Visit (HOSPITAL_COMMUNITY): Payer: Self-pay

## 2023-04-23 MED ORDER — AMIODARONE HCL 200 MG PO TABS
200.0000 mg | ORAL_TABLET | Freq: Every day | ORAL | 6 refills | Status: AC
  Filled 2023-04-23: qty 30, 30d supply, fill #0
  Filled 2023-06-01 – 2023-07-06 (×3): qty 30, 30d supply, fill #1
  Filled 2023-09-06: qty 30, 30d supply, fill #2
  Filled 2023-11-15: qty 30, 30d supply, fill #3
  Filled 2024-03-02: qty 30, 30d supply, fill #4

## 2023-04-27 ENCOUNTER — Ambulatory Visit (INDEPENDENT_AMBULATORY_CARE_PROVIDER_SITE_OTHER): Payer: 59

## 2023-04-27 DIAGNOSIS — I4729 Other ventricular tachycardia: Secondary | ICD-10-CM | POA: Diagnosis not present

## 2023-04-27 LAB — CUP PACEART REMOTE DEVICE CHECK
Date Time Interrogation Session: 20250325085550
Implantable Lead Connection Status: 753985
Implantable Lead Implant Date: 20220926
Implantable Lead Location: 753860
Implantable Lead Model: 436910
Implantable Lead Serial Number: 81470915
Implantable Pulse Generator Implant Date: 20220926
Pulse Gen Model: 429525
Pulse Gen Serial Number: 84847461

## 2023-04-28 DIAGNOSIS — K429 Umbilical hernia without obstruction or gangrene: Secondary | ICD-10-CM | POA: Diagnosis not present

## 2023-04-29 ENCOUNTER — Encounter: Payer: Self-pay | Admitting: Pulmonary Disease

## 2023-05-01 ENCOUNTER — Encounter: Payer: Self-pay | Admitting: Cardiology

## 2023-05-03 NOTE — Progress Notes (Deleted)
 Office Visit Note  Patient: Nathan [name]             Date of Birth: 1965-11-08           MRN: [id number]             PCP: Dois Davenport, MD Referring: Dois Davenport, MD Visit Date: 05/17/2023   Subjective:  No chief complaint on file.   History of Present Illness: Nathan [name] is a 58 y.o. male here for follow up for sarcoidosis with cardiac, pulmonary, retinal, and bulky lymph node inflammation on methotrexate 20 mg p.o. weekly and Avsola IV every 8 weeks.    Previous HPI 11/16/2022 Nathan [name] is a 58 y.o. male here for follow up for sarcoidosis with cardiac, pulmonary, retinal, and bulky lymph node inflammation on methotrexate 20 mg p.o. weekly and Avsola IV every 8 weeks.  Since the last visit he has proceeded tapering off the remaining prednisone completely.  Has not noticed any particular exacerbation of symptoms still has stiffness in multiple areas.  Has not noticed any new joint swelling rashes or other obvious symptoms.  Biggest joint complaint today is with the shoulders. Has some pain and stiffness but particularly right shoulder suffered injury about 8 months ago lifting a music case felt a popping sensation with subsequent lump or swelling just proximal to the elbow.  This has not been particularly painful or limiting his strength or activity.  He never saw any specialist for evaluation of the injury.   Previous HPI 08/14/22 Nathan [name] is a 58 y.o. male here for evaluation of sarcoidosis also with complaint of bilateral but mostly right-sided hand pain.  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  He was started on treatment with methotrexate and steroid eyedrops at that time.  Had some additional autoimmune workup that was negative for evidence of rheumatoid arthritis or systemic inflammation at the time. Was on maintenance treatment until he stopped  due to losing his job in 2021. Continued off any specific maintenance treatment without severe exacerbation of symptoms until in 2022.  Where he started developing initially some mild cough and orthopnea but then progressed to more severe lower extremity swelling and dyspnea on exertion.  Outpatient clinic exam with bulky lymphadenopathy large concerning pulmonary nodules.  Was hospitalized in September 2022 had bronchoscopy with biopsies negative for malignancy.  Cardiac workup at the time with ejection fraction of 25% and cardiac MRI consistent with 18% and a late gadolinium enhancement pattern consistent with cardiac sarcoidosis.  He was started on prednisone and methotrexate and discharged with LifeVest due to frequent PVCs.  He was readmitted later that month after multiple alarms and had ICD placement. He tapered off the prednisone on just methotrexate maintenance at 20 mg p.o. weekly.  Subsequent improvement in left ventricular ejection fraction up to 30 to 35%.  Went for PET scan in September last year this demonstrated ongoing extracardiac sarcoidosis activity with some cardiac disease involving only 5% of the left ventricular myocardium.  Did have return of significant symptoms positional and with exertion combined with his findings was restarted on prednisone since March.  Was also recommended to follow-up again with Dr. Dierdre Forth his previous rheumatologist unable to do so at the time due to outstanding office bill.  Currently maintained on prednisone 20 mg daily.  Also saw Dr. Everardo All and started on Avsola infusion completed initial 3  loading doses so far. He experiences joint pain in both hands more severe on the right particularly around the MCP joints.  He has noticed a little bit in the past but more symptomatic for the past 3 months.  Had x-rays checked from his primary care office consistent with osteoarthritis.  Never had any specific injuries or surgery to the affected area.  He thinks there is  some swelling as the knuckles look bigger than usual and often feels stiff and tight.  Has not noticed a very big difference since resuming the prednisone.  Takes Tylenol and uses topical Voltaren intermittently these are partially helpful. He does not complain of any skin rashes.   Imaging reviewed 07/29/22 Xray right hand 3 views IMPRESSION: 1. Mild-to-moderate third metacarpophalangeal joint osteoarthritis. 2. Mild-to-moderate thumb interphalangeal and second and third DIP osteoarthritis. 3. Moderate thumb carpometacarpal osteoarthritis.   10/27/21 PET/CT FINAL COMMENTS  Metabolism.   Cardiac PET/CT metabolic study with F-18 FDG and scanned on PET Discovery MI:   1. There are few segments with hypermetabolic activity suggestive of an active inflammatory process in the left ventricular myocardium.   2. Approximately 5% of the left ventricle is involved with predominantly mild/moderate hypermetabolic activity.   3. There is no evidence of abnormal metabolism in the right ventricle.   4. In the appropriate clinical context with positive biomarkers, these findings may represent active cardiac sarcoidosis.   Perfusion/Function.   Gated cardiac PET/CT rest myocardial perfusion study with [id number] demonstrates:   1. Abnormal myocardial perfusion in a non-CAD pattern.   2. Severe dilated left ventricle with severe left ventricular systolic dysfunction.   Non-diagnostic limited  CT obtained for PET attenuation correction:   1. There are no discernible coronary artery calcifications.   2. There are extensive areas of extracardiac hypermetabolic activity noted on the limited field of view, specifically "bulky" bilateral hilar and mediastinal lymph nodes. If clinically indicated/concern for systemic disease, a whole body PET/CT may be obtained for further evaluation.    No Rheumatology ROS completed.   PMFS History:  Patient Active Problem List   Diagnosis Date Noted   Biceps tendon tear  11/16/2022   Vitamin D deficiency 08/14/2022   High risk medication use 05/25/2022   Chronic systolic heart failure (HCC) 11/04/2020   ICD (implantable cardioverter-defibrillator) in place    Preoperative examination    Cardiac sarcoidosis 10/21/2020   Ventricular tachycardia, non-sustained (HCC) 10/21/2020   AKI (acute kidney injury) (HCC) 10/21/2020   Pleural effusion    Acute combined systolic and diastolic heart failure (HCC)    Sarcoidosis    Severe mitral regurgitation    Mediastinal lymphadenopathy 10/12/2020   Dyspnea 10/12/2020    Past Medical History:  Diagnosis Date   Allergies    09/16/2020   BPH (benign prostatic hyperplasia)    ED (erectile dysfunction)    GERD (gastroesophageal reflux disease)    Osteoarthritis    Peripheral focal chorioretinal inflammation of both eyes    Retinal vasculitis of both eyes    Sarcoidosis     Family History  Problem Relation Age of Onset   Stroke Mother    Chronic Renal Failure Mother    Dementia Mother    Cancer Father 58   Diabetes Father    Pancreatic cancer Father    Past Surgical History:  Procedure Laterality Date   BRONCHIAL NEEDLE ASPIRATION BIOPSY  10/18/2020   Procedure: BRONCHIAL NEEDLE ASPIRATION BIOPSIES;  Surgeon: Josephine Igo, DO;  Location: [facility];  Service:  Pulmonary;;   BRONCHIAL WASHINGS  10/18/2020   Procedure: BRONCHIAL WASHINGS;  Surgeon: Josephine Igo, DO;  Location: [facility];  Service: Pulmonary;;   ICD IMPLANT N/A 10/28/2020   Procedure: ICD IMPLANT;  Surgeon: Lanier Prude, MD;  Location: [facility] INVASIVE CV LAB;  Service: Cardiovascular;  Laterality: N/A;   RIGHT/LEFT HEART CATH AND CORONARY ANGIOGRAPHY N/A 10/16/2020   Procedure: RIGHT/LEFT HEART CATH AND CORONARY ANGIOGRAPHY;  Surgeon: Marykay Lex, MD;  Location: [facility] INVASIVE CV LAB;  Service: Cardiovascular;  Laterality: N/A;   VIDEO BRONCHOSCOPY WITH ENDOBRONCHIAL ULTRASOUND Bilateral 10/18/2020   Procedure: VIDEO BRONCHOSCOPY  WITH ENDOBRONCHIAL ULTRASOUND;  Surgeon: Josephine Igo, DO;  Location: [facility];  Service: Pulmonary;  Laterality: Bilateral;   Social History   Social History Narrative   Not on file   Immunization History  Administered Date(s) Administered   Influenza, Seasonal, Injecte, Preservative Fre 11/11/2022   Influenza,inj,Quad PF,6+ Mos 10/15/2020   PFIZER(Purple Top)SARS-COV-2 Vaccination 07/06/2019, 07/27/2019, 02/15/2020   Pfizer Covid-19 Vaccine Bivalent Booster 100yrs & up 10/21/2020     Objective: Vital Signs: There were no vitals taken for this visit.   Physical Exam   Musculoskeletal Exam: ***  CDAI Exam: CDAI Score: -- Patient Global: --; Provider Global: -- Swollen: --; Tender: -- Joint Exam 05/17/2023   No joint exam has been documented for this visit   There is currently no information documented on the homunculus. Go to the Rheumatology activity and complete the homunculus joint exam.  Investigation: No additional findings.  Imaging: CUP PACEART REMOTE DEVICE CHECK Result Date: 04/27/2023 Scheduled remote reviewed. Normal device function.  Presenting rhythm: AS/VS with PVC. Next remote 91 days. [facility], [facility]   Recent Labs: Lab Results  Component Value Date   WBC 4.5 02/24/2023   HGB 12.3 (L) 02/24/2023   PLT 255 02/24/2023   NA 142 02/24/2023   K 4.8 02/24/2023   CL 105 02/24/2023   CO2 24 02/24/2023   GLUCOSE 92 02/24/2023   BUN 9 02/24/2023   CREATININE 1.48 (H) 02/24/2023   BILITOT 0.4 02/24/2023   ALKPHOS 67 02/24/2023   AST 21 02/24/2023   ALT 24 02/24/2023   PROT 6.5 02/24/2023   ALBUMIN 4.2 02/24/2023   CALCIUM 9.5 02/24/2023   GFRAA >60 12/05/2015   QFTBGOLDPLUS Negative 05/19/2022    Speciality Comments: Avsola (infliximab) started 06/04/2022 - at [facility]  Procedures:  No procedures performed Allergies: Patient has no known allergies.   Assessment / Plan:     Visit Diagnoses: No diagnosis  found.  ***  Orders: No orders of the defined types were placed in this encounter.  No orders of the defined types were placed in this encounter.    Follow-Up Instructions: No follow-ups on file.   Metta Clines, RT  Note - This record has been created using [employer].  Chart creation errors have been sought, but may not always  have been located. Such creation errors do not reflect on  the standard of medical care.

## 2023-05-06 ENCOUNTER — Ambulatory Visit (HOSPITAL_COMMUNITY)
Admission: RE | Admit: 2023-05-06 | Discharge: 2023-05-06 | Disposition: A | Discharge status: Home / Self Care | Source: Ambulatory Visit | Attending: Internal Medicine | Admitting: Internal Medicine

## 2023-05-06 VITALS — BP 110/72 | HR 75 | Ht 68.0 in | Wt 214.4 lb

## 2023-05-06 DIAGNOSIS — N1831 Chronic kidney disease, stage 3a: Secondary | ICD-10-CM | POA: Diagnosis not present

## 2023-05-06 DIAGNOSIS — I5022 Chronic systolic (congestive) heart failure: Secondary | ICD-10-CM | POA: Diagnosis present

## 2023-05-06 DIAGNOSIS — Z79899 Other long term (current) drug therapy: Secondary | ICD-10-CM | POA: Insufficient documentation

## 2023-05-06 DIAGNOSIS — Z95810 Presence of automatic (implantable) cardiac defibrillator: Secondary | ICD-10-CM | POA: Diagnosis not present

## 2023-05-06 DIAGNOSIS — I4729 Other ventricular tachycardia: Secondary | ICD-10-CM

## 2023-05-06 DIAGNOSIS — I472 Ventricular tachycardia, unspecified: Secondary | ICD-10-CM | POA: Insufficient documentation

## 2023-05-06 DIAGNOSIS — I428 Other cardiomyopathies: Secondary | ICD-10-CM | POA: Diagnosis not present

## 2023-05-06 DIAGNOSIS — D8685 Sarcoid myocarditis: Secondary | ICD-10-CM

## 2023-05-06 DIAGNOSIS — I272 Pulmonary hypertension, unspecified: Secondary | ICD-10-CM | POA: Diagnosis not present

## 2023-05-06 DIAGNOSIS — R590 Localized enlarged lymph nodes: Secondary | ICD-10-CM | POA: Diagnosis not present

## 2023-05-06 DIAGNOSIS — I493 Ventricular premature depolarization: Secondary | ICD-10-CM | POA: Diagnosis not present

## 2023-05-06 NOTE — Patient Instructions (Addendum)
 Great to see you today!!!  No changes were made, please continue current medications  Your physician recommends that you schedule a follow-up appointment in: 6 months (October), **PLEASE CALL OUR OFFICE IN AUGUST TO SCHEDULE THIS APPOINTMENT  If you have any questions or concerns before your next appointment please send [place] a message through [place] or call our office at [phone].    TO LEAVE A MESSAGE FOR THE NURSE SELECT OPTION 2, PLEASE LEAVE A MESSAGE INCLUDING: YOUR NAME DATE OF BIRTH CALL BACK NUMBER REASON FOR CALL**this is important as we prioritize the call backs  YOU WILL RECEIVE A CALL BACK THE SAME DAY AS LONG AS YOU CALL BEFORE 4:00 PM  At the [facility], you and your health needs are our priority. As part of our continuing mission to provide you with exceptional heart care, we have created designated Provider Care Teams. These Care Teams include your primary Cardiologist (physician) and Advanced Practice Providers (APPs- Physician Assistants and Nurse Practitioners) who all work together to provide you with the care you need, when you need it.   You may see any of the following providers on your designated Care Team at your next follow up: Dr Arvilla Meres Dr Marca Ancona Dr. Dorthula Nettles Dr. Clearnce Hasten Amy Filbert Schilder, NP Robbie Lis, [place] [facility] [place], [place] Brynda Peon, NP [place] Lee, NP Clarisa Kindred, NP Karle Plumber, PharmD Enos Fling, PharmD   Please be sure to bring in all your medications bottles to every appointment.    Thank you for choosing [facility]

## 2023-05-06 NOTE — Progress Notes (Addendum)
 [facility] Note   PCP: Dois Davenport, MD PCP-Cardiologist: Chilton Si, MD  HF Cardiologist: Dr. Gala Romney  HPI: Nathan [name] is a 58 y.o. male with history of chronic systolic heart failure, mitral regurgitation, VT s/p ICD, cardiac sarcoidosis and peripheral focal chorioretinal inflammation of both eyes diagnosed in 2020 (followed at [facility]mology).     Admitted 10/12/20 with SOB. CT chest with bulky mediastinal lymphadenopathy. Pulmonary consulted, underwent bronchoscopy + biopsy. Path showed no malignancy. Echo showed EF 25 % with reduced RV. Underwent R/LHC with normal cors and stable hemodynamics. cMRI that showed EF 18% with LGE pattern consistent with cardiac sarcoidosis. High PVC burden and started on mexilitene 200 mg bid. Discharged with Life Vest due to frequent PVCs.   Re-admitted 10/25/20 after LifeVest alarmed several times. Had frequent runs of NSVT and > 25% PVC burden despite mexilitene.EP consulted and d/t extensive transmural LGE and severely reduced LVEF, ICD placed for primary prevention. Had hypotension with amiodarone gtt and was started on midodrine. GDMT limited by hypotension. Discharge weight 211-215 lbs  Echo 02/07/21 EF 30-35% RV normal.   PET 9/23 1. There are few segments with hypermetabolic activity suggestive of an active inflammatory process in the LV. LVEF 26% 2. Approximately 5% of the left ventricle is involved with predominantly mild/moderate hypermetabolic activity.  3. There is no evidence of abnormal metabolism in the right ventricle.  4.There are extensive areas of extracardiac hypermetabolic activity noted on the limited field of view, specifically "bulky"        bilateral hilar and mediastinal lymph nodes. If clinically indicated/concern for systemic disease, a whole body PET/CT        may be obtained for further evaluation.   Follow up 12/23, off pred and on MTX 20 mg per week.  Echo 12/24 EF < 20% RV mildly  reduced  Now on methotrexate 20 qweek and Infiximab q8 weeks.   Repeat PET 03/11/23: EF 19% severely dilated LV. No sarcoid activity   Has been following with Dr. Everardo All in Pulmonary. Repeat PET 2/25 with no cardiac sarcoid activity. Feels pretty good. Gets winded with moderate activity but no problems with basic activities. Gets dizzy when he bends over. No orthostasis. No ICD firing. No edema, orthopnea or PND.   Cardiac Studies: - Echo (1/23) EF 30-35%, RV normal. - Echo (9/22): EF 25%, reduced RV. - cMRI (9/22): EF 18% with LGE pattern consistent with cardiac sarcoidosis. - R/LHC (9/22): normal cors, mild to moderate pulmonary hypertension 2/2 to Nathan and elevated LVEDP from left-sided failure. PAP 46/19 mm with a mean of 33 mmHg.   PCWP 27 mmHg and LVEDP 30 mmHg.  In the setting of systolic pressures 90/20 mmHg Cardiac Output (Fick) 6.75-3.06.-Index SVR 10.96, PVR 0.9 (Wood units)  Past Medical History:  Diagnosis Date   Allergies    09/16/2020   BPH (benign prostatic hyperplasia)    ED (erectile dysfunction)    GERD (gastroesophageal reflux disease)    Osteoarthritis    Peripheral focal chorioretinal inflammation of both eyes    Retinal vasculitis of both eyes    Sarcoidosis    Current Outpatient Medications  Medication Sig Dispense Refill   Blood Pressure Monitoring (OMRON 3 SERIES BP MONITOR) DEVI Use as directed 1 each 0   carvedilol (COREG) 3.125 MG tablet Take 1 tablet (3.125 mg total) by mouth 2 (two) times daily. 60 tablet 11   cetirizine (ZYRTEC) 10 MG tablet Take 10 mg by mouth as needed.  dapagliflozin propanediol (FARXIGA) 10 MG TABS tablet Take 1 tablet (10 mg total) by mouth daily. 90 tablet 3   diclofenac Sodium (VOLTAREN) 1 % GEL Apply 2 g topically 4 (four) times daily as needed. 200 g 1   folic acid (FOLVITE) 1 MG tablet Take 2 tablets (2 mg total) by mouth daily. 60 tablet 11   furosemide (LASIX) 20 MG tablet Take 1 tablet (20 mg total) by mouth daily as  needed. 30 tablet 3   inFLIXimab-axxq (AVSOLA IV) Inject into the vein. 3mg /kg at Week 0, 2, and 6, then 3mg /kg every 8 weeks thereafter     losartan (COZAAR) 25 MG tablet Take 1/2 tablet (12.5 mg total) by mouth daily. NEEDS FOLLOW UP APPOINTMENT FOR MORE REFILLS 45 tablet 0   Magnesium Oxide 250 MG TABS take 1 tablet by mouth every day as needed 90 tablet 0   methotrexate (RHEUMATREX) 2.5 MG tablet Take 8 tablets (20 mg total) by mouth as directed every Wednesday. Caution: Chemotherapy, protect from light. 96 tablet 1   Multiple Vitamin (MULTIVITAMIN ADULT PO) Take by mouth.     omeprazole (PRILOSEC) 40 MG capsule Take 1 capsule (40 mg total) by mouth every morning. 90 capsule 3   sildenafil (REVATIO) 20 MG tablet Take 2-3 tablets (40-60 mg total) by mouth as needed. 50 tablet 0   spironolactone (ALDACTONE) 25 MG tablet Take 1 tablet (25 mg total) by mouth daily. 90 tablet 3   amiodarone (PACERONE) 200 MG tablet Take 1 tablet (200 mg total) by mouth daily at 12 noon. Monday-Friday only 30 tablet 6   metFORMIN (GLUCOPHAGE-XR) 500 MG 24 hr tablet Take 1 tablet (500 mg total) by mouth daily with evening meal (Patient not taking: Reported on 05/06/2023) 90 tablet 0   tamsulosin (FLOMAX) 0.4 MG CAPS capsule Take 1 capsule (0.4 mg total) by mouth at bedtime. 90 capsule 0   No current facility-administered medications for this encounter.   No Known Allergies  Social History   Socioeconomic History   Marital status: Married    Spouse name: Not on file   Number of children: Not on file   Years of education: Not on file   Highest education level: Not on file  Occupational History   Not on file  Tobacco Use   Smoking status: Former    Current packs/day: 0.00    Types: Cigarettes    Quit date: 02/02/2010    Years since quitting: 13.2    Passive exposure: Never   Smokeless tobacco: Never  Vaping Use   Vaping status: Never Used  Substance and Sexual Activity   Alcohol use: Yes    Alcohol/week:  1.0 standard drink of alcohol    Types: 1 Cans of beer per week   Drug use: Not Currently   Sexual activity: Not on file  Other Topics Concern   Not on file  Social History Narrative   Not on file   Social Drivers of Health   Financial Resource Strain: Low Risk  (10/21/2020)   Overall Financial Resource Strain (CARDIA)    Difficulty of Paying Living Expenses: Not very hard  Food Insecurity: No Food Insecurity (10/21/2020)   Hunger Vital Sign    Worried About Running Out of Food in the Last Year: Never true    Ran Out of Food in the Last Year: Never true  Transportation Needs: No Transportation Needs (10/21/2020)   PRAPARE - Transportation    Lack of Transportation (Medical): No    Lack  of Transportation (Non-Medical): No  Physical Activity: Not on file  Stress: Not on file  Social Connections: Not on file  Intimate Partner Violence: Not on file   Family History  Problem Relation Age of Onset   Stroke Mother    Chronic Renal Failure Mother    Dementia Mother    Cancer Father 5   Diabetes Father    Pancreatic cancer Father    BP 110/72   Pulse 75   Ht 5\' 8"  (1.727 m)   Wt 97.3 kg (214 lb 6.4 oz)   SpO2 97%   BMI 32.60 kg/m   Wt Readings from Last 3 Encounters:  05/06/23 97.3 kg (214 lb 6.4 oz)  03/15/23 97.3 kg (214 lb 6.4 oz)  02/24/23 99.3 kg (219 lb)   PHYSICAL EXAM: General:  Well appearing. No resp difficulty HEENT: normal Neck: supple. no JVD. Carotids 2+ bilat; no bruits. No lymphadenopathy or thryomegaly appreciated. Cor: PMI nondisplaced. Regular rate & rhythm. No rubs, gallops or murmurs. Lungs: clear Abdomen: soft, nontender, nondistended. No hepatosplenomegaly. No bruits or masses. Good bowel sounds. Extremities: no cyanosis, clubbing, rash, edema Neuro: alert & orientedx3, cranial nerves grossly intact. moves all 4 extremities w/o difficulty. Affect pleasant  ECG: NSR 68 QRS 108 No ST-T wave abnormalities. Personally reviewed  ASSESSMENT &  PLAN:  1. Cardiac Sarcoid - cMRI 10/17/20 c/w cardiac sarcoid. - Off prednisone since 2/23 - Continue MTX 20 mg q Wednesdays - Continue folic acid, Vit D + calcium and PPI. - S/p ICD placement 09/26. - PET 9/23 LVEF 26% Mild FDG uptake in LV. Diffuse extracardiac FDG uptake suggestive of acitve systemic sarcoid - Echo 12/24 EF < 20% RV mildly reduced - Repeat PET 03/11/23: EF 19% severely dilated LV. No sarcoid activity  Personally reviewed - Sarcoid suppressed - -Continue methotrexate 20 weekly and infliximab   2. NSVT/ Frequent PVCs  - Previously on mexilitene 200 mg bid. Now on amio 200  - S/p Biotronik ICD placement 10/28/20.  - Recent ICD interrogation reviewed personally. No VT  - Continue amiodarone 200 mg M-F per EP recs. [place] with Dr. Lalla Brothers - Recent amio labs ok.   3. 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 - Echo 02/07/21 EF 30-35% RV normal. - PET scan 9/23  EF 26%  - Stable NYHA II- early III NYHA II-early III. Volume status ok, GDMT limited by orthostasis. - Continue losartan 12.5 mg daily (off Entresto with low BP). - Continue carvedilol 3.125 mg bid. - Continue Farxiga 10 mg daily. - Continue sprionolactone 25 mg daily. - Continue Lasix 20 mg PRN - Unable to tolerate further GDMT titration - We discussed possible need for advanced therapies down the road - Plan CPX in 6 months   4. CKD Stage IIIa - SCr baseline 1.4-1.8 - Labs 02/24/23 SCr 1.48 K 4.8 Personally reviewed   5. Mediastinal Lymphadenopathy - 10/17/20 bronchoscopy + biopsy.  - Path showed no malignancy or evidence of sarcoid.  - PET 9/23 suggestive of diffuse active sarcoid - Follows with Dr. Everardo All. Suppressed on current regimen.    Arvilla Meres, MD  9:08 AM

## 2023-05-11 ENCOUNTER — Ambulatory Visit (INDEPENDENT_AMBULATORY_CARE_PROVIDER_SITE_OTHER): Payer: Medicaid Other

## 2023-05-11 ENCOUNTER — Other Ambulatory Visit (HOSPITAL_COMMUNITY): Payer: Self-pay | Admitting: Internal Medicine

## 2023-05-11 VITALS — BP 107/69 | HR 57 | Temp 97.8°F | Resp 20 | Ht 68.0 in | Wt 207.4 lb

## 2023-05-11 DIAGNOSIS — D869 Sarcoidosis, unspecified: Secondary | ICD-10-CM

## 2023-05-11 DIAGNOSIS — D8685 Sarcoid myocarditis: Secondary | ICD-10-CM

## 2023-05-11 DIAGNOSIS — Z79899 Other long term (current) drug therapy: Secondary | ICD-10-CM | POA: Diagnosis not present

## 2023-05-11 MED ORDER — ACETAMINOPHEN 325 MG PO TABS
650.0000 mg | ORAL_TABLET | Freq: Once | ORAL | Status: AC
  Administered 2023-05-11: 650 mg via ORAL
  Filled 2023-05-11: qty 2

## 2023-05-11 MED ORDER — METHYLPREDNISOLONE SODIUM SUCC 40 MG IJ SOLR
40.0000 mg | Freq: Once | INTRAMUSCULAR | Status: AC
Start: 2023-05-11 — End: 2023-05-11
  Administered 2023-05-11: 40 mg via INTRAVENOUS
  Filled 2023-05-11: qty 1

## 2023-05-11 MED ORDER — DIPHENHYDRAMINE HCL 25 MG PO CAPS
25.0000 mg | ORAL_CAPSULE | Freq: Once | ORAL | Status: AC
  Administered 2023-05-11: 25 mg via ORAL
  Filled 2023-05-11: qty 1

## 2023-05-11 MED ORDER — SODIUM CHLORIDE 0.9 % IV SOLN
5.0000 mg/kg | Freq: Once | INTRAVENOUS | Status: AC
  Administered 2023-05-11: 500 mg via INTRAVENOUS
  Filled 2023-05-11: qty 50

## 2023-05-11 NOTE — Progress Notes (Signed)
 Diagnosis: Sarcoidosis  Provider:  Chilton Greathouse MD  Procedure: IV Infusion  IV Type: Peripheral, IV Location: R Antecubital  Avsola (infliximab-axxq), Dose: 500 mg  Infusion Start Time: 1039  Infusion Stop Time: 1248  Post Infusion IV Care: Peripheral IV Discontinued  Discharge: Condition: Good, Destination: Home . AVS Declined  Performed by:  Loney Hering, LPN

## 2023-05-13 ENCOUNTER — Other Ambulatory Visit (HOSPITAL_BASED_OUTPATIENT_CLINIC_OR_DEPARTMENT_OTHER): Payer: Self-pay | Admitting: Pulmonary Disease

## 2023-05-13 ENCOUNTER — Other Ambulatory Visit (HOSPITAL_COMMUNITY): Payer: Self-pay

## 2023-05-13 DIAGNOSIS — D869 Sarcoidosis, unspecified: Secondary | ICD-10-CM

## 2023-05-13 MED ORDER — METHOTREXATE SODIUM 2.5 MG PO TABS
20.0000 mg | ORAL_TABLET | ORAL | 1 refills | Status: DC
  Filled 2023-05-13: qty 32, 28d supply, fill #0

## 2023-05-14 ENCOUNTER — Other Ambulatory Visit (HOSPITAL_COMMUNITY): Payer: Self-pay

## 2023-05-14 ENCOUNTER — Encounter: Payer: Self-pay | Admitting: Pulmonary Disease

## 2023-05-14 LAB — LAB REPORT - SCANNED
A1c: 4.7
Calcium: 9.8
Creatinine, POC: 171.3 mg/dL
EGFR: 53
PTH: 31

## 2023-05-14 MED ORDER — OMEPRAZOLE 40 MG PO CPDR
40.0000 mg | DELAYED_RELEASE_CAPSULE | Freq: Every morning | ORAL | 3 refills | Status: AC
  Filled 2023-05-14 – 2023-06-14 (×3): qty 90, 90d supply, fill #0
  Filled 2023-07-06: qty 30, 30d supply, fill #0
  Filled 2023-09-06: qty 30, 30d supply, fill #1
  Filled 2023-11-15: qty 30, 30d supply, fill #2
  Filled 2024-01-22: qty 30, 30d supply, fill #3
  Filled 2024-03-02: qty 30, 30d supply, fill #4

## 2023-05-17 ENCOUNTER — Ambulatory Visit: Payer: 59 | Admitting: Internal Medicine

## 2023-05-17 DIAGNOSIS — D8685 Sarcoid myocarditis: Secondary | ICD-10-CM

## 2023-05-17 DIAGNOSIS — I5041 Acute combined systolic (congestive) and diastolic (congestive) heart failure: Secondary | ICD-10-CM

## 2023-05-17 DIAGNOSIS — S46219A Strain of muscle, fascia and tendon of other parts of biceps, unspecified arm, initial encounter: Secondary | ICD-10-CM

## 2023-05-17 DIAGNOSIS — D869 Sarcoidosis, unspecified: Secondary | ICD-10-CM

## 2023-05-19 ENCOUNTER — Telehealth (HOSPITAL_BASED_OUTPATIENT_CLINIC_OR_DEPARTMENT_OTHER): Payer: Self-pay | Admitting: Pulmonary Disease

## 2023-05-19 NOTE — Telephone Encounter (Signed)
 Created in error

## 2023-05-21 ENCOUNTER — Encounter (HOSPITAL_COMMUNITY)
Admission: RE | Admit: 2023-05-21 | Discharge: 2023-05-21 | Disposition: A | Discharge status: Home / Self Care | Source: Ambulatory Visit | Attending: Pulmonary Disease | Admitting: Pulmonary Disease

## 2023-05-21 DIAGNOSIS — R911 Solitary pulmonary nodule: Secondary | ICD-10-CM | POA: Diagnosis present

## 2023-05-21 DIAGNOSIS — R918 Other nonspecific abnormal finding of lung field: Secondary | ICD-10-CM | POA: Diagnosis not present

## 2023-05-21 LAB — GLUCOSE, CAPILLARY: Glucose-Capillary: 94 mg/dL (ref 70–99)

## 2023-05-21 MED ORDER — FLUDEOXYGLUCOSE F - 18 (FDG) INJECTION
10.2600 | Freq: Once | INTRAVENOUS | Status: AC
  Administered 2023-05-21: 10.26 via INTRAVENOUS

## 2023-05-24 ENCOUNTER — Telehealth (HOSPITAL_COMMUNITY): Payer: Self-pay | Admitting: *Deleted

## 2023-05-24 NOTE — Telephone Encounter (Signed)
 Received OV note and labs from [facility], pt had visit 05/12/23, K 5.3 they advised pt to decrease Spiro to 12.5 mg Daily  Med list updated, info sent to Dr Bensimhon as FYI

## 2023-05-25 ENCOUNTER — Ambulatory Visit (HOSPITAL_BASED_OUTPATIENT_CLINIC_OR_DEPARTMENT_OTHER): Payer: BC Managed Care – PPO | Admitting: Pulmonary Disease

## 2023-05-25 ENCOUNTER — Encounter (HOSPITAL_BASED_OUTPATIENT_CLINIC_OR_DEPARTMENT_OTHER): Payer: Self-pay | Admitting: Pulmonary Disease

## 2023-05-25 ENCOUNTER — Other Ambulatory Visit (HOSPITAL_COMMUNITY): Payer: Self-pay

## 2023-05-25 ENCOUNTER — Other Ambulatory Visit: Payer: Self-pay

## 2023-05-25 VITALS — BP 122/80 | HR 81 | Ht 68.0 in | Wt 209.2 lb

## 2023-05-25 DIAGNOSIS — D869 Sarcoidosis, unspecified: Secondary | ICD-10-CM

## 2023-05-25 DIAGNOSIS — R911 Solitary pulmonary nodule: Secondary | ICD-10-CM | POA: Diagnosis not present

## 2023-05-25 LAB — COMPREHENSIVE METABOLIC PANEL WITH GFR
ALT: 16 IU/L (ref 0–44)
AST: 25 IU/L (ref 0–40)
Albumin: 4.2 g/dL (ref 3.8–4.9)
Alkaline Phosphatase: 68 IU/L (ref 44–121)
BUN/Creatinine Ratio: 9 (ref 9–20)
BUN: 12 mg/dL (ref 6–24)
Bilirubin Total: 0.6 mg/dL (ref 0.0–1.2)
CO2: 23 mmol/L (ref 20–29)
Calcium: 9.4 mg/dL (ref 8.7–10.2)
Chloride: 104 mmol/L (ref 96–106)
Creatinine, Ser: 1.41 mg/dL — ABNORMAL HIGH (ref 0.76–1.27)
Globulin, Total: 2 g/dL (ref 1.5–4.5)
Glucose: 94 mg/dL (ref 70–99)
Potassium: 4.6 mmol/L (ref 3.5–5.2)
Sodium: 140 mmol/L (ref 134–144)
Total Protein: 6.2 g/dL (ref 6.0–8.5)
eGFR: 58 mL/min/{1.73_m2} — ABNORMAL LOW (ref >59)

## 2023-05-25 LAB — CBC WITH DIFFERENTIAL/PLATELET
Basophils Absolute: 0 10*3/uL (ref 0.0–0.2)
Basos: 1 %
EOS (ABSOLUTE): 0.1 10*3/uL (ref 0.0–0.4)
Eos: 2 %
Hematocrit: 40 % (ref 37.5–51.0)
Hemoglobin: 13.3 g/dL (ref 13.0–17.7)
Immature Grans (Abs): 0 10*3/uL (ref 0.0–0.1)
Immature Granulocytes: 0 %
Lymphocytes Absolute: 1.2 10*3/uL (ref 0.7–3.1)
Lymphs: 29 %
MCH: 30.2 pg (ref 26.6–33.0)
MCHC: 33.3 g/dL (ref 31.5–35.7)
MCV: 91 fL (ref 79–97)
Monocytes Absolute: 0.6 10*3/uL (ref 0.1–0.9)
Monocytes: 15 %
Neutrophils Absolute: 2.2 10*3/uL (ref 1.4–7.0)
Neutrophils: 53 %
Platelets: 274 10*3/uL (ref 150–450)
RBC: 4.4 x10E6/uL (ref 4.14–5.80)
RDW: 15.3 % (ref 11.6–15.4)
WBC: 4.1 10*3/uL (ref 3.4–10.8)

## 2023-05-25 MED ORDER — METHOTREXATE SODIUM 2.5 MG PO TABS
20.0000 mg | ORAL_TABLET | ORAL | 2 refills | Status: AC
Start: 2023-05-25 — End: ?

## 2023-05-25 NOTE — Patient Instructions (Addendum)
 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 Villa Greaser end of July

## 2023-05-25 NOTE — Progress Notes (Signed)
 Subjective:   PATIENT ID: Nathan [name] GENDER: male DOB: [birth year], MRN: [id number]  Chief Complaint  Patient presents with   Follow-up    Sarcoidosis    Reason for Visit: Follow-up  [name] is a 58 year old male former smoker with sarcoid with cardiac/pulmonary/ocular involvement, onchronic immunosuppressants, VT s/p ICD, chronic systolic heart failure, HTN, DM2 and osteoarthritis who presents for follow-up.   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 He was then admitted in 10/2020 for new heart failure and cMRI consistent with cardiac sarcoid. Bronchoscopy for mediastinal adenopathy was not diagnostic of sarcoid but was negative for malignancy. He was started on prednisone  and methotrexate . Prednisone  was discontinued in 03/2021 and he continued methotrexate  20 mg weekly. PET/CT sarcoid protocol 10/27/21 at [facility] demonstrated 5% cardiac involvement as well as diffuse pulmonary involvement. He was restarted on prednisone  until he could be scheduled for pulmonary consultation.  05/07/22 He reports his symptoms are well controlled on prednisone  and methotrexate . Though after restarting prednisone  he is unsure if it has provided benefit and only taking it due to the abnormal PET/CT. Denies shortness of breath, cough, wheezing, chest pain, fatigue.  05/21/22 Since our last visit continues to have visual blurriness. Has not been contacted by Nephrology. Prefers to go to local Ophthalmologist and requesting referral. Compliant with prednisone  and methotrexate . Denies any respiratory complaints  08/19/22 Since our last visit he has been started on infliximab  06/04/22. Has received total of 3 doses. Has tolerated it well with no complaints. Has been on methotrexate  20 mg weekly and prednisone  20 mg daily. Compliant with bactrim  and PPI. Denies  shortness of breath, cough, wheezing, chest pain, lower extremity edema.  11/11/22 Since our last visit he is tolerating infliximab  and methotrexate . He is down to prednisone  5 mg daily.  02/24/23 Since our last visit he has been weaned off prednisone  since October. Has been taking methotrexate  20 mg weekly and infliximab  every 2 months. Has been on infliximab  for >6 months. Denies shortness of breath, cough, wheezing, lower extremity edema or chest pain.  05/25/23 He is compliant on methotrexate , infliximab  and folic acid . Tolerating medications well with side effects. Infliximab  was increased to 5 mg/kg 02/24/23 due to depressed EF on 02/02/24 echocardiogram. Follow-up cardiac PET on 2/25 with no evidence of cardiac sarcoid however EF remains depressed. Also lung nodule was noted to be 13x 9mm found and PET/CT ordered. Denies shortness of breath, cough or wheezing. Trying to go to the gym.  Social History: Quit smoking 2000s. Social smoker. Quit in 2012 Cowboy boots/Shoe fan  Past Medical History:  Diagnosis Date   Allergies    09/16/2020   BPH (benign prostatic hyperplasia)    ED (erectile dysfunction)    GERD (gastroesophageal reflux disease)    Osteoarthritis    Peripheral focal chorioretinal inflammation of both eyes    Retinal vasculitis of both eyes    Sarcoidosis      Family History  Problem Relation Age of Onset   Stroke Mother    Chronic Renal Failure Mother    Dementia Mother    Cancer Father 79   Diabetes Father    Pancreatic cancer Father      Social History   Occupational History   Not on file  Tobacco Use   Smoking status: Former    Current packs/day: 0.00    Types:  Cigarettes    Quit date: 02/02/2010    Years since quitting: 13.3    Passive exposure: Never   Smokeless tobacco: Never  Vaping Use   Vaping status: Never Used  Substance and Sexual Activity   Alcohol use: Yes    Alcohol/week: 1.0 standard drink of alcohol    Types: 1 Cans of beer per week    Drug use: Not Currently   Sexual activity: Not on file    No Known Allergies   Outpatient Medications Prior to Visit  Medication Sig Dispense Refill   amiodarone  (PACERONE ) 200 MG tablet Take 1 tablet (200 mg total) by mouth daily at 12 noon. Monday-Friday only 30 tablet 6   Blood Pressure Monitoring (OMRON 3 SERIES BP MONITOR) DEVI Use as directed 1 each 0   carvedilol  (COREG ) 3.125 MG tablet Take 1 tablet (3.125 mg total) by mouth 2 (two) times daily. 60 tablet 11   cetirizine (ZYRTEC) 10 MG tablet Take 10 mg by mouth as needed.     dapagliflozin  propanediol (FARXIGA ) 10 MG TABS tablet Take 1 tablet (10 mg total) by mouth daily. 90 tablet 3   diclofenac  Sodium (VOLTAREN ) 1 % GEL Apply 2 g topically 4 (four) times daily as needed. 200 g 1   folic acid  (FOLVITE ) 1 MG tablet Take 2 tablets (2 mg total) by mouth daily. 60 tablet 11   furosemide  (LASIX ) 20 MG tablet Take 1 tablet (20 mg total) by mouth daily as needed. 30 tablet 3   inFLIXimab -axxq (AVSOLA  IV) Inject into the vein. 3mg /kg at Week 0, 2, and 6, then 3mg /kg every 8 weeks thereafter     losartan  (COZAAR ) 25 MG tablet Take 1/2 tablet (12.5 mg total) by mouth daily. NEEDS FOLLOW UP APPOINTMENT FOR MORE REFILLS 45 tablet 0   Magnesium  Oxide 250 MG TABS take 1 tablet by mouth every day as needed 90 tablet 0   metFORMIN  (GLUCOPHAGE -XR) 500 MG 24 hr tablet Take 1 tablet (500 mg total) by mouth daily with evening meal 90 tablet 0   Multiple Vitamin (MULTIVITAMIN ADULT PO) Take by mouth.     omeprazole  (PRILOSEC) 40 MG capsule Take 1 capsule (40 mg total) by mouth every morning. 90 capsule 3   sildenafil  (REVATIO ) 20 MG tablet Take 2-3 tablets (40-60 mg total) by mouth as needed. 50 tablet 0   spironolactone  (ALDACTONE ) 25 MG tablet Take 12.5 mg by mouth daily.     tamsulosin  (FLOMAX ) 0.4 MG CAPS capsule Take 1 capsule (0.4 mg total) by mouth at bedtime. 90 capsule 0   methotrexate  (RHEUMATREX) 2.5 MG tablet Take 8 tablets (20 mg total)  by mouth as directed every Wednesday. Caution: Chemotherapy, protect from light. 96 tablet 1   No facility-administered medications prior to visit.    Review of Systems  Constitutional:  Negative for chills, diaphoresis, fever, malaise/fatigue and weight loss.  HENT:  Negative for congestion.   Respiratory:  Negative for cough, hemoptysis, sputum production, shortness of breath and wheezing.   Cardiovascular:  Negative for chest pain, palpitations and leg swelling.     Objective:   Vitals:   05/25/23 0919  BP: 122/80  Pulse: 81  SpO2: 98%  Weight: 209 lb 3.2 oz (94.9 kg)  Height: 5\' 8"  (1.727 m)     SpO2: 98 %  Physical Exam: General: Well-appearing, no acute distress HENT: [place], AT Eyes: EOMI, no scleral icterus Respiratory: Clear to auscultation bilaterally.  No crackles, wheezing or rales Cardiovascular: RRR, -M/R/G, no JVD Extremities:-Edema,-tenderness  Neuro: AAO x4, CNII-XII grossly intact Psych: Normal mood, normal affect   Data Reviewed:  Imaging:  Cardiac PET/CT 10/27/21 metabolic study with F-18 FDG and scanned on PET [facility] MI:   1. There are few segments with hypermetabolic activity suggestive of an active inflammatory process in the left ventricular       myocardium.   2. Approximately 5% of the left ventricle is involved with predominantly mild/moderate hypermetabolic activity.   3. There is no evidence of abnormal metabolism in the right ventricle.   4. In the appropriate clinical context with positive biomarkers, these findings may represent active cardiac sarcoidosis.    Perfusion/Function.    Gated cardiac PET/CT rest myocardial perfusion study with [id number] demonstrates:   1. Abnormal myocardial perfusion in a non-CAD pattern.   2. Severe dilated left ventricle with severe left ventricular systolic dysfunction.    Non-diagnostic limited  CT obtained for PET attenuation correction:   1. There are no discernible coronary artery calcifications.    2. There are extensive areas of extracardiac hypermetabolic activity noted on the limited field of view, specifically "bulky"        bilateral hilar and mediastinal lymph nodes. If clinically indicated/concern for systemic disease, a whole body   Cardiac PET 03/11/23 IMPRESSION: 1. 13 x 9 mm subpleural nodule paraspinal right lower lobe. This region of the lung was obscured by atelectasis and effusion on the prior imaging study. Consider one of the following in 3 months for both low-risk and high-risk individuals: (a) repeat chest CT, (b) follow-up PET-CT, or (c) tissue sampling.  PET/CT 05/21/23 - Right lower lobe nodule with no abnormal uptake  PFT: 05/12/22 FVC 4.09 (83%) FEV1 3.50 (92%) Ratio 86  TLC 87% DLCO 77% Interpretation: Normal PFTs  Labs:    Latest Ref Rng & Units 02/24/2023    9:15 AM 11/09/2022   11:03 AM 07/20/2022   10:56 AM  CBC  WBC 3.4 - 10.8 x10E3/uL 4.5  6.5  10.4   Hemoglobin 13.0 - 17.7 g/dL 16.1  09.6  04.5   Hematocrit 37.5 - 51.0 % 39.9  42.5  39.4   Platelets 150 - 450 x10E3/uL 255  198.0  151.0       Latest Ref Rng & Units 02/24/2023    9:15 AM 11/09/2022   11:03 AM 09/03/2022    8:20 AM  BMP  Glucose 70 - 99 mg/dL 92  86  75   BUN 6 - 24 mg/dL 9  15  23    Creatinine 0.76 - 1.27 mg/dL 4.09  8.11  9.14   BUN/Creat Ratio 9 - 20 6   15    Sodium 134 - 144 mmol/L 142  140  137   Potassium 3.5 - 5.2 mmol/L 4.8  3.9  4.0   Chloride 96 - 106 mmol/L 105  108  103   CO2 20 - 29 mmol/L 24  25  22    Calcium  8.7 - 10.2 mg/dL 9.5  9.3  9.1       Latest Ref Rng & Units 02/24/2023    9:15 AM 11/09/2022   11:03 AM 09/03/2022    8:20 AM  Hepatic Function  Total Protein 6.0 - 8.5 g/dL 6.5  6.6  5.9   Albumin  3.8 - 4.9 g/dL 4.2  4.2  3.9   AST 0 - 40 IU/L 21  19  15    ALT 0 - 44 IU/L 24  13  14    Alk Phosphatase 44 - 121 IU/L 67  44  37   Total Bilirubin 0.0 - 1.2 mg/dL 0.4  0.5  0.5    Bronchoscopy 10/18/20 [facility] - no malignancy     Assessment & Plan:    Discussion: 58 year old male former smoker with sarcoid with cardiac/pulmonary/ocular involvement, on chronic immunosuppressants, VT s/p ICD, chronic systolic heart failure, HTN and DM2 who presents for follow-up of RLL pulmonary nodule incidentally seen on Cardiac PET. PET/CT reviewed and no hypermetabolic activity. Recommend 3 month CT for surveillance at next visit   On infliximab  and methotrexate  for sarcoid flare. Off prednisone  since 11/2022. Reviewed 01/2023 echocardiogram with worsening EF so inflixmab was increased to 5 mg/kg. From a cardiac standpoint, compensated heart failure. Cardiac PET neg for active sarcoid so no changes with current immunosuppression with plan to re-evaluate in fall 2025. Patient currently following Heart Failure clinic.  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 On amio. Referred to Pulm 05/2022 for multi-organ involvement --MR Cardiac 10/17/20 - Severe LV dysfunction with LGE pattern, bilateral pleural effusions --S/p ICD 10/28/20 --Echo 02/07/21 EF 30-35% --Echo 02/02/23 <20% after 6 months of infliximab  --Cardiac PET 03/11/23 - No evidence of active cardiac sarcoid --Heart failure team following and planning for repeat CPET in 11/2023  Peripheral focal chorioretinal inflammation of both eyes --Diagnosed in 2020 at [facility] --Last seen 02/07/19 with Dr. Mason Sole --Last optho visit 10/26/22 Ophthalmologist in the [place] area - no active sarcoid per patient  History of immunosuppression High risk medication  management --Methotrexate  02/2018-2021 for ocular sarcoid --Methotrexate  Started 10/2020 > on 20 mg weekly dosing since Spring 2023> currently on --Prednisone  20 mg daily 04/28/22>08/19/22 --Off prednisone  by end of 11/2022 --Infliximab  started 3 mg/kg on 06/04/22 --Infliximab  increased to 5 mg/kg on 03/15/23 --Continue infliximab  5 mg/kg q2 months and methotrexate  20 mg weekly --Continue folic acid  2 mg daily --ORDER labs: CBC with diff and CMET every 3 months  Sarcoid Monitoring --Recent chest imaging reviewed.  --Annual PFTs. Normal on 05/21/22. --Annual ophthalmology exam.  Last visit on  10/26/22  --Recent EKG and TTE reviewed as above --Routine labs as needed: CBC with diff, CMET, 1, 25 and 25 hydroxy vitamin D , urinary calcium   RLL lung nodule --PET/CT with no hypermetabolic activity --ORDER CT Chest without contrast for 08/2023  CKD IIIB --Established with Nephrology with Dr. Yvonnie Heritage  Health Maintenance Immunization History  Administered Date(s) Administered   Influenza, Seasonal, Injecte, Preservative Fre 11/11/2022   Influenza,inj,Quad PF,6+ Mos 10/15/2020   PFIZER(Purple Top)SARS-COV-2 Vaccination 07/06/2019, 07/27/2019, 02/15/2020   Pfizer Covid-19 Vaccine Bivalent Booster 61yrs & up 10/21/2020   CT Lung Screen - not qualified. Insufficient tobacco history  Orders Placed This Encounter  Procedures   CT Chest Wo Contrast    Standing Status:   Future    Expiration Date:   05/24/2024    Scheduling Instructions:     Schedule in July 2025    Preferred imaging location?:   [facility]   CBC with Differential   Comp Met (CMET)   Meds ordered this encounter  Medications   methotrexate  (RHEUMATREX) 2.5 MG tablet  Sig: Take 8 tablets (20 mg total) by mouth as directed every Wednesday. Caution: Chemotherapy, protect from light.    Dispense:  96 tablet    Refill:  2    No follow-ups on file. F/u end of July to review CT Chest for RLL lung nodule stability  I  have spent a total time of 50-minutes on the day of the appointment including chart review, data review, collecting history, coordinating care and discussing medical diagnosis and plan with the patient/family. Past medical history, allergies, medications were reviewed. Pertinent imaging, labs and tests included in this note have been reviewed and interpreted independently by me.   Gabrianna Fassnacht Genetta Kenning, MD [facility] 05/25/2023 9:42 AM  Office Number [phone]

## 2023-05-26 ENCOUNTER — Encounter (HOSPITAL_BASED_OUTPATIENT_CLINIC_OR_DEPARTMENT_OTHER): Payer: Self-pay | Admitting: Pulmonary Disease

## 2023-06-01 ENCOUNTER — Other Ambulatory Visit (HOSPITAL_COMMUNITY): Payer: Self-pay

## 2023-06-11 ENCOUNTER — Other Ambulatory Visit (HOSPITAL_COMMUNITY): Payer: Self-pay

## 2023-06-11 NOTE — Progress Notes (Signed)
 Remote ICD transmission.

## 2023-06-14 ENCOUNTER — Encounter: Payer: Self-pay | Admitting: Pulmonary Disease

## 2023-06-14 ENCOUNTER — Other Ambulatory Visit: Payer: Self-pay

## 2023-06-14 ENCOUNTER — Other Ambulatory Visit (HOSPITAL_COMMUNITY): Payer: Self-pay

## 2023-06-14 ENCOUNTER — Other Ambulatory Visit (HOSPITAL_COMMUNITY): Payer: Self-pay | Admitting: Family Medicine

## 2023-06-16 ENCOUNTER — Other Ambulatory Visit (HOSPITAL_COMMUNITY): Payer: Self-pay

## 2023-06-16 MED ORDER — CARVEDILOL 3.125 MG PO TABS
3.1250 mg | ORAL_TABLET | Freq: Two times a day (BID) | ORAL | 11 refills | Status: AC
  Filled 2023-06-16 – 2023-09-06 (×2): qty 60, 30d supply, fill #0
  Filled 2024-03-02: qty 60, 30d supply, fill #1

## 2023-06-24 ENCOUNTER — Other Ambulatory Visit (HOSPITAL_COMMUNITY): Payer: Self-pay

## 2023-06-29 IMAGING — DX DG CHEST 2V
2 series · 2 of 2 positions shown · non-contrast
Comparison: Chest x-ray dated July 20, 2019.

CLINICAL DATA: Chest pain and shortness of breath.

EXAM:
CHEST - 2 VIEW

[chest pa]
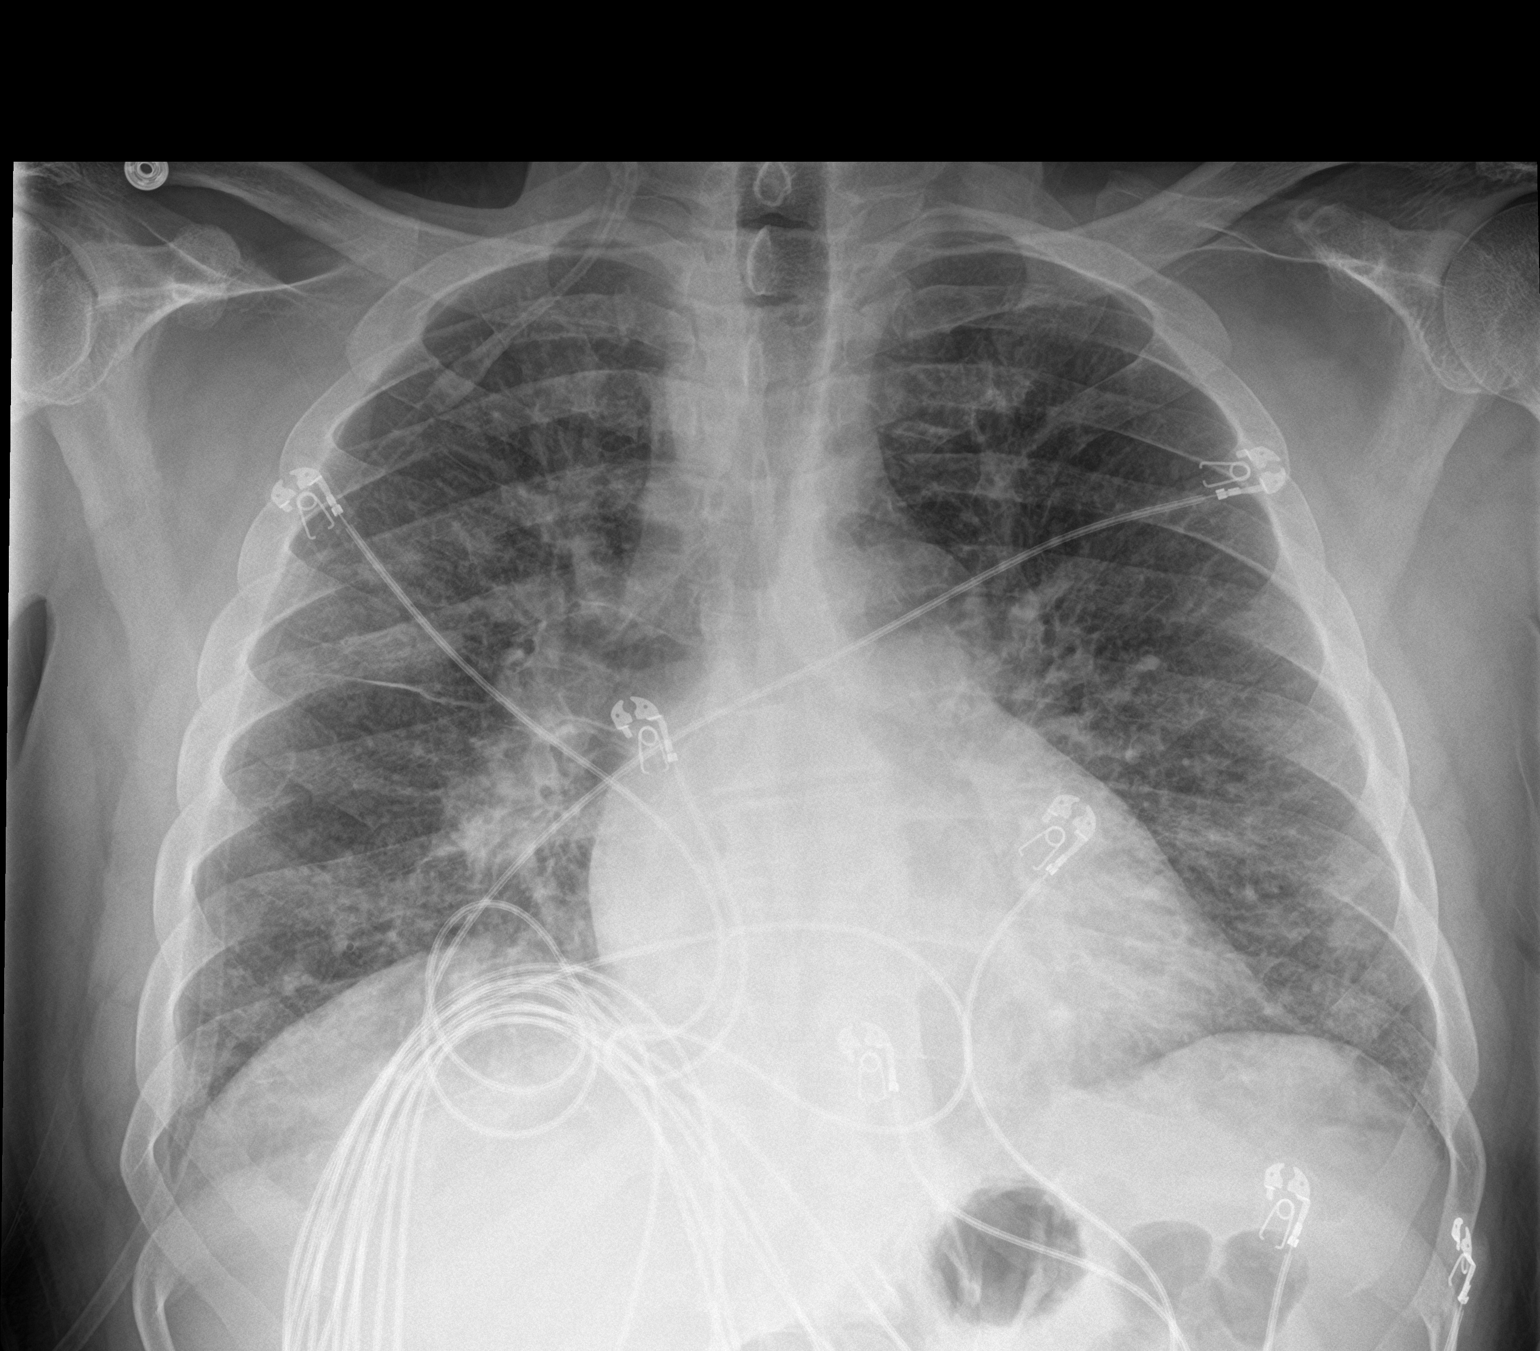

[chest lat]
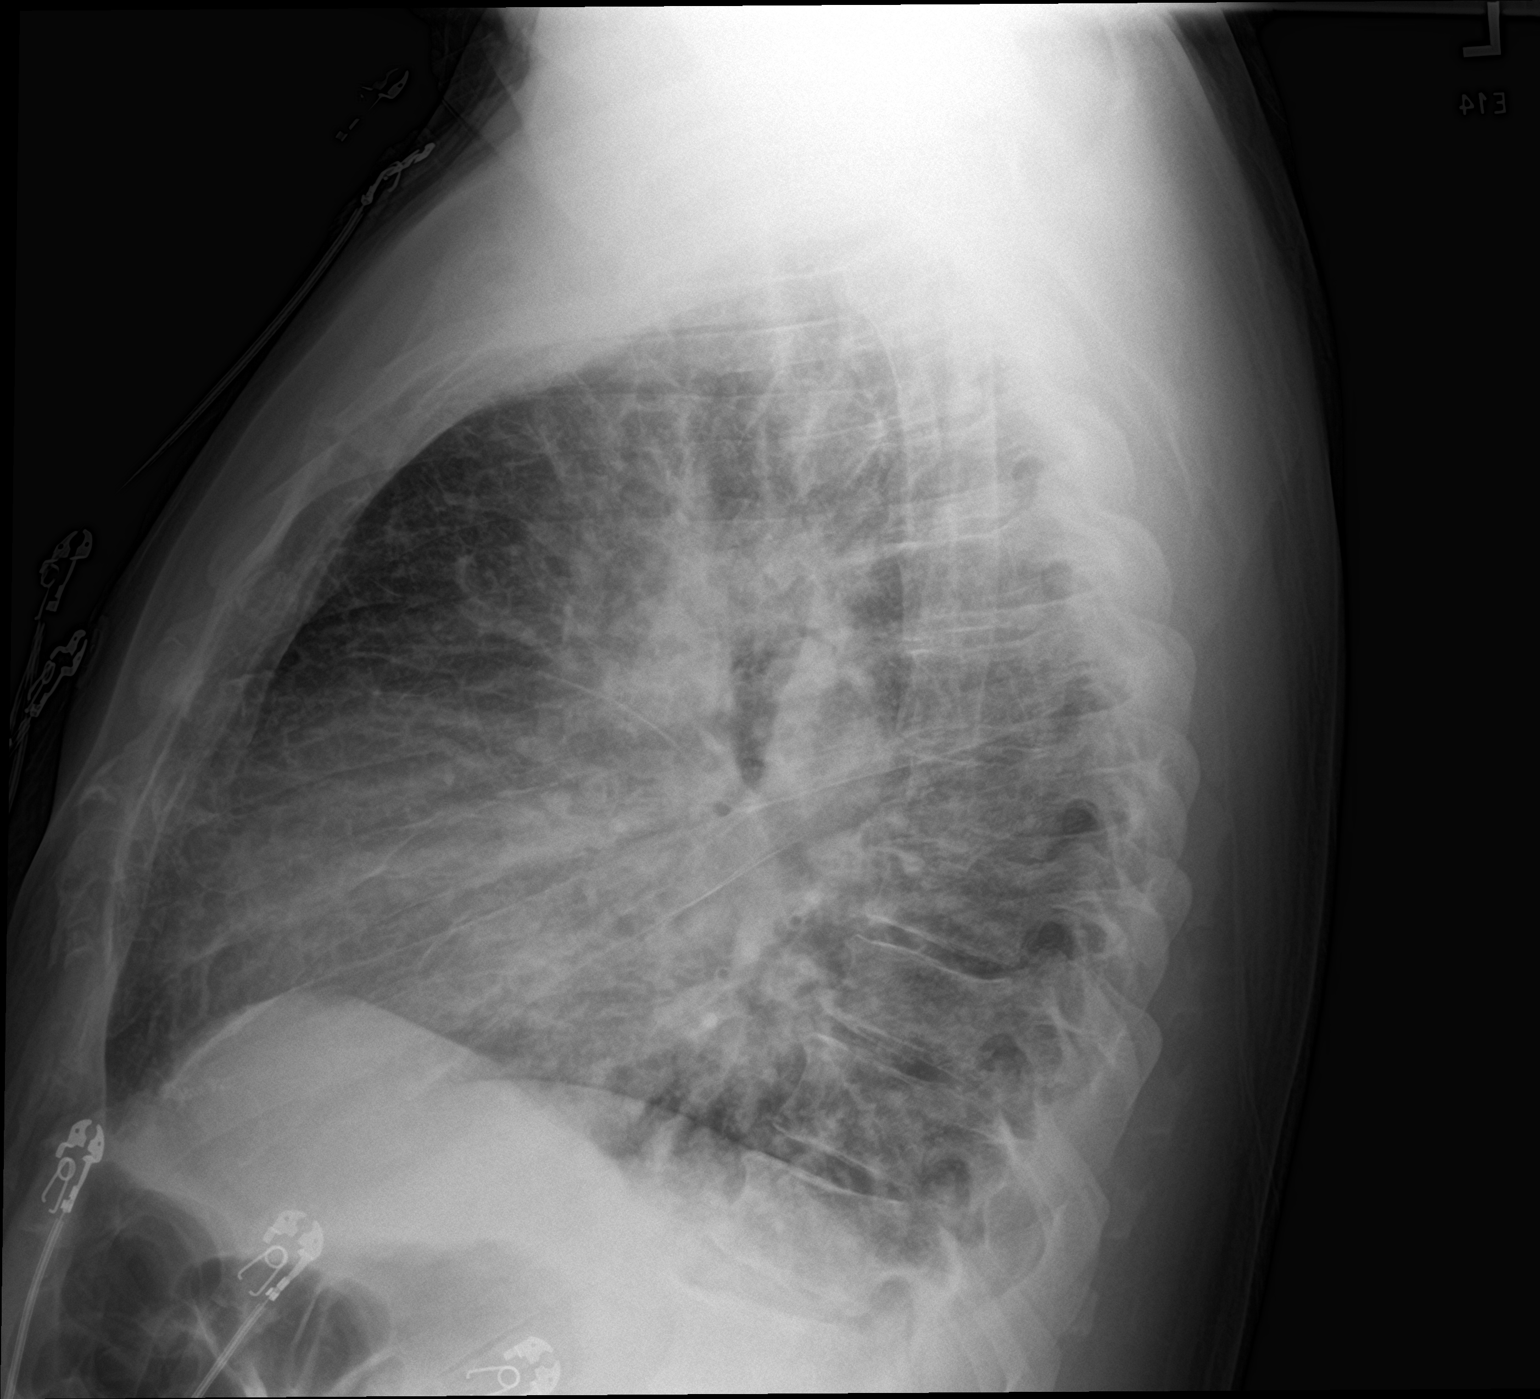

[2 of 2 positions shown; findings below may reference images not displayed]

FINDINGS: The heart size and mediastinal contours are within normal limits.
Patchy interstitial and hazy airspace opacities in both lungs. No
pleural effusion or pneumothorax. No acute osseous abnormality.
IMPRESSION: 1. Bilateral patchy interstitial and hazy airspace opacities,
concerning for multifocal pneumonia, less likely pulmonary edema.

## 2023-06-29 IMAGING — CT CT ANGIO CHEST
2 of 7 series · 17 of 46 positions shown · IV contrast (APPLIED)
Comparison: June 30, 2019

CLINICAL DATA: Suspected pulmonary embolus.

EXAM:
CT ANGIOGRAPHY CHEST WITH CONTRAST
TECHNIQUE: Multidetector CT imaging of the chest was performed using the
standard protocol during bolus administration of intravenous
contrast. Multiplanar CT image reconstructions and MIPs were
obtained to evaluate the vascular anatomy.
CONTRAST:  75mL OMNIPAQUE IOHEXOL 350 MG/ML SOLN

[Series 7: thins · axial · 0.75mm/px · z∈[+1324,+1581]mm · 14 of 416 slices shown]
[im 24/416  lung]
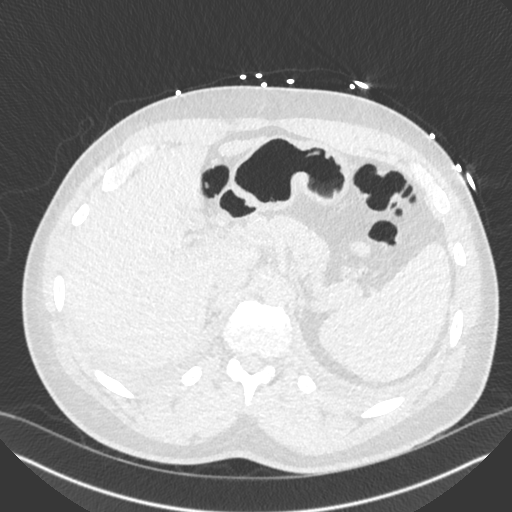
[im 47/416  soft-tissue]
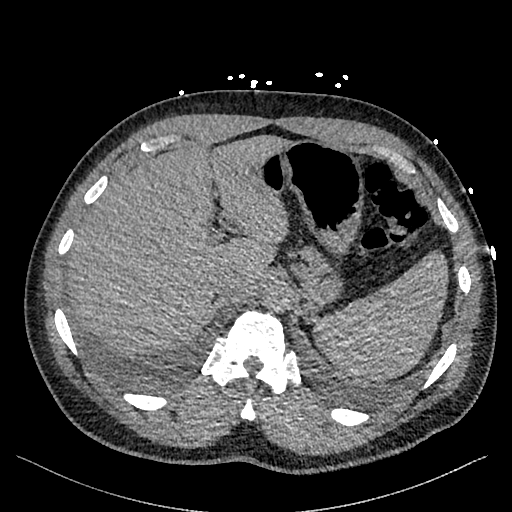
[im 93/416  lung]
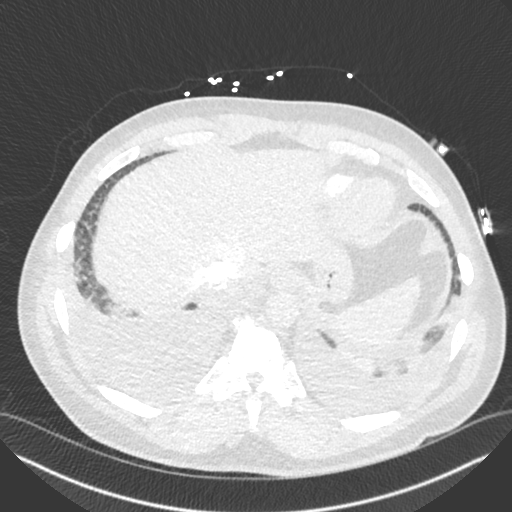
[im 116/416  soft-tissue]
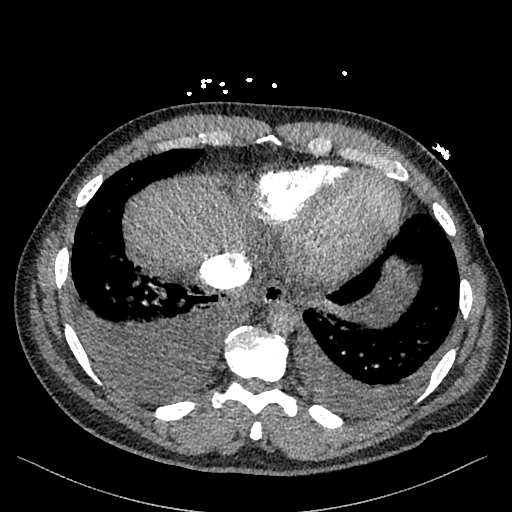
[im 139/416  lung]
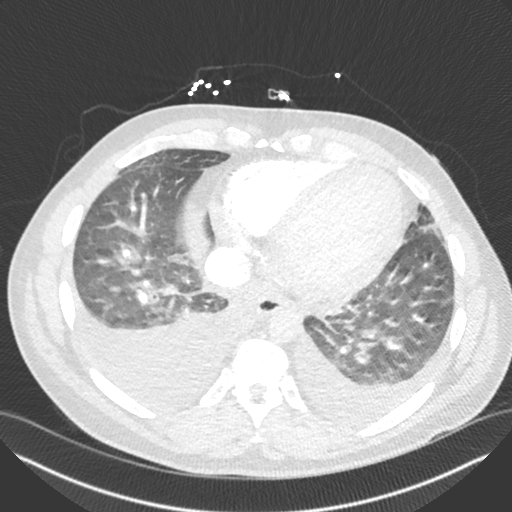
[im 162/416  soft-tissue]
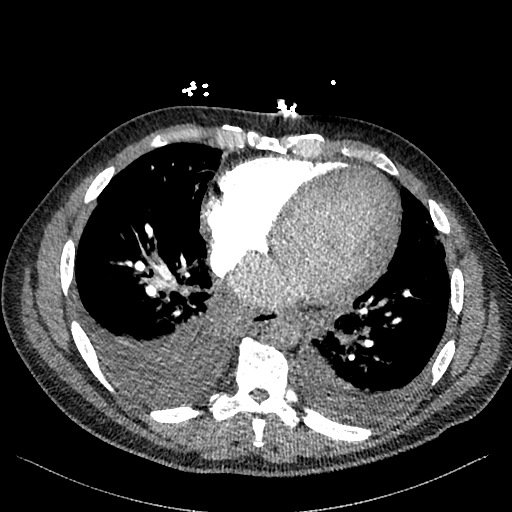
[im 185/416  lung]
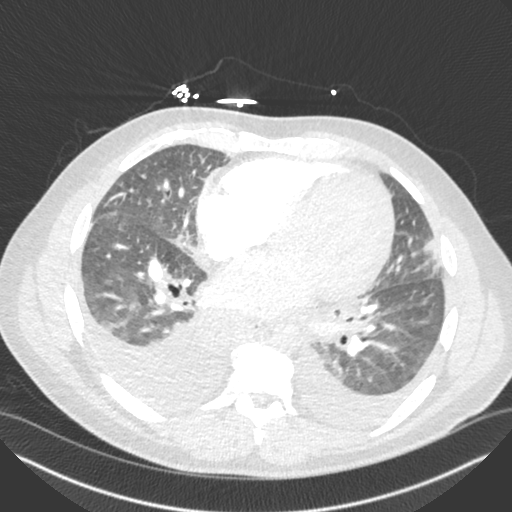
[im 231/416  soft-tissue]
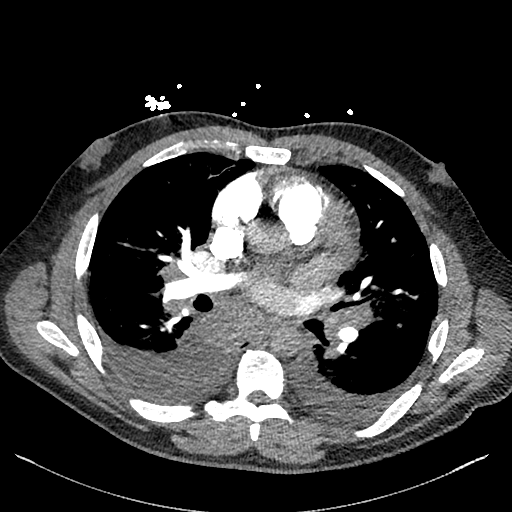
[im 254/416  lung]
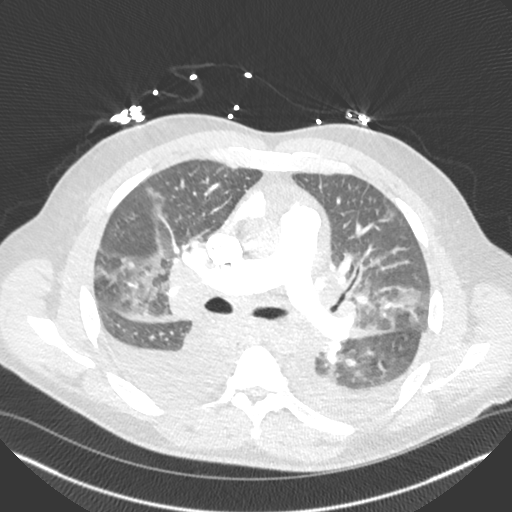
[im 277/416  soft-tissue]
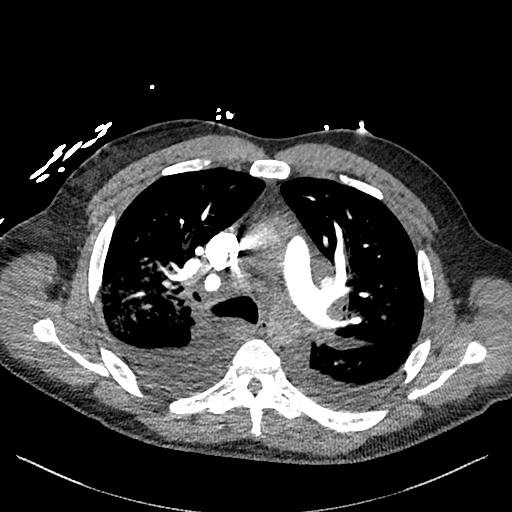
[im 300/416  lung]
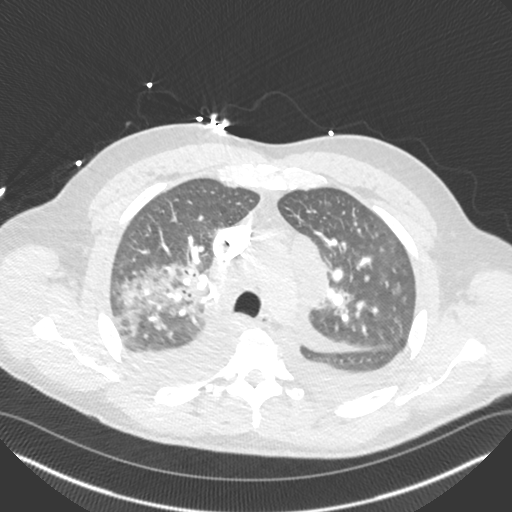
[im 323/416  soft-tissue]
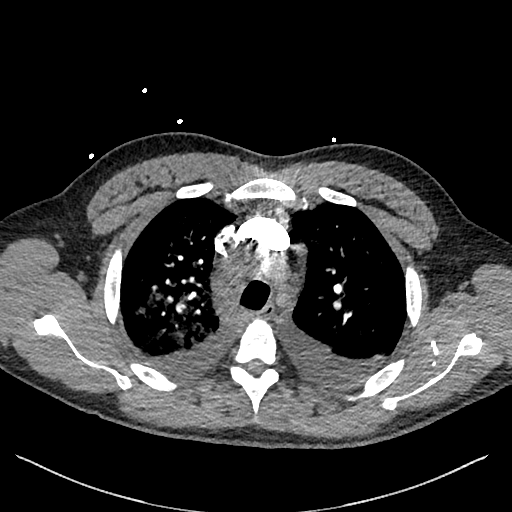
[im 369/416  lung]
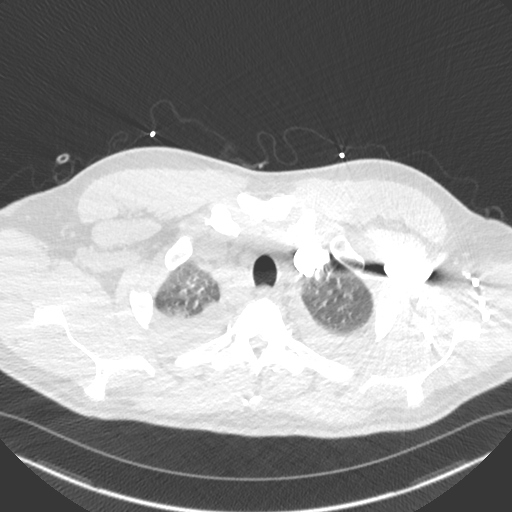
[im 392/416  soft-tissue]
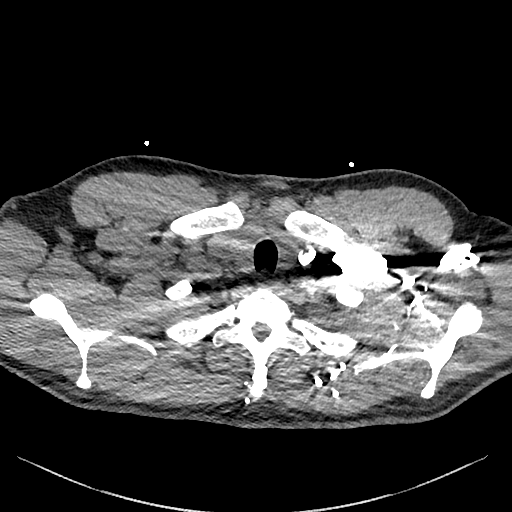

[Series 8: cor · coronal · 0.57mm/px · 3 of 165 slices shown]
[im 42/165  soft-tissue]
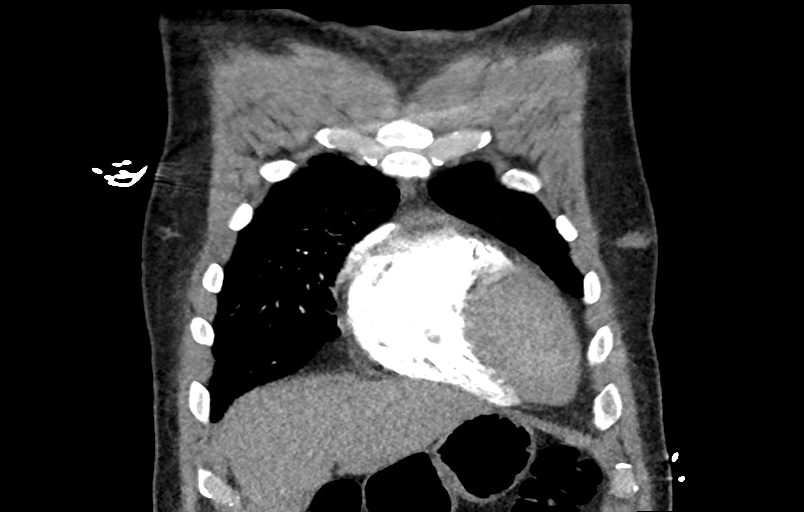
[im 83/165  soft-tissue]
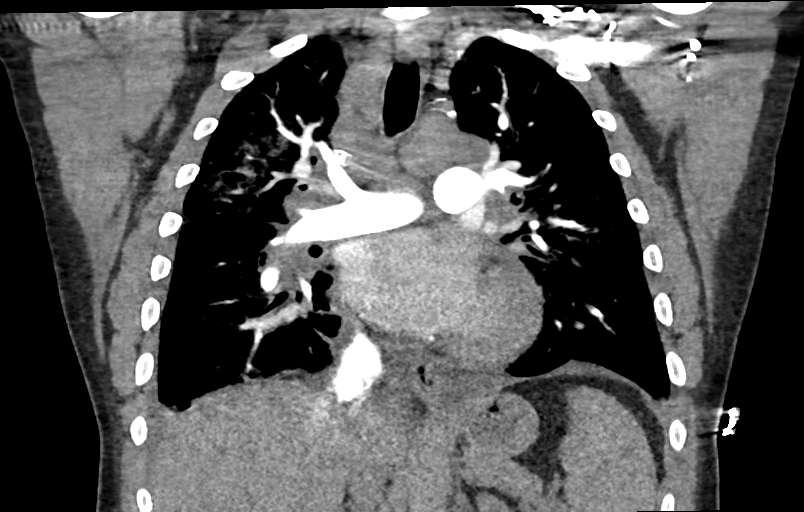
[im 124/165  soft-tissue]
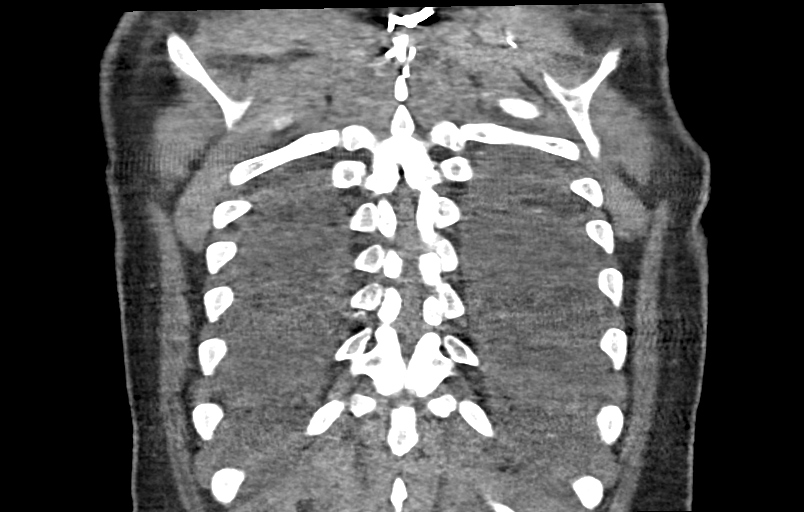

[17 of 46 positions shown; findings below may reference images not displayed]

FINDINGS: Cardiovascular: Satisfactory opacification of the pulmonary arteries
to the segmental level. No evidence of pulmonary embolism. Normal
heart size. No pericardial effusion.

Mediastinum/Nodes: Conglomerates of bulky abnormal lymph nodes are
seen in right pretracheal, left prevascular, bilateral hilar and
infra carinal regions. These measure up to 5 cm in diameter.

Lungs/Pleura: Bilateral pleural effusions, small on the left and
moderate on the right. Patchy ground-glass opacities with central
predominance are seen in both lungs, disproportionately affecting
the right upper lobe.

Upper Abdomen: No acute abnormality.

Musculoskeletal: No chest wall abnormality. No acute or significant
osseous findings.

Review of the MIP images confirms the above findings.
IMPRESSION: 1. No evidence of pulmonary embolism.
2. Multifocal bulky mediastinal and hilar lymphadenopathy. These may
represent lymphoproliferative disorder, malignant or reactive
lymphadenopathy.
3. Bilateral pleural effusions, small on the left and moderate on
the right.
4. Patchy ground-glass opacities with central predominance in both
lungs, disproportionately affecting the right upper lobe. This may
represent infectious/inflammatory pneumonitis or asymmetric mixed
pattern pulmonary edema.

## 2023-07-01 IMAGING — CR DG CHEST 2V
2 series · 2 of 2 positions shown · non-contrast
Comparison: 10/12/2020

CLINICAL DATA: Pleural effusion, mediastinal adenopathy.

EXAM:
CHEST - 2 VIEW

[chest lat]
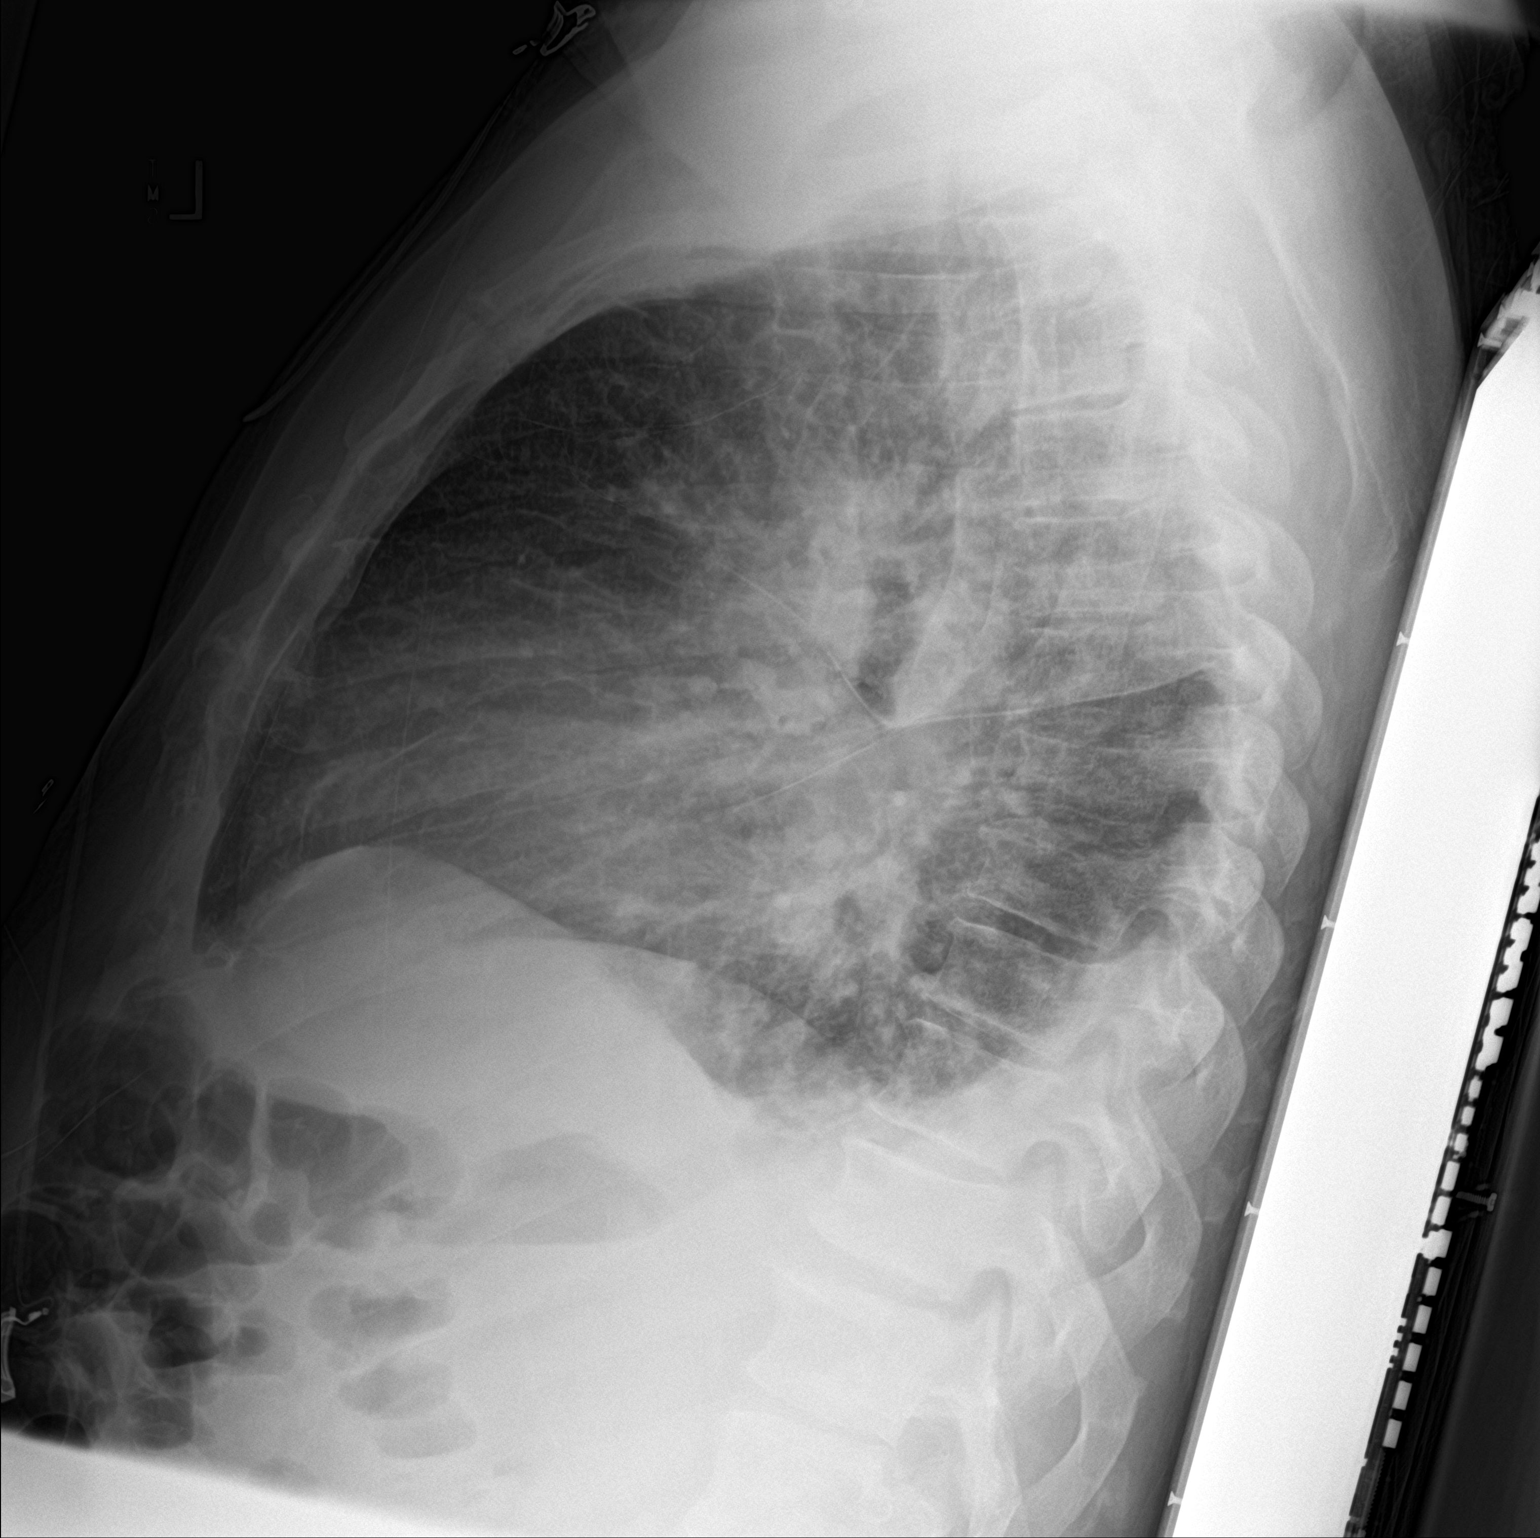

[chest ap]
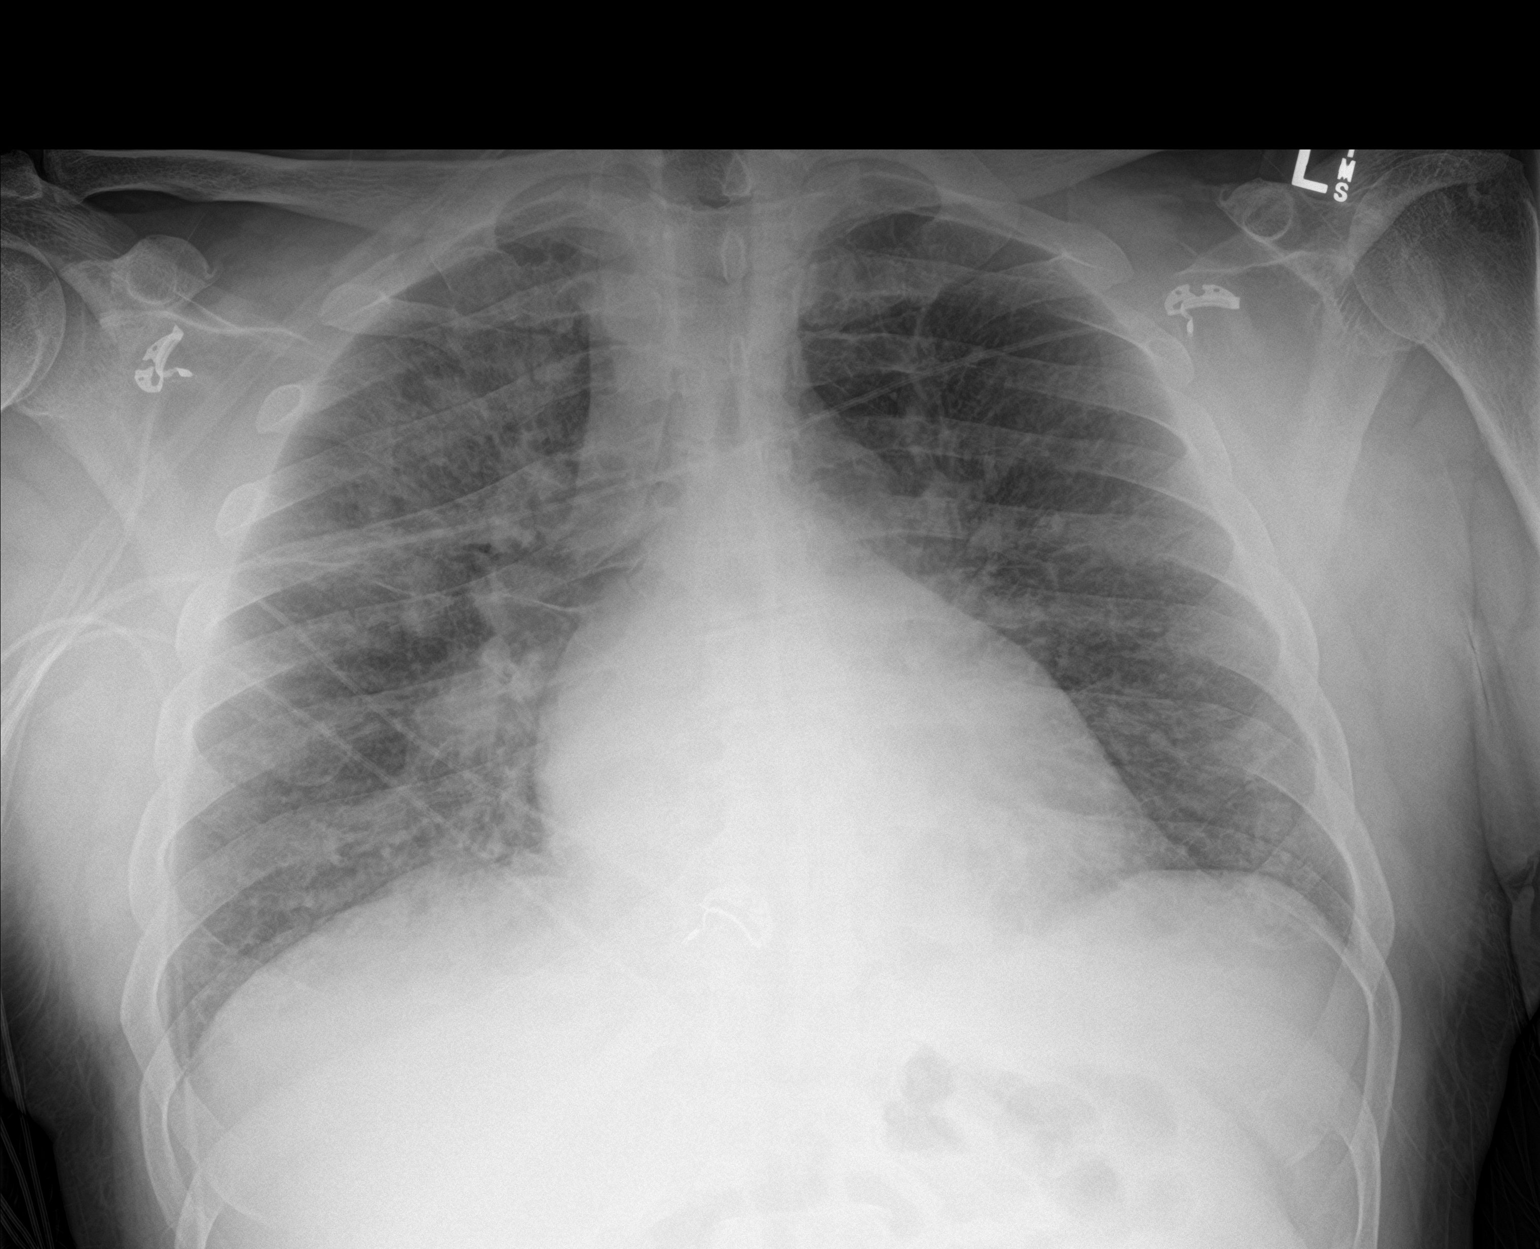

[2 of 2 positions shown; findings below may reference images not displayed]

FINDINGS: Heart is normal size. Prominence of the mediastinal and hilar
contours compatible with adenopathy as seen on prior CT. Patchy
bilateral airspace disease again noted. Small bilateral effusions.
IMPRESSION: Patchy bilateral airspace disease with small effusions, similar to
prior CT.

Mediastinal and bilateral hilar adenopathy, similar to prior study.

## 2023-07-06 ENCOUNTER — Ambulatory Visit (INDEPENDENT_AMBULATORY_CARE_PROVIDER_SITE_OTHER)

## 2023-07-06 ENCOUNTER — Other Ambulatory Visit: Payer: Self-pay

## 2023-07-06 VITALS — BP 102/68 | HR 74 | Temp 98.7°F | Resp 16 | Ht 68.0 in | Wt 204.4 lb

## 2023-07-06 DIAGNOSIS — D8685 Sarcoid myocarditis: Secondary | ICD-10-CM | POA: Diagnosis not present

## 2023-07-06 DIAGNOSIS — Z79899 Other long term (current) drug therapy: Secondary | ICD-10-CM

## 2023-07-06 DIAGNOSIS — D869 Sarcoidosis, unspecified: Secondary | ICD-10-CM

## 2023-07-06 MED ORDER — METHYLPREDNISOLONE SODIUM SUCC 40 MG IJ SOLR
40.0000 mg | Freq: Once | INTRAMUSCULAR | Status: AC
  Administered 2023-07-06: 40 mg via INTRAVENOUS
  Filled 2023-07-06: qty 1

## 2023-07-06 MED ORDER — DIPHENHYDRAMINE HCL 25 MG PO CAPS
25.0000 mg | ORAL_CAPSULE | Freq: Once | ORAL | Status: AC
  Administered 2023-07-06: 25 mg via ORAL
  Filled 2023-07-06: qty 1

## 2023-07-06 MED ORDER — SODIUM CHLORIDE 0.9 % IV SOLN
5.0000 mg/kg | Freq: Once | INTRAVENOUS | Status: AC
  Administered 2023-07-06: 500 mg via INTRAVENOUS
  Filled 2023-07-06: qty 50

## 2023-07-06 MED ORDER — ACETAMINOPHEN 325 MG PO TABS
650.0000 mg | ORAL_TABLET | Freq: Once | ORAL | Status: AC
  Administered 2023-07-06: 650 mg via ORAL
  Filled 2023-07-06: qty 2

## 2023-07-06 NOTE — Progress Notes (Signed)
 Diagnosis: Cardiac sarcoidosis   Provider:  Phyllis Breeze MD  Procedure: IV Infusion  IV Type: Peripheral, IV Location: R Antecubital  Avsola  (infliximab -axxq), Dose: 500 mg  Infusion Start Time: 1159  Infusion Stop Time: 1416  Post Infusion IV Care: Peripheral IV Discontinued  Discharge: Condition: Good, Destination: Home . AVS Declined  Performed by:  Lauran Pollard, LPN

## 2023-07-27 ENCOUNTER — Ambulatory Visit (INDEPENDENT_AMBULATORY_CARE_PROVIDER_SITE_OTHER): Payer: 59

## 2023-07-27 DIAGNOSIS — I4729 Other ventricular tachycardia: Secondary | ICD-10-CM | POA: Diagnosis not present

## 2023-07-27 LAB — CUP PACEART REMOTE DEVICE CHECK
Date Time Interrogation Session: 20250624100553
Implantable Lead Connection Status: 753985
Implantable Lead Implant Date: 20220926
Implantable Lead Location: 753860
Implantable Lead Model: 436910
Implantable Lead Serial Number: 81470915
Implantable Pulse Generator Implant Date: 20220926
Pulse Gen Model: 429525
Pulse Gen Serial Number: 84847461

## 2023-07-28 ENCOUNTER — Ambulatory Visit: Payer: Self-pay | Admitting: Cardiology

## 2023-08-19 ENCOUNTER — Encounter: Payer: Self-pay | Admitting: Pulmonary Disease

## 2023-08-19 ENCOUNTER — Ambulatory Visit (HOSPITAL_COMMUNITY)
Admission: RE | Admit: 2023-08-19 | Discharge: 2023-08-19 | Disposition: A | Discharge status: Home / Self Care | Source: Ambulatory Visit | Attending: Pulmonary Disease | Admitting: Pulmonary Disease

## 2023-08-19 DIAGNOSIS — R911 Solitary pulmonary nodule: Secondary | ICD-10-CM | POA: Diagnosis present

## 2023-08-20 DIAGNOSIS — G8929 Other chronic pain: Secondary | ICD-10-CM | POA: Diagnosis not present

## 2023-08-20 DIAGNOSIS — M25572 Pain in left ankle and joints of left foot: Secondary | ICD-10-CM | POA: Diagnosis not present

## 2023-08-20 DIAGNOSIS — M25472 Effusion, left ankle: Secondary | ICD-10-CM | POA: Diagnosis not present

## 2023-08-25 ENCOUNTER — Encounter: Payer: Self-pay | Admitting: Pulmonary Disease

## 2023-08-31 ENCOUNTER — Ambulatory Visit (HOSPITAL_BASED_OUTPATIENT_CLINIC_OR_DEPARTMENT_OTHER): Admitting: Primary Care

## 2023-08-31 ENCOUNTER — Ambulatory Visit (INDEPENDENT_AMBULATORY_CARE_PROVIDER_SITE_OTHER)

## 2023-08-31 VITALS — BP 111/70 | HR 60 | Temp 98.1°F | Resp 18 | Ht 68.0 in | Wt 199.0 lb

## 2023-08-31 DIAGNOSIS — Z79899 Other long term (current) drug therapy: Secondary | ICD-10-CM

## 2023-08-31 DIAGNOSIS — D8685 Sarcoid myocarditis: Secondary | ICD-10-CM | POA: Diagnosis not present

## 2023-08-31 DIAGNOSIS — D869 Sarcoidosis, unspecified: Secondary | ICD-10-CM

## 2023-08-31 MED ORDER — METHYLPREDNISOLONE SODIUM SUCC 40 MG IJ SOLR
40.0000 mg | Freq: Once | INTRAMUSCULAR | Status: AC
Start: 2023-08-31 — End: 2023-08-31
  Administered 2023-08-31: 40 mg via INTRAVENOUS
  Filled 2023-08-31: qty 1

## 2023-08-31 MED ORDER — SODIUM CHLORIDE 0.9 % IV SOLN
5.0000 mg/kg | Freq: Once | INTRAVENOUS | Status: AC
  Administered 2023-08-31: 500 mg via INTRAVENOUS
  Filled 2023-08-31: qty 50

## 2023-08-31 MED ORDER — ACETAMINOPHEN 325 MG PO TABS
650.0000 mg | ORAL_TABLET | Freq: Once | ORAL | Status: AC
Start: 2023-08-31 — End: 2023-08-31
  Administered 2023-08-31: 650 mg via ORAL
  Filled 2023-08-31: qty 2

## 2023-08-31 MED ORDER — DIPHENHYDRAMINE HCL 25 MG PO CAPS
25.0000 mg | ORAL_CAPSULE | Freq: Once | ORAL | Status: AC
Start: 2023-08-31 — End: 2023-08-31
  Administered 2023-08-31: 25 mg via ORAL
  Filled 2023-08-31: qty 1

## 2023-08-31 NOTE — Progress Notes (Signed)
 Diagnosis: Cardiac sarcoidosis   Provider:  Lonna Coder MD  Procedure: IV Infusion  IV Type: Peripheral, IV Location: R Antecubital  Avsola  (infliximab -axxq), Dose: 500 mg  Infusion Start Time: 1128  Infusion Stop Time: 1341  Post Infusion IV Care: Peripheral IV Discontinued  Discharge: Condition: Good, Destination: Home . AVS Declined  Performed by:  Dezi Schaner, RN

## 2023-09-02 ENCOUNTER — Ambulatory Visit: Payer: Self-pay | Admitting: Adult Health

## 2023-09-06 ENCOUNTER — Other Ambulatory Visit (HOSPITAL_COMMUNITY): Payer: Self-pay | Admitting: Family Medicine

## 2023-09-06 ENCOUNTER — Other Ambulatory Visit (HOSPITAL_BASED_OUTPATIENT_CLINIC_OR_DEPARTMENT_OTHER): Payer: Self-pay

## 2023-09-07 ENCOUNTER — Other Ambulatory Visit: Payer: Self-pay

## 2023-09-07 ENCOUNTER — Other Ambulatory Visit (HOSPITAL_COMMUNITY): Payer: Self-pay

## 2023-09-07 MED ORDER — LOSARTAN POTASSIUM 25 MG PO TABS
12.5000 mg | ORAL_TABLET | Freq: Every day | ORAL | 3 refills | Status: AC
  Filled 2023-09-07: qty 45, 90d supply, fill #0
  Filled 2024-03-02: qty 45, 90d supply, fill #1

## 2023-09-22 NOTE — Progress Notes (Signed)
 Thanks, I see him on Friday

## 2023-09-24 ENCOUNTER — Ambulatory Visit (HOSPITAL_BASED_OUTPATIENT_CLINIC_OR_DEPARTMENT_OTHER): Admitting: Primary Care

## 2023-09-24 ENCOUNTER — Encounter (HOSPITAL_BASED_OUTPATIENT_CLINIC_OR_DEPARTMENT_OTHER): Payer: Self-pay | Admitting: Primary Care

## 2023-09-24 VITALS — BP 122/75 | HR 67 | Ht 68.0 in | Wt 198.0 lb

## 2023-09-24 DIAGNOSIS — D869 Sarcoidosis, unspecified: Secondary | ICD-10-CM | POA: Diagnosis not present

## 2023-09-24 NOTE — Patient Instructions (Signed)
  VISIT SUMMARY: Nathan [name], a 58 year old male with sarcoidosis, came in for a follow-up after a recent CT scan. He has no new respiratory symptoms and is currently on methotrexate  and Avsola  infusions. The CT scan showed slight changes in his pulmonary nodules, we will repeat CT imaging in 3 months.   YOUR PLAN: -PULMONARY NODULES: Pulmonary nodules are small growths in the lungs. Your right lower lobe nodule has increased in size but flattened, and a new small nodule was found in your left lower lobe. These changes are not currently suspicious for cancer. We will repeat a chest CT scan in three months and perform blood tests today to monitor your condition.  -SARCOIDOSIS ON IMMUNOSUPPRESSIVE THERAPY: Sarcoidosis is an inflammatory disease that affects multiple organs, particularly the lungs and lymph glands. You are currently on methotrexate  and Avsola  infusions, which are helping to manage your condition. Continue with your current medications, and we will discuss your ongoing treatment plan at your next follow-up in three months.  INSTRUCTIONS: Please schedule a repeat chest CT scan in three months and complete the blood tests (CBC with differential and comprehensive metabolic panel) today. Continue with your current medications (methotrexate  20 mg weekly and Avsola  infusions every eight weeks). We will discuss your ongoing immunosuppression plan at your next follow-up appointment in three months.

## 2023-09-24 NOTE — Progress Notes (Signed)
 @Patient  ID: Nathan [name], male    DOB: Jun 05, 1965, 58 y.o.   MRN: [id number]  Chief Complaint  Patient presents with   Follow-up    Cardiac sarcoidosis    Referring provider: Burney Darice CROME, MD  HPI: 58 year old male, former smoker. PMH significant for sarcoidosis, mediastinal lymphadenopathy, CHF, vit D deficiency.    09/24/2023 Discussed the use of AI scribe software for clinical note transcription with the patient, who gave verbal consent to proceed. History of Present Illness Nathan [name] is a 58 year old male with sarcoidosis who presents for follow-up after a CT scan.  He has no new respiratory symptoms, including shortness of breath, chest pain, or cough. Initially, he experienced respiratory symptoms when his condition first occurred, for which he was treated with prednisone , but he is no longer on this medication.  A recent CT scan in July showed a previously noted right lower lobe pulmonary nodule that has increased in size but flattened, with a prior negative PET scan. A new six-millimeter nodule was identified in the left lower lobe.  He is currently on methotrexate  20 mg every Wednesday and receives Avsola  (infliximab ) infusions every eight weeks. No issues with the infusions.  He mentions experiencing some unintentional weight loss, which he attributes to being more active and eating less during the summer. No skin lesions or firm lumps on his skin or eyes. His sarcoidosis was initially discovered in his eyes.   No Known Allergies  Immunization History  Administered Date(s) Administered   Influenza, Seasonal, Injecte, Preservative Fre 11/11/2022   Influenza,inj,Quad PF,6+ Mos 10/15/2020   PFIZER(Purple Top)SARS-COV-2 Vaccination 07/06/2019, 07/27/2019, 02/15/2020   Pfizer Covid-19 Vaccine Bivalent Booster 56yrs & up 10/21/2020    Past Medical History:  Diagnosis Date   Allergies    09/16/2020   BPH (benign prostatic hyperplasia)    ED (erectile  dysfunction)    GERD (gastroesophageal reflux disease)    Osteoarthritis    Peripheral focal chorioretinal inflammation of both eyes    Retinal vasculitis of both eyes    Sarcoidosis     Tobacco History: Social History   Tobacco Use  Smoking Status Former   Current packs/day: 0.00   Types: Cigarettes   Quit date: 02/02/2010   Years since quitting: 13.6   Passive exposure: Never  Smokeless Tobacco Never   Counseling given: Not Answered   Outpatient Medications Prior to Visit  Medication Sig Dispense Refill   amiodarone  (PACERONE ) 200 MG tablet Take 1 tablet (200 mg total) by mouth daily at 12 noon. Monday-Friday only 30 tablet 6   Blood Pressure Monitoring (OMRON 3 SERIES BP MONITOR) DEVI Use as directed 1 each 0   carvedilol  (COREG ) 3.125 MG tablet Take 1 tablet (3.125 mg total) by mouth 2 (two) times daily. 60 tablet 11   cetirizine (ZYRTEC) 10 MG tablet Take 10 mg by mouth as needed.     dapagliflozin  propanediol (FARXIGA ) 10 MG TABS tablet Take 1 tablet (10 mg total) by mouth daily. 90 tablet 3   diclofenac  Sodium (VOLTAREN ) 1 % GEL Apply 2 g topically 4 (four) times daily as needed. 200 g 1   folic acid  (FOLVITE ) 1 MG tablet Take 2 tablets (2 mg total) by mouth daily. 60 tablet 11   furosemide  (LASIX ) 20 MG tablet Take 1 tablet (20 mg total) by mouth daily as needed. 30 tablet 3   inFLIXimab -axxq (AVSOLA  IV) Inject into the vein. 3mg /kg at Week 0, 2, and 6, then 3mg /kg  every 8 weeks thereafter     losartan  (COZAAR ) 25 MG tablet Take 0.5 tablets (12.5 mg total) by mouth daily. 45 tablet 3   Magnesium  Oxide 250 MG TABS take 1 tablet by mouth every day as needed 90 tablet 0   metFORMIN  (GLUCOPHAGE -XR) 500 MG 24 hr tablet Take 1 tablet (500 mg total) by mouth daily with evening meal 90 tablet 0   methotrexate  (RHEUMATREX) 2.5 MG tablet Take 8 tablets (20 mg total) by mouth as directed every Wednesday. Caution: Chemotherapy, protect from light. 96 tablet 2   Multiple Vitamin  (MULTIVITAMIN ADULT PO) Take by mouth.     omeprazole  (PRILOSEC) 40 MG capsule Take 1 capsule (40 mg total) by mouth every morning. 90 capsule 3   sildenafil  (REVATIO ) 20 MG tablet Take 2-3 tablets (40-60 mg total) by mouth as needed. 50 tablet 0   spironolactone  (ALDACTONE ) 25 MG tablet Take 12.5 mg by mouth daily.     tamsulosin  (FLOMAX ) 0.4 MG CAPS capsule Take 1 capsule (0.4 mg total) by mouth at bedtime. 90 capsule 0   No facility-administered medications prior to visit.   Review of Systems  Review of Systems  Respiratory: Negative.    Cardiovascular: Negative.    Physical Exam  BP 122/75   Pulse 67   Ht 5' 8 (1.727 m)   Wt 198 lb (89.8 kg)   SpO2 100%   BMI 30.11 kg/m  Physical Exam Constitutional:      General: He is not in acute distress.    Appearance: Normal appearance. He is not ill-appearing.  HENT:     Head: Normocephalic and atraumatic.  Cardiovascular:     Rate and Rhythm: Normal rate and regular rhythm.  Pulmonary:     Effort: Pulmonary effort is normal.     Breath sounds: Normal breath sounds.  Musculoskeletal:        General: Normal range of motion.  Skin:    General: Skin is warm and dry.  Neurological:     General: No focal deficit present.     Mental Status: He is alert and oriented to person, place, and time. Mental status is at baseline.  Psychiatric:        Mood and Affect: Mood normal.        Behavior: Behavior normal.        Thought Content: Thought content normal.        Judgment: Judgment normal.     Lab Results:  CBC    Component Value Date/Time   WBC 4.1 05/25/2023 0946   WBC 6.5 11/09/2022 1103   RBC 4.40 05/25/2023 0946   RBC 4.51 11/09/2022 1103   HGB 13.3 05/25/2023 0946   HCT 40.0 05/25/2023 0946   PLT 274 05/25/2023 0946   MCV 91 05/25/2023 0946   MCH 30.2 05/25/2023 0946   MCH 28.4 10/10/2021 1247   MCHC 33.3 05/25/2023 0946   MCHC 31.1 11/09/2022 1103   RDW 15.3 05/25/2023 0946   LYMPHSABS 1.2 05/25/2023 0946    MONOABS 0.6 11/09/2022 1103   EOSABS 0.1 05/25/2023 0946   BASOSABS 0.0 05/25/2023 0946    BMET    Component Value Date/Time   NA 140 05/25/2023 0946   K 4.6 05/25/2023 0946   CL 104 05/25/2023 0946   CO2 23 05/25/2023 0946   GLUCOSE 94 05/25/2023 0946   GLUCOSE 86 11/09/2022 1103   BUN 12 05/25/2023 0946   CREATININE 1.41 (H) 05/25/2023 0946   CALCIUM  9.4 05/25/2023 0946  CALCIUM  9.8 05/12/2023 0000   GFRNONAA 43 (L) 01/22/2022 1359   GFRAA >60 12/05/2015 1215    BNP    Component Value Date/Time   BNP 109.8 (H) 01/22/2022 1400    ProBNP No results found for: PROBNP  Imaging: No results found.   Assessment & Plan:   1. Sarcoidosis (Primary) - CT CHEST WO CONTRAST; Future - CBC w/Diff - Comp Met (CMET)  Assessment and Plan Assessment & Plan Pulmonary nodules (right lower lobe and new left lower lobe nodule) The right lower lobe pulmonary nodule has increased in size but flattened, with a prior negative PET scan. A new 6 mm nodule is present in the left lower lobe. No new adenopathy.  - Order repeat chest CT scan in three months  Sarcoidosis on immunosuppressive therapy Currently on methotrexate  20 mg weekly and Avsola  (infliximab ) infusions every eight weeks. No issues reported with the infusions. Previously on prednisone  but has since discontinued. No new respiratory symptoms or skin lesions. Sarcoidosis initially presented in the eyes, and regular eye examinations are maintained. - Continue methotrexate  20 mg weekly - Continue Avsola  infusions every eight weeks - Perform CBC with differential and comprehensive metabolic panel today - Discuss ongoing immunosuppression plan at next follow-up in three months   Almarie LELON Ferrari, NP 09/24/2023

## 2023-09-25 LAB — CBC WITH DIFFERENTIAL/PLATELET
Basophils Absolute: 0 x10E3/uL (ref 0.0–0.2)
Basos: 1 %
EOS (ABSOLUTE): 0.1 x10E3/uL (ref 0.0–0.4)
Eos: 2 %
Hematocrit: 41.6 % (ref 37.5–51.0)
Hemoglobin: 13.2 g/dL (ref 13.0–17.7)
Immature Grans (Abs): 0 x10E3/uL (ref 0.0–0.1)
Immature Granulocytes: 0 %
Lymphocytes Absolute: 1.1 x10E3/uL (ref 0.7–3.1)
Lymphs: 27 %
MCH: 30.6 pg (ref 26.6–33.0)
MCHC: 31.7 g/dL (ref 31.5–35.7)
MCV: 96 fL (ref 79–97)
Monocytes Absolute: 0.8 x10E3/uL (ref 0.1–0.9)
Monocytes: 18 %
Neutrophils Absolute: 2.2 x10E3/uL (ref 1.4–7.0)
Neutrophils: 52 %
Platelets: 293 x10E3/uL (ref 150–450)
RBC: 4.32 x10E6/uL (ref 4.14–5.80)
RDW: 16.3 % — ABNORMAL HIGH (ref 11.6–15.4)
WBC: 4.2 x10E3/uL (ref 3.4–10.8)

## 2023-09-25 LAB — COMPREHENSIVE METABOLIC PANEL WITH GFR
ALT: 12 IU/L (ref 0–44)
AST: 20 IU/L (ref 0–40)
Albumin: 4.2 g/dL (ref 3.8–4.9)
Alkaline Phosphatase: 62 IU/L (ref 44–121)
BUN/Creatinine Ratio: 10 (ref 9–20)
BUN: 16 mg/dL (ref 6–24)
Bilirubin Total: 0.5 mg/dL (ref 0.0–1.2)
CO2: 23 mmol/L (ref 20–29)
Calcium: 9.3 mg/dL (ref 8.7–10.2)
Chloride: 109 mmol/L — ABNORMAL HIGH (ref 96–106)
Creatinine, Ser: 1.53 mg/dL — ABNORMAL HIGH (ref 0.76–1.27)
Globulin, Total: 2.3 g/dL (ref 1.5–4.5)
Glucose: 46 mg/dL — ABNORMAL LOW (ref 70–99)
Potassium: 4.3 mmol/L (ref 3.5–5.2)
Sodium: 147 mmol/L — ABNORMAL HIGH (ref 134–144)
Total Protein: 6.5 g/dL (ref 6.0–8.5)
eGFR: 53 mL/min/1.73 — ABNORMAL LOW (ref >59)

## 2023-10-05 ENCOUNTER — Telehealth: Payer: Self-pay

## 2023-10-05 NOTE — Telephone Encounter (Signed)
No messages received for at least 21 days Last message received 21 days ago. The patient will be deactivated in 68 days.

## 2023-10-05 NOTE — Telephone Encounter (Signed)
 Spoke with pt he has not been beside monitor if a few days but states he will make sure its plugged in and near where he sleeps. I have did a forced transmission and will check back to make sure its connected

## 2023-10-06 NOTE — Telephone Encounter (Signed)
 Per review of [employer] website:  Pt is reconnected and quick transmission was successful.  No further action needed.

## 2023-10-26 ENCOUNTER — Ambulatory Visit (INDEPENDENT_AMBULATORY_CARE_PROVIDER_SITE_OTHER)

## 2023-10-26 ENCOUNTER — Ambulatory Visit: Payer: 59

## 2023-10-26 VITALS — BP 119/75 | HR 58 | Temp 97.5°F | Resp 18 | Ht 68.0 in | Wt 195.6 lb

## 2023-10-26 DIAGNOSIS — D869 Sarcoidosis, unspecified: Secondary | ICD-10-CM

## 2023-10-26 DIAGNOSIS — D8685 Sarcoid myocarditis: Secondary | ICD-10-CM

## 2023-10-26 DIAGNOSIS — Z79899 Other long term (current) drug therapy: Secondary | ICD-10-CM | POA: Diagnosis not present

## 2023-10-26 DIAGNOSIS — I4729 Other ventricular tachycardia: Secondary | ICD-10-CM

## 2023-10-26 MED ORDER — METHYLPREDNISOLONE SODIUM SUCC 40 MG IJ SOLR
40.0000 mg | Freq: Once | INTRAMUSCULAR | Status: AC
  Administered 2023-10-26: 40 mg via INTRAVENOUS
  Filled 2023-10-26: qty 1

## 2023-10-26 MED ORDER — SODIUM CHLORIDE 0.9 % IV SOLN
5.0000 mg/kg | Freq: Once | INTRAVENOUS | Status: AC
  Administered 2023-10-26: 400 mg via INTRAVENOUS
  Filled 2023-10-26: qty 40

## 2023-10-26 MED ORDER — ACETAMINOPHEN 325 MG PO TABS
650.0000 mg | ORAL_TABLET | Freq: Once | ORAL | Status: AC
  Administered 2023-10-26: 650 mg via ORAL
  Filled 2023-10-26: qty 2

## 2023-10-26 MED ORDER — DIPHENHYDRAMINE HCL 25 MG PO CAPS
25.0000 mg | ORAL_CAPSULE | Freq: Once | ORAL | Status: AC
  Administered 2023-10-26: 25 mg via ORAL
  Filled 2023-10-26: qty 1

## 2023-10-26 NOTE — Progress Notes (Signed)
 Diagnosis: Cardiac sarcoidosis   Provider:  Lonna Coder MD  Procedure: IV Infusion  IV Type: Peripheral, IV Location: L Antecubital  Avsola  (infliximab -axxq), Dose: 400 mg  Infusion Start Time: 1142  Infusion Stop Time: 1400  Post Infusion IV Care: Peripheral IV Discontinued  Discharge: Condition: Good, Destination: Home . AVS Declined  Performed by:  Maximiano JONELLE Pouch, LPN

## 2023-10-27 NOTE — Progress Notes (Signed)
Remote ICD Transmission.

## 2023-10-28 LAB — CUP PACEART REMOTE DEVICE CHECK
Date Time Interrogation Session: 20250925111957
Implantable Lead Connection Status: 753985
Implantable Lead Implant Date: 20220926
Implantable Lead Location: 753860
Implantable Lead Model: 436910
Implantable Lead Serial Number: 81470915
Implantable Pulse Generator Implant Date: 20220926
Pulse Gen Model: 429525
Pulse Gen Serial Number: 84847461

## 2023-10-29 NOTE — Progress Notes (Signed)
Remote ICD Transmission.

## 2023-11-04 ENCOUNTER — Ambulatory Visit: Payer: Self-pay | Admitting: Cardiology

## 2023-11-12 ENCOUNTER — Encounter (HOSPITAL_BASED_OUTPATIENT_CLINIC_OR_DEPARTMENT_OTHER): Payer: Self-pay | Admitting: Family Medicine

## 2023-11-12 ENCOUNTER — Ambulatory Visit (INDEPENDENT_AMBULATORY_CARE_PROVIDER_SITE_OTHER): Admitting: Family Medicine

## 2023-11-12 ENCOUNTER — Encounter (HOSPITAL_BASED_OUTPATIENT_CLINIC_OR_DEPARTMENT_OTHER): Payer: Self-pay

## 2023-11-12 VITALS — BP 112/69 | HR 77 | Ht 68.0 in | Wt 190.8 lb

## 2023-11-12 DIAGNOSIS — Z125 Encounter for screening for malignant neoplasm of prostate: Secondary | ICD-10-CM

## 2023-11-12 DIAGNOSIS — Z23 Encounter for immunization: Secondary | ICD-10-CM

## 2023-11-12 DIAGNOSIS — E559 Vitamin D deficiency, unspecified: Secondary | ICD-10-CM

## 2023-11-12 DIAGNOSIS — K4090 Unilateral inguinal hernia, without obstruction or gangrene, not specified as recurrent: Secondary | ICD-10-CM | POA: Diagnosis not present

## 2023-11-12 DIAGNOSIS — Z0000 Encounter for general adult medical examination without abnormal findings: Secondary | ICD-10-CM

## 2023-11-12 MED ORDER — SILDENAFIL CITRATE 20 MG PO TABS: 40.0000 mg | ORAL_TABLET | ORAL | 0 refills | Status: DC | PRN

## 2023-11-12 MED ORDER — TAMSULOSIN HCL 0.4 MG PO CAPS: 0.4000 mg | ORAL_CAPSULE | Freq: Every day | ORAL | 1 refills | Status: AC

## 2023-11-12 NOTE — Progress Notes (Signed)
 New Patient Office Visit  Subjective   Patient ID: Nathan [name], male    DOB: [birth year]  Age: 58 y.o. MRN: [id number]  CC:  Chief Complaint  Patient presents with   New Patient (Initial Visit)    New Patient - was seeing karen rictor patient has bulge in groin area on right side has been there for 2 months     HPI Nathan [name] presents to establish care Last PCP - Darice Henle  Bulge in right groin/inguinal region. Present for a couple months, bigger at times. No changes in bowel movements. No prior issues with this. Thinks if he coughs or picks up something heavy bulge will become bigger  Sarcoid: follows with cardiology, rheumatology and pulmonology due to sarcoidosis.  He has history of mediastinal lymphadenopathy, cardiac sarcoidosis with CHF.  He is currently managed on methotrexate  and infliximab  infusions. Recent CT scan with pulmonology did show new 6 mm nodule.  Recommendation is to have repeat CT scan in few months.  Patient also takes Flomax  to help with BPH with urinary symptoms.  He has been out of medication recently as his last PCP would not refill without office visit.  Requesting refill today.  Patient is originally from [place]. 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 He enjoys singing and playing golf.  Outpatient Encounter Medications as of 11/12/2023  Medication Sig   amiodarone  (PACERONE ) 200 MG tablet Take 1 tablet (200 mg total) by mouth daily at 12 noon. Monday-Friday only   carvedilol  (COREG ) 3.125 MG tablet Take 1 tablet (3.125 mg total) by mouth 2 (two) times daily.   cetirizine (ZYRTEC) 10 MG tablet Take 10 mg by mouth as needed.   dapagliflozin  propanediol (FARXIGA ) 10 MG TABS tablet Take 1 tablet (10 mg total) by mouth daily.   diclofenac  Sodium (VOLTAREN ) 1 % GEL Apply 2 g topically 4 (four) times daily as needed.   folic acid  (FOLVITE ) 1 MG tablet Take 2 tablets (2 mg total) by mouth daily.   inFLIXimab -axxq (AVSOLA   IV) Inject into the vein. 3mg /kg at Week 0, 2, and 6, then 3mg /kg every 8 weeks thereafter   losartan  (COZAAR ) 25 MG tablet Take 0.5 tablets (12.5 mg total) by mouth daily.   methotrexate  (RHEUMATREX) 2.5 MG tablet Take 8 tablets (20 mg total) by mouth as directed every Wednesday. Caution: Chemotherapy, protect from light.   Multiple Vitamin (MULTIVITAMIN ADULT PO) Take by mouth.   omeprazole  (PRILOSEC) 40 MG capsule Take 1 capsule (40 mg total) by mouth every morning.   spironolactone  (ALDACTONE ) 25 MG tablet Take 12.5 mg by mouth daily.   [DISCONTINUED] sildenafil  (REVATIO ) 20 MG tablet Take 2-3 tablets (40-60 mg total) by mouth as needed.   furosemide  (LASIX ) 20 MG tablet Take 1 tablet (20 mg total) by mouth daily as needed. (Patient not taking: Reported on 11/12/2023)   Magnesium  Oxide 250 MG TABS take 1 tablet by mouth every day as needed (Patient not taking: Reported on 11/12/2023)   metFORMIN  (GLUCOPHAGE -XR) 500 MG 24 hr tablet Take 1 tablet (500 mg total) by mouth daily with evening meal (Patient not taking: Reported on 11/12/2023)   sildenafil  (REVATIO ) 20 MG tablet Take 2-3 tablets (40-60 mg total) by mouth as needed.   tamsulosin  (FLOMAX ) 0.4 MG CAPS capsule Take 1 capsule (0.4 mg total) by mouth at bedtime.   [DISCONTINUED] Blood Pressure Monitoring (OMRON 3 SERIES BP MONITOR) DEVI Use as directed   [DISCONTINUED] tamsulosin  (FLOMAX ) 0.4  MG CAPS capsule Take 1 capsule (0.4 mg total) by mouth at bedtime. (Patient not taking: Reported on 11/12/2023)   No facility-administered encounter medications on file as of 11/12/2023.    Past Medical History:  Diagnosis Date   Allergies    09/16/2020   BPH (benign prostatic hyperplasia)    ED (erectile dysfunction)    GERD (gastroesophageal reflux disease)    Osteoarthritis    Peripheral focal chorioretinal inflammation of both eyes    Retinal vasculitis of both eyes    Sarcoidosis     Past Surgical History:  Procedure Laterality Date    BRONCHIAL NEEDLE ASPIRATION BIOPSY  10/18/2020   Procedure: BRONCHIAL NEEDLE ASPIRATION BIOPSIES;  Surgeon: Brenna Adine CROME, DO;  Location: [facility];  Service: Pulmonary;;   BRONCHIAL WASHINGS  10/18/2020   Procedure: BRONCHIAL WASHINGS;  Surgeon: Brenna Adine CROME, DO;  Location: [facility] ENDOSCOPY;  Service: Pulmonary;;   ICD IMPLANT N/A 10/28/2020   Procedure: ICD IMPLANT;  Surgeon: Cindie Ole DASEN, MD;  Location: [facility] INVASIVE CV LAB;  Service: Cardiovascular;  Laterality: N/A;   RIGHT/LEFT HEART CATH AND CORONARY ANGIOGRAPHY N/A 10/16/2020   Procedure: RIGHT/LEFT HEART CATH AND CORONARY ANGIOGRAPHY;  Surgeon: Anner Alm ORN, MD;  Location: [facility] INVASIVE CV LAB;  Service: Cardiovascular;  Laterality: N/A;   VIDEO BRONCHOSCOPY WITH ENDOBRONCHIAL ULTRASOUND Bilateral 10/18/2020   Procedure: VIDEO BRONCHOSCOPY WITH ENDOBRONCHIAL ULTRASOUND;  Surgeon: Brenna Adine CROME, DO;  Location: [facility] ENDOSCOPY;  Service: Pulmonary;  Laterality: Bilateral;    Family History  Problem Relation Age of Onset   Stroke Mother    Chronic Renal Failure Mother    Dementia Mother    Cancer Father 17   Diabetes Father    Pancreatic cancer Father     Social History   Socioeconomic History   Marital status: Married    Spouse name: Not on file   Number of children: Not on file   Years of education: Not on file   Highest education level: Not on file  Occupational History   Not on file  Tobacco Use   Smoking status: Former    Current packs/day: 0.00    Types: Cigarettes    Quit date: 02/02/2010    Years since quitting: 13.7    Passive exposure: Past   Smokeless tobacco: Never  Vaping Use   Vaping status: Never Used  Substance and Sexual Activity   Alcohol use: Yes    Alcohol/week: 1.0 standard drink of alcohol    Types: 1 Cans of beer per week   Drug use: Not Currently   Sexual activity: Not on file  Other Topics Concern   Not on file  Social History Narrative   Not on file   Social Drivers of  Health   Financial Resource Strain: Low Risk  (10/21/2020)   Overall Financial Resource Strain (CARDIA)    Difficulty of Paying Living Expenses: Not very hard  Food Insecurity: No Food Insecurity (10/21/2020)   Hunger Vital Sign    Worried About Running Out of Food in the Last Year: Never true    Ran Out of Food in the Last Year: Never true  Transportation Needs: No Transportation Needs (10/21/2020)   PRAPARE - Administrator, Civil Service (Medical): No    Lack of Transportation (Non-Medical): No  Physical Activity: Not on file  Stress: Not on file  Social Connections: Not on file  Intimate Partner Violence: Not on file    Objective   BP 112/69 (BP Location:  Right Arm, Patient Position: Sitting, Cuff Size: Normal)   Pulse 77   Ht 5' 8 (1.727 m)   Wt 190 lb 12.8 oz (86.5 kg)   SpO2 99%   BMI 29.01 kg/m   Physical Exam  58 year old male in no acute distress Cardiovascular exam with regular rate and rhythm Lungs clear to auscultation bilaterally Abdomen with normal bowel sounds, soft, nontender, nondistended.  He does have some increased fullness within right inguinal region.  Assessment & Plan:   Non-recurrent unilateral inguinal hernia without obstruction or gangrene Assessment & Plan: Based on history and exam, concern for underlying inguinal hernia as cause of symptoms.  We discussed considerations and he would like to proceed with referral to surgeon for further evaluation.  Feel that this is reasonable, referral placed today  Orders: -     Ambulatory referral to General Surgery  Encounter for immunization -     Flu vaccine trivalent PF, 6mos and older(Flulaval,Afluria,Fluarix,Fluzone)  Vitamin D  deficiency Assessment & Plan: Noted in the past, we can plan to recheck vitamin D  levels with labs before physical.  Last check of vitamin D  level showed that it was within normal range, however was at lower end of normal range  Orders: -     VITAMIN D  25  Hydroxy (Vit-D Deficiency, Fractures); Future  Wellness examination -     CBC with Differential/Platelet; Future -     Comprehensive metabolic panel with GFR; Future -     Hemoglobin A1c; Future -     Lipid panel; Future -     TSH Rfx on Abnormal to Free T4; Future -     VITAMIN D  25 Hydroxy (Vit-D Deficiency, Fractures); Future -     PSA Total (Reflex To Free); Future  Prostate cancer screening -     PSA Total (Reflex To Free); Future  Other orders -     Tamsulosin  HCl; Take 1 capsule (0.4 mg total) by mouth at bedtime.  Dispense: 90 capsule; Refill: 1 -     Sildenafil  Citrate; Take 2-3 tablets (40-60 mg total) by mouth as needed.  Dispense: 50 tablet; Refill: 0  Return in about 6 months (around 05/12/2024) for CPE with fasting labs 1 week prior.    ___________________________________________ Marybell Robards de [place], MD, ABFM, CAQSM Primary Care and Sports Medicine [facility]

## 2023-11-12 NOTE — Assessment & Plan Note (Signed)
 Noted in the past, we can plan to recheck vitamin D  levels with labs before physical.  Last check of vitamin D  level showed that it was within normal range, however was at lower end of normal range

## 2023-11-12 NOTE — Assessment & Plan Note (Signed)
 Based on history and exam, concern for underlying inguinal hernia as cause of symptoms.  We discussed considerations and he would like to proceed with referral to surgeon for further evaluation.  Feel that this is reasonable, referral placed today

## 2023-11-12 NOTE — Patient Instructions (Signed)
  Medication Instructions:  Your physician recommends that you continue on your current medications as directed. Please refer to the Current Medication list given to you today. --If you need a refill on any your medications before your next appointment, please call your pharmacy first. If no refills are authorized on file call the office.-- Lab Work: Your physician has recommended that you have lab work today: 1 week before next visit  If you have labs (blood work) drawn today and your tests are completely normal, you will receive your results via MyChart message OR a phone call from our staff.  Please ensure you check your voicemail in the event that you authorized detailed messages to be left on a delegated number. If you have any lab test that is abnormal or we need to change your treatment, we will call you to review the results.  Referrals/Procedures/Imaging: General Surgery  Follow-Up: Your next appointment:   Your physician recommends that you schedule a follow-up appointment in: 4-6 month physical  with Dr. de [place]  You will receive a text message or e-mail with a link to a survey about your care and experience with us  today! We would greatly appreciate your feedback!   Thanks for letting us  be apart of your health journey!!  Primary Care and Sports Medicine   Dr. Quintin sheerer Peru   We encourage you to activate your patient portal called MyChart.  Sign up information is provided on this After Visit Summary.  MyChart is used to connect with patients for Virtual Visits (Telemedicine).  Patients are able to view lab/test results, encounter notes, upcoming appointments, etc.  Non-urgent messages can be sent to your provider as well. To learn more about what you can do with MyChart, please visit --  [email or web address].

## 2023-11-24 ENCOUNTER — Encounter: Payer: Self-pay | Admitting: Pulmonary Disease

## 2023-11-24 ENCOUNTER — Ambulatory Visit
Admission: RE | Admit: 2023-11-24 | Discharge: 2023-11-24 | Disposition: A | Discharge status: Home / Self Care | Source: Ambulatory Visit | Attending: Primary Care | Admitting: Primary Care

## 2023-11-24 DIAGNOSIS — D869 Sarcoidosis, unspecified: Secondary | ICD-10-CM

## 2023-11-24 DIAGNOSIS — R918 Other nonspecific abnormal finding of lung field: Secondary | ICD-10-CM | POA: Diagnosis not present

## 2023-11-27 DIAGNOSIS — S0990XA Unspecified injury of head, initial encounter: Secondary | ICD-10-CM | POA: Diagnosis not present

## 2023-11-27 DIAGNOSIS — W01190A Fall on same level from slipping, tripping and stumbling with subsequent striking against furniture, initial encounter: Secondary | ICD-10-CM | POA: Diagnosis not present

## 2023-11-29 ENCOUNTER — Telehealth (HOSPITAL_BASED_OUTPATIENT_CLINIC_OR_DEPARTMENT_OTHER): Payer: Self-pay

## 2023-11-29 ENCOUNTER — Ambulatory Visit: Payer: Self-pay | Admitting: General Surgery

## 2023-11-29 DIAGNOSIS — K4090 Unilateral inguinal hernia, without obstruction or gangrene, not specified as recurrent: Secondary | ICD-10-CM | POA: Diagnosis not present

## 2023-11-29 NOTE — Telephone Encounter (Signed)
   Pre-operative Risk Assessment    Patient Name: Nathan [name]  DOB: 1965-05-02 MRN: [id number]   Date of last office visit: 09/03/22 with Dr. Cindie Date of next office visit: NA  Request for Surgical Clearance    Procedure:  Inguinal Hernia surgery  Date of Surgery:  Clearance TBD                                Surgeon:  Dr. Carlean Idler Surgeon's Group or Practice Name:  [facility] Phone number:  [phone] Fax number:  [phone]   Type of Clearance Requested:   - Medical    Type of Anesthesia:  General    Additional requests/questions:  ICD BIO CL  Signed, Augustin JONETTA Daring   11/29/2023, 9:17 AM

## 2023-11-29 NOTE — Telephone Encounter (Signed)
 I will send a message to the Heart Failure clinic to reach out to the pt with an appt per preop APP Lum Louis, NP.

## 2023-11-29 NOTE — Telephone Encounter (Signed)
   Name: Nathan [name]  DOB: 1965-11-24  MRN: [id number]  Primary Cardiologist: Annabella Scarce, MD  Chart reviewed as part of pre-operative protocol coverage. Because of [name]'s past medical history and time since last visit, he will require a follow-up in-office visit in order to better assess preoperative cardiovascular risk.  Patient needs an in-office visit with advanced heart failure clinic who he is established with.   Pre-op covering staff: - Please schedule appointment and call patient to inform them. If patient already had an upcoming appointment within acceptable timeframe, please add pre-op clearance to the appointment notes so provider is aware. - Please contact requesting surgeon's office via preferred method (i.e, phone, fax) to inform them of need for appointment prior to surgery.   Lum LITTIE Louis, NP  11/29/2023, 11:42 AM

## 2023-11-30 NOTE — Telephone Encounter (Signed)
 [name], Nathan [name], [place]; Nathan [name] CLE[place] Nathan, [name], CMA Lvm to call back to sch appt w/AHF

## 2023-12-02 NOTE — Telephone Encounter (Signed)
 Will update the requesting office to see notes pt needs appt with Heart Failure Clinic. Heart Failure clinic has tried to reach out to the pt to schedule an appt.   Pt needs to call the Heart Failure clinic to schedule appt with DR. Bensimhon or APP for preop clearance.

## 2023-12-06 ENCOUNTER — Encounter: Payer: Self-pay | Admitting: Radiology

## 2023-12-06 NOTE — Telephone Encounter (Signed)
 Pt needs to call the Heart Failure clinic to schedule appt with Dr. Rosalynd or APP. HF clinic has tried to reach the pt.

## 2023-12-07 ENCOUNTER — Encounter: Payer: Self-pay | Admitting: Pulmonary Disease

## 2023-12-07 ENCOUNTER — Encounter (HOSPITAL_BASED_OUTPATIENT_CLINIC_OR_DEPARTMENT_OTHER): Payer: Self-pay

## 2023-12-07 DIAGNOSIS — N529 Male erectile dysfunction, unspecified: Secondary | ICD-10-CM | POA: Diagnosis not present

## 2023-12-07 DIAGNOSIS — N401 Enlarged prostate with lower urinary tract symptoms: Secondary | ICD-10-CM | POA: Diagnosis not present

## 2023-12-07 DIAGNOSIS — N138 Other obstructive and reflux uropathy: Secondary | ICD-10-CM | POA: Diagnosis not present

## 2023-12-07 DIAGNOSIS — Z125 Encounter for screening for malignant neoplasm of prostate: Secondary | ICD-10-CM | POA: Diagnosis not present

## 2023-12-14 ENCOUNTER — Encounter: Payer: Self-pay | Admitting: Pulmonary Disease

## 2023-12-14 ENCOUNTER — Ambulatory Visit: Attending: Student | Admitting: Student

## 2023-12-14 ENCOUNTER — Encounter: Payer: Self-pay | Admitting: Student

## 2023-12-14 ENCOUNTER — Other Ambulatory Visit (HOSPITAL_BASED_OUTPATIENT_CLINIC_OR_DEPARTMENT_OTHER): Payer: Self-pay | Admitting: *Deleted

## 2023-12-14 VITALS — BP 124/70 | HR 85 | Ht 68.0 in | Wt 191.4 lb

## 2023-12-14 DIAGNOSIS — D8685 Sarcoid myocarditis: Secondary | ICD-10-CM

## 2023-12-14 DIAGNOSIS — Z95810 Presence of automatic (implantable) cardiac defibrillator: Secondary | ICD-10-CM

## 2023-12-14 DIAGNOSIS — Z79899 Other long term (current) drug therapy: Secondary | ICD-10-CM

## 2023-12-14 DIAGNOSIS — I4729 Other ventricular tachycardia: Secondary | ICD-10-CM | POA: Diagnosis not present

## 2023-12-14 DIAGNOSIS — I5022 Chronic systolic (congestive) heart failure: Secondary | ICD-10-CM

## 2023-12-14 DIAGNOSIS — Z9189 Other specified personal risk factors, not elsewhere classified: Secondary | ICD-10-CM | POA: Diagnosis not present

## 2023-12-14 LAB — CUP PACEART INCLINIC DEVICE CHECK
Battery Remaining Percentage: 100 %
Battery Voltage: 3.11 V
Brady Statistic AS VP Percent: 0 %
Brady Statistic AS VS Percent: 100 %
Brady Statistic RV Percent Paced: 0 %
Date Time Interrogation Session: 20251111091335
HighPow Impedance: 59 Ohm
Implantable Lead Connection Status: 753985
Implantable Lead Implant Date: 20220926
Implantable Lead Location: 753860
Implantable Lead Model: 436910
Implantable Lead Serial Number: 81470915
Implantable Pulse Generator Implant Date: 20220926
Lead Channel Impedance Value: 462 Ohm
Lead Channel Pacing Threshold Amplitude: 1.3 V
Lead Channel Pacing Threshold Pulse Width: 0.4 ms
Lead Channel Setting Pacing Amplitude: 2.5 V
Lead Channel Setting Pacing Pulse Width: 0.4 ms
Lead Channel Setting Sensing Sensitivity: 0.8 mV
Pulse Gen Model: 429525
Pulse Gen Serial Number: 84847461
Zone Setting Status: 755011

## 2023-12-14 NOTE — Patient Instructions (Signed)
 Medication Instructions:  No medication changes today. *If you need a refill on your cardiac medications before your next appointment, please call your pharmacy*  Lab Work: CMET, TSH, and Free T4 today. If you have labs (blood work) drawn today and your tests are completely normal, you will receive your results only by: MyChart Message (if you have MyChart) OR A paper copy in the mail If you have any lab test that is abnormal or we need to change your treatment, we will call you to review the results.  Testing/Procedures: No testing ordered today  Follow-Up: At [facility], you and your health needs are our priority.  As part of our continuing mission to provide you with exceptional heart care, our providers are all part of one team.  This team includes your primary Cardiologist (physician) and Advanced Practice Providers or APPs (Physician Assistants and Nurse Practitioners) who all work together to provide you with the care you need, when you need it.  Your next appointment:   6 month(s)  Provider:   You may see Donnice DELENA Primus, MD or one of the following Advanced Practice Providers on your designated Care Team:   Charlies Arthur, PA-C Nolen Lindamood Andy Anvith Mauriello, PA-C Suzann Riddle, NP Daphne Barrack, NP    We recommend signing up for the patient portal called MyChart.  Sign up information is provided on this After Visit Summary.  MyChart is used to connect with patients for Virtual Visits (Telemedicine).  Patients are able to view lab/test results, encounter notes, upcoming appointments, etc.  Non-urgent messages can be sent to your provider as well.   To learn more about what you can do with MyChart, go to [email or web address].

## 2023-12-14 NOTE — Progress Notes (Signed)
  Electrophysiology Office Note:   ID:  Nathan [name], DOB Aug 09, 1965, MRN [id number]  Primary Cardiologist: Annabella Scarce, MD Electrophysiologist: Donnice DELENA Primus, MD      History of Present Illness:   Nathan [name] is a 58 y.o. male with h/o chronic systolic CHF, MR, VT s/p ICD, cardiac sarcoidosis, and peripheral focal chorioretinal inflammation seen today for routine electrophysiology followup.   Since last being seen in our clinic the patient reports doing well overall. Denies undue SOB. No chest pain, syncope, or edema.  Taking all medications as prescribed. Remotes up to date. Pending hernia surgery.   Review of systems complete and found to be negative unless listed in HPI.   EP Information / Studies Reviewed:    EKG is ordered today. Personal review as below.  EKG Interpretation Date/Time:  Tuesday December 14 2023 09:03:20 EST Ventricular Rate:  85 PR Interval:  164 QRS Duration:  108 QT Interval:  402 QTC Calculation: 478 R Axis:   71  Text Interpretation: Sinus rhythm with Premature atrial complexes with Abberant conduction When compared with ECG of 06-May-2023 08:56, Abberant conduction is now Present Confirmed by Lesia Sharper [phone]) on 12/14/2023 9:09:50 AM    ICD Interrogation-  reviewed in detail today,  See PACEART report.  Arrhythmia/Device History Biotronik Single Chamber ICD implanted 2022 for CHF and sarcoidosis      Physical Exam:   VS:  BP 124/70   Pulse 85   Ht 5' 8 (1.727 m)   Wt 191 lb 6.4 oz (86.8 kg)   SpO2 99%   BMI 29.10 kg/m    Wt Readings from Last 3 Encounters:  12/14/23 191 lb 6.4 oz (86.8 kg)  11/12/23 190 lb 12.8 oz (86.5 kg)  10/26/23 195 lb 9.6 oz (88.7 kg)     GEN: No acute distress  NECK: No JVD; No carotid bruits CARDIAC: Regular rate and rhythm, no murmurs, rubs, gallops RESPIRATORY:  Clear to auscultation without rales, wheezing or rhonchi  ABDOMEN: Soft, non-tender, non-distended EXTREMITIES:  No edema; No  deformity   ASSESSMENT AND PLAN:    Chronic systolic CHF  s/p Biotronik single chamber ICD  Cardiac sarcoidosis euvolemic today Stable on an appropriate medical regimen Normal ICD function See Pace Art report No changes today  H/o VT Continue amiodarone  200mg  Mon-Friday only. Surveillance labs today  CKD stage III Baseline 1.4-1.8  Cardiac Clearance for Inguinal Hernia Surgery Stable from a device perspective. Not dependent. No recent therapies.  With EF<20%, will defer cardiac clearance to HF team which he is due to see.  Suspect at LEAST Moderate risk, but not likely to be prohibitive.   Disposition:   Follow up with EP Team in 6 months for amio follow up.    Signed, Sharper Prentice Lesia, PA-C

## 2023-12-15 ENCOUNTER — Ambulatory Visit: Payer: Self-pay | Admitting: Student

## 2023-12-15 LAB — COMPREHENSIVE METABOLIC PANEL WITH GFR
ALT: 9 IU/L (ref 0–44)
AST: 18 IU/L (ref 0–40)
Albumin: 3.9 g/dL (ref 3.8–4.9)
Alkaline Phosphatase: 60 IU/L (ref 47–123)
BUN/Creatinine Ratio: 10 (ref 9–20)
BUN: 15 mg/dL (ref 6–24)
Bilirubin Total: 0.4 mg/dL (ref 0.0–1.2)
CO2: 26 mmol/L (ref 20–29)
Calcium: 9 mg/dL (ref 8.7–10.2)
Chloride: 107 mmol/L — ABNORMAL HIGH (ref 96–106)
Creatinine, Ser: 1.54 mg/dL — ABNORMAL HIGH (ref 0.76–1.27)
Globulin, Total: 2.3 g/dL (ref 1.5–4.5)
Glucose: 64 mg/dL — ABNORMAL LOW (ref 70–99)
Potassium: 4.8 mmol/L (ref 3.5–5.2)
Sodium: 144 mmol/L (ref 134–144)
Total Protein: 6.2 g/dL (ref 6.0–8.5)
eGFR: 53 mL/min/1.73 — ABNORMAL LOW (ref >59)

## 2023-12-15 LAB — TSH: TSH: 0.413 u[IU]/mL — ABNORMAL LOW (ref 0.450–4.500)

## 2023-12-15 LAB — T4, FREE: Free T4: 1.42 ng/dL (ref 0.82–1.77)

## 2023-12-15 MED ORDER — SILDENAFIL CITRATE 20 MG PO TABS: 40.0000 mg | ORAL_TABLET | ORAL | 0 refills | Status: AC | PRN

## 2023-12-16 ENCOUNTER — Telehealth (HOSPITAL_COMMUNITY): Payer: Self-pay

## 2023-12-16 NOTE — Progress Notes (Signed)
 We still receive paper faxes from pharmacies of certain medications.

## 2023-12-16 NOTE — Telephone Encounter (Signed)
 Called to confirm/remind patient of their appointment at the [facility] on 12/17/23 11:00.   Appointment:   [x] Confirmed  [] Left mess   [] No answer/No voice mail  [] VM Full/unable to leave message  [] Phone not in service  Patient reminded to bring all medications and/or complete list.  Confirmed patient has transportation. Gave directions, instructed to utilize valet parking.

## 2023-12-16 NOTE — Progress Notes (Incomplete)
 [facility] Note   PCP: [place], Quintin PARAS, MD PCP-Cardiologist: Annabella Scarce, MD  HF Cardiologist: Dr. Cherrie  HPI: [name] is a 58 y.o. male with history of chronic systolic heart failure, mitral regurgitation, VT s/p ICD, cardiac sarcoidosis and peripheral focal chorioretinal inflammation of both eyes diagnosed in 2020 (followed at [facility] ophthalmology).     Admitted 10/12/20 with SOB. CT chest with bulky mediastinal lymphadenopathy. Pulmonary consulted, underwent bronchoscopy + biopsy. Path showed no malignancy. Echo showed EF 25 % with reduced RV. Underwent R/LHC with normal cors and stable hemodynamics. cMRI that showed EF 18% with LGE pattern consistent with cardiac sarcoidosis. High PVC burden and started on mexilitene 200 mg bid. Discharged with Life Vest due to frequent PVCs.   Re-admitted 10/25/20 after LifeVest alarmed several times. Had frequent runs of NSVT and > 25% PVC burden despite mexilitene.EP consulted and d/t extensive transmural LGE and severely reduced LVEF, ICD placed for primary prevention. Had hypotension with amiodarone  gtt and was started on midodrine . GDMT limited by hypotension. Discharge weight 211-215 lbs  Echo 02/07/21 EF 30-35% RV normal.   PET 9/23: segments of hypermetabolic activity suggestive of an active inflammatory process in the LV. LVEF 26%. Approximately 5% of the left ventricle is involved with predominantly mild/moderate hypermetabolic activity. There is no evidence of abnormal metabolism in the right ventricle. There are extensive areas of extracardiac hypermetabolic activity noted on the limited field of view, specifically bulky bilateral hilar and mediastinal lymph nodes. If clinically indicated/concern for systemic disease, a whole body PET/CT may be obtained for further evaluation.   Follow up 12/23, off pred and on MTX 20 mg per week.  Echo 12/24 EF < 20% RV mildly reduced  Now on methotrexate  20 qweek and  Infiximab q8 weeks.   Repeat PET 03/11/23: EF 19% severely dilated LV. No sarcoid activity   He returns today for heart failure follow up. Overall feeling well. NYHA I. Only gets short of breath with extensive activity like cutting the grass Denies chest pain, dyspnea, fatigue, palpitations, and dizziness. Able to perform ADLs. Appetite okay.  Compliant with all medications.  Cardiac Studies: - Echo (1/23) EF 30-35%, RV normal. - Echo (9/22): EF 25%, reduced RV. - cMRI (9/22): EF 18% with LGE pattern consistent with cardiac sarcoidosis. 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 PAP 46/19 mm with a mean of 33 mmHg.   PCWP 27 mmHg and LVEDP 30 mmHg.  In the setting of systolic pressures 90/20 mmHg Cardiac Output (Fick) 6.75-3.06.-Index SVR 10.96, PVR 0.9 (Wood units)  Past Medical History:  Diagnosis Date   Allergies    09/16/2020   BPH (benign prostatic hyperplasia)    ED (erectile dysfunction)    GERD (gastroesophageal reflux disease)    Osteoarthritis    Peripheral focal chorioretinal inflammation of both eyes    Retinal vasculitis of both eyes    Sarcoidosis    Current Outpatient Medications  Medication Sig Dispense Refill   amiodarone  (PACERONE ) 200 MG tablet Take 1 tablet (200 mg total) by mouth daily at 12 noon. Monday-Friday only 30 tablet 6   carvedilol  (COREG ) 3.125 MG tablet Take 1 tablet (3.125 mg total) by mouth 2 (two) times daily. 60 tablet 11   cetirizine (ZYRTEC) 10 MG tablet Take 10 mg by mouth as needed.     dapagliflozin  propanediol (FARXIGA ) 10 MG TABS tablet Take 1 tablet (10 mg total) by  mouth daily. 90 tablet 3   diclofenac  Sodium (VOLTAREN ) 1 % GEL Apply 2 g topically 4 (four) times daily as needed. 200 g 1   folic acid  (FOLVITE ) 1 MG tablet Take 2 tablets (2 mg total) by mouth daily. 60 tablet 11   inFLIXimab -axxq (AVSOLA  IV) Inject into the vein. 3mg /kg at Week 0, 2, and 6, then  3mg /kg every 8 weeks thereafter     losartan  (COZAAR ) 25 MG tablet Take 0.5 tablets (12.5 mg total) by mouth daily. 45 tablet 3   metFORMIN  (GLUCOPHAGE -XR) 500 MG 24 hr tablet Take 1 tablet (500 mg total) by mouth daily with evening meal 90 tablet 0   methotrexate  (RHEUMATREX) 2.5 MG tablet Take 8 tablets (20 mg total) by mouth as directed every Wednesday. Caution: Chemotherapy, protect from light. 96 tablet 2   Multiple Vitamin (MULTIVITAMIN ADULT PO) Take by mouth.     omeprazole  (PRILOSEC) 40 MG capsule Take 1 capsule (40 mg total) by mouth every morning. 90 capsule 3   sildenafil  (REVATIO ) 20 MG tablet Take 2-3 tablets (40-60 mg total) by mouth as needed. 50 tablet 0   tamsulosin  (FLOMAX ) 0.4 MG CAPS capsule Take 1 capsule (0.4 mg total) by mouth at bedtime. 90 capsule 1   furosemide  (LASIX ) 20 MG tablet Take 1 tablet (20 mg total) by mouth daily as needed. (Patient not taking: Reported on 12/17/2023) 30 tablet 3   Magnesium  Oxide 250 MG TABS take 1 tablet by mouth every day as needed (Patient not taking: Reported on 12/17/2023) 90 tablet 0   spironolactone  (ALDACTONE ) 25 MG tablet Take 12.5 mg by mouth daily. (Patient not taking: Reported on 12/17/2023)     No current facility-administered medications for this encounter.   No Known Allergies  Social History   Socioeconomic History   Marital status: Married    Spouse name: Not on file   Number of children: Not on file   Years of education: Not on file   Highest education level: Not on file  Occupational History   Not on file  Tobacco Use   Smoking status: Former    Current packs/day: 0.00    Types: Cigarettes    Quit date: 02/02/2010    Years since quitting: 13.8    Passive exposure: Past   Smokeless tobacco: Never  Vaping Use   Vaping status: Never Used  Substance and Sexual Activity   Alcohol use: Yes    Alcohol/week: 1.0 standard drink of alcohol    Types: 1 Cans of beer per week   Drug use: Not Currently   Sexual  activity: Not on file  Other Topics Concern   Not on file  Social History Narrative   Not on file   Social Drivers of Health   Financial Resource Strain: Low Risk  (10/21/2020)   Overall Financial Resource Strain (CARDIA)    Difficulty of Paying Living Expenses: Not very hard  Food Insecurity: No Food Insecurity (10/21/2020)   Hunger Vital Sign    Worried About Running Out of Food in the Last Year: Never true    Ran Out of Food in the Last Year: Never true  Transportation Needs: No Transportation Needs (10/21/2020)   PRAPARE - Administrator, Civil Service (Medical): No    Lack of Transportation (Non-Medical): No  Physical Activity: Not on file  Stress: Not on file  Social Connections: Not on file  Intimate Partner Violence: Not on file   Family History  Problem Relation Age of  Onset   Stroke Mother    Chronic Renal Failure Mother    Dementia Mother    Cancer Father 82   Diabetes Father    Pancreatic cancer Father    BP (!) 108/50   Pulse 81   Ht 5' 8 (1.727 m)   Wt 88.5 kg (195 lb 3.2 oz)   SpO2 99%   BMI 29.68 kg/m   Wt Readings from Last 3 Encounters:  12/17/23 88.5 kg (195 lb 3.2 oz)  12/14/23 86.8 kg (191 lb 6.4 oz)  11/12/23 86.5 kg (190 lb 12.8 oz)   PHYSICAL EXAM: General: Well appearing. No distress  Cardiac: JVP flat. No murmurs Extremities: Warm and dry.  No edema.  Neuro: Alert and oriented x3. Affect pleasant. Moves all extremities without difficulty.  ASSESSMENT & PLAN:  1. Cardiac Sarcoid - CMR 9/22 c/w cardiac sarcoid. - Off prednisone  since 2/23 - Continue folic acid , Vit D + calcium  and PPI. - S/p ICD placement 9/26 - PET 9/23 LVEF 26% Mild FDG uptake in LV. Diffuse extracardiac FDG uptake: acitve systemic sarcoid - Echo 12/24 EF < 20% RV mildly reduced - PET 2/25: EF 19% severely dilated LV. No sarcoid activity   - Sarcoid suppressed - continue methotrexate  20 weekly on Weds and infliximab    2. NSVT/ Frequent PVCs  -  Previously on mexilitene 200 mg bid. Now on amio 200  - S/p Biotronik ICD placement 10/28/20.  - Continue amiodarone  200 mg M-F per EP recs. [place] with Dr. Cindie   3. Chronic HFrEF - NICM, cardiac sarcoid - Echo (9/22): EF 30-35%, RV ok, severe MR - R/LHC (9/22): normal cors, mild to moderate pulmonary hypertension 2/2 MR, preserved CO/CI. - Echo (1/23) EF 30-35% RV normal. - PET scan 9/23  EF 26% w active sarcoid. PET 2/25 with EF 19%, no sarcoid.  - Stable NYHA II-early III. Volume status ok, GDMT limited by orthostasis. - Continue losartan  12.5 mg daily (off Entresto  with low BP). - Continue carvedilol  3.125 mg bid. - Continue Farxiga  10 mg daily. - Refill spiro; start at 12.5 mg daily - Continue Lasix  20 mg PRN, has not needed - Unable to tolerate further GDMT titration - We discussed possible need for advanced therapies down the road - Plan for CPX the first of the year  4. CKD Stage IIIa - SCr baseline 1.4-1.8 - Labs 12/14/23 SCr 1.54 K 4.8    5. Mediastinal Lymphadenopathy - 10/17/20 bronchoscopy + biopsy.  - Path showed no malignancy or evidence of sarcoid.  - PET 9/23 suggestive of diffuse active sarcoid - Follows with Dr. Kassie. Suppressed on current regimen.   6. Pre-operative CV Risk Stratification for Inguinal Hernia Repair - patient is moderate-to-high CV risk for inguinal hernia repair , not prohibitive RCRI score: 1 pts (1.1% risk of cardiac event)   Follow up in 3 month with Dr. Cherrie  Khadir Roam, NP  11:14 AM

## 2023-12-17 ENCOUNTER — Encounter: Payer: Self-pay | Admitting: Pulmonary Disease

## 2023-12-17 ENCOUNTER — Ambulatory Visit (HOSPITAL_COMMUNITY)
Admission: RE | Admit: 2023-12-17 | Discharge: 2023-12-17 | Disposition: A | Discharge status: Home / Self Care | Source: Ambulatory Visit | Attending: Cardiology | Admitting: Cardiology

## 2023-12-17 ENCOUNTER — Encounter (HOSPITAL_COMMUNITY): Payer: Self-pay

## 2023-12-17 ENCOUNTER — Other Ambulatory Visit (HOSPITAL_COMMUNITY): Payer: Self-pay

## 2023-12-17 VITALS — BP 108/50 | HR 81 | Ht 68.0 in | Wt 195.2 lb

## 2023-12-17 DIAGNOSIS — I340 Nonrheumatic mitral (valve) insufficiency: Secondary | ICD-10-CM | POA: Diagnosis not present

## 2023-12-17 DIAGNOSIS — D8685 Sarcoid myocarditis: Secondary | ICD-10-CM

## 2023-12-17 DIAGNOSIS — Z79899 Other long term (current) drug therapy: Secondary | ICD-10-CM | POA: Diagnosis not present

## 2023-12-17 DIAGNOSIS — N1831 Chronic kidney disease, stage 3a: Secondary | ICD-10-CM | POA: Insufficient documentation

## 2023-12-17 DIAGNOSIS — R590 Localized enlarged lymph nodes: Secondary | ICD-10-CM | POA: Insufficient documentation

## 2023-12-17 DIAGNOSIS — I272 Pulmonary hypertension, unspecified: Secondary | ICD-10-CM | POA: Diagnosis not present

## 2023-12-17 DIAGNOSIS — Z7984 Long term (current) use of oral hypoglycemic drugs: Secondary | ICD-10-CM | POA: Insufficient documentation

## 2023-12-17 DIAGNOSIS — I493 Ventricular premature depolarization: Secondary | ICD-10-CM | POA: Insufficient documentation

## 2023-12-17 DIAGNOSIS — Z95810 Presence of automatic (implantable) cardiac defibrillator: Secondary | ICD-10-CM | POA: Diagnosis not present

## 2023-12-17 DIAGNOSIS — Z87891 Personal history of nicotine dependence: Secondary | ICD-10-CM | POA: Diagnosis not present

## 2023-12-17 DIAGNOSIS — I5022 Chronic systolic (congestive) heart failure: Secondary | ICD-10-CM | POA: Diagnosis not present

## 2023-12-17 DIAGNOSIS — D869 Sarcoidosis, unspecified: Secondary | ICD-10-CM | POA: Insufficient documentation

## 2023-12-17 DIAGNOSIS — I517 Cardiomegaly: Secondary | ICD-10-CM | POA: Diagnosis not present

## 2023-12-17 DIAGNOSIS — I472 Ventricular tachycardia, unspecified: Secondary | ICD-10-CM | POA: Insufficient documentation

## 2023-12-17 MED ORDER — FUROSEMIDE 20 MG PO TABS
20.0000 mg | ORAL_TABLET | Freq: Every day | ORAL | 3 refills | Status: AC | PRN
  Filled 2023-12-17: qty 30, 30d supply, fill #0

## 2023-12-17 MED ORDER — SPIRONOLACTONE 25 MG PO TABS
12.5000 mg | ORAL_TABLET | Freq: Every day | ORAL | 3 refills | Status: AC
  Filled 2023-12-17: qty 30, 60d supply, fill #0
  Filled 2024-03-02: qty 30, 60d supply, fill #1

## 2023-12-17 NOTE — Patient Instructions (Signed)
 There has been no changes to your medications.  You are scheduled for a Cardiopulmonary Exercise (CPX) Test as [facility] on: Date:      Time:   Expect to be in the lab for 2 hours. Please plan to arrive 30 minutes prior to your appointment. You may be asked to reschedule your test if you arrive 20 minutes or more after your scheduled appointment time.  Main Campus address: [address] [place], [place] [address] You may arrive to the Main Entrance A or Entrance C (free valet parking is available at both). -Main Entrance A (on [address]) :proceed to admitting for check in -Entrance C (on [employer]): proceed to [employer] parking or under hospital deck parking using this code _________  Check In: [facility] waiting room (1st floor)   General Instructions for the day of the test (Please follow all instructions from your physician): Refrain from ingesting a heavy meal, alcohol, or caffeine or using tobacco products within 2 hours of the test (DO NOT FAST for mare than 8 hours). You may have all other non-alcoholic, non -caffeinated beverage,a light snack (crackers,a piece of fruit, carrot sticks, toast bagel,etc) up to your appointment. Avoid significant exertion or exercise within 24 hours of your test. Be prepared to exercise and sweat. Your clothing should permit freedom of movement and include walking or running shoes. Women bring loose fitting short sleeved blouse.  This evaluation may be fatiguing and you may wish ti have someone accompany you to the assessment to drive you home afterward. Bring a list of your medications with you, including dosage and frequency you take the medications (  I.e.,once per day, twice per day, etc). Take all medications as prescribed, unless noted below or instructed to do so by your physician.  Please do not take the following medications prior to your  CPX:  _________________________________________________  _________________________________________________  Brief description of the test: A brief lung test will be performed. This will involve you taking deep breaths and blowing hard and fast through your mouth. During these , a clip will be on your nose and you will be breathing through a breathing device.   For the exercise portion of the test you will be walking on a treadmill, or riding a stationary bike, to your maximal effor or until symptoms such as chest pain, shortness of breath, leg pain or dizziness limit your exercise. You will be breathing in and out of a breathing device through your mouth (a clip will be on your nose again). Your heart rate, ECG, blood pressure, oxygen saturations, breathing rate and depth, amount of oxygen you consume and amount of carbon dioxide you produce will be measured and monitored throughout the exercise test.  If you need to cancel or reschedule your appointment please call [phone] If you have further questions please call your physician or Damien Nunnery at [phone]  Your physician recommends that you schedule a follow-up appointment in: 3 months ( February 2026) ** PLEASE CALL THE OFFICE IN DECEMBER TO ARRANGE YOUR FOLLOW UP APPOINTMENT.**  If you have any questions or concerns before your next appointment please send us  a message through [place] or call our office at [phone].    TO LEAVE A MESSAGE FOR THE NURSE SELECT OPTION 2, PLEASE LEAVE A MESSAGE INCLUDING: YOUR NAME DATE OF BIRTH CALL BACK NUMBER REASON FOR CALL**this is important as we prioritize the call backs  YOU WILL RECEIVE A CALL BACK THE SAME DAY AS LONG  AS YOU CALL BEFORE 4:00 PM  At the [facility], you and your health needs are our priority. As part of our continuing mission to provide you with exceptional heart care, we have created designated Provider Care Teams. These Care Teams include your primary  Cardiologist (physician) and Advanced Practice Providers (APPs- Physician Assistants and Nurse Practitioners) who all work together to provide you with the care you need, when you need it.   You may see any of the following providers on your designated Care Team at your next follow up: Dr Toribio Fuel Dr Ezra Shuck Dr. Morene Brownie Greig Mosses, NP Caffie Shed, [place] [facility] [place], [place] Beckey Coe, NP Jordan Lee, NP Ellouise Class, NP Tinnie Redman, PharmD Jaun Bash, PharmD   Please be sure to bring in all your medications bottles to every appointment.    Thank you for choosing [facility]

## 2023-12-21 ENCOUNTER — Encounter: Payer: Self-pay | Admitting: Pulmonary Disease

## 2023-12-21 ENCOUNTER — Ambulatory Visit

## 2023-12-21 VITALS — BP 115/74 | HR 75 | Temp 98.4°F | Resp 18 | Ht 68.0 in | Wt 197.4 lb

## 2023-12-21 DIAGNOSIS — D8685 Sarcoid myocarditis: Secondary | ICD-10-CM | POA: Diagnosis not present

## 2023-12-21 DIAGNOSIS — D869 Sarcoidosis, unspecified: Secondary | ICD-10-CM | POA: Diagnosis not present

## 2023-12-21 DIAGNOSIS — Z79899 Other long term (current) drug therapy: Secondary | ICD-10-CM | POA: Diagnosis not present

## 2023-12-21 MED ORDER — METHYLPREDNISOLONE SODIUM SUCC 40 MG IJ SOLR
40.0000 mg | Freq: Once | INTRAMUSCULAR | Status: AC
  Administered 2023-12-21: 40 mg via INTRAVENOUS
  Filled 2023-12-21: qty 1

## 2023-12-21 MED ORDER — ACETAMINOPHEN 325 MG PO TABS
650.0000 mg | ORAL_TABLET | Freq: Once | ORAL | Status: AC
  Administered 2023-12-21: 650 mg via ORAL
  Filled 2023-12-21: qty 2

## 2023-12-21 MED ORDER — SODIUM CHLORIDE 0.9 % IV SOLN
5.0000 mg/kg | Freq: Once | INTRAVENOUS | Status: AC
  Administered 2023-12-21: 400 mg via INTRAVENOUS
  Filled 2023-12-21: qty 40

## 2023-12-21 MED ORDER — DIPHENHYDRAMINE HCL 25 MG PO CAPS
25.0000 mg | ORAL_CAPSULE | Freq: Once | ORAL | Status: AC
  Administered 2023-12-21: 25 mg via ORAL
  Filled 2023-12-21: qty 1

## 2023-12-21 NOTE — Progress Notes (Signed)
 Diagnosis: Cardiac sarcoidosis   Provider:  Lonna Coder MD  Procedure: IV Infusion  IV Type: Peripheral, IV Location: L Antecubital  Avsola  (infliximab -axxq), Dose: 500 mg  Infusion Start Time: 1142  Infusion Stop Time: 1355  Post Infusion IV Care: Peripheral IV Discontinued  Discharge: Condition: Good, Destination: Home . AVS Declined  Performed by:  Maximiano JONELLE Pouch, LPN

## 2023-12-22 ENCOUNTER — Ambulatory Visit (HOSPITAL_BASED_OUTPATIENT_CLINIC_OR_DEPARTMENT_OTHER): Admitting: Pulmonary Disease

## 2023-12-23 NOTE — Telephone Encounter (Signed)
 Requesting office sent a duplicate inquiring if pt has been cleared.

## 2023-12-24 ENCOUNTER — Ambulatory Visit (HOSPITAL_BASED_OUTPATIENT_CLINIC_OR_DEPARTMENT_OTHER): Admitting: Family Medicine

## 2023-12-27 ENCOUNTER — Ambulatory Visit (HOSPITAL_BASED_OUTPATIENT_CLINIC_OR_DEPARTMENT_OTHER): Admitting: Pulmonary Disease

## 2023-12-27 ENCOUNTER — Encounter (HOSPITAL_BASED_OUTPATIENT_CLINIC_OR_DEPARTMENT_OTHER): Payer: Self-pay | Admitting: Pulmonary Disease

## 2023-12-27 VITALS — BP 113/78 | HR 86 | Temp 98.9°F | Ht 68.0 in | Wt 190.5 lb

## 2023-12-27 DIAGNOSIS — D8689 Sarcoidosis of other sites: Secondary | ICD-10-CM

## 2023-12-27 DIAGNOSIS — I340 Nonrheumatic mitral (valve) insufficiency: Secondary | ICD-10-CM

## 2023-12-27 DIAGNOSIS — D860 Sarcoidosis of lung: Secondary | ICD-10-CM

## 2023-12-27 DIAGNOSIS — D849 Immunodeficiency, unspecified: Secondary | ICD-10-CM

## 2023-12-27 DIAGNOSIS — R918 Other nonspecific abnormal finding of lung field: Secondary | ICD-10-CM

## 2023-12-27 DIAGNOSIS — I509 Heart failure, unspecified: Secondary | ICD-10-CM

## 2023-12-27 DIAGNOSIS — I5022 Chronic systolic (congestive) heart failure: Secondary | ICD-10-CM | POA: Diagnosis not present

## 2023-12-27 DIAGNOSIS — I10 Essential (primary) hypertension: Secondary | ICD-10-CM

## 2023-12-27 DIAGNOSIS — Z9225 Personal history of immunosupression therapy: Secondary | ICD-10-CM

## 2023-12-27 DIAGNOSIS — N1832 Chronic kidney disease, stage 3b: Secondary | ICD-10-CM

## 2023-12-27 DIAGNOSIS — Z87891 Personal history of nicotine dependence: Secondary | ICD-10-CM

## 2023-12-27 DIAGNOSIS — G4719 Other hypersomnia: Secondary | ICD-10-CM | POA: Diagnosis not present

## 2023-12-27 LAB — COMPREHENSIVE METABOLIC PANEL WITH GFR
ALT: 16 IU/L (ref 0–44)
AST: 24 IU/L (ref 0–40)
Albumin: 4.3 g/dL (ref 3.8–4.9)
Alkaline Phosphatase: 61 IU/L (ref 47–123)
BUN/Creatinine Ratio: 9 (ref 9–20)
BUN: 14 mg/dL (ref 6–24)
Bilirubin Total: 0.5 mg/dL (ref 0.0–1.2)
CO2: 23 mmol/L (ref 20–29)
Calcium: 9.6 mg/dL (ref 8.7–10.2)
Chloride: 104 mmol/L (ref 96–106)
Creatinine, Ser: 1.48 mg/dL — ABNORMAL HIGH (ref 0.76–1.27)
Globulin, Total: 2.3 g/dL (ref 1.5–4.5)
Glucose: 80 mg/dL (ref 70–99)
Potassium: 4.8 mmol/L (ref 3.5–5.2)
Sodium: 140 mmol/L (ref 134–144)
Total Protein: 6.6 g/dL (ref 6.0–8.5)
eGFR: 55 mL/min/1.73 — ABNORMAL LOW (ref >59)

## 2023-12-27 LAB — CBC WITH DIFFERENTIAL/PLATELET
Basophils Absolute: 0 x10E3/uL (ref 0.0–0.2)
Basos: 1 %
EOS (ABSOLUTE): 0.1 x10E3/uL (ref 0.0–0.4)
Eos: 2 %
Hematocrit: 44.1 % (ref 37.5–51.0)
Hemoglobin: 14 g/dL (ref 13.0–17.7)
Immature Grans (Abs): 0 x10E3/uL (ref 0.0–0.1)
Immature Granulocytes: 0 %
Lymphocytes Absolute: 1.4 x10E3/uL (ref 0.7–3.1)
Lymphs: 30 %
MCH: 30.4 pg (ref 26.6–33.0)
MCHC: 31.7 g/dL (ref 31.5–35.7)
MCV: 96 fL (ref 79–97)
Monocytes Absolute: 0.7 x10E3/uL (ref 0.1–0.9)
Monocytes: 15 %
Neutrophils Absolute: 2.4 x10E3/uL (ref 1.4–7.0)
Neutrophils: 52 %
Platelets: 276 x10E3/uL (ref 150–450)
RBC: 4.61 x10E6/uL (ref 4.14–5.80)
RDW: 16.1 % — ABNORMAL HIGH (ref 11.6–15.4)
WBC: 4.5 x10E3/uL (ref 3.4–10.8)

## 2023-12-27 NOTE — Patient Instructions (Addendum)
--  Continue infliximab  5 mg/kg q2 months and methotrexate  20 mg weekly --Continue folic acid  2 mg daily --ORDER labs: CBC with diff and CMET every 3 months --ORDER echocardiogram

## 2023-12-27 NOTE — Progress Notes (Signed)
 Subjective:   PATIENT ID: Nathan [name] GENDER: male DOB: 11/05/1965, MRN: [id number]  Chief Complaint  Patient presents with   Sarcoidosis    Reason for Visit: Follow-up  [name] is a 58 year old male former smoker with sarcoid with cardiac/pulmonary/ocular involvement, onchronic immunosuppressants, VT s/p ICD, chronic systolic heart failure, HTN, DM2 and osteoarthritis who presents for follow-up.   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 He was then admitted in 10/2020 for new heart failure and cMRI consistent with cardiac sarcoid. Bronchoscopy for mediastinal adenopathy was not diagnostic of sarcoid but was negative for malignancy. He was started on prednisone  and methotrexate . Prednisone  was discontinued in 03/2021 and he continued methotrexate  20 mg weekly. PET/CT sarcoid protocol 10/27/21 at [facility] demonstrated 5% cardiac involvement as well as diffuse pulmonary involvement. He was restarted on prednisone  until he could be scheduled for pulmonary consultation.  05/07/22 He reports his symptoms are well controlled on prednisone  and methotrexate . Though after restarting prednisone  he is unsure if it has provided benefit and only taking it due to the abnormal PET/CT. Denies shortness of breath, cough, wheezing, chest pain, fatigue.  05/21/22 Since our last visit continues to have visual blurriness. Has not been contacted by Nephrology. Prefers to go to local Ophthalmologist and requesting referral. Compliant with prednisone  and methotrexate . Denies any respiratory complaints  08/19/22 Since our last visit he has been started on infliximab  06/04/22. Has received total of 3 doses. Has tolerated it well with no complaints. Has been on methotrexate  20 mg weekly and prednisone  20 mg daily. Compliant with bactrim  and PPI. Denies shortness of breath,  cough, wheezing, chest pain, lower extremity edema.  11/11/22 Since our last visit he is tolerating infliximab  and methotrexate . He is down to prednisone  5 mg daily.  02/24/23 Since our last visit he has been weaned off prednisone  since October. Has been taking methotrexate  20 mg weekly and infliximab  every 2 months. Has been on infliximab  for >6 months. Denies shortness of breath, cough, wheezing, lower extremity edema or chest pain.  05/25/23 He is compliant on methotrexate , infliximab  and folic acid . Tolerating medications well with side effects. Infliximab  was increased to 5 mg/kg 02/24/23 due to depressed EF on 02/02/24 echocardiogram. Follow-up cardiac PET on 2/25 with no evidence of cardiac sarcoid however EF remains depressed. Also lung nodule was noted to be 13x 9mm found and PET/CT ordered. Denies shortness of breath, cough or wheezing. Trying to go to the gym.  12/27/23 Since our last visit he has compliant with with methotrexate  and infliximab . Weight has been stable at 190 lbs. Not drinking sodas anymore. Eating 2 meals a day. Denies shortness of breath, cough or wheezing. Drinking herbal teas.   Social History: Quit smoking 2000s. Social smoker. Quit in 2012 Cowboy boots/Shoe fan  Past Medical History:  Diagnosis Date   Allergies    09/16/2020   BPH (benign prostatic hyperplasia)    ED (erectile dysfunction)    GERD (gastroesophageal reflux disease)    Osteoarthritis    Peripheral focal chorioretinal inflammation of both eyes    Retinal vasculitis of both eyes    Sarcoidosis      Family History  Problem Relation Age of Onset   Stroke Mother    Chronic Renal Failure Mother    Dementia Mother    Cancer Father 83   Diabetes Father  Pancreatic cancer Father      Social History   Occupational History   Not on file  Tobacco Use   Smoking status: Former    Current packs/day: 0.00    Types: Cigarettes    Quit date: 02/02/2010    Years since quitting: 13.9     Passive exposure: Past   Smokeless tobacco: Never  Vaping Use   Vaping status: Never Used  Substance and Sexual Activity   Alcohol use: Yes    Alcohol/week: 1.0 standard drink of alcohol    Types: 1 Cans of beer per week   Drug use: Not Currently   Sexual activity: Not on file    No Known Allergies   Outpatient Medications Prior to Visit  Medication Sig Dispense Refill   amiodarone  (PACERONE ) 200 MG tablet Take 1 tablet (200 mg total) by mouth daily at 12 noon. Monday-Friday only 30 tablet 6   carvedilol  (COREG ) 3.125 MG tablet Take 1 tablet (3.125 mg total) by mouth 2 (two) times daily. 60 tablet 11   cetirizine (ZYRTEC) 10 MG tablet Take 10 mg by mouth as needed.     dapagliflozin  propanediol (FARXIGA ) 10 MG TABS tablet Take 1 tablet (10 mg total) by mouth daily. 90 tablet 3   diclofenac  Sodium (VOLTAREN ) 1 % GEL Apply 2 g topically 4 (four) times daily as needed. 200 g 1   folic acid  (FOLVITE ) 1 MG tablet Take 2 tablets (2 mg total) by mouth daily. 60 tablet 11   furosemide  (LASIX ) 20 MG tablet Take 1 tablet (20 mg total) by mouth daily as needed. 30 tablet 3   losartan  (COZAAR ) 25 MG tablet Take 0.5 tablets (12.5 mg total) by mouth daily. 45 tablet 3   methotrexate  (RHEUMATREX) 2.5 MG tablet Take 8 tablets (20 mg total) by mouth as directed every Wednesday. Caution: Chemotherapy, protect from light. 96 tablet 2   Multiple Vitamin (MULTIVITAMIN ADULT PO) Take by mouth.     omeprazole  (PRILOSEC) 40 MG capsule Take 1 capsule (40 mg total) by mouth every morning. 90 capsule 3   sildenafil  (REVATIO ) 20 MG tablet Take 2-3 tablets (40-60 mg total) by mouth as needed. 50 tablet 0   spironolactone  (ALDACTONE ) 25 MG tablet Take 1/2 tablets (12.5 mg total) by mouth daily. 45 tablet 3   tamsulosin  (FLOMAX ) 0.4 MG CAPS capsule Take 1 capsule (0.4 mg total) by mouth at bedtime. 90 capsule 1   inFLIXimab -axxq (AVSOLA  IV) Inject into the vein. 3mg /kg at Week 0, 2, and 6, then 3mg /kg every 8 weeks  thereafter     Magnesium  Oxide 250 MG TABS take 1 tablet by mouth every day as needed (Patient not taking: Reported on 12/27/2023) 90 tablet 0   metFORMIN  (GLUCOPHAGE -XR) 500 MG 24 hr tablet Take 1 tablet (500 mg total) by mouth daily with evening meal (Patient not taking: Reported on 12/27/2023) 90 tablet 0   No facility-administered medications prior to visit.    Review of Systems  Constitutional:  Negative for chills, diaphoresis, fever, malaise/fatigue and weight loss.  HENT:  Negative for congestion.   Respiratory:  Negative for cough, hemoptysis, sputum production, shortness of breath and wheezing.   Cardiovascular:  Negative for chest pain, palpitations and leg swelling.     Objective:   Vitals:   12/27/23 0835 12/27/23 0840  BP: (!) 177/77 113/78  Pulse: 86   Temp: 98.9 F (37.2 C)   SpO2: 99%   Weight: 190 lb 8 oz (86.4 kg)   Height: 5'  8 (1.727 m)    SpO2: 99 %  Physical Exam: General: Well-appearing, no acute distress HENT: [place], AT Eyes: EOMI, no scleral icterus Respiratory: Clear to auscultation bilaterally.  No crackles, wheezing or rales Cardiovascular: RRR, -M/R/G, no JVD Extremities:-Edema,-tenderness Neuro: AAO x4, CNII-XII grossly intact Psych: Normal mood, normal affect  Data Reviewed:  Imaging:  Cardiac PET/CT 10/27/21 metabolic study with F-18 FDG and scanned on PET [facility]:   1. There are few segments with hypermetabolic activity suggestive of an active inflammatory process in the left ventricular       myocardium.   2. Approximately 5% of the left ventricle is involved with predominantly mild/moderate hypermetabolic activity.   3. There is no evidence of abnormal metabolism in the right ventricle.   4. In the appropriate clinical context with positive biomarkers, these findings may represent active cardiac sarcoidosis.    Perfusion/Function.    Gated cardiac PET/CT rest myocardial perfusion study with [id number] demonstrates:   1. Abnormal  myocardial perfusion in a non-CAD pattern.   2. Severe dilated left ventricle with severe left ventricular systolic dysfunction.    Non-diagnostic limited  CT obtained for PET attenuation correction:   1. There are no discernible coronary artery calcifications.   2. There are extensive areas of extracardiac hypermetabolic activity noted on the limited field of view, specifically bulky        bilateral hilar and mediastinal lymph nodes. If clinically indicated/concern for systemic disease, a whole body   Cardiac PET 03/11/23 IMPRESSION: 1. 13 x 9 mm subpleural nodule paraspinal right lower lobe. This region of the lung was obscured by atelectasis and effusion on the prior imaging study. Consider one of the following in 3 months for both low-risk and high-risk individuals: (a) repeat chest CT, (b) follow-up PET-CT, or (c) tissue sampling.  PET/CT 05/21/23 - Right lower lobe nodule with no abnormal uptake CT Chest 08/19/23 - New 6 mm pleural LLL nodule, prior RLL nodule 1.6 x0.9 cm, stable mediastinal adenopathy CT Chest 11/24/23 - Unchanged 6 mm LLL lung nodule. Prior RLL nodule 1.6 x 0.9 cm, now 1.3 x 4mm with more bandlike. Unchanged mediastinal adenopathy.   PFT: 05/12/22 FVC 4.09 (83%) FEV1 3.50 (92%) Ratio 86  TLC 87% DLCO 77% Interpretation: Normal PFTs  Labs:    Latest Ref Rng & Units 09/24/2023    9:27 AM 05/25/2023    9:46 AM 02/24/2023    9:15 AM  CBC  WBC 3.4 - 10.8 x10E3/uL 4.2  4.1  4.5   Hemoglobin 13.0 - 17.7 g/dL 86.7  86.6  87.6   Hematocrit 37.5 - 51.0 % 41.6  40.0  39.9   Platelets 150 - 450 x10E3/uL 293  274  255       Latest Ref Rng & Units 12/14/2023    9:35 AM 09/24/2023    9:27 AM 05/25/2023    9:46 AM  BMP  Glucose 70 - 99 mg/dL 64  46  94   BUN 6 - 24 mg/dL 15  16  12    Creatinine 0.76 - 1.27 mg/dL 8.45  8.46  8.58   BUN/Creat Ratio 9 - 20 10  10  9    Sodium 134 - 144 mmol/L 144  147  140   Potassium 3.5 - 5.2 mmol/L 4.8  4.3  4.6   Chloride 96 - 106  mmol/L 107  109  104   CO2 20 - 29 mmol/L 26  23  23    Calcium  8.7 - 10.2 mg/dL  9.0  9.3  9.4       Latest Ref Rng & Units 12/14/2023    9:35 AM 09/24/2023    9:27 AM 05/25/2023    9:46 AM  Hepatic Function  Total Protein 6.0 - 8.5 g/dL 6.2  6.5  6.2   Albumin  3.8 - 4.9 g/dL 3.9  4.2  4.2   AST 0 - 40 IU/L 18  20  25    ALT 0 - 44 IU/L 9  12  16    Alk Phosphatase 47 - 123 IU/L 60  62  68   Total Bilirubin 0.0 - 1.2 mg/dL 0.4  0.5  0.6    Bronchoscopy 10/18/20 [facility] - no malignancy     Assessment & Plan:   Discussion: 58 year old male former smoker with sarcoid with cardiac/pulmonary/ocular involvement, on chronic immunosuppressants, VT s/p ICD, chronic systolic heart failure, HTN and DM2 who presents for follow-up of RLL pulmonary nodule incidentally seen on Cardiac PET. PET/CT reviewed and no hypermetabolic activity. Recommend 3 month CT for surveillance at next visit   On infliximab  and methotrexate  for sarcoid flare. Off prednisone  since 11/2022. Reviewed 01/2023 echocardiogram with worsening EF so inflixmab was increased to 5 mg/kg. From a cardiac standpoint, compensated heart failure. Cardiac PET neg for active sarcoid. Patient currently following Heart Failure clinic.  Continue current immunosuppresion with plan to de-escalate in the future if echo and CPET is favorable/stable.  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 On amio. Referred to Pulm 05/2022 for multi-organ involvement --MR Cardiac 10/17/20 - Severe LV  dysfunction with LGE pattern, bilateral pleural effusions --S/p ICD 10/28/20 --Echo 02/07/21 EF 30-35% --Echo 02/02/23 <20% after 6 months of infliximab  --Cardiac PET 03/11/23 - No evidence of active cardiac sarcoid --Heart failure team following and planning for repeat CPET in 02/2024 --ORDER echocardiogram  Peripheral focal chorioretinal inflammation of both eyes --Diagnosed in 2020 at [facility] [place] --Last seen 02/07/19 with Dr. Maree --Last optho visit 10/26/22 Ophthalmologist in the [place] area - no active sarcoid per patient  History of immunosuppression High risk medication management --Methotrexate  02/2018-2021 for ocular sarcoid --Methotrexate  Started 10/2020 > on 20 mg weekly dosing since Spring 2023> currently on --Prednisone  20 mg daily 04/28/22>08/19/22 --Off prednisone  by end of 11/2022 --Infliximab  started 3 mg/kg on 06/04/22 --Infliximab  increased to 5 mg/kg on 03/15/23 --Continue infliximab  5 mg/kg q2 months and methotrexate  20 mg weekly --Continue folic acid  2 mg daily --ORDER labs: CBC with diff and CMET every 3 months --Will consider reducing methotrexate  at next visit if echo +/- CPET stable  Sarcoid Monitoring --Recent chest imaging reviewed.  --Annual PFTs. Normal on 05/21/22. --Annual ophthalmology exam.  Last visit on  10/26/22  --Recent EKG and TTE reviewed as above --Routine labs as needed: CBC with diff, CMET, 1, 25 and 25 hydroxy vitamin D , urinary calcium   RLL lung nodule - decreased in size LLL lung nodule - stable --PET/CT 05/2023 with no hypermetabolic activity --Reviewed CT Chest 11/27/23. Stable/improved nodule  CKD IIIB --Established with Nephrology with Dr. Jerrye  Hypertension --Elevated in visit today --Patient did not take AM  medicine. Will resume after visit  Health Maintenance Immunization History  Administered Date(s) Administered   Influenza, Seasonal, Injecte, Preservative Fre 11/11/2022, 11/12/2023   Influenza,inj,Quad PF,6+ Mos  10/15/2020   PFIZER(Purple Top)SARS-COV-2 Vaccination 07/06/2019, 07/27/2019, 02/15/2020   Pfizer Covid-19 Vaccine Bivalent Booster 2yrs & up 10/21/2020   CT Lung Screen - not qualified. Insufficient tobacco history  Orders Placed This Encounter  Procedures   CBC with Differential   Comp Met (CMET)   ECHOCARDIOGRAM COMPLETE    Standing Status:   Future    Expiration Date:   12/26/2024    Where should this test be performed:   [facility]    Perflutren DEFINITY (image enhancing agent) should be administered unless hypersensitivity or allergy exist:   Administer Perflutren    Reason for exam-Echo:   Congestive Heart Failure  I50.9   No orders of the defined types were placed in this encounter.   Return in about 3 months (around 03/28/2024).  I have spent a total time of 42-minutes on the day of the appointment including chart review, data review, collecting history, coordinating care and discussing medical diagnosis and plan with the patient/family. Past medical history, allergies, medications were reviewed. Pertinent imaging, labs and tests included in this note have been reviewed and interpreted independently by me   Julius Boniface Slater Staff, MD [facility] 12/27/2023 9:01 AM

## 2023-12-28 ENCOUNTER — Ambulatory Visit: Payer: Self-pay | Admitting: Pulmonary Disease

## 2023-12-28 DIAGNOSIS — G4719 Other hypersomnia: Secondary | ICD-10-CM | POA: Diagnosis not present

## 2024-01-13 ENCOUNTER — Telehealth: Payer: Self-pay | Admitting: Pharmacy Technician

## 2024-01-13 NOTE — Telephone Encounter (Addendum)
 Auth Submission: APPROVED Site of care: Site of care: [facility] Payer: [employer] [place] Medication & CPT/J Code(s) submitted: Avsola  (infliximab -axxq) V4878 Diagnosis Code: D86.85 Route of submission (phone, fax, portal): LATENT Phone # Fax # Auth type: Buy/Bill PB Units/visits requested: 400MG  [id number] Reference number: [id number] Approval from:  02/03/24- 02/01/26

## 2024-01-22 ENCOUNTER — Other Ambulatory Visit (HOSPITAL_COMMUNITY): Payer: Self-pay | Admitting: Internal Medicine

## 2024-01-22 ENCOUNTER — Other Ambulatory Visit (HOSPITAL_COMMUNITY): Payer: Self-pay

## 2024-01-22 ENCOUNTER — Encounter: Payer: Self-pay | Admitting: Pulmonary Disease

## 2024-01-24 ENCOUNTER — Other Ambulatory Visit (HOSPITAL_COMMUNITY): Payer: Self-pay

## 2024-01-24 ENCOUNTER — Other Ambulatory Visit: Payer: Self-pay

## 2024-01-24 DIAGNOSIS — G4733 Obstructive sleep apnea (adult) (pediatric): Secondary | ICD-10-CM | POA: Diagnosis not present

## 2024-01-24 MED ORDER — DAPAGLIFLOZIN PROPANEDIOL 10 MG PO TABS
10.0000 mg | ORAL_TABLET | Freq: Every day | ORAL | 3 refills | Status: AC
  Filled 2024-01-24: qty 30, 30d supply, fill #0
  Filled 2024-01-24: qty 90, 90d supply, fill #0

## 2024-01-25 ENCOUNTER — Ambulatory Visit: Payer: 59

## 2024-01-25 DIAGNOSIS — I5022 Chronic systolic (congestive) heart failure: Secondary | ICD-10-CM

## 2024-01-25 LAB — CUP PACEART REMOTE DEVICE CHECK
Date Time Interrogation Session: 20251223144513
Implantable Lead Connection Status: 753985
Implantable Lead Implant Date: 20220926
Implantable Lead Location: 753860
Implantable Lead Model: 436910
Implantable Lead Serial Number: 81470915
Implantable Pulse Generator Implant Date: 20220926
Pulse Gen Model: 429525
Pulse Gen Serial Number: 84847461

## 2024-01-26 ENCOUNTER — Other Ambulatory Visit (HOSPITAL_COMMUNITY): Payer: Self-pay

## 2024-01-26 NOTE — Progress Notes (Signed)
 Remote ICD Transmission

## 2024-01-28 ENCOUNTER — Ambulatory Visit: Payer: Self-pay | Admitting: Student in an Organized Health Care Education/Training Program

## 2024-01-28 ENCOUNTER — Encounter: Payer: Self-pay | Admitting: Pulmonary Disease

## 2024-01-31 ENCOUNTER — Other Ambulatory Visit (HOSPITAL_COMMUNITY): Payer: Self-pay

## 2024-01-31 ENCOUNTER — Encounter: Payer: Self-pay | Admitting: Pulmonary Disease

## 2024-01-31 ENCOUNTER — Telehealth (HOSPITAL_COMMUNITY): Payer: Self-pay

## 2024-01-31 NOTE — Telephone Encounter (Signed)
 Advanced Heart Failure Patient Advocate Encounter  Received notification that prior authorization is needed for Farxiga . Attempt to submit prior authorization returns an error that this coverage is future dated for 2026.  I will test claim and submit prior authorization if needed after the new year.  Rachel DEL, CPhT Rx Patient Advocate Phone: [phone]

## 2024-02-03 ENCOUNTER — Ambulatory Visit: Payer: Self-pay | Admitting: Primary Care

## 2024-02-04 ENCOUNTER — Encounter: Payer: Self-pay | Admitting: Pulmonary Disease

## 2024-02-04 ENCOUNTER — Other Ambulatory Visit (HOSPITAL_COMMUNITY): Payer: Self-pay

## 2024-02-07 ENCOUNTER — Other Ambulatory Visit (HOSPITAL_COMMUNITY): Payer: Self-pay

## 2024-02-07 ENCOUNTER — Ambulatory Visit (HOSPITAL_COMMUNITY): Attending: Internal Medicine

## 2024-02-07 ENCOUNTER — Encounter: Payer: Self-pay | Admitting: Pulmonary Disease

## 2024-02-07 DIAGNOSIS — J90 Pleural effusion, not elsewhere classified: Secondary | ICD-10-CM | POA: Insufficient documentation

## 2024-02-07 DIAGNOSIS — I5042 Chronic combined systolic (congestive) and diastolic (congestive) heart failure: Secondary | ICD-10-CM | POA: Diagnosis present

## 2024-02-07 DIAGNOSIS — R0609 Other forms of dyspnea: Secondary | ICD-10-CM | POA: Diagnosis not present

## 2024-02-07 DIAGNOSIS — Z87891 Personal history of nicotine dependence: Secondary | ICD-10-CM | POA: Insufficient documentation

## 2024-02-07 DIAGNOSIS — I340 Nonrheumatic mitral (valve) insufficiency: Secondary | ICD-10-CM | POA: Insufficient documentation

## 2024-02-07 DIAGNOSIS — D8685 Sarcoid myocarditis: Secondary | ICD-10-CM | POA: Insufficient documentation

## 2024-02-07 DIAGNOSIS — R5383 Other fatigue: Secondary | ICD-10-CM | POA: Insufficient documentation

## 2024-02-07 DIAGNOSIS — I4729 Other ventricular tachycardia: Secondary | ICD-10-CM | POA: Diagnosis not present

## 2024-02-07 DIAGNOSIS — Z95810 Presence of automatic (implantable) cardiac defibrillator: Secondary | ICD-10-CM | POA: Insufficient documentation

## 2024-02-07 DIAGNOSIS — I5022 Chronic systolic (congestive) heart failure: Secondary | ICD-10-CM

## 2024-02-07 DIAGNOSIS — I5043 Acute on chronic combined systolic (congestive) and diastolic (congestive) heart failure: Secondary | ICD-10-CM | POA: Insufficient documentation

## 2024-02-07 NOTE — Telephone Encounter (Signed)
 Test billing returns $195 for 30 day supply of Jardiance. Prior authorization not needed at this time, patient is eligible to use copay savings card to reduce cost of this medication.

## 2024-02-09 ENCOUNTER — Other Ambulatory Visit (INDEPENDENT_AMBULATORY_CARE_PROVIDER_SITE_OTHER)

## 2024-02-09 DIAGNOSIS — I509 Heart failure, unspecified: Secondary | ICD-10-CM

## 2024-02-09 LAB — ECHOCARDIOGRAM COMPLETE
AR max vel: 1.53 cm2
AV Area VTI: 1.56 cm2
AV Area mean vel: 1.44 cm2
AV Mean grad: 3 mmHg
AV Peak grad: 5.8 mmHg
Ao pk vel: 1.21 m/s
Area-P 1/2: 5.54 cm2
MV M vel: 4.51 m/s
MV Peak grad: 81.4 mmHg
Radius: 0.6 cm
S' Lateral: 7.03 cm

## 2024-02-10 NOTE — Progress Notes (Signed)
 I have it canceled. Thanks!

## 2024-02-21 ENCOUNTER — Ambulatory Visit

## 2024-03-10 ENCOUNTER — Other Ambulatory Visit (HOSPITAL_COMMUNITY): Payer: Self-pay

## 2024-03-28 ENCOUNTER — Ambulatory Visit (HOSPITAL_BASED_OUTPATIENT_CLINIC_OR_DEPARTMENT_OTHER): Admitting: Pulmonary Disease

## 2024-05-04 ENCOUNTER — Ambulatory Visit (HOSPITAL_BASED_OUTPATIENT_CLINIC_OR_DEPARTMENT_OTHER)

## 2024-05-05 ENCOUNTER — Ambulatory Visit (HOSPITAL_BASED_OUTPATIENT_CLINIC_OR_DEPARTMENT_OTHER)

## 2024-05-12 ENCOUNTER — Encounter (HOSPITAL_BASED_OUTPATIENT_CLINIC_OR_DEPARTMENT_OTHER): Admitting: Family Medicine
